# Patient Record
Sex: Female | Born: 1960 | Race: Black or African American | Hispanic: No | Marital: Married | Smoking: Former smoker
Health system: Southern US, Community
[De-identification: ages and names within clinical notes are randomized; demographics above are authoritative.]

## PROBLEM LIST (undated history)

## (undated) DIAGNOSIS — K2270 Barrett's esophagus without dysplasia: Secondary | ICD-10-CM

## (undated) DIAGNOSIS — E039 Hypothyroidism, unspecified: Secondary | ICD-10-CM

## (undated) DIAGNOSIS — F329 Major depressive disorder, single episode, unspecified: Secondary | ICD-10-CM

## (undated) DIAGNOSIS — F32A Depression, unspecified: Secondary | ICD-10-CM

## (undated) DIAGNOSIS — K219 Gastro-esophageal reflux disease without esophagitis: Secondary | ICD-10-CM

## (undated) DIAGNOSIS — I10 Essential (primary) hypertension: Secondary | ICD-10-CM

## (undated) DIAGNOSIS — N938 Other specified abnormal uterine and vaginal bleeding: Secondary | ICD-10-CM

## (undated) DIAGNOSIS — D649 Anemia, unspecified: Secondary | ICD-10-CM

## (undated) DIAGNOSIS — E0500 Thyrotoxicosis with diffuse goiter without thyrotoxic crisis or storm: Secondary | ICD-10-CM

## (undated) HISTORY — DX: Other specified abnormal uterine and vaginal bleeding: N93.8

## (undated) HISTORY — DX: Major depressive disorder, single episode, unspecified: F32.9

## (undated) HISTORY — DX: Thyrotoxicosis with diffuse goiter without thyrotoxic crisis or storm: E05.00

## (undated) HISTORY — DX: Essential (primary) hypertension: I10

## (undated) HISTORY — DX: Hypothyroidism, unspecified: E03.9

## (undated) HISTORY — DX: Anemia, unspecified: D64.9

## (undated) HISTORY — PX: COLONOSCOPY: SHX174

## (undated) HISTORY — PX: UPPER GASTROINTESTINAL ENDOSCOPY: SHX188

## (undated) HISTORY — DX: Depression, unspecified: F32.A

## (undated) HISTORY — PX: FOOT SURGERY: SHX648

## (undated) HISTORY — DX: Barrett's esophagus without dysplasia: K22.70

## (undated) HISTORY — DX: Gastro-esophageal reflux disease without esophagitis: K21.9

## (undated) HISTORY — PX: OTHER SURGICAL HISTORY: SHX169

---

## 1998-12-19 ENCOUNTER — Encounter: Payer: Self-pay | Admitting: Obstetrics and Gynecology

## 1998-12-19 ENCOUNTER — Encounter: Admission: RE | Admit: 1998-12-19 | Discharge: 1998-12-19 | Payer: Self-pay | Admitting: Obstetrics and Gynecology

## 1999-09-17 ENCOUNTER — Encounter: Admission: RE | Admit: 1999-09-17 | Discharge: 1999-09-17 | Payer: Self-pay | Admitting: Family Medicine

## 1999-09-17 ENCOUNTER — Encounter: Payer: Self-pay | Admitting: Family Medicine

## 1999-12-20 ENCOUNTER — Encounter: Payer: Self-pay | Admitting: Obstetrics and Gynecology

## 1999-12-20 ENCOUNTER — Encounter: Admission: RE | Admit: 1999-12-20 | Discharge: 1999-12-20 | Payer: Self-pay | Admitting: Obstetrics and Gynecology

## 2000-12-23 ENCOUNTER — Encounter: Admission: RE | Admit: 2000-12-23 | Discharge: 2000-12-23 | Payer: Self-pay | Admitting: Obstetrics and Gynecology

## 2000-12-23 ENCOUNTER — Encounter: Payer: Self-pay | Admitting: Obstetrics and Gynecology

## 2001-12-24 ENCOUNTER — Encounter: Admission: RE | Admit: 2001-12-24 | Discharge: 2001-12-24 | Payer: Self-pay | Admitting: Obstetrics and Gynecology

## 2001-12-24 ENCOUNTER — Encounter: Payer: Self-pay | Admitting: Obstetrics and Gynecology

## 2002-06-03 ENCOUNTER — Inpatient Hospital Stay (HOSPITAL_COMMUNITY): Admission: EM | Admit: 2002-06-03 | Discharge: 2002-06-06 | Payer: Self-pay | Admitting: Psychiatry

## 2003-01-19 ENCOUNTER — Encounter: Admission: RE | Admit: 2003-01-19 | Discharge: 2003-01-19 | Payer: Self-pay | Admitting: Obstetrics and Gynecology

## 2004-02-13 ENCOUNTER — Encounter: Admission: RE | Admit: 2004-02-13 | Discharge: 2004-02-13 | Payer: Self-pay | Admitting: Obstetrics and Gynecology

## 2004-02-23 ENCOUNTER — Ambulatory Visit: Payer: Self-pay | Admitting: Family Medicine

## 2004-05-15 ENCOUNTER — Ambulatory Visit: Payer: Self-pay | Admitting: Family Medicine

## 2004-09-14 ENCOUNTER — Ambulatory Visit: Payer: Self-pay | Admitting: Family Medicine

## 2004-09-21 ENCOUNTER — Encounter: Admission: RE | Admit: 2004-09-21 | Discharge: 2004-09-21 | Payer: Self-pay | Admitting: Family Medicine

## 2005-01-10 ENCOUNTER — Ambulatory Visit: Payer: Self-pay | Admitting: Family Medicine

## 2005-02-07 ENCOUNTER — Ambulatory Visit: Payer: Self-pay | Admitting: Family Medicine

## 2005-03-11 ENCOUNTER — Ambulatory Visit: Payer: Self-pay | Admitting: Family Medicine

## 2005-03-18 ENCOUNTER — Ambulatory Visit: Payer: Self-pay | Admitting: Family Medicine

## 2005-05-27 ENCOUNTER — Ambulatory Visit: Payer: Self-pay | Admitting: Family Medicine

## 2005-09-09 ENCOUNTER — Ambulatory Visit: Payer: Self-pay | Admitting: Family Medicine

## 2005-09-30 ENCOUNTER — Encounter: Admission: RE | Admit: 2005-09-30 | Discharge: 2005-09-30 | Payer: Self-pay | Admitting: Obstetrics and Gynecology

## 2006-02-01 ENCOUNTER — Emergency Department (HOSPITAL_COMMUNITY): Admission: EM | Admit: 2006-02-01 | Discharge: 2006-02-01 | Payer: Self-pay | Admitting: Emergency Medicine

## 2006-05-19 ENCOUNTER — Ambulatory Visit: Payer: Self-pay | Admitting: Family Medicine

## 2006-05-19 LAB — CONVERTED CEMR LAB
ALT: 15 units/L (ref 0–40)
AST: 20 units/L (ref 0–37)
Alkaline Phosphatase: 79 units/L (ref 39–117)
BUN: 13 mg/dL (ref 6–23)
Basophils Relative: 0 % (ref 0.0–1.0)
Bilirubin, Direct: 0.1 mg/dL (ref 0.0–0.3)
CO2: 27 meq/L (ref 19–32)
Calcium: 9 mg/dL (ref 8.4–10.5)
Chloride: 108 meq/L (ref 96–112)
Eosinophils Relative: 0.3 % (ref 0.0–5.0)
GFR calc Af Amer: 99 mL/min
Glucose, Bld: 82 mg/dL (ref 70–99)
HDL: 49.1 mg/dL (ref >39.0)
LDL Cholesterol: 94 mg/dL (ref 0–99)
Lymphocytes Relative: 38.5 % (ref 12.0–46.0)
Monocytes Relative: 9.9 % (ref 3.0–11.0)
Platelets: 274 10*3/uL (ref 150–400)
RBC: 3.48 M/uL — ABNORMAL LOW (ref 3.87–5.11)
RDW: 12.9 % (ref 11.5–14.6)
Total CHOL/HDL Ratio: 3.1
Total Protein: 7.4 g/dL (ref 6.0–8.3)
Triglycerides: 47 mg/dL (ref 0–149)
VLDL: 9 mg/dL (ref 0–40)
WBC: 4.4 10*3/uL — ABNORMAL LOW (ref 4.5–10.5)

## 2006-07-07 ENCOUNTER — Ambulatory Visit: Payer: Self-pay | Admitting: Family Medicine

## 2006-07-07 LAB — CONVERTED CEMR LAB
Basophils Absolute: 0 10*3/uL (ref 0.0–0.1)
Eosinophils Absolute: 0 10*3/uL (ref 0.0–0.6)
HCT: 31.5 % — ABNORMAL LOW (ref 36.0–46.0)
Hemoglobin: 10.6 g/dL — ABNORMAL LOW (ref 12.0–15.0)
MCHC: 33.5 g/dL (ref 30.0–36.0)
MCV: 93 fL (ref 78.0–100.0)
Monocytes Absolute: 0.3 10*3/uL (ref 0.2–0.7)
Monocytes Relative: 8 % (ref 3.0–11.0)
Neutrophils Relative %: 45.2 % (ref 43.0–77.0)
RBC: 3.38 M/uL — ABNORMAL LOW (ref 3.87–5.11)
RDW: 12 % (ref 11.5–14.6)

## 2006-07-29 ENCOUNTER — Ambulatory Visit: Payer: Self-pay | Admitting: Hematology & Oncology

## 2006-08-15 LAB — CBC & DIFF AND RETIC
Basophils Absolute: 0 10*3/uL (ref 0.0–0.1)
Eosinophils Absolute: 0 10*3/uL (ref 0.0–0.5)
HCT: 30 % — ABNORMAL LOW (ref 34.8–46.6)
HGB: 10.1 g/dL — ABNORMAL LOW (ref 11.6–15.9)
IRF: 0.39 — ABNORMAL HIGH (ref 0.130–0.330)
NEUT#: 2 10*3/uL (ref 1.5–6.5)
NEUT%: 55.5 % (ref 39.6–76.8)
RDW: 12.6 % (ref 11.3–14.5)
RETIC #: 32.7 10*3/uL (ref 19.7–115.1)
lymph#: 1.2 10*3/uL (ref 0.9–3.3)

## 2006-08-15 LAB — CHCC SMEAR

## 2006-08-18 LAB — COMPREHENSIVE METABOLIC PANEL
ALT: 14 U/L (ref 0–35)
AST: 17 U/L (ref 0–37)
Albumin: 4.2 g/dL (ref 3.5–5.2)
Calcium: 9 mg/dL (ref 8.4–10.5)
Chloride: 103 mEq/L (ref 96–112)
Potassium: 3.5 mEq/L (ref 3.5–5.3)
Sodium: 138 mEq/L (ref 135–145)
Total Protein: 7.2 g/dL (ref 6.0–8.3)

## 2006-08-18 LAB — FERRITIN: Ferritin: 241 ng/mL (ref 10–291)

## 2006-08-18 LAB — TRANSFERRIN RECEPTOR, SOLUABLE: Transferrin Receptor, Soluble: 17.2 nmol/L

## 2006-08-26 ENCOUNTER — Telehealth (INDEPENDENT_AMBULATORY_CARE_PROVIDER_SITE_OTHER): Payer: Self-pay | Admitting: *Deleted

## 2006-09-03 ENCOUNTER — Ambulatory Visit: Payer: Self-pay | Admitting: Hematology & Oncology

## 2006-09-05 LAB — CBC WITH DIFFERENTIAL/PLATELET
BASO%: 0.8 % (ref 0.0–2.0)
Eosinophils Absolute: 0 10*3/uL (ref 0.0–0.5)
MCHC: 32.5 g/dL (ref 32.0–36.0)
MONO#: 0.4 10*3/uL (ref 0.1–0.9)
NEUT#: 1.9 10*3/uL (ref 1.5–6.5)
RBC: 3.83 10*6/uL (ref 3.70–5.32)
RDW: 15.7 % — ABNORMAL HIGH (ref 11.3–14.5)
WBC: 3.7 10*3/uL — ABNORMAL LOW (ref 3.9–10.0)
lymph#: 1.4 10*3/uL (ref 0.9–3.3)

## 2006-09-24 LAB — CBC & DIFF AND RETIC
Basophils Absolute: 0 10*3/uL (ref 0.0–0.1)
EOS%: 1.3 % (ref 0.0–7.0)
Eosinophils Absolute: 0 10*3/uL (ref 0.0–0.5)
HGB: 14.1 g/dL (ref 11.6–15.9)
IRF: 0.3 (ref 0.130–0.330)
MCH: 31.6 pg (ref 26.0–34.0)
MONO#: 0.4 10*3/uL (ref 0.1–0.9)
NEUT#: 1.3 10*3/uL — ABNORMAL LOW (ref 1.5–6.5)
RDW: 15.1 % — ABNORMAL HIGH (ref 11.3–14.5)
RETIC #: 40.2 10*3/uL (ref 19.7–115.1)
WBC: 3.2 10*3/uL — ABNORMAL LOW (ref 3.9–10.0)
lymph#: 1.5 10*3/uL (ref 0.9–3.3)

## 2006-09-24 LAB — CHCC SMEAR

## 2006-10-03 ENCOUNTER — Encounter: Admission: RE | Admit: 2006-10-03 | Discharge: 2006-10-03 | Payer: Self-pay | Admitting: Obstetrics and Gynecology

## 2006-12-09 ENCOUNTER — Ambulatory Visit: Payer: Self-pay | Admitting: Hematology & Oncology

## 2006-12-11 LAB — CBC WITH DIFFERENTIAL/PLATELET
BASO%: 0.4 % (ref 0.0–2.0)
Basophils Absolute: 0 10*3/uL (ref 0.0–0.1)
EOS%: 0.5 % (ref 0.0–7.0)
HGB: 10.1 g/dL — ABNORMAL LOW (ref 11.6–15.9)
MCH: 31.2 pg (ref 26.0–34.0)
RDW: 14.7 % — ABNORMAL HIGH (ref 11.3–14.5)
WBC: 3.7 10*3/uL — ABNORMAL LOW (ref 3.9–10.0)
lymph#: 1.2 10*3/uL (ref 0.9–3.3)

## 2006-12-12 LAB — FERRITIN: Ferritin: 249 ng/mL (ref 10–291)

## 2006-12-12 LAB — ERYTHROPOIETIN: Erythropoietin: 23.1 m[IU]/mL (ref 2.6–34.0)

## 2006-12-31 LAB — CBC WITH DIFFERENTIAL/PLATELET
Eosinophils Absolute: 0.1 10*3/uL (ref 0.0–0.5)
MONO#: 0.4 10*3/uL (ref 0.1–0.9)
MONO%: 14.6 % — ABNORMAL HIGH (ref 0.0–13.0)
NEUT#: 1.1 10*3/uL — ABNORMAL LOW (ref 1.5–6.5)
RBC: 3.94 10*6/uL (ref 3.70–5.32)
RDW: 15.7 % — ABNORMAL HIGH (ref 11.3–14.5)
WBC: 3.1 10*3/uL — ABNORMAL LOW (ref 3.9–10.0)

## 2007-01-20 ENCOUNTER — Ambulatory Visit: Payer: Self-pay | Admitting: Hematology & Oncology

## 2007-01-22 LAB — CHCC SMEAR

## 2007-01-22 LAB — CBC WITH DIFFERENTIAL/PLATELET
Eosinophils Absolute: 0.1 10*3/uL (ref 0.0–0.5)
LYMPH%: 53.8 % — ABNORMAL HIGH (ref 14.0–48.0)
MONO#: 0.4 10*3/uL (ref 0.1–0.9)
NEUT#: 1.2 10*3/uL — ABNORMAL LOW (ref 1.5–6.5)
Platelets: 252 10*3/uL (ref 145–400)
RBC: 4.19 10*6/uL (ref 3.70–5.32)
WBC: 3.6 10*3/uL — ABNORMAL LOW (ref 3.9–10.0)

## 2007-01-25 LAB — TRANSFERRIN RECEPTOR, SOLUABLE: Transferrin Receptor, Soluble: 25.5 nmol/L

## 2007-01-25 LAB — FERRITIN: Ferritin: 251 ng/mL (ref 10–291)

## 2007-02-19 LAB — CBC WITH DIFFERENTIAL/PLATELET
BASO%: 2 % (ref 0.0–2.0)
MCHC: 33.3 g/dL (ref 32.0–36.0)
MONO#: 0.3 10*3/uL (ref 0.1–0.9)
RBC: 3.44 10*6/uL — ABNORMAL LOW (ref 3.70–5.32)
RDW: 13.3 % (ref 11.3–14.5)
WBC: 3.8 10*3/uL — ABNORMAL LOW (ref 3.9–10.0)
lymph#: 1.8 10*3/uL (ref 0.9–3.3)

## 2007-03-16 ENCOUNTER — Ambulatory Visit: Payer: Self-pay | Admitting: Hematology & Oncology

## 2007-03-19 LAB — CBC WITH DIFFERENTIAL/PLATELET
Basophils Absolute: 0 10*3/uL (ref 0.0–0.1)
Eosinophils Absolute: 0 10*3/uL (ref 0.0–0.5)
HGB: 12.3 g/dL (ref 11.6–15.9)
LYMPH%: 49.1 % — ABNORMAL HIGH (ref 14.0–48.0)
MCV: 96.5 fL (ref 81.0–101.0)
MONO#: 0.2 10*3/uL (ref 0.1–0.9)
NEUT#: 1.6 10*3/uL (ref 1.5–6.5)
Platelets: 260 10*3/uL (ref 145–400)
RBC: 3.87 10*6/uL (ref 3.70–5.32)
RDW: 15.5 % — ABNORMAL HIGH (ref 11.3–14.5)
WBC: 3.8 10*3/uL — ABNORMAL LOW (ref 3.9–10.0)

## 2007-03-19 LAB — FERRITIN: Ferritin: 295 ng/mL — ABNORMAL HIGH (ref 10–291)

## 2007-03-30 ENCOUNTER — Ambulatory Visit: Payer: Self-pay | Admitting: Family Medicine

## 2007-03-30 LAB — CONVERTED CEMR LAB
ALT: 20 units/L (ref 0–35)
Basophils Relative: 1.3 % — ABNORMAL HIGH (ref 0.0–1.0)
Bilirubin, Direct: 0.2 mg/dL (ref 0.0–0.3)
Blood in Urine, dipstick: NEGATIVE
CO2: 27 meq/L (ref 19–32)
Calcium: 9.1 mg/dL (ref 8.4–10.5)
Eosinophils Absolute: 0 10*3/uL (ref 0.0–0.6)
Eosinophils Relative: 1.7 % (ref 0.0–5.0)
GFR calc Af Amer: 76 mL/min
Glucose, Bld: 79 mg/dL (ref 70–99)
Glucose, Urine, Semiquant: NEGATIVE
HCT: 34.9 % — ABNORMAL LOW (ref 36.0–46.0)
Hemoglobin: 11 g/dL — ABNORMAL LOW (ref 12.0–15.0)
Lymphocytes Relative: 60.7 % — ABNORMAL HIGH (ref 12.0–46.0)
MCV: 96.5 fL (ref 78.0–100.0)
Monocytes Absolute: 0.3 10*3/uL (ref 0.2–0.7)
Neutro Abs: 0.7 10*3/uL — ABNORMAL LOW (ref 1.4–7.7)
Neutrophils Relative %: 25.9 % — ABNORMAL LOW (ref 43.0–77.0)
Nitrite: NEGATIVE
Potassium: 4.3 meq/L (ref 3.5–5.1)
Sodium: 138 meq/L (ref 135–145)
Specific Gravity, Urine: 1.015
TSH: 1.67 microintl units/mL (ref 0.35–5.50)
Total Protein: 6.7 g/dL (ref 6.0–8.3)
VLDL: 10 mg/dL (ref 0–40)
WBC Urine, dipstick: NEGATIVE
WBC: 2.8 10*3/uL — ABNORMAL LOW (ref 4.5–10.5)
pH: 7

## 2007-05-17 ENCOUNTER — Ambulatory Visit: Payer: Self-pay | Admitting: Hematology & Oncology

## 2007-05-20 LAB — CBC & DIFF AND RETIC
BASO%: 0.6 % (ref 0.0–2.0)
Eosinophils Absolute: 0 10*3/uL (ref 0.0–0.5)
MCHC: 33.7 g/dL (ref 32.0–36.0)
MCV: 92.3 fL (ref 81.0–101.0)
MONO%: 7.5 % (ref 0.0–13.0)
NEUT#: 1.6 10*3/uL (ref 1.5–6.5)
RBC: 3.25 10*6/uL — ABNORMAL LOW (ref 3.70–5.32)
RDW: 13 % (ref 11.3–14.5)
RETIC #: 42.3 10*3/uL (ref 19.7–115.1)
Retic %: 1.3 % (ref 0.4–2.3)
WBC: 3 10*3/uL — ABNORMAL LOW (ref 3.9–10.0)

## 2007-05-22 ENCOUNTER — Ambulatory Visit: Payer: Self-pay | Admitting: Family Medicine

## 2007-05-22 DIAGNOSIS — D649 Anemia, unspecified: Secondary | ICD-10-CM | POA: Insufficient documentation

## 2007-05-22 DIAGNOSIS — D509 Iron deficiency anemia, unspecified: Secondary | ICD-10-CM | POA: Insufficient documentation

## 2007-05-22 DIAGNOSIS — D72819 Decreased white blood cell count, unspecified: Secondary | ICD-10-CM | POA: Insufficient documentation

## 2007-05-22 DIAGNOSIS — F329 Major depressive disorder, single episode, unspecified: Secondary | ICD-10-CM | POA: Insufficient documentation

## 2007-05-22 DIAGNOSIS — F32A Depression, unspecified: Secondary | ICD-10-CM | POA: Insufficient documentation

## 2007-05-22 DIAGNOSIS — E039 Hypothyroidism, unspecified: Secondary | ICD-10-CM | POA: Insufficient documentation

## 2007-05-22 DIAGNOSIS — F3341 Major depressive disorder, recurrent, in partial remission: Secondary | ICD-10-CM | POA: Insufficient documentation

## 2007-05-22 LAB — TRANSFERRIN RECEPTOR, SOLUABLE: Transferrin Receptor, Soluble: 19.3 nmol/L

## 2007-08-18 ENCOUNTER — Ambulatory Visit: Payer: Self-pay | Admitting: Hematology & Oncology

## 2007-08-19 LAB — CBC WITH DIFFERENTIAL (CANCER CENTER ONLY)
BASO#: 0 10*3/uL (ref 0.0–0.2)
EOS%: 1.5 % (ref 0.0–7.0)
HCT: 31.2 % — ABNORMAL LOW (ref 34.8–46.6)
HGB: 10.1 g/dL — ABNORMAL LOW (ref 11.6–15.9)
LYMPH%: 50.1 % — ABNORMAL HIGH (ref 14.0–48.0)
MCH: 30 pg (ref 26.0–34.0)
MCHC: 32.2 g/dL (ref 32.0–36.0)
MONO%: 15 % — ABNORMAL HIGH (ref 0.0–13.0)
NEUT%: 32.8 % — ABNORMAL LOW (ref 39.6–80.0)

## 2007-10-20 ENCOUNTER — Ambulatory Visit: Payer: Self-pay | Admitting: Hematology & Oncology

## 2007-10-21 LAB — RETICULOCYTES (CHCC)
ABS Retic: 27.5 10*3/uL (ref 19.0–186.0)
RBC.: 3.44 MIL/uL — ABNORMAL LOW (ref 3.87–5.11)

## 2007-10-21 LAB — CBC WITH DIFFERENTIAL (CANCER CENTER ONLY)
BASO#: 0 10*3/uL (ref 0.0–0.2)
HCT: 31.3 % — ABNORMAL LOW (ref 34.8–46.6)
HGB: 10.4 g/dL — ABNORMAL LOW (ref 11.6–15.9)
LYMPH#: 1.4 10*3/uL (ref 0.9–3.3)
LYMPH%: 46.6 % (ref 14.0–48.0)
MCHC: 33.2 g/dL (ref 32.0–36.0)
MCV: 89 fL (ref 81–101)
MONO#: 0.3 10*3/uL (ref 0.1–0.9)
NEUT%: 41.4 % (ref 39.6–80.0)

## 2007-10-21 LAB — FERRITIN: Ferritin: 306 ng/mL — ABNORMAL HIGH (ref 10–291)

## 2007-10-22 ENCOUNTER — Encounter: Payer: Self-pay | Admitting: Family Medicine

## 2007-10-22 ENCOUNTER — Ambulatory Visit: Payer: Self-pay | Admitting: Family Medicine

## 2007-10-22 ENCOUNTER — Other Ambulatory Visit: Admission: RE | Admit: 2007-10-22 | Discharge: 2007-10-22 | Payer: Self-pay | Admitting: Family Medicine

## 2007-10-22 LAB — CONVERTED CEMR LAB
ALT: 14 units/L (ref 0–35)
BUN: 8 mg/dL (ref 6–23)
Basophils Relative: 0.9 % (ref 0.0–3.0)
Bilirubin Urine: NEGATIVE
CO2: 27 meq/L (ref 19–32)
Calcium: 8.9 mg/dL (ref 8.4–10.5)
Cholesterol: 146 mg/dL (ref 0–200)
Creatinine, Ser: 0.9 mg/dL (ref 0.4–1.2)
Glucose, Bld: 52 mg/dL — ABNORMAL LOW (ref 70–99)
Hemoglobin: 10.6 g/dL — ABNORMAL LOW (ref 12.0–15.0)
LDL Cholesterol: 100 mg/dL — ABNORMAL HIGH (ref 0–99)
Lymphocytes Relative: 45.1 % (ref 12.0–46.0)
Monocytes Relative: 14.4 % — ABNORMAL HIGH (ref 3.0–12.0)
Neutro Abs: 0.8 10*3/uL — ABNORMAL LOW (ref 1.4–7.7)
Nitrite: NEGATIVE
RBC: 3.38 M/uL — ABNORMAL LOW (ref 3.87–5.11)
Specific Gravity, Urine: 1.01
TSH: 0.89 microintl units/mL (ref 0.35–5.50)
Total CHOL/HDL Ratio: 4.2
Total Protein: 7.2 g/dL (ref 6.0–8.3)
Urobilinogen, UA: 1

## 2007-10-26 ENCOUNTER — Encounter: Admission: RE | Admit: 2007-10-26 | Discharge: 2007-10-26 | Payer: Self-pay | Admitting: Family Medicine

## 2007-10-28 ENCOUNTER — Telehealth: Payer: Self-pay | Admitting: *Deleted

## 2007-10-30 ENCOUNTER — Telehealth: Payer: Self-pay | Admitting: Family Medicine

## 2007-12-23 ENCOUNTER — Ambulatory Visit: Payer: Self-pay | Admitting: Hematology & Oncology

## 2007-12-24 LAB — CBC WITH DIFFERENTIAL (CANCER CENTER ONLY)
BASO%: 0.5 % (ref 0.0–2.0)
Eosinophils Absolute: 0 10*3/uL (ref 0.0–0.5)
HCT: 30.2 % — ABNORMAL LOW (ref 34.8–46.6)
HGB: 10.2 g/dL — ABNORMAL LOW (ref 11.6–15.9)
LYMPH#: 0.8 10*3/uL — ABNORMAL LOW (ref 0.9–3.3)
MONO#: 0.3 10*3/uL (ref 0.1–0.9)
NEUT%: 51.6 % (ref 39.6–80.0)
RBC: 3.3 10*6/uL — ABNORMAL LOW (ref 3.70–5.32)
RDW: 10.5 % (ref 10.5–14.6)
WBC: 2.3 10*3/uL — ABNORMAL LOW (ref 3.9–10.0)

## 2007-12-24 LAB — CHCC SATELLITE - SMEAR

## 2007-12-27 LAB — ERYTHROPOIETIN: Erythropoietin: 9.2 m[IU]/mL (ref 2.6–34.0)

## 2007-12-27 LAB — FERRITIN: Ferritin: 406 ng/mL — ABNORMAL HIGH (ref 10–291)

## 2008-01-13 LAB — CBC WITH DIFFERENTIAL (CANCER CENTER ONLY)
BASO#: 0 10*3/uL (ref 0.0–0.2)
EOS%: 2.1 % (ref 0.0–7.0)
Eosinophils Absolute: 0.1 10*3/uL (ref 0.0–0.5)
HGB: 13.6 g/dL (ref 11.6–15.9)
LYMPH%: 38.9 % (ref 14.0–48.0)
MCH: 31.8 pg (ref 26.0–34.0)
MCHC: 32.5 g/dL (ref 32.0–36.0)
MCV: 98 fL (ref 81–101)
MONO%: 13.9 % — ABNORMAL HIGH (ref 0.0–13.0)
NEUT#: 1 10*3/uL — ABNORMAL LOW (ref 1.5–6.5)
Platelets: 237 10*3/uL (ref 145–400)
RBC: 4.27 10*6/uL (ref 3.70–5.32)

## 2008-02-04 LAB — CBC WITH DIFFERENTIAL (CANCER CENTER ONLY)
BASO%: 0.3 % (ref 0.0–2.0)
Eosinophils Absolute: 0.1 10*3/uL (ref 0.0–0.5)
LYMPH#: 1.3 10*3/uL (ref 0.9–3.3)
LYMPH%: 30.6 % (ref 14.0–48.0)
MONO#: 0.3 10*3/uL (ref 0.1–0.9)
NEUT#: 2.5 10*3/uL (ref 1.5–6.5)
Platelets: 211 10*3/uL (ref 145–400)
RBC: 4.17 10*6/uL (ref 3.70–5.32)
RDW: 10.8 % (ref 10.5–14.6)
WBC: 4.2 10*3/uL (ref 3.9–10.0)

## 2008-02-08 LAB — TRANSFERRIN RECEPTOR, SOLUABLE: Transferrin Receptor, Soluble: 20.2 nmol/L

## 2008-02-08 LAB — FERRITIN: Ferritin: 800 ng/mL — ABNORMAL HIGH (ref 10–291)

## 2008-02-22 ENCOUNTER — Ambulatory Visit: Payer: Self-pay | Admitting: Hematology & Oncology

## 2008-03-21 ENCOUNTER — Ambulatory Visit: Payer: Self-pay | Admitting: Family Medicine

## 2008-03-21 DIAGNOSIS — H9319 Tinnitus, unspecified ear: Secondary | ICD-10-CM | POA: Insufficient documentation

## 2008-04-07 LAB — CBC WITH DIFFERENTIAL (CANCER CENTER ONLY)
BASO#: 0 10*3/uL (ref 0.0–0.2)
EOS%: 1.8 % (ref 0.0–7.0)
Eosinophils Absolute: 0.1 10*3/uL (ref 0.0–0.5)
HCT: 31.7 % — ABNORMAL LOW (ref 34.8–46.6)
HGB: 10.4 g/dL — ABNORMAL LOW (ref 11.6–15.9)
MCH: 30.3 pg (ref 26.0–34.0)
MCHC: 32.7 g/dL (ref 32.0–36.0)
MONO%: 5.8 % (ref 0.0–13.0)
NEUT%: 50.5 % (ref 39.6–80.0)
RBC: 3.41 10*6/uL — ABNORMAL LOW (ref 3.70–5.32)

## 2008-04-07 LAB — RETICULOCYTES (CHCC)
RBC.: 3.43 MIL/uL — ABNORMAL LOW (ref 3.87–5.11)
Retic Ct Pct: 1.9 % (ref 0.4–3.1)

## 2008-05-10 ENCOUNTER — Ambulatory Visit: Payer: Self-pay | Admitting: Hematology & Oncology

## 2008-05-18 LAB — CBC WITH DIFFERENTIAL (CANCER CENTER ONLY)
BASO#: 0 10*3/uL (ref 0.0–0.2)
MCH: 30.2 pg (ref 26.0–34.0)
MCHC: 31.5 g/dL — ABNORMAL LOW (ref 32.0–36.0)
MCV: 96 fL (ref 81–101)
NEUT%: 45.7 % (ref 39.6–80.0)
Platelets: 196 10*3/uL (ref 145–400)
RDW: 10.8 % (ref 10.5–14.6)

## 2008-06-15 LAB — CBC WITH DIFFERENTIAL (CANCER CENTER ONLY)
BASO#: 0 10*3/uL (ref 0.0–0.2)
EOS%: 0.9 % (ref 0.0–7.0)
Eosinophils Absolute: 0 10*3/uL (ref 0.0–0.5)
HGB: 11.6 g/dL (ref 11.6–15.9)
LYMPH%: 27.3 % (ref 14.0–48.0)
MCH: 30.3 pg (ref 26.0–34.0)
MCHC: 33.1 g/dL (ref 32.0–36.0)
MCV: 92 fL (ref 81–101)
MONO%: 4.4 % (ref 0.0–13.0)
RBC: 3.82 10*6/uL (ref 3.70–5.32)

## 2008-06-18 LAB — TRANSFERRIN RECEPTOR, SOLUABLE: Transferrin Receptor, Soluble: 13.7 nmol/L

## 2008-06-18 LAB — RETICULOCYTES (CHCC)
ABS Retic: 38.6 10*3/uL (ref 19.0–186.0)
RBC.: 3.86 MIL/uL — ABNORMAL LOW (ref 3.87–5.11)

## 2008-08-22 ENCOUNTER — Ambulatory Visit: Payer: Self-pay | Admitting: Family Medicine

## 2008-08-22 DIAGNOSIS — B351 Tinea unguium: Secondary | ICD-10-CM | POA: Insufficient documentation

## 2008-08-23 ENCOUNTER — Ambulatory Visit: Payer: Self-pay | Admitting: Hematology & Oncology

## 2008-08-24 LAB — CBC WITH DIFFERENTIAL (CANCER CENTER ONLY)
BASO#: 0 10*3/uL (ref 0.0–0.2)
Eosinophils Absolute: 0.1 10*3/uL (ref 0.0–0.5)
HCT: 30.8 % — ABNORMAL LOW (ref 34.8–46.6)
HGB: 9.8 g/dL — ABNORMAL LOW (ref 11.6–15.9)
MCH: 29.9 pg (ref 26.0–34.0)
MCHC: 31.9 g/dL — ABNORMAL LOW (ref 32.0–36.0)
MONO%: 7.3 % (ref 0.0–13.0)
NEUT#: 1.6 10*3/uL (ref 1.5–6.5)
NEUT%: 49.7 % (ref 39.6–80.0)
RBC: 3.29 10*6/uL — ABNORMAL LOW (ref 3.70–5.32)

## 2008-08-24 LAB — RETICULOCYTES (CHCC): Retic Ct Pct: 1.4 % (ref 0.4–3.1)

## 2008-08-24 LAB — CHCC SATELLITE - SMEAR

## 2008-08-24 LAB — FERRITIN: Ferritin: 774 ng/mL — ABNORMAL HIGH (ref 10–291)

## 2008-10-21 ENCOUNTER — Ambulatory Visit: Payer: Self-pay | Admitting: Family Medicine

## 2008-10-21 LAB — CONVERTED CEMR LAB
ALT: 29 units/L (ref 0–35)
AST: 23 units/L (ref 0–37)
Albumin: 3.8 g/dL (ref 3.5–5.2)
Basophils Relative: 0.2 % (ref 0.0–3.0)
Eosinophils Relative: 0.5 % (ref 0.0–5.0)
GFR calc non Af Amer: 75.65 mL/min (ref >60)
Glucose, Bld: 89 mg/dL (ref 70–99)
Glucose, Urine, Semiquant: NEGATIVE
HCT: 33.6 % — ABNORMAL LOW (ref 36.0–46.0)
Hemoglobin: 11.1 g/dL — ABNORMAL LOW (ref 12.0–15.0)
Lymphs Abs: 1.1 10*3/uL (ref 0.7–4.0)
Monocytes Relative: 8.3 % (ref 3.0–12.0)
Neutro Abs: 2.9 10*3/uL (ref 1.4–7.7)
Potassium: 3.7 meq/L (ref 3.5–5.1)
RBC: 3.54 M/uL — ABNORMAL LOW (ref 3.87–5.11)
RDW: 12.7 % (ref 11.5–14.6)
Sodium: 142 meq/L (ref 135–145)
Specific Gravity, Urine: 1.025
TSH: 1.64 microintl units/mL (ref 0.35–5.50)
Total Protein: 7.3 g/dL (ref 6.0–8.3)
VLDL: 14.8 mg/dL (ref 0.0–40.0)
WBC Urine, dipstick: NEGATIVE
pH: 6.5

## 2008-10-25 ENCOUNTER — Ambulatory Visit: Payer: Self-pay | Admitting: Hematology & Oncology

## 2008-10-26 LAB — FERRITIN: Ferritin: 486 ng/mL — ABNORMAL HIGH (ref 10–291)

## 2008-10-26 LAB — CBC WITH DIFFERENTIAL (CANCER CENTER ONLY)
EOS%: 1.4 % (ref 0.0–7.0)
LYMPH%: 44.6 % (ref 14.0–48.0)
MCH: 30.5 pg (ref 26.0–34.0)
MCHC: 33.4 g/dL (ref 32.0–36.0)
MONO%: 7 % (ref 0.0–13.0)
NEUT#: 1.4 10*3/uL — ABNORMAL LOW (ref 1.5–6.5)
Platelets: 230 10*3/uL (ref 145–400)

## 2008-10-26 LAB — CHCC SATELLITE - SMEAR

## 2008-10-28 ENCOUNTER — Ambulatory Visit: Payer: Self-pay | Admitting: Family Medicine

## 2008-10-28 ENCOUNTER — Encounter: Payer: Self-pay | Admitting: Family Medicine

## 2008-10-28 ENCOUNTER — Other Ambulatory Visit: Admission: RE | Admit: 2008-10-28 | Discharge: 2008-10-28 | Payer: Self-pay | Admitting: Family Medicine

## 2008-11-01 ENCOUNTER — Encounter: Admission: RE | Admit: 2008-11-01 | Discharge: 2008-11-01 | Payer: Self-pay | Admitting: Family Medicine

## 2008-11-24 ENCOUNTER — Ambulatory Visit: Payer: Self-pay | Admitting: Hematology & Oncology

## 2008-11-24 LAB — CBC WITH DIFFERENTIAL (CANCER CENTER ONLY)
BASO#: 0 10*3/uL (ref 0.0–0.2)
EOS%: 1.4 % (ref 0.0–7.0)
HCT: 30.1 % — ABNORMAL LOW (ref 34.8–46.6)
HGB: 10.1 g/dL — ABNORMAL LOW (ref 11.6–15.9)
LYMPH%: 33.8 % (ref 14.0–48.0)
MCH: 30.6 pg (ref 26.0–34.0)
MCHC: 33.7 g/dL (ref 32.0–36.0)
MCV: 91 fL (ref 81–101)
MONO%: 7.1 % (ref 0.0–13.0)
NEUT%: 57.4 % (ref 39.6–80.0)

## 2008-12-05 ENCOUNTER — Telehealth: Payer: Self-pay | Admitting: Family Medicine

## 2008-12-22 LAB — CBC WITH DIFFERENTIAL (CANCER CENTER ONLY)
BASO#: 0 10*3/uL (ref 0.0–0.2)
EOS%: 2.7 % (ref 0.0–7.0)
HCT: 38.3 % (ref 34.8–46.6)
HGB: 12.5 g/dL (ref 11.6–15.9)
LYMPH#: 1.3 10*3/uL (ref 0.9–3.3)
MCHC: 32.7 g/dL (ref 32.0–36.0)
MONO#: 0.3 10*3/uL (ref 0.1–0.9)
NEUT%: 47 % (ref 39.6–80.0)

## 2009-01-18 ENCOUNTER — Ambulatory Visit: Payer: Self-pay | Admitting: Hematology & Oncology

## 2009-02-02 LAB — CBC WITH DIFFERENTIAL (CANCER CENTER ONLY)
BASO#: 0 10*3/uL (ref 0.0–0.2)
BASO%: 0.3 % (ref 0.0–2.0)
HCT: 34.1 % — ABNORMAL LOW (ref 34.8–46.6)
HGB: 11.1 g/dL — ABNORMAL LOW (ref 11.6–15.9)
LYMPH#: 1.3 10*3/uL (ref 0.9–3.3)
MONO#: 0.3 10*3/uL (ref 0.1–0.9)
NEUT%: 47.6 % (ref 39.6–80.0)
RBC: 3.79 10*6/uL (ref 3.70–5.32)
RDW: 10.9 % (ref 10.5–14.6)
WBC: 3.1 10*3/uL — ABNORMAL LOW (ref 3.9–10.0)

## 2009-02-04 HISTORY — PX: COLONOSCOPY: SHX174

## 2009-02-04 LAB — RETICULOCYTES (CHCC)
ABS Retic: 43.1 10*3/uL (ref 19.0–186.0)
RBC.: 3.59 MIL/uL — ABNORMAL LOW (ref 3.87–5.11)

## 2009-02-04 LAB — TRANSFERRIN RECEPTOR, SOLUABLE: Transferrin Receptor, Soluble: 16.1 nmol/L

## 2009-02-04 LAB — FERRITIN: Ferritin: 786 ng/mL — ABNORMAL HIGH (ref 10–291)

## 2009-02-16 ENCOUNTER — Ambulatory Visit: Payer: Self-pay | Admitting: Hematology & Oncology

## 2009-02-16 LAB — CBC WITH DIFFERENTIAL (CANCER CENTER ONLY)
BASO#: 0 10*3/uL (ref 0.0–0.2)
Eosinophils Absolute: 0.1 10*3/uL (ref 0.0–0.5)
HGB: 10.7 g/dL — ABNORMAL LOW (ref 11.6–15.9)
LYMPH#: 1.5 10*3/uL (ref 0.9–3.3)
MONO#: 0.3 10*3/uL (ref 0.1–0.9)
NEUT#: 1.5 10*3/uL (ref 1.5–6.5)
RBC: 3.67 10*6/uL — ABNORMAL LOW (ref 3.70–5.32)
WBC: 3.4 10*3/uL — ABNORMAL LOW (ref 3.9–10.0)

## 2009-03-15 LAB — CBC WITH DIFFERENTIAL (CANCER CENTER ONLY)
HGB: 13 g/dL (ref 11.6–15.9)
MCH: 30 pg (ref 26.0–34.0)
MCV: 92 fL (ref 81–101)
Platelets: 240 10*3/uL (ref 145–400)
RBC: 4.33 10*6/uL (ref 3.70–5.32)
WBC: 3.3 10*3/uL — ABNORMAL LOW (ref 3.9–10.0)

## 2009-03-15 LAB — MANUAL DIFFERENTIAL (CHCC SATELLITE)
BASO: 1 % (ref 0–2)
LYMPH: 50 % — ABNORMAL HIGH (ref 14–48)
MONO: 7 % (ref 0–13)
SEG: 40 % (ref 40–75)

## 2009-04-11 ENCOUNTER — Ambulatory Visit: Payer: Self-pay | Admitting: Hematology & Oncology

## 2009-04-12 LAB — CBC WITH DIFFERENTIAL (CANCER CENTER ONLY)
BASO#: 0 10*3/uL (ref 0.0–0.2)
Eosinophils Absolute: 0.1 10*3/uL (ref 0.0–0.5)
HGB: 11.6 g/dL (ref 11.6–15.9)
LYMPH#: 1.5 10*3/uL (ref 0.9–3.3)
MCH: 29 pg (ref 26.0–34.0)
MONO#: 0.3 10*3/uL (ref 0.1–0.9)
MONO%: 8.5 % (ref 0.0–13.0)
NEUT#: 1.7 10*3/uL (ref 1.5–6.5)
Platelets: 225 10*3/uL (ref 145–400)
RBC: 4.01 10*6/uL (ref 3.70–5.32)
WBC: 3.6 10*3/uL — ABNORMAL LOW (ref 3.9–10.0)

## 2009-05-10 LAB — CBC WITH DIFFERENTIAL (CANCER CENTER ONLY)
BASO%: 0.4 % (ref 0.0–2.0)
Eosinophils Absolute: 0.1 10*3/uL (ref 0.0–0.5)
LYMPH#: 1.4 10*3/uL (ref 0.9–3.3)
MCV: 89 fL (ref 81–101)
MONO#: 0.3 10*3/uL (ref 0.1–0.9)
NEUT#: 2.3 10*3/uL (ref 1.5–6.5)
Platelets: 273 10*3/uL (ref 145–400)
RBC: 3.65 10*6/uL — ABNORMAL LOW (ref 3.70–5.32)
RDW: 11.1 % (ref 10.5–14.6)
WBC: 4.1 10*3/uL (ref 3.9–10.0)

## 2009-05-10 LAB — CHCC SATELLITE - SMEAR

## 2009-05-10 LAB — FERRITIN: Ferritin: 521 ng/mL — ABNORMAL HIGH (ref 10–291)

## 2009-05-16 ENCOUNTER — Ambulatory Visit: Payer: Self-pay | Admitting: Family Medicine

## 2009-06-06 ENCOUNTER — Ambulatory Visit: Payer: Self-pay | Admitting: Hematology & Oncology

## 2009-06-07 LAB — CBC WITH DIFFERENTIAL (CANCER CENTER ONLY)
BASO#: 0 10*3/uL (ref 0.0–0.2)
EOS%: 2 % (ref 0.0–7.0)
Eosinophils Absolute: 0.1 10*3/uL (ref 0.0–0.5)
HCT: 37.6 % (ref 34.8–46.6)
HGB: 12.2 g/dL (ref 11.6–15.9)
LYMPH#: 1.4 10*3/uL (ref 0.9–3.3)
MCH: 29.9 pg (ref 26.0–34.0)
MCHC: 32.4 g/dL (ref 32.0–36.0)
NEUT#: 1.8 10*3/uL (ref 1.5–6.5)
NEUT%: 51.6 % (ref 39.6–80.0)
RBC: 4.07 10*6/uL (ref 3.70–5.32)

## 2009-06-07 LAB — FERRITIN: Ferritin: 474 ng/mL — ABNORMAL HIGH (ref 10–291)

## 2009-06-07 LAB — RETICULOCYTES (CHCC)
RBC.: 4.11 MIL/uL (ref 3.87–5.11)
Retic Ct Pct: 0.7 % (ref 0.4–3.1)

## 2009-07-05 LAB — CBC WITH DIFFERENTIAL (CANCER CENTER ONLY)
BASO#: 0 10*3/uL (ref 0.0–0.2)
BASO%: 0.3 % (ref 0.0–2.0)
HCT: 32.1 % — ABNORMAL LOW (ref 34.8–46.6)
HGB: 10.4 g/dL — ABNORMAL LOW (ref 11.6–15.9)
LYMPH#: 2 10*3/uL (ref 0.9–3.3)
MONO#: 0.3 10*3/uL (ref 0.1–0.9)
NEUT#: 1.8 10*3/uL (ref 1.5–6.5)
NEUT%: 43.5 % (ref 39.6–80.0)
RBC: 3.51 10*6/uL — ABNORMAL LOW (ref 3.70–5.32)
WBC: 4.2 10*3/uL (ref 3.9–10.0)

## 2009-08-04 ENCOUNTER — Ambulatory Visit: Payer: Self-pay | Admitting: Hematology & Oncology

## 2009-08-08 LAB — CBC WITH DIFFERENTIAL (CANCER CENTER ONLY)
BASO%: 0.5 % (ref 0.0–2.0)
EOS%: 1.9 % (ref 0.0–7.0)
HCT: 37.7 % (ref 34.8–46.6)
LYMPH#: 2.4 10*3/uL (ref 0.9–3.3)
MONO#: 0.5 10*3/uL (ref 0.1–0.9)
NEUT#: 2 10*3/uL (ref 1.5–6.5)
Platelets: 216 10*3/uL (ref 145–400)
RDW: 10.9 % (ref 10.5–14.6)
WBC: 5 10*3/uL (ref 3.9–10.0)

## 2009-09-05 ENCOUNTER — Ambulatory Visit: Payer: Self-pay | Admitting: Hematology & Oncology

## 2009-09-07 LAB — CBC WITH DIFFERENTIAL (CANCER CENTER ONLY)
BASO%: 0.5 % (ref 0.0–2.0)
EOS%: 1.8 % (ref 0.0–7.0)
LYMPH%: 41.5 % (ref 14.0–48.0)
MCHC: 32.5 g/dL (ref 32.0–36.0)
MCV: 90 fL (ref 81–101)
MONO#: 0.4 10*3/uL (ref 0.1–0.9)
MONO%: 7.2 % (ref 0.0–13.0)
Platelets: 259 10*3/uL (ref 145–400)
RDW: 11.4 % (ref 10.5–14.6)
WBC: 5.3 10*3/uL (ref 3.9–10.0)

## 2009-10-27 ENCOUNTER — Ambulatory Visit: Payer: Self-pay | Admitting: Family Medicine

## 2009-10-27 LAB — CONVERTED CEMR LAB
AST: 25 units/L (ref 0–37)
Albumin: 4.2 g/dL (ref 3.5–5.2)
Alkaline Phosphatase: 103 units/L (ref 39–117)
Basophils Absolute: 0 10*3/uL (ref 0.0–0.1)
Basophils Relative: 0.3 % (ref 0.0–3.0)
Bilirubin Urine: NEGATIVE
Blood in Urine, dipstick: NEGATIVE
CO2: 27 meq/L (ref 19–32)
GFR calc non Af Amer: 87.32 mL/min (ref >60)
Glucose, Bld: 83 mg/dL (ref 70–99)
Glucose, Urine, Semiquant: NEGATIVE
HCT: 32.7 % — ABNORMAL LOW (ref 36.0–46.0)
Hemoglobin: 10.7 g/dL — ABNORMAL LOW (ref 12.0–15.0)
Ketones, urine, test strip: NEGATIVE
Lymphs Abs: 1.4 10*3/uL (ref 0.7–4.0)
MCHC: 32.8 g/dL (ref 30.0–36.0)
Monocytes Relative: 8.6 % (ref 3.0–12.0)
Neutro Abs: 1.8 10*3/uL (ref 1.4–7.7)
Nitrite: NEGATIVE
Potassium: 3.6 meq/L (ref 3.5–5.1)
Protein, U semiquant: NEGATIVE
RBC: 3.51 M/uL — ABNORMAL LOW (ref 3.87–5.11)
RDW: 13.8 % (ref 11.5–14.6)
Sodium: 141 meq/L (ref 135–145)
Specific Gravity, Urine: 1.025
TSH: 0.56 microintl units/mL (ref 0.35–5.50)
Total CHOL/HDL Ratio: 4
Total Protein: 7.5 g/dL (ref 6.0–8.3)
Urobilinogen, UA: 0.2
WBC Urine, dipstick: NEGATIVE
pH: 7

## 2009-11-02 ENCOUNTER — Other Ambulatory Visit: Admission: RE | Admit: 2009-11-02 | Discharge: 2009-11-02 | Payer: Self-pay | Admitting: Family Medicine

## 2009-11-02 ENCOUNTER — Encounter: Payer: Self-pay | Admitting: Family Medicine

## 2009-11-02 ENCOUNTER — Ambulatory Visit: Payer: Self-pay | Admitting: Family Medicine

## 2009-11-02 LAB — HM PAP SMEAR

## 2009-11-06 ENCOUNTER — Encounter (INDEPENDENT_AMBULATORY_CARE_PROVIDER_SITE_OTHER): Payer: Self-pay | Admitting: *Deleted

## 2009-11-06 ENCOUNTER — Encounter: Admission: RE | Admit: 2009-11-06 | Discharge: 2009-11-06 | Payer: Self-pay | Admitting: Family Medicine

## 2009-11-06 LAB — HM MAMMOGRAPHY

## 2009-11-16 ENCOUNTER — Encounter (INDEPENDENT_AMBULATORY_CARE_PROVIDER_SITE_OTHER): Payer: Self-pay | Admitting: *Deleted

## 2009-11-17 ENCOUNTER — Ambulatory Visit: Payer: Self-pay | Admitting: Gastroenterology

## 2009-11-27 ENCOUNTER — Ambulatory Visit: Payer: Self-pay | Admitting: Gastroenterology

## 2009-11-28 ENCOUNTER — Encounter: Payer: Self-pay | Admitting: Gastroenterology

## 2010-01-08 ENCOUNTER — Ambulatory Visit: Payer: Self-pay | Admitting: Hematology & Oncology

## 2010-01-10 ENCOUNTER — Encounter: Payer: Self-pay | Admitting: Family Medicine

## 2010-01-10 LAB — CBC WITH DIFFERENTIAL (CANCER CENTER ONLY)
BASO#: 0 10*3/uL (ref 0.0–0.2)
EOS%: 1.9 % (ref 0.0–7.0)
Eosinophils Absolute: 0.1 10*3/uL (ref 0.0–0.5)
HGB: 10.1 g/dL — ABNORMAL LOW (ref 11.6–15.9)
LYMPH%: 37.3 % (ref 14.0–48.0)
MCH: 29.3 pg (ref 26.0–34.0)
MCHC: 32.8 g/dL (ref 32.0–36.0)
MCV: 89 fL (ref 81–101)
MONO%: 6.3 % (ref 0.0–13.0)
RBC: 3.43 10*6/uL — ABNORMAL LOW (ref 3.70–5.32)

## 2010-01-12 LAB — FERRITIN: Ferritin: 398 ng/mL — ABNORMAL HIGH (ref 10–291)

## 2010-01-12 LAB — IRON AND TIBC
%SAT: 32 % (ref 20–55)
Iron: 86 ug/dL (ref 42–145)
TIBC: 271 ug/dL (ref 250–470)
UIBC: 185 ug/dL

## 2010-01-12 LAB — TRANSFERRIN RECEPTOR, SOLUABLE: Transferrin Receptor, Soluble: 13.5 nmol/L

## 2010-03-05 ENCOUNTER — Ambulatory Visit (HOSPITAL_BASED_OUTPATIENT_CLINIC_OR_DEPARTMENT_OTHER): Payer: Self-pay | Admitting: Hematology & Oncology

## 2010-03-06 NOTE — Letter (Signed)
Summary: Pre Visit Letter Revised  [facility]  [address] [place], [place] [address]   Phone: [phone]  Fax: [phone]        11/06/2009 MRN: [id number] Karen [name] [address] [place], [place]  [address]             Procedure Date:  11-27-09   Welcome to the Gastroenterology Division at [facility].    You are scheduled to see a nurse for your pre-procedure visit on 11-17-09 at 11:00a.m. on the 3rd floor at [facility], [address]  We ask that you try to arrive at our office 15 minutes prior to your appointment time to allow for check-in.  Please take a minute to review the attached form.  If you answer "Yes" to one or more of the questions on the first page, we ask that you call the person listed at your earliest opportunity.  If you answer "No" to all of the questions, please complete the rest of the form and bring it to your appointment.    Your nurse visit will consist of discussing your medical and surgical history, your immediate family medical history, and your medications.   If you are unable to list all of your medications on the form, please bring the medication bottles to your appointment and we will list them.  We will need to be aware of both prescribed and over the counter drugs.  We will need to know exact dosage information as well.    Please be prepared to read and sign documents such as consent forms, a financial agreement, and acknowledgement forms.  If necessary, and with your consent, a friend or relative is welcome to sit-in on the nurse visit with you.  Please bring your insurance card so that we may make a copy of it.  If your insurance requires a referral to see a specialist, please bring your referral form from your primary care physician.  No co-pay is required for this nurse visit.     If you cannot keep your appointment, please call [phone] to cancel or reschedule prior to your appointment date.  This allows  [place] the opportunity to schedule an appointment for another patient in need of care.    Thank you for choosing [facility] for your medical needs.  We appreciate the opportunity to care for you.  Please visit [place] at our website  to learn more about our practice.  Sincerely, The Gastroenterology Division

## 2010-03-06 NOTE — Letter (Signed)
Summary: [facility] Instructions  [facility]  [address] [place], [place] [address]   Phone: [phone]  Fax: [phone]       Karen [name]    Jan 31, 1961    MRN: [id number]        Procedure Day Dorna Bloom:  Duanne Limerick  11/27/09     Arrival Time:  7:30AM     Procedure Time:  8:30AM     Location of Procedure:                    _ X_  [facility] (4th Floor)                      PREPARATION FOR COLONOSCOPY WITH MOVIPREP   Starting 5 days prior to your procedure 11/22/09 do not eat nuts, seeds, popcorn, corn, beans, peas,  salads, or any raw vegetables.  Do not take any fiber supplements (e.g. Metamucil, Citrucel, and Benefiber).  THE DAY BEFORE YOUR PROCEDURE         DATE: 11/26/09  DAY: SUNDAY  1.  Drink clear liquids the entire day-NO SOLID FOOD  2.  Do not drink anything colored red or purple.  Avoid juices with pulp.  No orange juice.  3.  Drink at least 64 oz. (8 glasses) of fluid/clear liquids during the day to prevent dehydration and help the prep work efficiently.  CLEAR LIQUIDS INCLUDE: Water Jello Ice Popsicles Tea (sugar ok, no milk/cream) Powdered fruit flavored drinks Coffee (sugar ok, no milk/cream) Gatorade Juice: apple, white grape, white cranberry  Lemonade Clear bullion, consomm, broth Carbonated beverages (any kind) Strained chicken noodle soup Hard Candy                             4.  In the morning, mix first dose of MoviPrep solution:    Empty 1 Pouch A and 1 Pouch B into the disposable container    Add lukewarm drinking water to the top line of the container. Mix to dissolve    Refrigerate (mixed solution should be used within 24 hrs)  5.  Begin drinking the prep at 5:00 p.m. The MoviPrep container is divided by 4 marks.   Every 15 minutes drink the solution down to the next mark (approximately 8 oz) until the full liter is complete.   6.  Follow completed prep with 16 oz of clear liquid of your choice (Nothing  red or purple).  Continue to drink clear liquids until bedtime.  7.  Before going to bed, mix second dose of MoviPrep solution:    Empty 1 Pouch A and 1 Pouch B into the disposable container    Add lukewarm drinking water to the top line of the container. Mix to dissolve    Refrigerate  THE DAY OF YOUR PROCEDURE      DATE: 11/27/09  DAY: MONDAY  Beginning at 3:30AM (5 hours before procedure):         1. Every 15 minutes, drink the solution down to the next mark (approx 8 oz) until the full liter is complete.  2. Follow completed prep with 16 oz. of clear liquid of your choice.    3. You may drink clear liquids until 6:30AM (2 HOURS BEFORE PROCEDURE).   MEDICATION INSTRUCTIONS  Unless otherwise instructed, you should take regular prescription medications with a small sip of water   as early as possible the morning of your  procedure.       OTHER INSTRUCTIONS  You will need a responsible adult at least 51 years of age to accompany you and drive you home.   This person must remain in the waiting room during your procedure.  Wear loose fitting clothing that is easily removed.  Leave jewelry and other valuables at home.  However, you may wish to bring a book to read or  an iPod/MP3 player to listen to music as you wait for your procedure to start.  Remove all body piercing jewelry and leave at home.  Total time from sign-in until discharge is approximately 2-3 hours.  You should go home directly after your procedure and rest.  You can resume normal activities the  day after your procedure.  The day of your procedure you should not:   Drive   Make legal decisions   Operate machinery   Drink alcohol   Return to work  You will receive specific instructions about eating, activities and medications before you leave.    The above instructions have been reviewed and explained to me by   Wyona Almas RN  November 17, 2009 11:20 AM     I fully understand and can  verbalize these instructions _____________________________ Date _________

## 2010-03-06 NOTE — Miscellaneous (Signed)
Summary: [facility] Previsit/prep  Clinical Lists Changes  Medications: Added new medication of MOVIPREP 100 GM  SOLR (PEG-KCL-NACL-NASULF-NA ASC-C) As per prep instructions. - Signed Rx of MOVIPREP 100 GM  SOLR (PEG-KCL-NACL-NASULF-NA ASC-C) As per prep instructions.;  #1 x 0;  Signed;  Entered by: Wyona Almas RN;  Authorized by: Meryl Dare MD [facility];  Method used: [facility]  [address] [id number] [address] [place], [place]  [address], Ph: [phone] or [phone], Fax: [phone] Observations: Added new observation of NKA: T (11/17/2009 11:01)    Prescriptions: MOVIPREP 100 GM  SOLR (PEG-KCL-NACL-NASULF-NA ASC-C) As per prep instructions.  #1 x 0   Entered by:   Wyona Almas RN   Authorized by:   Meryl Dare MD [facility]   Signed by:   Wyona Almas RN on 11/17/2009   Method used:   [facility]  [address] [id number]       [address]       [place], [place]  [address]       Ph: [phone] or [phone]       Fax: [phone]   RxID:   [id number]

## 2010-03-06 NOTE — Procedures (Signed)
Summary: Colonoscopy  Patient: Karen [name] Note: All result statuses are Final unless otherwise noted.  Tests: (1) Colonoscopy (COL)   COL Colonoscopy           DONE     [facility]     [address]     [place], [place]  [address]           COLONOSCOPY PROCEDURE REPORT     PATIENT:  Karen [name], Karen [name]  MR#:  [id number]     BIRTHDATE:  [birth year], 49 yrs. old  GENDER:  female     ENDOSCOPIST:  Judie Petit T. Russella Dar, MD, [facility]     Referred by:  Eugenio Hoes [name], M.D.     PROCEDURE DATE:  11/27/2009     PROCEDURE:  Colonoscopy with snare polypectomy     ASA CLASS:  Class II     INDICATIONS:  1) Routine Risk Screening     MEDICATIONS:   Fentanyl 75 mcg IV, Versed 8 mg IV     DESCRIPTION OF PROCEDURE:   After the risks benefits and     alternatives of the procedure were thoroughly explained, informed     consent was obtained.  Digital rectal exam was performed and     revealed no abnormalities.   The LB [id number]-[id number] endoscope     was introduced through the anus and advanced to the cecum, which     was identified by both the appendix and ileocecal valve, without     limitations.  The quality of the prep was good, using MoviPrep.     The instrument was then slowly withdrawn as the colon was fully     examined.     <<PROCEDUREIMAGES>>     FINDINGS:  A sessile polyp was found in the rectum. It was 6 mm in     size. Polyp was snared without cautery. Retrieval was successful.     A normal appearing cecum, ileocecal valve, and appendiceal orifice     were identified. The ascending, hepatic flexure, transverse,     splenic flexure, descending, sigmoid colon appeared unremarkable.     Retroflexed views in the rectum revealed no abnormalities.  The     time to cecum =  4  minutes. The scope was then withdrawn (time =     10.75  min) from the patient and the procedure completed.           COMPLICATIONS:  None           ENDOSCOPIC IMPRESSION:     1) 6 mm sessile polyp in the rectum   2) Normal colon           RECOMMENDATIONS:     1) Await pathology results     2) If the polyp removed is adenomatous (pre-cancerous),     colonoscopy in 5 years. Otherwise you should continue to follow     colorectal cancer screening for "routine risk" patients with     colonoscopy in 10 years.           Venita Lick. Russella Dar, MD, [facility]           n.     eSIGNED:   Venita Lick. Stark at 11/27/2009 08:57 AM           [place], [place], [id number]  Note: An exclamation mark (!) indicates a result that was not dispersed into the flowsheet. Document Creation Date: 11/27/2009 8:57 AM _______________________________________________________________________  (1) Order result status: Final Collection or observation  date-time: 11/27/2009 08:52 Requested date-time:  Receipt date-time:  Reported date-time:  Referring Physician:   Ordering Physician: Claudette Head [id number]) Specimen Source:  Source: Launa Grill Order Number: [id number] Lab site:   Appended Document: Colonoscopy     Procedures Next Due Date:    Colonoscopy: 11/2019

## 2010-03-06 NOTE — Assessment & Plan Note (Signed)
Summary: loose stools/dm   Vital Signs:  Patient profile:   50 year old female Menstrual status:  regular Weight:      216 pounds Temp:     97.8 degrees F oral BP sitting:   120 / 80  (left arm) Cuff size:   regular  Vitals Entered By: Kern Reap CMA Duncan Dull) (May 16, 2009 11:16 AM) CC: loose stools Is Patient Diabetic? No Pain Assessment Patient in pain? no        CC:  loose stools.  History of Present Illness: Karen [name] is a 50 year old, married female, nonsmoker, who comes in today for evaluation of loose bowel movements.  She states last Monday she began having 3 or 4 bowel movements a day.  She had no fever, chills, nausea, vomiting, or abdominal cramping.  This lasted 3 days went away and then recurred.  Review of systems negative.  She recently had a shot by her hematologist for anemia.  Her hemoglobin is now 11.  Allergies: No Known Drug Allergies  Past History:  Past medical, surgical, family and social histories (including risk factors) reviewed for relevance to current acute and chronic problems.  Past Medical History: Reviewed history from 05/22/2007 and no changes required. Anemia-unknown etiology Depression bipolar Hypothyroidism DUB  Family History: Reviewed history from 05/22/2007 and no changes required. father died in his 38s of unknown cause.  Mother in her late 50s, hypertension, and asthma.  No brothers.  Two sisters in good health  Social History: Reviewed history from 05/22/2007 and no changes required. Occupation:the Estate agent Married Never Smoked Alcohol use-no Drug use-no Regular exercise-yes  Review of Systems      See HPI  Physical Exam  General:  Well-developed,well-nourished,in no acute distress; alert,appropriate and cooperative throughout examination Abdomen:  Bowel sounds positive,abdomen soft and non-tender without masses, organomegaly or hernias noted. Rectal:  No external abnormalities noted. Normal  sphincter tone. No rectal masses or tenderness.   Problems:  Medical Problems Added: 1)  Dx of Diarrhea  (ICD-787.91)  Impression & Recommendations:  Problem # 1:  DIARRHEA (ICD-787.91) Assessment New  Complete Medication List: 1)  Synthroid 150 Mcg Tabs (Levothyroxine sodium) .... Take 1 tablet by mouth every morning 2)  Haloperidol 1 Mg Tabs (Haloperidol) .... Once daily 3)  Nortriptyline Hcl 25 Mg Caps (Nortriptyline hcl) .... Three qam 4)  Lexapro 10 Mg Tabs (Escitalopram oxalate) .... Once every other day 5)  Levora 0.15/30 (28) 0.15-30 Mg-mcg Tabs (Levonorgestrel-ethinyl estrad) .... Once daily 6)  Wellbutrin Sr 150 Mg Xr12h-tab (Bupropion hcl) .... Take one tab once daily  Patient Instructions: 1)  stay on a lactose free diet.  If your symptoms persist, then we will need to get you set up in GI for consultation call and we will help facilitate that

## 2010-03-06 NOTE — Letter (Signed)
Summary: Patient Notice- Colon Biospy Results  [facility]  [address] [place], [place] [address]   Phone: [phone]  Fax: [phone]        November 28, 2009 MRN: [id number]    [facility] [address] [place], [place]  [address]    Dear Ms. Tolson,  I am pleased to inform you that the biopsies taken during your recent colonoscopy did not show any evidence of cancer upon pathologic examination. The biopsies showed polypoid mucosa but not polyp tissue.  Continue with the treatment plan as outlined on the day of your      exam.  You should have a repeat colonoscopy examination for routine colorectal cancer screening in 10 years.  Please call us if you are having persistent problems or have questions about your condition that have not been fully answered at this time.  Sincerely,  Meryl Dare MD [facility]  This letter has been electronically signed by your physician.  Appended Document: Patient Notice- Colon Biospy Results letter mailed

## 2010-03-06 NOTE — Assessment & Plan Note (Signed)
Summary: CPX // RS   Vital Signs:  Patient profile:   50 year old female Menstrual status:  perimenopausal Height:      71 inches Weight:      227 pounds BMI:     31.77 Temp:     98.0 degrees F oral BP sitting:   120 / 90  (left arm) Cuff size:   regular  Vitals Entered By: Kern Reap CMA Duncan Dull) (November 02, 2009 10:39 AM) CC: cpx Is Patient Diabetic? No Pain Assessment Patient in pain? no          Menstrual Status perimenopausal Last PAP Result NEGATIVE FOR INTRAEPITHELIAL LESIONS OR MALIGNANCY.   CC:  cpx.  History of Present Illness: Karen [name] is a 50 year old, married female, nonsmoker, who comes in today for general physical examination and evaluation of hypothyroidism.  She takes Synthroid 150 micrograms dailyTSH level within normal limits.  She also takes Haldol 1 mg daily 75 mg of nortriptyline to be Wellbutrin 150 mg long-acting daily and is supposed to be taking Lexapro 10 mg daily however, she can afford it.  I advised her to call her psychiatrist and asked them to substitute Celexa.  LMP two to 3 years ago.  Minimal postmenopausal symptoms.  She gets routine eye care, dental care, BSE monthly, annual mammography, tetanus, 2010, seasonal flu today, colonoscopy since she is over 50, negative family history  Her hemoglobin is 10.7.  Etiology of anemia, unknown workup by hematology negative  Allergies (verified): No Known Drug Allergies  Past History:  Past medical, surgical, family and social histories (including risk factors) reviewed, and no changes noted (except as noted below).  Past Medical History: Reviewed history from 05/22/2007 and no changes required. Anemia-unknown etiology Depression bipolar Hypothyroidism DUB  Family History: Reviewed history from 05/22/2007 and no changes required. father died in his 58s of unknown cause.  Mother in her late 58s, hypertension, and asthma.  No brothers.  Two sisters in good health  Social  History: Reviewed history from 05/22/2007 and no changes required. 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 No apparent alopecia or balding. Eyes:  No corneal or conjunctival inflammation noted. EOMI. Perrla. Funduscopic exam benign, without hemorrhages, exudates or papilledema. Vision grossly normal. Ears:  External ear exam shows no significant lesions or deformities.  Otoscopic examination reveals clear canals, tympanic membranes are intact bilaterally without bulging, retraction, inflammation or discharge. Hearing is grossly normal bilaterally. Nose:  External nasal examination shows no deformity or inflammation. Nasal mucosa are pink and moist without lesions or exudates. Mouth:  Oral mucosa and oropharynx without lesions or exudates.  Teeth in good repair. Neck:  No deformities, masses, or tenderness noted. Chest Wall:  No deformities, masses, or tenderness noted. Breasts:  No mass, nodules, thickening, tenderness, bulging, retraction, inflamation, nipple discharge or skin changes noted.   Lungs:  Normal respiratory effort, chest expands symmetrically. Lungs are clear to auscultation, no crackles or wheezes. Heart:  Normal rate and regular rhythm. S1 and S2 normal without gallop, murmur, click, rub or other extra sounds. Abdomen:  Bowel sounds positive,abdomen soft and non-tender without masses, organomegaly or hernias noted. Rectal:  No external abnormalities noted. Normal sphincter tone. No rectal masses or tenderness. Genitalia:  Pelvic Exam:  External: normal female genitalia without lesions or masses        Vagina: normal without lesions or masses        Cervix: normal without  lesions or masses        Adnexa: normal bimanual exam without masses or fullness        Uterus: normal by palpation        Pap smear: performed Msk:  No deformity or scoliosis noted of thoracic or lumbar spine.   Pulses:  R and L carotid,radial,femoral,dorsalis pedis and posterior tibial pulses are full and equal bilaterally Extremities:  No clubbing, cyanosis, edema, or deformity noted with normal full range of motion of all joints.   Neurologic:  No cranial nerve deficits noted. Station and gait are normal. Plantar reflexes are down-going bilaterally. DTRs are symmetrical throughout. Sensory, motor and coordinative functions appear intact. Skin:  Intact without suspicious lesions or rashes Cervical Nodes:  No lymphadenopathy noted Axillary Nodes:  No palpable lymphadenopathy Inguinal Nodes:  No significant adenopathy Psych:  Oriented X3, depressed affect, flat affect, subdued, withdrawn, and poor eye contact.     Impression & Recommendations:  Problem # 1:  LEUKOPENIA, CHRONIC (ICD-288.50) Assessment Unchanged  Orders: Prescription Created Electronically [phone])  Problem # 2:  UNSPECIFIED ANEMIA (ICD-285.9) Assessment: Unchanged  Orders: Prescription Created Electronically [phone])  Problem # 3:  HYPOTHYROIDISM (ICD-244.9) Assessment: Improved  The following medications were removed from the medication list:    Synthroid 25 Mcg Tabs (Levothyroxine sodium) .Marland Kitchen... Take one tab by mouth once daily Her updated medication list for this problem includes:    Synthroid 150 Mcg Tabs (Levothyroxine sodium) .Marland Kitchen... Take 1 tablet by mouth every morning  Orders: Prescription Created Electronically [phone]) EKG w/ Interpretation (93000)  Problem # 4:  ROUTINE GENERAL MEDICAL EXAM@HEALTH  CARE FACL (ICD-V70.0) Assessment: Unchanged  Orders: Prescription Created Electronically [phone]) EKG w/ Interpretation ([address])  Complete Medication List: 1)  Synthroid 150 Mcg Tabs (Levothyroxine  sodium) .... Take 1 tablet by mouth every morning 2)  Haloperidol 1 Mg Tabs (Haloperidol) .... Once daily 3)  Nortriptyline Hcl 25 Mg Caps (Nortriptyline hcl) .... Three qam 4)  Wellbutrin Sr 150 Mg Xr12h-tab (Bupropion hcl) .... Take one tab once daily 5)  Ibuprofen 800 Mg Tabs (Ibuprofen) .... Take one tab by mouth once daily  Other Orders: Flu Vaccine 17yrs + (57846) Admin 1st Vaccine (96295) Gastroenterology Referral (GI)  Patient Instructions: 1)  Please schedule a follow-up appointment in 1 year. 2)  It is important that you exercise regularly at least 20 minutes 5 times a week. If you develop chest pain, have severe difficulty breathing, or feel very tired , stop exercising immediately and seek medical attention. 3)  Schedule your mammogram. 4)  Schedule a colonoscopy/sigmoidoscopy to help detect colon cancer. 5)  Take calcium +Vitamin D daily. 6)  Take an Aspirin every day. Prescriptions: SYNTHROID 150 MCG TABS (LEVOTHYROXINE SODIUM) Take 1 tablet by mouth every morning  #100 x 4   Entered and Authorized by:   Roderick Pee MD   Signed by:   Roderick Pee MD on 11/02/2009   Method used:   [facility]  [address] [id number]       [address]       [place], [place]  [address]       Ph: [phone] or [phone]       Fax: [phone]  RxID:   [id number]    Immunizations Administered:  Influenza Vaccine # 1:    Vaccine Type: Fluvax 3+    Site: right deltoid    Mfr: [employer]oSmith[employer]    Dose: 0.5 ml    Route: IM    Given by: Kern Reap CMA (AAMA)    Exp. Date: 08/04/2010    Lot #: [id number]    VIS given: 08/29/09 version given November 02, 2009.    Physician counseled: yes

## 2010-03-07 ENCOUNTER — Encounter (HOSPITAL_BASED_OUTPATIENT_CLINIC_OR_DEPARTMENT_OTHER): Payer: Self-pay | Admitting: Hematology & Oncology

## 2010-03-07 DIAGNOSIS — D72819 Decreased white blood cell count, unspecified: Secondary | ICD-10-CM

## 2010-03-07 DIAGNOSIS — D649 Anemia, unspecified: Secondary | ICD-10-CM

## 2010-03-07 DIAGNOSIS — N289 Disorder of kidney and ureter, unspecified: Secondary | ICD-10-CM

## 2010-03-07 DIAGNOSIS — D509 Iron deficiency anemia, unspecified: Secondary | ICD-10-CM

## 2010-03-07 LAB — CBC WITH DIFFERENTIAL (CANCER CENTER ONLY)
BASO#: 0 10*3/uL (ref 0.0–0.2)
Eosinophils Absolute: 0.1 10*3/uL (ref 0.0–0.5)
HGB: 9.9 g/dL — ABNORMAL LOW (ref 11.6–15.9)
LYMPH%: 50.2 % — ABNORMAL HIGH (ref 14.0–48.0)
MCH: 27.4 pg (ref 26.0–34.0)
MCV: 85 fL (ref 81–101)
MONO%: 6.8 % (ref 0.0–13.0)
NEUT#: 1.9 10*3/uL (ref 1.5–6.5)
RBC: 3.61 10*6/uL — ABNORMAL LOW (ref 3.70–5.32)

## 2010-03-07 LAB — RETICULOCYTES (CHCC): Retic Ct Pct: 1.4 % (ref 0.4–3.1)

## 2010-03-07 LAB — FERRITIN: Ferritin: 472 ng/mL — ABNORMAL HIGH (ref 10–291)

## 2010-03-07 LAB — IRON AND TIBC: UIBC: 191 ug/dL

## 2010-03-08 NOTE — Letter (Signed)
Summary: [facility]  [facility]   Imported By: Maryln Gottron 02/01/2010 13:32:53  _____________________________________________________________________  External Attachment:    Type:   Image     Comment:   External Document

## 2010-04-04 ENCOUNTER — Other Ambulatory Visit: Payer: Self-pay | Admitting: Hematology & Oncology

## 2010-04-04 ENCOUNTER — Encounter (HOSPITAL_BASED_OUTPATIENT_CLINIC_OR_DEPARTMENT_OTHER): Payer: BC Managed Care – PPO | Admitting: Hematology & Oncology

## 2010-04-04 DIAGNOSIS — D509 Iron deficiency anemia, unspecified: Secondary | ICD-10-CM

## 2010-04-04 DIAGNOSIS — D649 Anemia, unspecified: Secondary | ICD-10-CM

## 2010-04-04 DIAGNOSIS — D72819 Decreased white blood cell count, unspecified: Secondary | ICD-10-CM

## 2010-04-04 DIAGNOSIS — N289 Disorder of kidney and ureter, unspecified: Secondary | ICD-10-CM

## 2010-04-04 LAB — CBC WITH DIFFERENTIAL (CANCER CENTER ONLY)
BASO#: 0 10*3/uL (ref 0.0–0.2)
Eosinophils Absolute: 0.1 10*3/uL (ref 0.0–0.5)
HCT: 36.5 % (ref 34.8–46.6)
LYMPH#: 2.1 10*3/uL (ref 0.9–3.3)
LYMPH%: 42.2 % (ref 14.0–48.0)
MCHC: 32.2 g/dL (ref 32.0–36.0)
MONO#: 0.4 10*3/uL (ref 0.1–0.9)
MONO%: 7.6 % (ref 0.0–13.0)
Platelets: 316 10*3/uL (ref 145–400)
RDW: 12.3 % (ref 10.5–14.6)
WBC: 4.9 10*3/uL (ref 3.9–10.0)

## 2010-04-04 LAB — RETICULOCYTES (CHCC)
ABS Retic: 25.1 10*3/uL (ref 19.0–186.0)
Retic Ct Pct: 0.6 % (ref 0.4–3.1)

## 2010-04-04 LAB — IRON AND TIBC
%SAT: 28 % (ref 20–55)
TIBC: 293 ug/dL (ref 250–470)
UIBC: 212 ug/dL

## 2010-04-04 LAB — COMPREHENSIVE METABOLIC PANEL
ALT: 24 U/L (ref 0–35)
Albumin: 4.3 g/dL (ref 3.5–5.2)
Alkaline Phosphatase: 115 U/L (ref 39–117)
Glucose, Bld: 93 mg/dL (ref 70–99)
Potassium: 4 mEq/L (ref 3.5–5.3)
Sodium: 140 mEq/L (ref 135–145)
Total Protein: 7.9 g/dL (ref 6.0–8.3)

## 2010-05-24 ENCOUNTER — Encounter (HOSPITAL_BASED_OUTPATIENT_CLINIC_OR_DEPARTMENT_OTHER): Payer: BC Managed Care – PPO | Admitting: Hematology & Oncology

## 2010-05-24 ENCOUNTER — Other Ambulatory Visit: Payer: Self-pay | Admitting: Hematology & Oncology

## 2010-05-24 DIAGNOSIS — N289 Disorder of kidney and ureter, unspecified: Secondary | ICD-10-CM

## 2010-05-24 DIAGNOSIS — D509 Iron deficiency anemia, unspecified: Secondary | ICD-10-CM

## 2010-05-24 DIAGNOSIS — D649 Anemia, unspecified: Secondary | ICD-10-CM

## 2010-05-24 LAB — CBC WITH DIFFERENTIAL (CANCER CENTER ONLY)
BASO%: 0.2 % (ref 0.0–2.0)
Eosinophils Absolute: 0.1 10*3/uL (ref 0.0–0.5)
LYMPH#: 1.8 10*3/uL (ref 0.9–3.3)
MONO#: 0.4 10*3/uL (ref 0.1–0.9)
NEUT#: 2.5 10*3/uL (ref 1.5–6.5)
Platelets: 273 10*3/uL (ref 145–400)
RBC: 3.78 10*6/uL (ref 3.70–5.32)
RDW: 13.6 % (ref 11.1–15.7)
WBC: 4.8 10*3/uL (ref 3.9–10.0)

## 2010-05-24 LAB — RETICULOCYTES (CHCC)
RBC.: 3.92 MIL/uL (ref 3.87–5.11)
Retic Ct Pct: 0.8 % (ref 0.4–3.1)

## 2010-05-24 LAB — FERRITIN: Ferritin: 468 ng/mL — ABNORMAL HIGH (ref 10–291)

## 2010-05-24 LAB — IRON AND TIBC
%SAT: 28 % (ref 20–55)
Iron: 80 ug/dL (ref 42–145)

## 2010-05-24 LAB — CHCC SATELLITE - SMEAR

## 2010-06-22 NOTE — H&P (Signed)
NAMEMarland Kitchen  Karen [name], Karen [name]                          ACCOUNT NO.:  [id number]   MEDICAL RECORD NO.:  [id number]                   PATIENT TYPE:  IPS   LOCATION:  0502                                 FACILITY:  [facility]   PHYSICIAN:  Jeanice Lim, M.D.              DATE OF BIRTH:  07-21-1960   DATE OF ADMISSION:  06/03/2002  DATE OF DISCHARGE:  06/06/2002                         PSYCHIATRIC ADMISSION ASSESSMENT   IDENTIFYING INFORMATION:  This is a 50 year old African-American female who  is married.  This is a voluntary admission.   HISTORY OF PRESENT ILLNESS:  This patient was referred for increased crying  spells and feeling unable to focus or organize her thoughts.  She described  herself as just not being able to cope and this feeling of not coping has  gotten worse since January of this year when the patient gradually switched  from Haldol to Risperdal.  She endorses increased crying, decreased  concentration, has been unable to organize herself or think clear.  Has  coped poorly with recent stressors with her physical health.  She denies any  overt auditory or visual hallucinations.  She has no history of prior  suicide attempts and she endorses some feelings of thought blocking, that  she cannot think clearly or complete her thoughts but denies any overt  suicidal or homicidal thoughts.   PAST PSYCHIATRIC HISTORY:  The patient is followed by Dr. Aleatha Borer as  an outpatient.  This is her first admission to [facility].  She was initially diagnosed with psychosis with symptoms of  paranoia in 1993 coinciding with problems with hyperthyroidism.   SOCIAL HISTORY:  This is a married female with the support of husband and  family.  She has no children.  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   FAMILY HISTORY:  Unremarkable.   ALCOHOL/DRUG HISTORY:  The patient denies any substance abuse.   PAST MEDICAL HISTORY:  The patient is  followed by Dr. Kelle Darting, who is  her primary care physician.  Medical problems include status post  hypothyroid.  She was treated with radioactive iodine in 1993.   MEDICATIONS:  Synthroid 100 mcg p.o. daily and this was just recently  increased to this dosage in January of 2004.  She does use oral  contraceptives.  She cannot remember the name of them.  She takes  nortriptyline 75 mg p.o. q.d. and Cogentin 0.5 mg p.o. q.h.s.  The patient  was previously on Haldol 2 mg q.h.s. and now she is on Risperdal 3 mg p.o.  q.h.s.   ALLERGIES:  None.   REVIEW OF SYSTEMS:  The patient reports that she has been amenorrheic also  since she started on the Risperdal and endorses decreased sleep, able to  sleep only approximately five hours per night at one time since she is  currently working two jobs.  POSITIVE PHYSICAL FINDINGS:  GENERAL:  This is a well-nourished, well-  developed female who is in no acute distress.  Her vital signs were within  normal limits.  She is muscular, healthy-appearing.  Her hygiene is  satisfactory.  She is relaxed and cooperative.  VITAL SIGNS:  Temperature 98.8, pulse 104, respirations 15, blood pressure  146/84.  She is 5 feet 11 inches tall and weighs 185 pounds.  SKIN:  Dark in tone, smooth, no remarkable features.  HEAD:  Normocephalic and atraumatic.  EENT:  PERRLA.  Sclerae nonicteric.  No rhinorrhea.  Oropharynx is good  condition, noninjected.  NECK:  Supple.  No thyromegaly or nodules palpated.  CARDIOVASCULAR:  S1 and S2 heard.  No clicks, murmurs or gallops.  ABDOMEN:  Flat, soft and nontender.  Normal bowel sounds.  GENITALIA:  Deferred.  MUSCULOSKELETAL:  Within normal limits.  NEURO:  Cranial nerves 2-12 intact.  EOMs intact.  No nystagmus.  Motor  movements smooth.  Cerebellar function intact.  Romberg without findings.  Deep tendon reflexes are 3+/5 and are brisk.  Sensory grossly intact.   LABORATORY DATA:  Essentially normal urinalysis.   Urine drug screen was  negative for all substances.  CBC is normal.  Metabolic panel is within  normal limits.  TSH is 0.519.   MENTAL STATUS EXAM:  This is a fully alert, attractive, well-groomed,  African-American female who appears to be her stated age of 50.  She is  cooperative, although seems to be somewhat slightly detached and blunted  affect.  Speech is within normal limits.  Mood is depressed.  Thought  process is positive for vague sense of paranoia.  Considerable lack of  volition.  Difficulty with thought organization and occasional thought  blocking.  No clear suicidal or homicidal ideation or overt hallucinations.  Cognitively, she is intact and oriented x 3.   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  2. Depressive disorder not otherwise specified.   AXIS II:  No diagnosis.   AXIS III:  1. Hypothyroidism.  2. Amenorrhea.   AXIS IV:  Moderate (stress secondary to her work schedule).   AXIS V:  Current 50; past year 10.   PLAN:  Voluntarily admit the patient to alleviate her psychotic symptoms and  her thought processing difficulties and improve her volition and we have a  urine drug screen currently pending on her and are going to check a  nortriptyline and prolactin levels.  We are going to discontinue her  Risperdal and return her to the Haldol, which she functioned much better on  with her previous dose of 2 mg p.o. q.h.s.  Also continue her Cogentin 1 mg  q.h.s. and nortriptyline 75 mg p.o. q.h.s.  We will add Lexapro 5 mg.  Meanwhile, we are going to continue her Synthroid and we have reviewed the  plan with her and she has asked some pertinent questions and is in  agreement.   ESTIMATED LENGTH OF STAY:  Six days.     Margaret A. Stephannie Peters                   Jeanice Lim, M.D.   MAS/MEDQ  D:  07/14/2002  T:  07/14/2002  Job:  [id number]

## 2010-06-22 NOTE — Discharge Summary (Signed)
NAME:  Karen [name], Karen [name] NO.:  [id number]   MEDICAL RECORD NO.:  [id number]                   PATIENT TYPE:  IPS   LOCATION:  0502                                 [facility]ILITY[facility]  [facility]   PHYSICIAN:  Jeanice Lim, M.D.              DATE OF BIRTH:  [birth year]   DATE OF ADMISSION:  06/03/2002  DATE OF DISCHARGE:  06/06/2002                                 DISCHARGE SUMMARY   IDENTIFYING DATA:  This is a 50 year old African-American female, married,  voluntarily admitted for increased crying spells, feeling unable to focus,  endorsing inability to function safely.  The patient had been followed up by  Dr. Kathrynn Running in the past and Kelle Darting and had been changed gradually over  extended time period from Haldol to Risperdal and recently decompensated,  with an increase in psychotic symptoms, not able to differentiate the  difference between reality and what was in her head and feeling paranoid, as  well as severely depressed, not eating.   ADMISSION MEDICATIONS:  Synthroid 100 mcg q.a.m., oral contraceptive pills,  nortriptyline and Cogentin.   ALLERGIES:  No known drug allergies.   PHYSICAL EXAMINATION:  Essentially within normal limits, neurologically  nonfocal.   ROUTINE ADMISSION LABS:  Within normal limits.   MENTAL STATUS EXAM:  Fully alert, cooperative.  Mood very depressed, affect  restricted, somewhat guarded.  Speech within normal limits.  Vague paranoia.  No acute suicidal or homicidal ideation, reporting neurovegetative symptoms  of possible major depression  with psychosis.  Cognitively intact.  Judgment  and insight limited at this time.   ADMISSION DIAGNOSES:   AXIS I:  Major depressive disorder, recurrent, severe, with psychotic  features.   AXIS II:  None.   AXIS III:  Hypothyroidism.   AXIS IV:  Moderate problems with primary support group and excessive work  hours.   AXIS V:  36/70.   HOSPITAL COURSE:  The patient was  admitted and ordered routine p.r.n.  medications, underwent further monitoring, and was encouraged to participate  in individual, group and milieu therapy.  Due to patient's past response to  previous medication Risperdal, after discussing with the patient was  discontinued and the patient was switched back to Haldol and Lexapro was  decreased to 5 mg to minimize any worsening of psychotic symptoms.  The  patient reported positive response to medication changes and her reality  testing improved and she no longer felt paranoid or unable to function or  think clearly.  She was able to take care of ADLs and thought that she could  safely cope with being out of the hospital.   CONDITION ON DISCHARGE:  Improved.  She was less depressed.  Affect  brighter, thought process goal directed.  Thought content negative for  dangerous ideation or psychotic symptoms.  The patient was still mildly  depressed and she reported motivation to be compliant with the  aftercare  plan as well as take better care of herself and get adequate sleep.   DISCHARGE MEDICATIONS:  1. Haldol 10 mg q.h.s.  2. Cogentin 1 mg q.h.s.  3. Lexapro 10 mg q.a.m.  4. Nortriptyline 25 mg 3 q.h.s.   DISPOSITION:  The patient was to follow up with Dr. Kathrynn Running within 1 week  and released to return to work on May 5.   DISCHARGE DIAGNOSES:   AXIS I:  Major depressive disorder, recurrent, severe, with psychotic  features.   AXIS II:  None.   AXIS III:  Hypothyroidism.   AXIS IV:  Moderate problems with primary support group and excessive work  hours.   AXIS V:  Global assessment of function on discharge was 50.                                                 Jeanice Lim, M.D.    JEM/MEDQ  D:  06/24/2002  T:  06/25/2002  Job:  [id number]

## 2010-06-28 ENCOUNTER — Other Ambulatory Visit: Payer: Self-pay | Admitting: Hematology & Oncology

## 2010-06-28 ENCOUNTER — Encounter (HOSPITAL_BASED_OUTPATIENT_CLINIC_OR_DEPARTMENT_OTHER): Payer: BC Managed Care – PPO | Admitting: Hematology & Oncology

## 2010-06-28 DIAGNOSIS — D649 Anemia, unspecified: Secondary | ICD-10-CM

## 2010-06-28 DIAGNOSIS — D509 Iron deficiency anemia, unspecified: Secondary | ICD-10-CM

## 2010-06-28 DIAGNOSIS — N289 Disorder of kidney and ureter, unspecified: Secondary | ICD-10-CM

## 2010-06-28 LAB — CBC WITH DIFFERENTIAL (CANCER CENTER ONLY)
BASO%: 0.2 % (ref 0.0–2.0)
EOS%: 2.5 % (ref 0.0–7.0)
Eosinophils Absolute: 0.1 10*3/uL (ref 0.0–0.5)
MCH: 28.9 pg (ref 26.0–34.0)
MCHC: 31.8 g/dL — ABNORMAL LOW (ref 32.0–36.0)
MONO%: 9 % (ref 0.0–13.0)
NEUT#: 1.8 10*3/uL (ref 1.5–6.5)
Platelets: 259 10*3/uL (ref 145–400)
RBC: 3.43 10*6/uL — ABNORMAL LOW (ref 3.70–5.32)
RDW: 13.5 % (ref 11.1–15.7)

## 2010-06-28 LAB — FERRITIN: Ferritin: 376 ng/mL — ABNORMAL HIGH (ref 10–291)

## 2010-06-28 LAB — IRON AND TIBC
Iron: 77 ug/dL (ref 42–145)
TIBC: 265 ug/dL (ref 250–470)
UIBC: 188 ug/dL

## 2010-10-17 ENCOUNTER — Other Ambulatory Visit: Payer: Self-pay | Admitting: Family Medicine

## 2010-10-17 DIAGNOSIS — Z1231 Encounter for screening mammogram for malignant neoplasm of breast: Secondary | ICD-10-CM

## 2010-10-19 ENCOUNTER — Other Ambulatory Visit: Payer: Self-pay | Admitting: Hematology & Oncology

## 2010-10-19 ENCOUNTER — Encounter (HOSPITAL_BASED_OUTPATIENT_CLINIC_OR_DEPARTMENT_OTHER): Payer: BC Managed Care – PPO | Admitting: Hematology & Oncology

## 2010-10-19 DIAGNOSIS — D649 Anemia, unspecified: Secondary | ICD-10-CM

## 2010-10-19 DIAGNOSIS — D509 Iron deficiency anemia, unspecified: Secondary | ICD-10-CM

## 2010-10-19 DIAGNOSIS — N289 Disorder of kidney and ureter, unspecified: Secondary | ICD-10-CM

## 2010-10-19 LAB — CBC WITH DIFFERENTIAL (CANCER CENTER ONLY)
BASO#: 0 10*3/uL (ref 0.0–0.2)
EOS%: 1.4 % (ref 0.0–7.0)
MCH: 29.9 pg (ref 26.0–34.0)
MCHC: 32.8 g/dL (ref 32.0–36.0)
MONO%: 8.6 % (ref 0.0–13.0)
NEUT#: 2.3 10*3/uL (ref 1.5–6.5)
Platelets: 301 10*3/uL (ref 145–400)
RDW: 13.4 % (ref 11.1–15.7)

## 2010-10-19 LAB — CHCC SATELLITE - SMEAR

## 2010-10-23 LAB — IRON AND TIBC
%SAT: 33 % (ref 20–55)
TIBC: 268 ug/dL (ref 250–470)
UIBC: 180 ug/dL (ref 125–400)

## 2010-10-23 LAB — FERRITIN: Ferritin: 413 ng/mL — ABNORMAL HIGH (ref 10–291)

## 2010-10-23 LAB — RETICULOCYTES (CHCC): ABS Retic: 59.5 10*3/uL (ref 19.0–186.0)

## 2010-10-25 ENCOUNTER — Other Ambulatory Visit (INDEPENDENT_AMBULATORY_CARE_PROVIDER_SITE_OTHER): Payer: BC Managed Care – PPO

## 2010-10-25 DIAGNOSIS — Z0000 Encounter for general adult medical examination without abnormal findings: Secondary | ICD-10-CM

## 2010-10-25 LAB — CBC WITH DIFFERENTIAL/PLATELET
Basophils Absolute: 0 10*3/uL (ref 0.0–0.1)
Eosinophils Relative: 0.9 % (ref 0.0–5.0)
MCV: 94.3 fl (ref 78.0–100.0)
Monocytes Absolute: 0.4 10*3/uL (ref 0.1–1.0)
Neutrophils Relative %: 55.9 % (ref 43.0–77.0)
Platelets: 317 10*3/uL (ref 150.0–400.0)
RDW: 14.4 % (ref 11.5–14.6)
WBC: 4.4 10*3/uL — ABNORMAL LOW (ref 4.5–10.5)

## 2010-10-25 LAB — BASIC METABOLIC PANEL
BUN: 13 mg/dL (ref 6–23)
Calcium: 9.2 mg/dL (ref 8.4–10.5)
GFR: 85.85 mL/min (ref >60.00)
Glucose, Bld: 87 mg/dL (ref 70–99)
Sodium: 142 mEq/L (ref 135–145)

## 2010-10-25 LAB — POCT URINALYSIS DIPSTICK
Bilirubin, UA: NEGATIVE
Blood, UA: NEGATIVE
Nitrite, UA: POSITIVE
Protein, UA: NEGATIVE
Urobilinogen, UA: 0.2
pH, UA: 6

## 2010-10-25 LAB — LIPID PANEL
Cholesterol: 210 mg/dL — ABNORMAL HIGH (ref 0–200)
HDL: 46.9 mg/dL (ref >39.00)
Total CHOL/HDL Ratio: 4
Triglycerides: 85 mg/dL (ref 0.0–149.0)
VLDL: 17 mg/dL (ref 0.0–40.0)

## 2010-10-25 LAB — HEPATIC FUNCTION PANEL
ALT: 15 U/L (ref 0–35)
Bilirubin, Direct: 0 mg/dL (ref 0.0–0.3)
Total Bilirubin: 0.6 mg/dL (ref 0.3–1.2)

## 2010-11-05 ENCOUNTER — Encounter: Payer: Self-pay | Admitting: Family Medicine

## 2010-11-06 ENCOUNTER — Encounter: Payer: Self-pay | Admitting: Family Medicine

## 2010-11-06 ENCOUNTER — Ambulatory Visit (INDEPENDENT_AMBULATORY_CARE_PROVIDER_SITE_OTHER): Payer: BC Managed Care – PPO | Admitting: Family Medicine

## 2010-11-06 DIAGNOSIS — D72819 Decreased white blood cell count, unspecified: Secondary | ICD-10-CM

## 2010-11-06 DIAGNOSIS — Z23 Encounter for immunization: Secondary | ICD-10-CM

## 2010-11-06 DIAGNOSIS — Z0000 Encounter for general adult medical examination without abnormal findings: Secondary | ICD-10-CM

## 2010-11-06 DIAGNOSIS — E039 Hypothyroidism, unspecified: Secondary | ICD-10-CM

## 2010-11-06 DIAGNOSIS — F329 Major depressive disorder, single episode, unspecified: Secondary | ICD-10-CM

## 2010-11-06 DIAGNOSIS — D649 Anemia, unspecified: Secondary | ICD-10-CM

## 2010-11-06 DIAGNOSIS — F3289 Other specified depressive episodes: Secondary | ICD-10-CM

## 2010-11-06 MED ORDER — LEVOTHYROXINE SODIUM 150 MCG PO TABS: 150.0000 ug | ORAL_TABLET | Freq: Every day | ORAL | Status: DC

## 2010-11-06 NOTE — Patient Instructions (Signed)
Continue your current medications.  I would recommend an eye consult with Dr. Gweneth Dimitri for evaluation of the pain.  Her right eye.  I also put in a consult request from GI for evaluation of the reflux................... somebody will call you from GI in the next week.  Return in one year, sooner for any problems

## 2010-11-06 NOTE — Progress Notes (Signed)
  Subjective:    Patient ID: Karen [name], female    DOB: 05/18/1960, 50 y.o.   MRN: [id number]  HPIkaron is a 50 year old single female nonsmoker who comes in today for evaluation of hypothyroidism, anemia, and depression, and reflux esophagitis.  Her depression is treated by her psychiatrist.  She is currently on Wellbutrin 150 mg daily, Haldol 1 mg b.i.d., Pamelor, 25 mg nightly, and Lexapro 10 nightly, and Ambien 10 mg p.r.n.  She takes Synthroid 150 mcg daily TSH level normal .81  Colonoscopy last year was normal except for one polyp.Advised her to go back to GI for evaluation of the reflux since the Prilosec is not controlling her symptoms.  She gets routine eye care, but is having pain in her right eye.  Advised to see an ophthalmologist, regular dental care, BSE monthly, and you mammography, tetanus, 2010, seasonal flu shot today.  LMP two years ago, not on HRT, relatively asymptomatic  She also has a history of anemia, etiology unknown.  She is followed by Dr. Theron Arista I.  In hematology.  Review of Systems  Constitutional: Negative.   HENT: Negative.   Eyes: Negative.   Respiratory: Negative.   Cardiovascular: Negative.   Gastrointestinal: Negative.   Genitourinary: Negative.   Musculoskeletal: Negative.   Neurological: Negative.   Hematological: Negative.   Psychiatric/Behavioral: Negative.        Objective:   Physical Exam  Constitutional: She appears well-developed and well-nourished.  HENT:  Head: Normocephalic and atraumatic.  Right Ear: External ear normal.  Left Ear: External ear normal.  Nose: Nose normal.  Mouth/Throat: Oropharynx is clear and moist.  Eyes: EOM are normal. Pupils are equal, round, and reactive to light.  Neck: Normal range of motion. Neck supple. No thyromegaly present.  Cardiovascular: Normal rate, regular rhythm, normal heart sounds and intact distal pulses.  Exam reveals no gallop and no friction rub.   No murmur heard. Pulmonary/Chest:  Effort normal and breath sounds normal.  Abdominal: Soft. Bowel sounds are normal. She exhibits no distension and no mass. There is no tenderness. There is no rebound.  Genitourinary: Vagina normal and uterus normal. Guaiac negative stool. No vaginal discharge found.       Bilateral breast exam normal  Musculoskeletal: Normal range of motion.  Lymphadenopathy:    She has no cervical adenopathy.  Neurological: She is alert. She has normal reflexes. No cranial nerve deficit. She exhibits normal muscle tone. Coordination normal.  Skin: Skin is warm and dry.  Psychiatric: She has a normal mood and affect. Her behavior is normal. Judgment and thought content normal.          Assessment & Plan:  Healthy female.  Chronic depression.  Continue medications and follow-up by psychiatrist.  History of hypothyroidism, on Synthroid 150 mcg daily continue daily medication.  History of reflux esophagitis and controlled Prilosec 20 daily advise GI consult.  Postmenopausal asymptomatic.  History of colon polyp on screening colonoscopy.  Last year.  Follow-up in GI.  Pain right eye, unknown etiology.  Recommend ophthalmologic exam Anemia unknown etiology.  Continue follow-up with hematology

## 2010-11-08 ENCOUNTER — Ambulatory Visit
Admission: RE | Admit: 2010-11-08 | Discharge: 2010-11-08 | Disposition: A | Discharge status: Home / Self Care | Payer: BC Managed Care – PPO | Source: Ambulatory Visit | Attending: Family Medicine | Admitting: Family Medicine

## 2010-11-08 DIAGNOSIS — Z1231 Encounter for screening mammogram for malignant neoplasm of breast: Secondary | ICD-10-CM

## 2010-11-29 ENCOUNTER — Ambulatory Visit (INDEPENDENT_AMBULATORY_CARE_PROVIDER_SITE_OTHER): Payer: BC Managed Care – PPO | Admitting: Gastroenterology

## 2010-11-29 ENCOUNTER — Encounter: Payer: Self-pay | Admitting: Gastroenterology

## 2010-11-29 VITALS — BP 128/72 | HR 74 | Ht 70.0 in | Wt 225.0 lb

## 2010-11-29 DIAGNOSIS — K219 Gastro-esophageal reflux disease without esophagitis: Secondary | ICD-10-CM

## 2010-11-29 DIAGNOSIS — D509 Iron deficiency anemia, unspecified: Secondary | ICD-10-CM

## 2010-11-29 MED ORDER — OMEPRAZOLE MAGNESIUM 20 MG PO TBEC: 20.0000 mg | DELAYED_RELEASE_TABLET | Freq: Every day | ORAL | Status: DC

## 2010-11-29 NOTE — Progress Notes (Signed)
History of Present Illness: This is a 50 year old female who relates the onset of reflux symptoms about 2-3 months ago. She noted episodes of reflux at night waking her from sleep. When she takes omeprazole 20 mg every morning on a regular basis she has very good control of her symptoms. When she misses 1 or 2 doses her symptoms return. In addition she has had a several year history of recurrent iron deficiency anemia without clear etiology. She is followed by Dr. Myna Hidalgo and has intermittent iron infusions. She underwent colonoscopy last year which was unremarkable. Denies weight loss, abdominal pain, constipation, diarrhea, change in stool caliber, melena, hematochezia, nausea, vomiting, dysphagia, chest pain.  Current Medications, Allergies, Past Medical History, Past Surgical History, Family History and Social History were reviewed in [employer] record.  Physical Exam: General: Well developed , well nourished, no acute distress Head: Normocephalic and atraumatic Eyes:  sclerae anicteric, EOMI Ears: Normal auditory acuity Mouth: No deformity or lesions Lungs: Clear throughout to auscultation Heart: Regular rate and rhythm; no murmurs, rubs or bruits Abdomen: Soft, non tender and non distended. No masses, hepatosplenomegaly or hernias noted. Normal Bowel sounds Musculoskeletal: Symmetrical with no gross deformities  Pulses:  Normal pulses noted Extremities: No clubbing, cyanosis, edema or deformities noted Neurological: Alert oriented x 4, grossly nonfocal Psychological:  Alert and cooperative. Depressed affect.   Assessment and Recommendations:  1. New onset reflux symptoms. Continue omeprazole 20 mg daily. Begin all standard antireflux measures including 4 " bed blocks. Schedule endoscopy to evaluate for erosive esophagitis, Barrett's esophagus and other disorders.The risks, benefits, and alternatives to endoscopy with possible biopsy and possible dilation were  discussed with the patient and they consent to proceed.   2. Recurrent iron deficiency anemia. Etiology unclear. Request records from Dr. Myna Hidalgo. Further evaluation with upper endoscopy.

## 2010-11-29 NOTE — Patient Instructions (Addendum)
  You have been scheduled for an endoscopy. Please follow written instructions given to you at your visit today.  We have sent the following medications to your pharmacy for you to pick up at your convenience: Omeprazole   Patient advised to avoid spicy, acidic, citrus, chocolate, mints, fruit and fruit juices.  Limit the intake of caffeine, alcohol and Soda.  Don't exercise too soon after eating.  Don't lie down within 3-4 hours of eating.  Elevate the head of your bed.  cc: Burnice Logan.D.

## 2010-12-10 ENCOUNTER — Other Ambulatory Visit: Payer: Self-pay | Admitting: Family Medicine

## 2010-12-11 ENCOUNTER — Encounter: Payer: Self-pay | Admitting: Gastroenterology

## 2010-12-11 ENCOUNTER — Ambulatory Visit (AMBULATORY_SURGERY_CENTER): Payer: BC Managed Care – PPO | Admitting: Gastroenterology

## 2010-12-11 DIAGNOSIS — K2270 Barrett's esophagus without dysplasia: Secondary | ICD-10-CM

## 2010-12-11 DIAGNOSIS — K219 Gastro-esophageal reflux disease without esophagitis: Secondary | ICD-10-CM

## 2010-12-11 DIAGNOSIS — D509 Iron deficiency anemia, unspecified: Secondary | ICD-10-CM

## 2010-12-11 MED ORDER — SODIUM CHLORIDE 0.9 % IV SOLN: 500.0000 mL | INTRAVENOUS | Status: DC

## 2010-12-11 NOTE — Patient Instructions (Signed)
Please refer to the neon green sheet for instructions regarding activity for the rest of today.  Handout on  HIATAL HERNIA and a soft diet given. Resume previous medications.

## 2010-12-12 ENCOUNTER — Telehealth: Payer: Self-pay | Admitting: *Deleted

## 2010-12-12 NOTE — Telephone Encounter (Signed)
LMOM

## 2010-12-17 ENCOUNTER — Encounter: Payer: Self-pay | Admitting: Gastroenterology

## 2011-02-20 ENCOUNTER — Other Ambulatory Visit (HOSPITAL_BASED_OUTPATIENT_CLINIC_OR_DEPARTMENT_OTHER): Payer: BC Managed Care – PPO | Admitting: Lab

## 2011-02-20 ENCOUNTER — Ambulatory Visit (HOSPITAL_BASED_OUTPATIENT_CLINIC_OR_DEPARTMENT_OTHER): Payer: BC Managed Care – PPO | Admitting: Hematology & Oncology

## 2011-02-20 DIAGNOSIS — F3289 Other specified depressive episodes: Secondary | ICD-10-CM

## 2011-02-20 DIAGNOSIS — N289 Disorder of kidney and ureter, unspecified: Secondary | ICD-10-CM

## 2011-02-20 DIAGNOSIS — D509 Iron deficiency anemia, unspecified: Secondary | ICD-10-CM

## 2011-02-20 DIAGNOSIS — D631 Anemia in chronic kidney disease: Secondary | ICD-10-CM

## 2011-02-20 DIAGNOSIS — D649 Anemia, unspecified: Secondary | ICD-10-CM

## 2011-02-20 DIAGNOSIS — N189 Chronic kidney disease, unspecified: Secondary | ICD-10-CM

## 2011-02-20 DIAGNOSIS — F329 Major depressive disorder, single episode, unspecified: Secondary | ICD-10-CM

## 2011-02-20 LAB — CBC WITH DIFFERENTIAL (CANCER CENTER ONLY)
BASO#: 0 10*3/uL (ref 0.0–0.2)
Eosinophils Absolute: 0.1 10*3/uL (ref 0.0–0.5)
HGB: 10.6 g/dL — ABNORMAL LOW (ref 11.6–15.9)
LYMPH%: 48.7 % — ABNORMAL HIGH (ref 14.0–48.0)
MCH: 29.2 pg (ref 26.0–34.0)
MCHC: 31.4 g/dL — ABNORMAL LOW (ref 32.0–36.0)
MCV: 93 fL (ref 81–101)
MONO%: 9.5 % (ref 0.0–13.0)
Platelets: 258 10*3/uL (ref 145–400)
RBC: 3.63 10*6/uL — ABNORMAL LOW (ref 3.70–5.32)

## 2011-02-20 LAB — FERRITIN: Ferritin: 480 ng/mL — ABNORMAL HIGH (ref 10–291)

## 2011-02-20 LAB — IRON AND TIBC
TIBC: 286 ug/dL (ref 250–470)
UIBC: 183 ug/dL (ref 125–400)

## 2011-02-20 NOTE — Progress Notes (Signed)
CC:   Karen Gens A. [name], MD  DIAGNOSES: 1. Anemia secondary to renal insufficiency. 2. Intermittent iron-deficiency anemia.  CURRENT THERAPY: 1. Aranesp 300 mcg subcutaneously as needed for hemoglobin less than     11. 2. IV iron as indicated.  INTERIM HISTORY:  Karen [name] comes in for followup.  We last saw her in September.  She has been doing well.  She is working as a Surveyor, mining.  She enjoys this.  When we last saw her in September, we did give her a dose of Aranesp 300 mcg.  Her iron studies back in September showed a ferritin of 413 with an iron saturation of 33%.  She has had no bleeding.  There has been no change in bowel or bladder habits.  She has had no fevers, sweats, or chills.  She has not noted any bony pain.  She continues on her medications for depression.  She is also taking Synthroid.  PHYSICAL EXAMINATION:  General Appearance:  This is a well-developed, well-nourished, African female in no obvious distress.  Vital Signs: Temperature of 97.1.  Pulse 76.  Respiratory rate 18.  Blood pressure 110/78.  Weight is 228.  Head and Neck Exam:  A normocephalic and atraumatic skull.  There are no ocular or oral lesions.  There are no palpable cervical or supraclavicular lymph nodes.  Lungs:  Clear bilaterally.  Cardiac Exam:  Regular rate and rhythm with a normal S1 and S2.  There are no murmurs, rubs, or bruits.  Abdominal Exam:  Soft with good bowel sounds.  There is no palpable abdominal mass.  There is no fluid wave.  There is no palpable hepatosplenomegaly.  Back Exam:  No tenderness over the spine, ribs, or hips.  Extremities:  No clubbing, cyanosis, or edema.  Neurological Exam:  No focal neurological deficits.  LABORATORY DATA:  White cell count 4.1, hemoglobin 10.6, hematocrit 33.8, platelet count 258.  MCV is 93.  IMPRESSION:  Karen [name] is a 51 year old, [name] with multifactorial anemia.  She has some renal insufficiency.  I do  not think she needs any Aranesp today.  We will see what her iron levels are.  I think that they should be okay.  We will plan to get Karen [name] back to see [place] in another 3-4 months.  I think we can get her through the wintertime without having to get her back.    ______________________________ Josph Macho, M.D. PRE/MEDQ  D:  02/20/2011  T:  02/20/2011  Job:  [id number]

## 2011-02-20 NOTE — Progress Notes (Signed)
This office note has been dictated.

## 2011-06-17 ENCOUNTER — Telehealth: Payer: Self-pay | Admitting: Hematology & Oncology

## 2011-06-17 ENCOUNTER — Other Ambulatory Visit (HOSPITAL_BASED_OUTPATIENT_CLINIC_OR_DEPARTMENT_OTHER): Payer: BC Managed Care – PPO | Admitting: Lab

## 2011-06-17 ENCOUNTER — Ambulatory Visit (HOSPITAL_BASED_OUTPATIENT_CLINIC_OR_DEPARTMENT_OTHER): Payer: BC Managed Care – PPO | Admitting: Hematology & Oncology

## 2011-06-17 VITALS — BP 102/69 | HR 79 | Temp 97.0°F | Ht 69.0 in | Wt 237.0 lb

## 2011-06-17 DIAGNOSIS — N189 Chronic kidney disease, unspecified: Secondary | ICD-10-CM

## 2011-06-17 DIAGNOSIS — D631 Anemia in chronic kidney disease: Secondary | ICD-10-CM

## 2011-06-17 DIAGNOSIS — N289 Disorder of kidney and ureter, unspecified: Secondary | ICD-10-CM

## 2011-06-17 DIAGNOSIS — D649 Anemia, unspecified: Secondary | ICD-10-CM

## 2011-06-17 DIAGNOSIS — F32A Depression, unspecified: Secondary | ICD-10-CM

## 2011-06-17 DIAGNOSIS — D509 Iron deficiency anemia, unspecified: Secondary | ICD-10-CM

## 2011-06-17 DIAGNOSIS — F329 Major depressive disorder, single episode, unspecified: Secondary | ICD-10-CM

## 2011-06-17 LAB — IRON AND TIBC
%SAT: 27 % (ref 20–55)
TIBC: 289 ug/dL (ref 250–470)

## 2011-06-17 LAB — CBC WITH DIFFERENTIAL (CANCER CENTER ONLY)
EOS%: 1.6 % (ref 0.0–7.0)
LYMPH#: 2 10*3/uL (ref 0.9–3.3)
MONO#: 0.3 10*3/uL (ref 0.1–0.9)
MONO%: 7.6 % (ref 0.0–13.0)
NEUT#: 2.1 10*3/uL (ref 1.5–6.5)
NEUT%: 45.9 % (ref 39.6–80.0)
Platelets: 276 10*3/uL (ref 145–400)
RDW: 12.6 % (ref 11.1–15.7)
WBC: 4.5 10*3/uL (ref 3.9–10.0)

## 2011-06-17 LAB — FERRITIN: Ferritin: 418 ng/mL — ABNORMAL HIGH (ref 10–291)

## 2011-06-17 NOTE — Progress Notes (Signed)
This office note has been dictated.

## 2011-06-17 NOTE — Telephone Encounter (Signed)
Mailed September appointment

## 2011-06-18 NOTE — Progress Notes (Signed)
CC:   Tinnie Gens A. [name], MD  DIAGNOSES: 1. Anemia of renal insufficiency. 2. Intermittent iron deficiency anemia.  CURRENT THERAPY: 1. Aranesp 300 mcg subcu as needed for hemoglobin less than 10. 2. IV iron as indicated.  INTERIM HISTORY:  Ms. Ciresi comes in for followup.  She feels quite well.  We last saw her back in January.  Since then, she is still working over at [employer].  She is enjoying this.  She does have a 2nd job. 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  Of note, we have not had to give her iron now for about 3 years.  When we last saw her, her ferritin was 480.  She last got Aranesp back in September of 2012.  She has had no problems with bleeding.  She does not have her monthly cycles.  She has had no abdominal pain.  There has been no cough.  There has been no weakness.  There has been no leg swelling.  PHYSICAL EXAM:  This is a well-developed, well-nourished black female in no obvious distress.  Vital signs:  97.6, pulse 79, respiratory rate 22, blood pressure 102/69, weight is 237.  Head/Neck:  Normocephalic, atraumatic skull.  There are no ocular or oral lesions.  There are no palpable cervical or supraclavicular lymph nodes.  Lungs:  Clear to percussion and auscultation bilaterally.  Cardiac:  Regular rate and rhythm with a normal S1, S2.  There are no murmurs, rubs or bruits. Abdomen:  Soft with good bowel sounds.  There is no palpable abdominal mass.  There is no fluid wave.  There is no palpable hepatosplenomegaly. Back:  No tenderness over the spine, ribs, or hips.  Extremities :  No clubbing, cyanosis or edema.  Neurological:  No focal neurological deficits.  LABORATORY STUDIES:  White cell count is 4.5, hemoglobin 10.4, hematocrit 33.5, platelet count 276.  IMPRESSION:  Ms. Chamberlain is a 51 year old [name] with anemia of renal insufficiency.  She is doing quite well.  She does not need any Aranesp.   She is asymptomatic.  Will go ahead and plan to get her back in 4 more months.  I do not see that we need to do any blood work in between visits.   ______________________________ Josph Macho, M.D. PRE/MEDQ  D:  06/17/2011  T:  06/18/2011  Job:  [id number]

## 2011-06-20 ENCOUNTER — Telehealth: Payer: Self-pay | Admitting: *Deleted

## 2011-06-20 NOTE — Telephone Encounter (Signed)
Called patient to let her know that her labwork was ok per dr. ennever 

## 2011-06-20 NOTE — Telephone Encounter (Signed)
Message copied by Anselm Jungling on Thu Jun 20, 2011 12:49 PM ------      Message from: Arlan Organ R      Created: Wed Jun 19, 2011  9:12 PM       Call - iron is ok!!  pete

## 2011-09-11 ENCOUNTER — Encounter (HOSPITAL_COMMUNITY): Payer: Self-pay | Admitting: *Deleted

## 2011-09-11 ENCOUNTER — Emergency Department (HOSPITAL_COMMUNITY)
Admission: EM | Admit: 2011-09-11 | Discharge: 2011-09-11 | Disposition: A | Discharge status: Home / Self Care | Payer: BC Managed Care – PPO | Attending: Emergency Medicine | Admitting: Emergency Medicine

## 2011-09-11 ENCOUNTER — Emergency Department (HOSPITAL_COMMUNITY): Payer: BC Managed Care – PPO

## 2011-09-11 DIAGNOSIS — E039 Hypothyroidism, unspecified: Secondary | ICD-10-CM | POA: Insufficient documentation

## 2011-09-11 DIAGNOSIS — D649 Anemia, unspecified: Secondary | ICD-10-CM | POA: Insufficient documentation

## 2011-09-11 DIAGNOSIS — N390 Urinary tract infection, site not specified: Secondary | ICD-10-CM | POA: Insufficient documentation

## 2011-09-11 DIAGNOSIS — K219 Gastro-esophageal reflux disease without esophagitis: Secondary | ICD-10-CM | POA: Insufficient documentation

## 2011-09-11 DIAGNOSIS — Z87891 Personal history of nicotine dependence: Secondary | ICD-10-CM | POA: Insufficient documentation

## 2011-09-11 LAB — URINALYSIS, ROUTINE W REFLEX MICROSCOPIC
Nitrite: NEGATIVE
Specific Gravity, Urine: 1.006 (ref 1.005–1.030)
Urobilinogen, UA: 1 mg/dL (ref 0.0–1.0)

## 2011-09-11 LAB — CBC
Hemoglobin: 10.7 g/dL — ABNORMAL LOW (ref 12.0–15.0)
MCHC: 32.8 g/dL (ref 30.0–36.0)
RDW: 13.1 % (ref 11.5–15.5)

## 2011-09-11 LAB — BASIC METABOLIC PANEL
GFR calc Af Amer: 65 mL/min — ABNORMAL LOW (ref >90)
GFR calc non Af Amer: 56 mL/min — ABNORMAL LOW (ref >90)
Glucose, Bld: 106 mg/dL — ABNORMAL HIGH (ref 70–99)
Potassium: 3.6 mEq/L (ref 3.5–5.1)
Sodium: 136 mEq/L (ref 135–145)

## 2011-09-11 LAB — URINE MICROSCOPIC-ADD ON

## 2011-09-11 MED ORDER — HYDROCODONE-ACETAMINOPHEN 5-325 MG PO TABS
1.0000 | ORAL_TABLET | Freq: Once | ORAL | Status: DC
  Filled 2011-09-11: qty 1

## 2011-09-11 MED ORDER — HYDROCODONE-ACETAMINOPHEN 5-325 MG PO TABS: 1.0000 | ORAL_TABLET | Freq: Four times a day (QID) | ORAL | Status: AC | PRN

## 2011-09-11 MED ORDER — SULFAMETHOXAZOLE-TMP DS 800-160 MG PO TABS: 1.0000 | ORAL_TABLET | Freq: Once | ORAL | Status: AC

## 2011-09-11 MED ORDER — ACETAMINOPHEN 325 MG PO TABS
650.0000 mg | ORAL_TABLET | Freq: Once | ORAL | Status: AC
  Administered 2011-09-11: 650 mg via ORAL
  Filled 2011-09-11: qty 2

## 2011-09-11 MED ORDER — SULFAMETHOXAZOLE-TMP DS 800-160 MG PO TABS
1.0000 | ORAL_TABLET | Freq: Once | ORAL | Status: AC
  Administered 2011-09-11: 1 via ORAL
  Filled 2011-09-11: qty 1

## 2011-09-11 NOTE — ED Notes (Signed)
Pt to xray

## 2011-09-11 NOTE — ED Provider Notes (Signed)
History     CSN: [id number]  Arrival date & time 09/11/11  0224   First MD Initiated Contact with Patient 09/11/11 [phone]      Chief Complaint  Patient presents with  . Flank Pain    (Consider location/radiation/quality/duration/timing/severity/associated sxs/prior treatment) HPI Comments: Patient complaining of posterior right lower rib discomfort, worse when she moves denies cough, fever, dysuria, urinary tract, frequency, rash.  Injury.  She, said it started acutely last night.  She's been taking all of her normal medications which include, ibuprofen, without any relief of her discomfort.  She, states she did work.  This evening, but it was uncomfortable.  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 female presenting with flank pain. The history is provided by the patient.  Flank Pain This is a new problem. The current episode started yesterday. The problem has been gradually worsening. Pertinent negatives include no congestion, coughing, fever, sore throat, urinary symptoms or weakness. She has tried nothing for the symptoms.    Past Medical History  Diagnosis Date  . Anemia   . Depression   . Hypothyroidism   . DUB (dysfunctional uterine bleeding)   . GERD (gastroesophageal reflux disease)     Past Surgical History  Procedure Date  . Foot surgery     Left    Family History  Problem Relation Age of Onset  . Hypertension Mother   . Asthma Mother   . Colon cancer Neg Hx     History  Substance Use Topics  . Smoking status: Former Games developer  . Smokeless tobacco: Never Used  . Alcohol Use: No    OB History    Grav Para Term Preterm Abortions TAB SAB Ect Mult Living                  Review of Systems  Constitutional: Negative for fever.  HENT: Negative for congestion and sore throat.   Respiratory: Negative for cough.   Genitourinary: Positive for flank pain.  Negative for dysuria and urgency.  Neurological: Negative for dizziness and weakness.    Allergies  Review of patient's allergies indicates no known allergies.  Home Medications   Current Outpatient Rx  Name Route Sig Dispense Refill  . BUPROPION HCL ER (SR) 150 MG PO TB12 Oral Take 150 mg by mouth every morning.     Marland Kitchen ESCITALOPRAM OXALATE 20 MG PO TABS Oral Take 20 mg by mouth daily.      Marland Kitchen HALOPERIDOL 1 MG PO TABS Oral Take 1 mg by mouth every morning.     . IBUPROFEN 800 MG PO TABS Oral Take 800 mg by mouth every 8 (eight) hours as needed. For pain    . LEVOTHYROXINE SODIUM 150 MCG PO TABS  TAKE 1 TABLET BY MOUTH EVERY MORNING 100 tablet 3  . NORTRIPTYLINE HCL 25 MG PO CAPS Oral Take 25 mg by mouth at bedtime.      Marland Kitchen ZOLPIDEM TARTRATE 10 MG PO TABS Oral Take 10 mg by mouth at bedtime as needed. For sleep    . HYDROCODONE-ACETAMINOPHEN 5-325 MG PO TABS Oral Take 1 tablet by mouth every 6 (six) hours as needed for pain. 16 tablet 0  . SULFAMETHOXAZOLE-TMP DS 800-160 MG PO TABS Oral Take 1 tablet by mouth once. 5 tablet 0    BP 137/87  Pulse 91  Temp 99.6 F (37.6 C) (Oral)  Resp  16  SpO2 94%  Physical Exam  Constitutional: She appears well-developed and well-nourished.  HENT:  Head: Normocephalic.  Eyes: Pupils are equal, round, and reactive to light.  Cardiovascular: Normal rate.   Pulmonary/Chest: Effort normal. No respiratory distress. She has no wheezes.  Abdominal: Soft.  Musculoskeletal: Normal range of motion.  Neurological: She is alert.  Skin: Skin is warm and dry.    ED Course  Procedures (including critical care time)  Labs Reviewed  CBC - Abnormal; Notable for the following:    WBC 11.0 (*)     RBC 3.62 (*)     Hemoglobin 10.7 (*)     HCT 32.6 (*)     All other components within normal limits  BASIC METABOLIC PANEL - Abnormal; Notable for the following:    Glucose, Bld 106 (*)     Creatinine, Ser 1.11 (*)     GFR calc non Af Amer 56 (*)     GFR  calc Af Amer 65 (*)     All other components within normal limits  URINALYSIS, ROUTINE W REFLEX MICROSCOPIC - Abnormal; Notable for the following:    Leukocytes, UA MODERATE (*)     All other components within normal limits  URINE MICROSCOPIC-ADD ON - Abnormal; Notable for the following:    Squamous Epithelial / LPF FEW (*)     Bacteria, UA FEW (*)     All other components within normal limits  URINE CULTURE   Dg Chest 2 View  09/11/2011  *RADIOLOGY REPORT*  Clinical Data: Right-sided back pain.  CHEST - 2 VIEW  Comparison: 02/01/2006  Findings: Slightly shallow inspiration.  Borderline heart size with normal pulmonary vascularity.  No focal airspace consolidation in the lungs.  No blunting of costophrenic angles.  No pneumothorax. Mediastinal contours appear intact.  Old right rib fractures.  IMPRESSION: No evidence of active pulmonary disease.  Original Report Authenticated By: Marlon Pel, M.D.     1. UTI (lower urinary tract infection)       MDM   After review of urine will treat for UTI and cultured.  Urine, as well.  Patient will be sent home with a prescription for Septra, Vicodin, if needed, and followup with Dr. [name] in one week        Arman Filter, NP 09/11/11 0541  Arman Filter, NP 09/11/11 [phone]

## 2011-09-11 NOTE — ED Notes (Signed)
Pt c/o right flank pain since 8 pm yesterday.  Unable to sleep tonight, denies urinary sx, states been drinking water.

## 2011-09-11 NOTE — ED Notes (Signed)
Pt returned from xray

## 2011-09-11 NOTE — ED Provider Notes (Signed)
Medical screening examination/treatment/procedure(s) were performed by non-physician practitioner and as supervising physician I was immediately available for consultation/collaboration.   Sunnie Nielsen, MD 09/11/11 [phone]

## 2011-09-11 NOTE — ED Notes (Signed)
Lab notified of urine culture add on 

## 2011-09-12 LAB — URINE CULTURE: Special Requests: NORMAL

## 2011-09-13 NOTE — ED Notes (Signed)
+  Urine. Patient treated with Bactrim. Sensitive to same. Per protocol MD. °

## 2011-09-27 ENCOUNTER — Telehealth: Payer: Self-pay | Admitting: Hematology & Oncology

## 2011-09-27 NOTE — Telephone Encounter (Signed)
Patient called and spoke with RN and cx 10/18/11 appt and resch for 09/30/11 lab and [place] Ana

## 2011-09-30 ENCOUNTER — Ambulatory Visit (INDEPENDENT_AMBULATORY_CARE_PROVIDER_SITE_OTHER): Payer: BC Managed Care – PPO | Admitting: Family Medicine

## 2011-09-30 ENCOUNTER — Other Ambulatory Visit (HOSPITAL_BASED_OUTPATIENT_CLINIC_OR_DEPARTMENT_OTHER): Payer: BC Managed Care – PPO | Admitting: Lab

## 2011-09-30 ENCOUNTER — Encounter: Payer: Self-pay | Admitting: Family Medicine

## 2011-09-30 ENCOUNTER — Ambulatory Visit (HOSPITAL_BASED_OUTPATIENT_CLINIC_OR_DEPARTMENT_OTHER): Payer: BC Managed Care – PPO | Admitting: Medical

## 2011-09-30 VITALS — BP 108/69 | HR 84 | Temp 97.5°F | Resp 18 | Ht 69.0 in | Wt 225.0 lb

## 2011-09-30 VITALS — BP 100/70 | Temp 98.0°F | Wt 227.0 lb

## 2011-09-30 DIAGNOSIS — N189 Chronic kidney disease, unspecified: Secondary | ICD-10-CM

## 2011-09-30 DIAGNOSIS — D509 Iron deficiency anemia, unspecified: Secondary | ICD-10-CM

## 2011-09-30 DIAGNOSIS — D631 Anemia in chronic kidney disease: Secondary | ICD-10-CM

## 2011-09-30 DIAGNOSIS — R0781 Pleurodynia: Secondary | ICD-10-CM

## 2011-09-30 DIAGNOSIS — F32A Depression, unspecified: Secondary | ICD-10-CM

## 2011-09-30 DIAGNOSIS — R42 Dizziness and giddiness: Secondary | ICD-10-CM

## 2011-09-30 DIAGNOSIS — F329 Major depressive disorder, single episode, unspecified: Secondary | ICD-10-CM

## 2011-09-30 DIAGNOSIS — D649 Anemia, unspecified: Secondary | ICD-10-CM

## 2011-09-30 DIAGNOSIS — N289 Disorder of kidney and ureter, unspecified: Secondary | ICD-10-CM

## 2011-09-30 DIAGNOSIS — R079 Chest pain, unspecified: Secondary | ICD-10-CM

## 2011-09-30 LAB — CBC WITH DIFFERENTIAL (CANCER CENTER ONLY)
BASO%: 0.2 % (ref 0.0–2.0)
EOS%: 0.8 % (ref 0.0–7.0)
HCT: 32.4 % — ABNORMAL LOW (ref 34.8–46.6)
LYMPH#: 1.6 10*3/uL (ref 0.9–3.3)
LYMPH%: 32.9 % (ref 14.0–48.0)
MCH: 28.9 pg (ref 26.0–34.0)
MCHC: 31.5 g/dL — ABNORMAL LOW (ref 32.0–36.0)
MCV: 92 fL (ref 81–101)
MONO%: 8.6 % (ref 0.0–13.0)
NEUT%: 57.5 % (ref 39.6–80.0)
RDW: 12.7 % (ref 11.1–15.7)

## 2011-09-30 NOTE — Progress Notes (Signed)
Diagnoses: #1 anemia of renal insufficiency. #2 intermittent iron deficiency anemia.  Current therapy: #1 Aranesp 300 mcg subcutaneous as needed.  For hemoglobin less than 10. #2 IV iron as indicated.  Interim history: Ms. Pita presents today for an office followup visit.  Overall, she, reports, that she's been doing relatively well.  She states, that she had a recent fall.  She was standing up from a lying position and became extremely dizzy and fell in her closet.  She states, that since her fall, which happened about 4 days ago, she has had some lower back pain.  She is going to see Dr. [name] today for this issue.  Otherwise, she has not reported any new problems.  We have not had to give her iron for about 3 years now.  When we saw her last back in may, her ferritin was 418.  Iron 77, with 27% saturation.  She last got Aranesp.  Back in September of 2012.  She does not report any problems with bleeding.  She does not report any abdominal pain.  She denies any cough, fevers, chills, night sweats.  She denies any leg swelling.  She denies any rashes.  Review of Systems: Pt. Denies any changes in their vision, hearing, adenopathy, fevers, chills, nausea, vomiting, diarrhea, constipation, chest pain, shortness of breath, passing blood, passing out, blacking out,  any changes in skin, joints, neurologic or psychiatric except as noted.   Physical Exam: This is a pleasant, 51 year old, would've up well-nourished, African American, female, in no obvious distress Vitals: Temperature 97.5, pulse 84, respirations 18, blood pressure 108/69, weight 225 pounds HEENT reveals a normocephalic, atraumatic skull, no scleral icterus, no oral lesions  Neck is supple without any cervical or supraclavicular adenopathy.  Lungs are clear to auscultation bilaterally. There are no wheezes, rales or rhonci Cardiac is regular rate and rhythm with a normal S1 and S2. There are no murmurs, rubs, or bruits.  Abdomen is  soft with good bowel sounds, there is no palpable mass. There is no palpable hepatosplenomegaly. There is no palpable fluid wave.  Musculoskeletal no tenderness of the spine, ribs, or hips.  Extremities there are no clubbing, cyanosis, or edema.  Skin no petechia, purpura or ecchymosis Neurologic is nonfocal.  Laboratory Data: White count 4.9, hemoglobin 10.2, hematocrit 32.4, MCV 92, platelet count 295,000 Iron panel, and ferritin are pending from today  Current Outpatient Prescriptions on File Prior to Visit  Medication Sig Dispense Refill  . buPROPion (WELLBUTRIN SR) 150 MG 12 hr tablet Take 150 mg by mouth every morning.       . escitalopram (LEXAPRO) 20 MG tablet Take 20 mg by mouth daily.        . haloperidol (HALDOL) 1 MG tablet Take 1 mg by mouth every morning.       Marland Kitchen ibuprofen (ADVIL,MOTRIN) 800 MG tablet Take 800 mg by mouth every 8 (eight) hours as needed. For pain      . levothyroxine (SYNTHROID, LEVOTHROID) 150 MCG tablet TAKE 1 TABLET BY MOUTH EVERY MORNING  100 tablet  3  . nortriptyline (PAMELOR) 25 MG capsule Take 25 mg by mouth at bedtime.        Marland Kitchen omeprazole (PRILOSEC) 20 MG capsule 20 mg daily.       Marland Kitchen zolpidem (AMBIEN) 10 MG tablet Take 10 mg by mouth at bedtime as needed. For sleep       Current Facility-Administered Medications on File Prior to Visit  Medication Dose Route  Frequency Provider Last Rate Last Dose  . DISCONTD: 0.9 %  sodium chloride infusion  500 mL Intravenous Continuous Meryl Dare, MD,FACG       Assessment/Plan: This is a pleasant, 52 year old, African American, female, with the following issues: #1 anemia of renal insufficiency-Mrs. importance hemoglobin is holding stable slightly above 10.  We will not give her an Aranesp injection today.  #2 intermittent iron deficiency anemia-we will await her iron panel.  She has not had to have iron in about 3 years.  We will continue to monitor this.  #3 followup-Ms. Milliman will follow back up with [place]  in 4 months, but before then should there be questions or concerns.

## 2011-09-30 NOTE — Progress Notes (Signed)
  Subjective:    Patient ID: Karen [name], female    DOB: 1961/01/29, 51 y.o.   MRN: [id number]  HPI  Patient seen today with some dizziness and right lower rib cage pain. Recent history is that she went to emergency room on May 13 with some flank pain and dysuria. Positive urine culture for Escherichia coli treated with Septra. Urinary symptoms improved.  She never had any fever or chills or nausea or vomiting. Friday on 09/27/2011 she was getting up out of closet and stood up quickly felt dizziness and not sure if she loss consciousness but fell over. Fell against right side.  She has history of chronic normocytic anemia followed by hematology with hemoglobin earlier today 10.2. She has not take any blood pressure medications. She does take nortriptyline 25 mg at night per psychiatry. No other anticholinergic drugs. No history of iron deficiency. Colonoscopy at age 89 unremarkable  No recent nausea or vomiting. No diarrhea. Hydrating well. No recurrent urinary symptoms  Past Medical History  Diagnosis Date  . Anemia   . Depression   . Hypothyroidism   . DUB (dysfunctional uterine bleeding)   . GERD (gastroesophageal reflux disease)    Past Surgical History  Procedure Date  . Foot surgery     Left    reports that she has quit smoking. She has never used smokeless tobacco. She reports that she does not drink alcohol or use illicit drugs. family history includes Asthma in her mother and Hypertension in her mother.  There is no history of Colon cancer. No Known Allergies    Review of Systems  Constitutional: Negative for fever and appetite change.  Respiratory: Negative for cough.   Cardiovascular: Negative for chest pain.  Gastrointestinal: Negative for abdominal pain.  Neurological: Positive for dizziness and light-headedness. Negative for seizures, syncope, weakness and headaches.       Objective:   Physical Exam  Constitutional: She is oriented to person, place, and time.  She appears well-developed and well-nourished.  HENT:  Mouth/Throat: Oropharynx is clear and moist.  Neck: Neck supple. No thyromegaly present.  Cardiovascular: Normal rate and regular rhythm.  Exam reveals no gallop.   Pulmonary/Chest: Effort normal and breath sounds normal. No respiratory distress. She has no wheezes. She has no rales.  Abdominal: Soft. She exhibits no distension and no mass. There is no tenderness. There is no rebound and no guarding.  Musculoskeletal: She exhibits no edema.       Tender over right anterior lower rib cage somewhat diffusely  Neurological: She is alert and oriented to person, place, and time. No cranial nerve deficit.  Skin: No rash noted.          Assessment & Plan:  #1 dizziness. Patient describes some probable orthostasis intermittently. Blood pressure by my reading left arm seated 90/60 and standing 80/58. Possibly related to her nortriptyline. She will discuss with psychiatry whether she can come off this medication. She has anemia which is chronic and unchanged. Does not appear clinically dehydrated. She's encouraged to change positions slowly #2 right anterior lower rib cage pain. Offered x-rays. She's had remote history of rib fractures previously. Her pain is fairly tolerable this point in she is aware this would not change treatment if she had any rib fracture. She wishes to observe this time.

## 2011-09-30 NOTE — Patient Instructions (Addendum)
Stay well hydrated. Change positions with getting up slowly.

## 2011-10-08 ENCOUNTER — Telehealth: Payer: Self-pay | Admitting: Family Medicine

## 2011-10-08 DIAGNOSIS — R0781 Pleurodynia: Secondary | ICD-10-CM

## 2011-10-08 NOTE — Telephone Encounter (Signed)
Pt states she was seen by Dr. Caryl Never on last Monday due to fall and suspected fracture of ribs.  States she was given a script for pain meds which has run out.  Pt states the pain is still severe and is requesting an order for a MRI.

## 2011-10-08 NOTE — Telephone Encounter (Signed)
Pt informed on personally identified VM right rib x-rays ordered, please go to [facility] office for x-ray of rib

## 2011-10-08 NOTE — Telephone Encounter (Signed)
MRI would not be recommended to evaluate rib pain.  Would start with right rib films.  OK to order.

## 2011-10-09 ENCOUNTER — Ambulatory Visit (INDEPENDENT_AMBULATORY_CARE_PROVIDER_SITE_OTHER)
Admission: RE | Admit: 2011-10-09 | Discharge: 2011-10-09 | Disposition: A | Discharge status: Home / Self Care | Payer: BC Managed Care – PPO | Source: Ambulatory Visit | Attending: Family Medicine | Admitting: Family Medicine

## 2011-10-09 DIAGNOSIS — R079 Chest pain, unspecified: Secondary | ICD-10-CM

## 2011-10-09 DIAGNOSIS — R0781 Pleurodynia: Secondary | ICD-10-CM

## 2011-10-10 NOTE — Progress Notes (Signed)
Quick Note:  Called and spoke with pt about lab results. ______ 

## 2011-10-18 ENCOUNTER — Ambulatory Visit: Payer: BC Managed Care – PPO | Admitting: Hematology & Oncology

## 2011-10-18 ENCOUNTER — Other Ambulatory Visit: Payer: Self-pay | Admitting: Family Medicine

## 2011-10-18 ENCOUNTER — Other Ambulatory Visit: Payer: BC Managed Care – PPO | Admitting: Lab

## 2011-10-18 DIAGNOSIS — Z1231 Encounter for screening mammogram for malignant neoplasm of breast: Secondary | ICD-10-CM

## 2011-11-18 ENCOUNTER — Ambulatory Visit
Admission: RE | Admit: 2011-11-18 | Discharge: 2011-11-18 | Disposition: A | Discharge status: Home / Self Care | Payer: BC Managed Care – PPO | Source: Ambulatory Visit | Attending: Family Medicine | Admitting: Family Medicine

## 2011-11-18 DIAGNOSIS — Z1231 Encounter for screening mammogram for malignant neoplasm of breast: Secondary | ICD-10-CM

## 2011-11-20 ENCOUNTER — Other Ambulatory Visit: Payer: Self-pay | Admitting: Family Medicine

## 2011-11-20 DIAGNOSIS — R928 Other abnormal and inconclusive findings on diagnostic imaging of breast: Secondary | ICD-10-CM

## 2011-11-26 ENCOUNTER — Ambulatory Visit
Admission: RE | Admit: 2011-11-26 | Discharge: 2011-11-26 | Disposition: A | Discharge status: Home / Self Care | Payer: BC Managed Care – PPO | Source: Ambulatory Visit | Attending: Family Medicine | Admitting: Family Medicine

## 2011-11-26 DIAGNOSIS — R928 Other abnormal and inconclusive findings on diagnostic imaging of breast: Secondary | ICD-10-CM

## 2011-11-27 ENCOUNTER — Other Ambulatory Visit: Payer: Self-pay | Admitting: Gastroenterology

## 2011-11-28 NOTE — Telephone Encounter (Signed)
NEEDS OFFICE VISIT FOR ANY FURTHER REFILLS! 

## 2011-12-11 ENCOUNTER — Other Ambulatory Visit (INDEPENDENT_AMBULATORY_CARE_PROVIDER_SITE_OTHER): Payer: BC Managed Care – PPO

## 2011-12-11 DIAGNOSIS — Z0000 Encounter for general adult medical examination without abnormal findings: Secondary | ICD-10-CM

## 2011-12-11 LAB — CBC WITH DIFFERENTIAL/PLATELET
Basophils Relative: 0.4 % (ref 0.0–3.0)
Eosinophils Absolute: 0 10*3/uL (ref 0.0–0.7)
Eosinophils Relative: 0.9 % (ref 0.0–5.0)
HCT: 33.1 % — ABNORMAL LOW (ref 36.0–46.0)
Hemoglobin: 10.4 g/dL — ABNORMAL LOW (ref 12.0–15.0)
MCHC: 31.4 g/dL (ref 30.0–36.0)
MCV: 92.2 fl (ref 78.0–100.0)
Monocytes Absolute: 0.3 10*3/uL (ref 0.1–1.0)
Neutro Abs: 1.9 10*3/uL (ref 1.4–7.7)
RBC: 3.59 Mil/uL — ABNORMAL LOW (ref 3.87–5.11)
WBC: 3.6 10*3/uL — ABNORMAL LOW (ref 4.5–10.5)

## 2011-12-11 LAB — HEPATIC FUNCTION PANEL
AST: 17 U/L (ref 0–37)
Albumin: 4.1 g/dL (ref 3.5–5.2)
Alkaline Phosphatase: 98 U/L (ref 39–117)
Total Protein: 8.1 g/dL (ref 6.0–8.3)

## 2011-12-11 LAB — POCT URINALYSIS DIPSTICK
Blood, UA: NEGATIVE
Glucose, UA: NEGATIVE
Ketones, UA: NEGATIVE
Nitrite, UA: NEGATIVE
Spec Grav, UA: 1.015
pH, UA: 7

## 2011-12-11 LAB — BASIC METABOLIC PANEL
BUN: 10 mg/dL (ref 6–23)
Creatinine, Ser: 1.1 mg/dL (ref 0.4–1.2)
GFR: 69.1 mL/min (ref >60.00)
Glucose, Bld: 105 mg/dL — ABNORMAL HIGH (ref 70–99)

## 2011-12-11 LAB — LIPID PANEL
Cholesterol: 183 mg/dL (ref 0–200)
LDL Cholesterol: 125 mg/dL — ABNORMAL HIGH (ref 0–99)
VLDL: 18.8 mg/dL (ref 0.0–40.0)

## 2011-12-18 ENCOUNTER — Other Ambulatory Visit (HOSPITAL_COMMUNITY)
Admission: RE | Admit: 2011-12-18 | Discharge: 2011-12-18 | Disposition: A | Discharge status: Home / Self Care | Payer: BC Managed Care – PPO | Source: Ambulatory Visit | Attending: Family Medicine | Admitting: Family Medicine

## 2011-12-18 ENCOUNTER — Encounter: Payer: Self-pay | Admitting: Family Medicine

## 2011-12-18 ENCOUNTER — Ambulatory Visit (INDEPENDENT_AMBULATORY_CARE_PROVIDER_SITE_OTHER): Payer: BC Managed Care – PPO | Admitting: Family Medicine

## 2011-12-18 VITALS — BP 150/90 | Temp 98.0°F | Ht 69.5 in | Wt 225.0 lb

## 2011-12-18 DIAGNOSIS — Z23 Encounter for immunization: Secondary | ICD-10-CM

## 2011-12-18 DIAGNOSIS — F3289 Other specified depressive episodes: Secondary | ICD-10-CM

## 2011-12-18 DIAGNOSIS — F329 Major depressive disorder, single episode, unspecified: Secondary | ICD-10-CM

## 2011-12-18 DIAGNOSIS — D72819 Decreased white blood cell count, unspecified: Secondary | ICD-10-CM

## 2011-12-18 DIAGNOSIS — Z01419 Encounter for gynecological examination (general) (routine) without abnormal findings: Secondary | ICD-10-CM | POA: Insufficient documentation

## 2011-12-18 DIAGNOSIS — I10 Essential (primary) hypertension: Secondary | ICD-10-CM

## 2011-12-18 DIAGNOSIS — E039 Hypothyroidism, unspecified: Secondary | ICD-10-CM

## 2011-12-18 MED ORDER — OMEPRAZOLE 20 MG PO CPDR: 20.0000 mg | DELAYED_RELEASE_CAPSULE | Freq: Every day | ORAL | Status: DC

## 2011-12-18 MED ORDER — LEVOTHYROXINE SODIUM 150 MCG PO TABS: 150.0000 ug | ORAL_TABLET | Freq: Every day | ORAL | Status: DC

## 2011-12-18 NOTE — Progress Notes (Signed)
  Subjective:    Patient ID: Karen [name], female    DOB: Mar 13, 1960, 51 y.o.   MRN: [id number]  HPI Karen [name] is a 51 year old single female nonsmoker G0 P0 who had her LMP 2 years ago with minimal postmenopausal symptoms who comes in today for general physical examination because of a history of depression, hypothyroidism, sleep dysfunction, and now hypertension  Her psychiatrist has her on Wellbutrin, Lexapro, Haldol, Pamelor and Ambien. They've been taping the Pamelor because of postural hypotension. At one point she fell because she got lightheaded when she stood up suddenly. Her psychiatrist has said it's related to the Pamelor. She was on 75 mg a day now she is on 50 mg alternating with 25 mg. No more episodes of postural hypotension.  She gets routine eye care, dental care, BSE monthly, and you mammography, colonoscopy normal  She is followed in hematology because of iron deficiency anemia. She's unresponsive to oral iron. She's due to see Dr. Irma [name] at the oncology center for followup. Hemoglobin one week ago was 10.4.  Tetanus booster 2010  She admits to a lot of job stress. 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 That shift ends about 5 PM in the afternoon and then at 7 PM she goes to work at [employer]. 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   Review of Systems  Constitutional: Negative.   HENT: Negative.   Eyes: Negative.   Respiratory: Negative.   Cardiovascular: Negative.   Gastrointestinal: Negative.   Genitourinary: Negative.   Musculoskeletal: Negative.   Neurological: Negative.   Hematological: Negative.   Psychiatric/Behavioral: Negative.        Objective:   Physical Exam  Constitutional: She appears well-developed and well-nourished.  HENT:  Head: Normocephalic and atraumatic.  Right Ear: External ear normal.  Left Ear: External ear normal.  Nose: Nose normal.    Mouth/Throat: Oropharynx is clear and moist.  Eyes: EOM are normal. Pupils are equal, round, and reactive to light.  Neck: Normal range of motion. Neck supple. No thyromegaly present.  Cardiovascular: Normal rate, regular rhythm, normal heart sounds and intact distal pulses.  Exam reveals no gallop and no friction rub.   No murmur heard.      BP right arm sitting position 150/90 standing 120/80  Pulmonary/Chest: Effort normal and breath sounds normal.  Abdominal: Soft. Bowel sounds are normal. She exhibits no distension and no mass. There is no tenderness. There is no rebound.  Genitourinary: Vagina normal and uterus normal. Guaiac negative stool. No vaginal discharge found.  Musculoskeletal: Normal range of motion.  Lymphadenopathy:    She has no cervical adenopathy.  Neurological: She is alert. She has normal reflexes. No cranial nerve deficit. She exhibits normal muscle tone. Coordination normal.  Skin: Skin is warm and dry.  Psychiatric: Her behavior is normal. Judgment and thought content normal.          Assessment & Plan:  Healthy female  History of depression continue followup by psychiatrist  Postural hypotension continue to taper Pamelor  Hypothyroidism continue Synthroid 150 mcg daily  Reflux esophagitis continue Prilosec 20 mg daily  Obesity again and talked about diet exercise and weight loss  Iron deficiency anemia followup by Dr. Irma [name] in hematology

## 2011-12-18 NOTE — Patient Instructions (Signed)
Purchase a digital blood pressure cuff  Check your blood pressure daily in the morning  Continue to slowly taper the Pamelor  I would cut down to one tablet daily  Return in 4 weeks for followup with your blood pressure readings and the new device

## 2011-12-30 ENCOUNTER — Other Ambulatory Visit: Payer: Self-pay

## 2011-12-30 DIAGNOSIS — Z01419 Encounter for gynecological examination (general) (routine) without abnormal findings: Secondary | ICD-10-CM

## 2011-12-30 MED ORDER — OMEPRAZOLE 20 MG PO CPDR: 20.0000 mg | DELAYED_RELEASE_CAPSULE | Freq: Every day | ORAL | Status: DC

## 2011-12-31 ENCOUNTER — Other Ambulatory Visit: Payer: Self-pay | Admitting: Family Medicine

## 2012-01-15 ENCOUNTER — Encounter: Payer: Self-pay | Admitting: Family Medicine

## 2012-01-15 ENCOUNTER — Ambulatory Visit (INDEPENDENT_AMBULATORY_CARE_PROVIDER_SITE_OTHER): Payer: BC Managed Care – PPO | Admitting: Family Medicine

## 2012-01-15 VITALS — BP 130/90 | Temp 97.8°F | Wt 234.0 lb

## 2012-01-15 DIAGNOSIS — J069 Acute upper respiratory infection, unspecified: Secondary | ICD-10-CM

## 2012-01-15 DIAGNOSIS — I10 Essential (primary) hypertension: Secondary | ICD-10-CM

## 2012-01-15 MED ORDER — HYDROCODONE-HOMATROPINE 5-1.5 MG/5ML PO SYRP: ORAL_SOLUTION | ORAL | Status: DC

## 2012-01-15 NOTE — Patient Instructions (Signed)
Drink lots of fluids  Hydromet,,,,,,,,,,,, 1/2-1 teaspoon each bedtime when necessary for cough and cold  To be sure your blood pressure remains normal I would recommend you do a blood pressure at home once weekly  Return for your annual physical exam in one year sooner if any problems

## 2012-01-15 NOTE — Progress Notes (Signed)
  Subjective:    Patient ID: Benjaman Kindler, female    DOB: 1960-10-16, 51 y.o.   MRN: [id number]  HPI cyla is a 51 year old single female nonsmoker who comes in today for followup of suspected hypertension and a new problem of a cough for 5 days  When we saw her for physical examination her blood pressure was elevated. She's been monitoring her blood pressure at home. BP now 130/90.  She's had a cough for the last 5 days no fever no sputum production no shortness of breath. No history of wheezing or asthma or pneumonia. She is a nonsmoker. 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

## 2012-01-24 ENCOUNTER — Telehealth: Payer: Self-pay | Admitting: Hematology & Oncology

## 2012-01-24 NOTE — Telephone Encounter (Signed)
Pt moved 12-23 to 12-30

## 2012-01-27 ENCOUNTER — Other Ambulatory Visit: Payer: BC Managed Care – PPO | Admitting: Lab

## 2012-01-27 ENCOUNTER — Ambulatory Visit: Payer: BC Managed Care – PPO | Admitting: Hematology & Oncology

## 2012-01-27 ENCOUNTER — Ambulatory Visit: Payer: BC Managed Care – PPO | Admitting: Medical

## 2012-02-03 ENCOUNTER — Other Ambulatory Visit (HOSPITAL_BASED_OUTPATIENT_CLINIC_OR_DEPARTMENT_OTHER): Payer: BC Managed Care – PPO | Admitting: Lab

## 2012-02-03 ENCOUNTER — Ambulatory Visit (HOSPITAL_BASED_OUTPATIENT_CLINIC_OR_DEPARTMENT_OTHER): Payer: BC Managed Care – PPO | Admitting: Medical

## 2012-02-03 VITALS — BP 114/73 | HR 78 | Temp 98.0°F | Resp 18 | Ht 69.0 in | Wt 225.0 lb

## 2012-02-03 DIAGNOSIS — D509 Iron deficiency anemia, unspecified: Secondary | ICD-10-CM

## 2012-02-03 DIAGNOSIS — D649 Anemia, unspecified: Secondary | ICD-10-CM

## 2012-02-03 DIAGNOSIS — N289 Disorder of kidney and ureter, unspecified: Secondary | ICD-10-CM

## 2012-02-03 LAB — IRON AND TIBC: Iron: 88 ug/dL (ref 42–145)

## 2012-02-03 LAB — FERRITIN: Ferritin: 331 ng/mL — ABNORMAL HIGH (ref 10–291)

## 2012-02-03 LAB — CBC WITH DIFFERENTIAL (CANCER CENTER ONLY)
EOS%: 1.1 % (ref 0.0–7.0)
MCH: 29.2 pg (ref 26.0–34.0)
MCHC: 31.3 g/dL — ABNORMAL LOW (ref 32.0–36.0)
MONO%: 6.6 % (ref 0.0–13.0)
NEUT#: 2.1 10*3/uL (ref 1.5–6.5)
Platelets: 272 10*3/uL (ref 145–400)
RBC: 3.59 10*6/uL — ABNORMAL LOW (ref 3.70–5.32)
RDW: 12.7 % (ref 11.1–15.7)

## 2012-02-03 NOTE — Progress Notes (Signed)
Diagnoses: #1 anemia of renal insufficiency. #2 intermittent iron deficiency anemia.  Current therapy: #1 Aranesp 300 mcg subcutaneous as needed.  For hemoglobin less than 10. #2 IV iron as indicated.  Interim history: Karen [name] presents today for an office followup visit.  Overall, she, reports, that she's been doing relatively well.  She does not report any new problems or new medications.  We have not had to give her iron for about 3 years now.  When we saw her last back in August, her ferritin was 667.  Iron 66, with 25% saturation.  She last got Aranesp.  Back in September of 2012.  She does not report any problems with bleeding.  She does not report any abdominal pain.  She denies any cough, fevers, chills, night sweats.  She denies any leg swelling.  She denies any rashes.  She has a good appetite.  She denies any nausea, vomiting, diarrhea, constipation.  Overall, her counts have been holding relatively stable for quite some time now.  Review of Systems: Constitutional:Negative for malaise/fatigue, fever, chills, weight loss, diaphoresis, activity change, appetite change, and unexpected weight change.  HEENT: Negative for double vision, blurred vision, visual loss, ear pain, tinnitus, congestion, rhinorrhea, epistaxis sore throat or sinus disease, oral pain/lesion, tongue soreness Respiratory: Negative for cough, chest tightness, shortness of breath, wheezing and stridor.  Cardiovascular: Negative for chest pain, palpitations, leg swelling, orthopnea, PND, DOE or claudication Gastrointestinal: Negative for nausea, vomiting, abdominal pain, diarrhea, constipation, blood in stool, melena, hematochezia, abdominal distention, anal bleeding, rectal pain, anorexia and hematemesis.  Genitourinary: Negative for dysuria, frequency, hematuria,  Musculoskeletal: Negative for myalgias, back pain, joint swelling, arthralgias and gait problem.  Skin: Negative for rash, color change, pallor and wound.    Neurological:. Negative for dizziness/light-headedness, tremors, seizures, syncope, facial asymmetry, speech difficulty, weakness, numbness, headaches and paresthesias.  Hematological: Negative for adenopathy. Does not bruise/bleed easily.  Psychiatric/Behavioral:  Negative for depression, no loss of interest in normal activity or change in sleep pattern.   Physical Exam: This is a pleasant, 51 year old, would've up well-nourished, African American, female, in no obvious distress Vitals: Temperature 90.0 degrees, pulse 70, respirations 18, blood pressure 114/73, weight 225 pounds HEENT reveals a normocephalic, atraumatic skull, no scleral icterus, no oral lesions  Neck is supple without any cervical or supraclavicular adenopathy.  Lungs are clear to auscultation bilaterally. There are no wheezes, rales or rhonci Cardiac is regular rate and rhythm with a normal S1 and S2. There are no murmurs, rubs, or bruits.  Abdomen is soft with good bowel sounds, there is no palpable mass. There is no palpable hepatosplenomegaly. There is no palpable fluid wave.  Musculoskeletal no tenderness of the spine, ribs, or hips.  Extremities there are no clubbing, cyanosis, or edema.  Skin no petechia, purpura or ecchymosis Neurologic is nonfocal.  Laboratory Data: White count 3.8, hemoglobin 10.5, hematocrit 33.6, platelets 272,000  Current Outpatient Prescriptions on File Prior to Visit  Medication Sig Dispense Refill  . buPROPion (WELLBUTRIN SR) 150 MG 12 hr tablet Take 150 mg by mouth every morning.       . escitalopram (LEXAPRO) 20 MG tablet Take 20 mg by mouth daily.        . haloperidol (HALDOL) 1 MG tablet Take 1 mg by mouth every morning.       Marland Kitchen HYDROcodone-homatropine (HYCODAN) 5-1.5 MG/5ML syrup 1/2-1 teaspoon at bedtime when necessary for cough and cold  120 mL  1  . HYDROcodone-homatropine (HYCODAN) 5-1.5  MG/5ML syrup 1/2-1 teaspoon at bedtime when necessary for cough and cold  120 mL  1  .  ibuprofen (ADVIL,MOTRIN) 800 MG tablet Take 800 mg by mouth every 8 (eight) hours as needed. For pain      . levothyroxine (SYNTHROID, LEVOTHROID) 150 MCG tablet TAKE 1 TABLET BY MOUTH EVERY MORNING  90 tablet  4  . nortriptyline (PAMELOR) 25 MG capsule Take 25 mg by mouth at bedtime.        Marland Kitchen omeprazole (PRILOSEC) 20 MG capsule Take 1 capsule (20 mg total) by mouth daily.  30 capsule  0  . zolpidem (AMBIEN) 10 MG tablet Take 10 mg by mouth at bedtime as needed. For sleep       Assessment/Plan: This is a pleasant, 52 year old, African American, female, with the following issues:  #1 anemia of renal insufficiency-Karen [name]'s hemoglobin is holding stable slightly above 10.  We will not give her an Aranesp injection today.  #2 intermittent iron deficiency anemia-we will await her iron panel.  She has not had to have iron in about 3 years.  We will continue to monitor this.  #3 followup-Karen [name] will follow back up with [place] in 4 months, but before then should there be questions or concerns.

## 2012-04-19 ENCOUNTER — Other Ambulatory Visit: Payer: Self-pay | Admitting: Family Medicine

## 2012-04-20 ENCOUNTER — Ambulatory Visit (INDEPENDENT_AMBULATORY_CARE_PROVIDER_SITE_OTHER): Payer: BC Managed Care – PPO | Admitting: Family Medicine

## 2012-04-20 ENCOUNTER — Encounter: Payer: Self-pay | Admitting: Family Medicine

## 2012-04-20 VITALS — BP 100/70 | HR 82 | Temp 99.3°F | Wt 212.0 lb

## 2012-04-20 DIAGNOSIS — J069 Acute upper respiratory infection, unspecified: Secondary | ICD-10-CM

## 2012-04-20 MED ORDER — HYDROCODONE-HOMATROPINE 5-1.5 MG/5ML PO SYRP: 5.0000 mL | ORAL_SOLUTION | Freq: Three times a day (TID) | ORAL | Status: DC | PRN

## 2012-04-20 NOTE — Patient Instructions (Signed)
INSTRUCTIONS FOR UPPER RESPIRATORY INFECTION:  -plenty of rest and fluids  -nasal saline wash 2-3 times daily (use prepackaged nasal saline or bottled/distilled water if making your own)   -clean nose with nasal saline before using the nasal steroid or sinex  -can use tylenol or ibuprofen as directed for aches and sorethroat  -in the winter time, using a humidifier at night is helpful (please follow cleaning instructions)  -if you are taking a cough medication - use only as directed, may also try a teaspoon of honey to coat the throat and throat lozenges  -for sore throat, salt water gargles can help  -follow up if you have fevers, facial pain, tooth pain, difficulty breathing or are worsening or not getting better in 5-7 days

## 2012-04-20 NOTE — Progress Notes (Signed)
Chief Complaint  Patient presents with  . Cough    congestion, rattling inchest, body ahces, no appettie since last Monday     HPI:  Acute visit for cough and congestion: -started: 7 days ago -symptoms:nasal congestion, sore throat, cough, body aches, had some vomiting initially - feeling better except cough is persisting -denies:fever, SOB, NVD, tooth pain -has tried: Geneticist, molecular -sick contacts: husband with similar symptoms but he improved -Hx of: no hx of lung disease  ROS: See pertinent positives and negatives per HPI.  Past Medical History  Diagnosis Date  . Anemia   . Depression   . Hypothyroidism   . DUB (dysfunctional uterine bleeding)   . GERD (gastroesophageal reflux disease)     Family History  Problem Relation Age of Onset  . Hypertension Mother   . Asthma Mother   . Colon cancer Neg Hx     History   Social History  . Marital Status: Married    Spouse Name: N/A    Number of Children: 0  . Years of Education: N/A   Occupational History  .  [employer]   Social History Main Topics  . Smoking status: Former Games developer  . Smokeless tobacco: Never Used  . Alcohol Use: No  . Drug Use: No  . Sexually Active: None   Other Topics Concern  . None   Social History Narrative   Occupation: Estate agent   Regular exercise- yes   0 caffeine drinks     Current outpatient prescriptions:buPROPion (WELLBUTRIN SR) 150 MG 12 hr tablet, Take 150 mg by mouth every morning. , Disp: , Rfl: ;  escitalopram (LEXAPRO) 20 MG tablet, Take 20 mg by mouth daily.  , Disp: , Rfl: ;  haloperidol (HALDOL) 1 MG tablet, Take 1 mg by mouth every morning. , Disp: , Rfl: ;  HYDROcodone-homatropine (HYCODAN) 5-1.5 MG/5ML syrup, 1/2-1 teaspoon at bedtime when necessary for cough and cold, Disp: 120 mL, Rfl: 1 ibuprofen (ADVIL,MOTRIN) 800 MG tablet, Take 800 mg by mouth every 8 (eight) hours as needed. For pain, Disp: , Rfl: ;  levothyroxine (SYNTHROID, LEVOTHROID) 150 MCG tablet, TAKE 1  TABLET BY MOUTH EVERY MORNING, Disp: 90 tablet, Rfl: 4;  nortriptyline (PAMELOR) 25 MG capsule, Take 25 mg by mouth at bedtime.  , Disp: , Rfl: ;  omeprazole (PRILOSEC) 20 MG capsule, Take 1 capsule (20 mg total) by mouth daily., Disp: 30 capsule, Rfl: 0 zolpidem (AMBIEN) 10 MG tablet, Take 10 mg by mouth at bedtime as needed. For sleep, Disp: , Rfl: ;  HYDROcodone-homatropine (HYCODAN) 5-1.5 MG/5ML syrup, Take 5 mLs by mouth every 8 (eight) hours as needed for cough., Disp: 120 mL, Rfl: 0  EXAM:  Filed Vitals:   04/20/12 1227  BP: 100/70  Pulse: 82  Temp: 99.3 F (37.4 C)    Body mass index is 31.29 kg/(m^2).  GENERAL: vitals reviewed and listed above, alert, oriented, appears well hydrated and in no acute distress  HEENT: atraumatic, conjunttiva clear, no obvious abnormalities on inspection of external nose and ears, normal appearance of ear canals and TMs, clear nasal congestion, mild post oropharyngeal erythema with PND, no tonsillar edema or exudate, no sinus TTP  NECK: no obvious masses on inspection  LUNGS: clear to auscultation bilaterally, no wheezes, rales or rhonchi, good air movement  CV: HRRR, no peripheral edema  MS: moves all extremities without noticeable abnormality  PSYCH: pleasant and cooperative, no obvious depression or anxiety  ASSESSMENT AND PLAN:  Discussed the following assessment and  plan:  Upper respiratory infection - Plan: HYDROcodone-homatropine (HYCODAN) 5-1.5 MG/5ML syrup  -discussed that this is likely viral, advised supportive care and return precautions - cough med (risks discussed), advised can work if afebrile -Patient advised to return or notify a doctor immediately if symptoms worsen or persist or new concerns arise.  Patient Instructions  INSTRUCTIONS FOR UPPER RESPIRATORY INFECTION:  -plenty of rest and fluids  -nasal saline wash 2-3 times daily (use prepackaged nasal saline or bottled/distilled water if making your own)   -clean  nose with nasal saline before using the nasal steroid or sinex  -can use tylenol or ibuprofen as directed for aches and sorethroat  -in the winter time, using a humidifier at night is helpful (please follow cleaning instructions)  -if you are taking a cough medication - use only as directed, may also try a teaspoon of honey to coat the throat and throat lozenges  -for sore throat, salt water gargles can help  -follow up if you have fevers, facial pain, tooth pain, difficulty breathing or are worsening or not getting better in 5-7 days      KIM, HANNAH R.

## 2012-06-03 ENCOUNTER — Ambulatory Visit (HOSPITAL_BASED_OUTPATIENT_CLINIC_OR_DEPARTMENT_OTHER): Payer: BC Managed Care – PPO | Admitting: Medical

## 2012-06-03 ENCOUNTER — Ambulatory Visit (HOSPITAL_BASED_OUTPATIENT_CLINIC_OR_DEPARTMENT_OTHER): Payer: BC Managed Care – PPO | Admitting: Lab

## 2012-06-03 ENCOUNTER — Ambulatory Visit: Payer: BC Managed Care – PPO

## 2012-06-03 DIAGNOSIS — D509 Iron deficiency anemia, unspecified: Secondary | ICD-10-CM

## 2012-06-03 DIAGNOSIS — N289 Disorder of kidney and ureter, unspecified: Secondary | ICD-10-CM

## 2012-06-03 DIAGNOSIS — D649 Anemia, unspecified: Secondary | ICD-10-CM

## 2012-06-03 LAB — CBC WITH DIFFERENTIAL (CANCER CENTER ONLY)
BASO%: 0.2 % (ref 0.0–2.0)
Eosinophils Absolute: 0.1 10*3/uL (ref 0.0–0.5)
MCH: 29.2 pg (ref 26.0–34.0)
MONO%: 9.2 % (ref 0.0–13.0)
NEUT#: 1.9 10*3/uL (ref 1.5–6.5)
Platelets: 267 10*3/uL (ref 145–400)
RBC: 3.59 10*6/uL — ABNORMAL LOW (ref 3.70–5.32)
WBC: 4.3 10*3/uL (ref 3.9–10.0)

## 2012-06-03 LAB — FERRITIN: Ferritin: 338 ng/mL — ABNORMAL HIGH (ref 10–291)

## 2012-06-03 LAB — IRON AND TIBC
%SAT: 20 % (ref 20–55)
Iron: 60 ug/dL (ref 42–145)

## 2012-06-03 NOTE — Progress Notes (Signed)
Diagnoses: #1 anemia of renal insufficiency. #2 intermittent iron deficiency anemia.  Current therapy: #1 Aranesp 300 mcg subcutaneous as needed.  For hemoglobin less than 10. #2 IV iron as indicated.  Interim history: Karen [name] presents today for an office followup visit.  Overall, she, reports, that she's been doing relatively well.  She does not report any new problems or new medications.  We have not had to give her iron for about 3 years now.  When we saw her last back in December, her ferritin was 331.  Iron 88, with 35% saturation.  She last got Aranesp back in September of 2012.  She does not report any problems with bleeding.  She does not report any abdominal pain.  She denies any cough, fevers, chills, night sweats.  She denies any leg swelling.  She denies any rashes.  She has a good appetite.  She denies any nausea, vomiting, diarrhea, constipation.  Overall, her counts have been holding relatively stable for quite some time now.  Review of Systems: Constitutional:Negative for malaise/fatigue, fever, chills, weight loss, diaphoresis, activity change, appetite change, and unexpected weight change.  HEENT: Negative for double vision, blurred vision, visual loss, ear pain, tinnitus, congestion, rhinorrhea, epistaxis sore throat or sinus disease, oral pain/lesion, tongue soreness Respiratory: Negative for cough, chest tightness, shortness of breath, wheezing and stridor.  Cardiovascular: Negative for chest pain, palpitations, leg swelling, orthopnea, PND, DOE or claudication Gastrointestinal: Negative for nausea, vomiting, abdominal pain, diarrhea, constipation, blood in stool, melena, hematochezia, abdominal distention, anal bleeding, rectal pain, anorexia and hematemesis.  Genitourinary: Negative for dysuria, frequency, hematuria,  Musculoskeletal: Negative for myalgias, back pain, joint swelling, arthralgias and gait problem.  Skin: Negative for rash, color change, pallor and wound.   Neurological:. Negative for dizziness/light-headedness, tremors, seizures, syncope, facial asymmetry, speech difficulty, weakness, numbness, headaches and paresthesias.  Hematological: Negative for adenopathy. Does not bruise/bleed easily.  Psychiatric/Behavioral:  Negative for depression, no loss of interest in normal activity or change in sleep pattern.   Physical Exam: This is a pleasant, 52 year old, would've up well-nourished, African American, female, in no obvious distress Vitals: Temperature 97.9 degrees, pulse 55, respirations 16, blood pressure 123/61, weight 203 pounds HEENT reveals a normocephalic, atraumatic skull, no scleral icterus, no oral lesions  Neck is supple without any cervical or supraclavicular adenopathy.  Lungs are clear to auscultation bilaterally. There are no wheezes, rales or rhonci Cardiac is regular rate and rhythm with a normal S1 and S2. There are no murmurs, rubs, or bruits.  Abdomen is soft with good bowel sounds, there is no palpable mass. There is no palpable hepatosplenomegaly. There is no palpable fluid wave.  Musculoskeletal no tenderness of the spine, ribs, or hips.  Extremities there are no clubbing, cyanosis, or edema.  Skin no petechia, purpura or ecchymosis Neurologic is nonfocal.  Laboratory Data: White count 4.3, hemoglobin 10.5, hematocrit 33.4, platelets 267,000  Current Outpatient Prescriptions on File Prior to Visit  Medication Sig Dispense Refill  . buPROPion (WELLBUTRIN SR) 150 MG 12 hr tablet Take 150 mg by mouth every morning.       . escitalopram (LEXAPRO) 20 MG tablet Take 20 mg by mouth daily.        . haloperidol (HALDOL) 1 MG tablet Take 1 mg by mouth every morning.       Marland Kitchen HYDROcodone-homatropine (HYCODAN) 5-1.5 MG/5ML syrup TAKE 1/2 TO 1 TEASPOON BY MOUTH AT BEDTIME AS NEEDED FOR COUGH  120 mL  1  . HYDROcodone-homatropine (HYCODAN)  5-1.5 MG/5ML syrup Take 5 mLs by mouth every 8 (eight) hours as needed for cough.  120 mL  0  .  ibuprofen (ADVIL,MOTRIN) 800 MG tablet Take 800 mg by mouth every 8 (eight) hours as needed. For pain      . levothyroxine (SYNTHROID, LEVOTHROID) 150 MCG tablet TAKE 1 TABLET BY MOUTH EVERY MORNING  90 tablet  4  . nortriptyline (PAMELOR) 25 MG capsule Take 25 mg by mouth at bedtime.        Marland Kitchen omeprazole (PRILOSEC) 20 MG capsule Take 1 capsule (20 mg total) by mouth daily.  30 capsule  0  . zolpidem (AMBIEN) 10 MG tablet Take 10 mg by mouth at bedtime as needed. For sleep       No current facility-administered medications on file prior to visit.   Assessment/Plan: This is a pleasant, 52 year old, African American, female, with the following issues:  #1 anemia of renal insufficiency-Karen [name]'s hemoglobin is holding stable slightly above 10.  We will not give her an Aranesp injection today.  #2 intermittent iron deficiency anemia-we will await her iron panel.  She has not had to have iron in about 3 years.  We will continue to monitor this.  #3 followup-Karen [name] will follow back up with [place] in 4 months, but before then should there be questions or concerns.

## 2012-06-03 NOTE — Progress Notes (Signed)
Patient comes in today, CBC checked, Aranesp injection not given due to parameters.  Hgb was 10.5 .  Patient made aware, seen by Eunice Blase, PA.

## 2012-06-22 ENCOUNTER — Ambulatory Visit (INDEPENDENT_AMBULATORY_CARE_PROVIDER_SITE_OTHER): Payer: BC Managed Care – PPO | Admitting: Family Medicine

## 2012-06-22 ENCOUNTER — Encounter: Payer: Self-pay | Admitting: Family Medicine

## 2012-06-22 VITALS — BP 140/98 | Temp 98.5°F | Wt 200.0 lb

## 2012-06-22 DIAGNOSIS — K829 Disease of gallbladder, unspecified: Secondary | ICD-10-CM | POA: Insufficient documentation

## 2012-06-22 NOTE — Patient Instructions (Signed)
Complete fat-free diet  We will get you set up to see Dr. Harden Mo,,,,,,, or one of his associates if he's not available,,,,,,,, ASAP for evaluation  In the meantime if you have another episode go directly to the emergency room

## 2012-06-22 NOTE — Progress Notes (Signed)
  Subjective:    Patient ID: Karen [name], female    DOB: Oct 07, 1960, 52 y.o.   MRN: [id number]  HPI Karen [name] is a 52 year old female nonsmoker who comes in today to discuss an emergency room visit at [facility] over the weekend  On Saturday he may the 17th she awoke at 3 clock in the morning with nausea and vomiting. She went to a emergency room at [facility] Lab work showed a normal white count. Her AST and ALT were slightly elevated amylase and lipase were normal. CT scan of the abdomen showed acute cholecystitis. They recommended that she stay and have her gallbladder out. She declined and came back to [place].  This is her first episode of biliary colic  Family history pertinent her mother had gallbladder disease and a subsequent cholecystectomy     Review of Systems    review of systems otherwise negative she feels well today Objective:   Physical Exam  Well-developed well-nourished female no acute distress vital signs stable she's afebrile abdominal exam normal      Assessment & Plan:  Acute cholecystitis plan,,,,,,,

## 2012-06-23 ENCOUNTER — Encounter: Payer: Self-pay | Admitting: Family Medicine

## 2012-06-24 ENCOUNTER — Encounter: Payer: Self-pay | Admitting: Family Medicine

## 2012-06-25 ENCOUNTER — Telehealth: Payer: Self-pay | Admitting: Family Medicine

## 2012-06-25 NOTE — Telephone Encounter (Signed)
Patient Information:  Caller Name: Danaiya  Phone: [phone]  Patient: Karen [name], Karen [name]  Gender: Female  DOB: [birth year]  Age: 52 Years  PCP: Kelle Darting [facility])  Pregnant: No  Office Follow Up:  Does the office need to follow up with this patient?: Yes  Instructions For The Office: FYI.  Patient stated she did not let Dr. [name] know that she had lost 12 lbs recently on the visit for acute gallbladder.  RN wanted to make office aware in case this was significant apart from the gallbladder. Pt has noted is eating less and has had a lack of appetite.  RN Note:  Patient has not had an action since 05/09 except in small soft amounts.  Has increased bulk in diet eating lots of salads.  Is to see surgeon for gallbladder issues next week. Patient is loosing weight -  has lost about 12 lbs recently.  Triaged with care advice given.  Caller demonstrated her understanding and will call back as needed.  Symptoms  Reason For Call & Symptoms: Constipation.  Last BM was on 06/22/12  Reviewed Health History In EMR: Yes  Reviewed Medications In EMR: Yes  Reviewed Allergies In EMR: Yes  Reviewed Surgeries / Procedures: Yes  Date of Onset of Symptoms: 06/19/2012  Treatments Tried: Laxative x 1 and increased bulk in diet  Treatments Tried Worked: No OB / GYN:  LMP: Unknown  Guideline(s) Used:  Constipation  Disposition Per Guideline:   See Within 2 Weeks in Office  Reason For Disposition Reached:   Constipation is a recurrent ongoing problem (i.e., < 3 BMs / week or straining > 25% of the time)  Advice Given:  General Constipation Instructions:  Eat a high fiber diet.  Drink adequate liquids.  Exercise regularly (even a daily 15 minute walk!).  Get into a rhythm - try to have a BM at the same time each day.  Don't ignore your body's signals to have a BM.  Liquids:  Drink 6-8 glasses of water a day (Caution: certain medical conditions require fluid restriction).  Prune juice is  a natural laxative.  Avoid alcohol.  Get into a rhythm:   Try to have a BM at the same time every day. The best time is about 30-60 minutes after breakfast or another meal (Reason: natural increased intestinal activity).  Do not ignore your body's signals to have a BM.  Osmotic Laxatives:  Miralax (polyethylene glycol 3350): Miralax is an "osmotic" agent which means that it binds water and causes water to be retained within the stool. You can use this laxative to treat occasional constipation. Do not use for more than 2 weeks without approval from your doctor. Generally, Miralax produces a bowel movement in 1 to 3 days. Side effects include diarrhea (especially at higher doses). If you are pregnant, discuss with your doctor before using. Available in the [place].  Milk of Magnesia (magnesium hydroxide): This is a mild and generally safe laxative. You can use Milk of Magnesia for short-term treatment of constipation. (Research suggests that Miralax may be more effective.) Dosage is 2 tablespoons (30 ml) PO. Do not use if you have kidney disease.  Call Back If:  Constipation continues (i.e., less than 3 BMs / week or straining more than 25% of the time) after following care advice for constipation for 2 weeks  You become worse  RN Overrode Recommendation:  Follow Up With Office Later  Patient will follow up as needed since disposition is  for office visit in 2 wks caller will follow up as needed.

## 2012-07-01 ENCOUNTER — Encounter (INDEPENDENT_AMBULATORY_CARE_PROVIDER_SITE_OTHER): Payer: Self-pay | Admitting: Surgery

## 2012-07-01 ENCOUNTER — Ambulatory Visit (INDEPENDENT_AMBULATORY_CARE_PROVIDER_SITE_OTHER): Payer: BC Managed Care – PPO | Admitting: Surgery

## 2012-07-01 VITALS — BP 134/76 | HR 68 | Temp 98.6°F | Resp 18 | Ht 70.0 in | Wt 192.0 lb

## 2012-07-01 DIAGNOSIS — K8010 Calculus of gallbladder with chronic cholecystitis without obstruction: Secondary | ICD-10-CM | POA: Insufficient documentation

## 2012-07-01 NOTE — Patient Instructions (Addendum)
Laparoscopic Cholecystectomy Laparoscopic cholecystectomy is surgery to remove the gallbladder. The gallbladder is located slightly to the right of center in the abdomen, behind the liver. It is a concentrating and storage sac for the bile produced in the liver. Bile aids in the digestion and absorption of fats. Gallbladder disease (cholecystitis) is an inflammation of your gallbladder. This condition is usually caused by a buildup of gallstones (cholelithiasis) in your gallbladder. Gallstones can block the flow of bile, resulting in inflammation and pain. In severe cases, emergency surgery may be required. When emergency surgery is not required, you will have time to prepare for the procedure. Laparoscopic surgery is an alternative to open surgery. Laparoscopic surgery usually has a shorter recovery time. Your common bile duct may also need to be examined and explored. Your caregiver will discuss this with you if he or she feels this should be done. If stones are found in the common bile duct, they may be removed. LET YOUR CAREGIVER KNOW ABOUT:  Allergies to food or medicine.  Medicines taken, including vitamins, herbs, eyedrops, over-the-counter medicines, and creams.  Use of steroids (by mouth or creams).  Previous problems with anesthetics or numbing medicines.  History of bleeding problems or blood clots.  Previous surgery.  Other health problems, including diabetes and kidney problems.  Possibility of pregnancy, if this applies. RISKS AND COMPLICATIONS All surgery is associated with risks. Some problems that may occur following this procedure include:  Infection.  Damage to the common bile duct, nerves, arteries, veins, or other internal organs such as the stomach or intestines.  Bleeding.  A stone may remain in the common bile duct. BEFORE THE PROCEDURE  Do not take aspirin for 3 days prior to surgery or blood thinners for 1 week prior to surgery.  Do not eat or drink  anything after midnight the night before surgery.  Let your caregiver know if you develop a cold or other infectious problem prior to surgery.  You should be present 60 minutes before the procedure or as directed. PROCEDURE  You will be given medicine that makes you sleep (general anesthetic). When you are asleep, your surgeon will make several small cuts (incisions) in your abdomen. One of these incisions is used to insert a small, lighted scope (laparoscope) into the abdomen. The laparoscope helps the surgeon see into your abdomen. Carbon dioxide gas will be pumped into your abdomen. The gas allows more room for the surgeon to perform your surgery. Other operating instruments are inserted through the other incisions. Laparoscopic procedures may not be appropriate when:  There is major scarring from previous surgery.  The gallbladder is extremely inflamed.  There are bleeding disorders or unexpected cirrhosis of the liver.  A pregnancy is near term.  Other conditions make the laparoscopic procedure impossible. If your surgeon feels it is not safe to continue with a laparoscopic procedure, he or she will perform an open abdominal procedure. In this case, the surgeon will make an incision to open the abdomen. This gives the surgeon a larger view and field to work within. This may allow the surgeon to perform procedures that sometimes cannot be performed with a laparoscope alone. Open surgery has a longer recovery time. AFTER THE PROCEDURE  You will be taken to the recovery area where a nurse will watch and check your progress.  You may be allowed to go home the same day.  Do not resume physical activities until directed by your caregiver.  You may resume a normal diet and   activities as directed. Document Released: 01/21/2005 Document Revised: 04/15/2011 Document Reviewed: 07/06/2010 [facility] Patient Information 2014 [place], [place]. Fat and Cholesterol Control Diet Cholesterol levels in  your body are determined significantly by your diet. Cholesterol levels may also be related to heart disease. The following material helps to explain this relationship and discusses what you can do to help keep your heart healthy. Not all cholesterol is bad. Low-density lipoprotein (LDL) cholesterol is the "bad" cholesterol. It may cause fatty deposits to build up inside your arteries. High-density lipoprotein (HDL) cholesterol is "good." It helps to remove the "bad" LDL cholesterol from your blood. Cholesterol is a very important risk factor for heart disease. Other risk factors are high blood pressure, smoking, stress, heredity, and weight. The heart muscle gets its supply of blood through the coronary arteries. If your LDL cholesterol is high and your HDL cholesterol is low, you are at risk for having fatty deposits build up in your coronary arteries. This leaves less room through which blood can flow. Without sufficient blood and oxygen, the heart muscle cannot function properly and you may feel chest pains (angina pectoris). When a coronary artery closes up entirely, a part of the heart muscle may die causing a heart attack (myocardial infarction). CHECKING CHOLESTEROL When your caregiver sends your blood to a lab to be examined for cholesterol, a complete lipid (fat) profile may be done. With this test, the total amount of cholesterol and levels of LDL and HDL are determined. Triglycerides are a type of fat that circulates in the blood. They can also be used to determine heart disease risk. The list below describes what the numbers should be: Test: Total Cholesterol.  Less than 200 mg/dl. Test: LDL "bad cholesterol."  Less than 100 mg/dl.  Less than 70 mg/dl if you are at very high risk of a heart attack or sudden cardiac death. Test: HDL "good cholesterol."  Greater than 50 mg/dl for women.  Greater than 40 mg/dl for men. Test: Triglycerides.  Less than 150 mg/dl. CONTROLLING CHOLESTEROL  WITH DIET Although exercise and lifestyle factors are important, your diet is key. That is because certain foods are known to raise cholesterol and others to lower it. The goal is to balance foods for their effect on cholesterol and more importantly, to replace saturated and trans fat with other types of fat, such as monounsaturated fat, polyunsaturated fat, and omega-3 fatty acids. On average, a person should consume no more than 15 to 17 g of saturated fat daily. Saturated and trans fats are considered "bad" fats, and they will raise LDL cholesterol. Saturated fats are primarily found in animal products such as meats, butter, and cream. However, that does not mean you need to give up all your favorite foods. Today, there are good tasting, low-fat, low-cholesterol substitutes for most of the things you like to eat. Choose low-fat or nonfat alternatives. Choose round or loin cuts of red meat. These types of cuts are lowest in fat and cholesterol. Chicken (without the skin), fish, veal, and ground [place] breast are great choices. Eliminate fatty meats, such as hot dogs and salami. Even shellfish have little or no saturated fat. Have a 3 oz (85 g) portion when you eat lean meat, poultry, or fish. Trans fats are also called "partially hydrogenated oils." They are oils that have been scientifically manipulated so that they are solid at room temperature resulting in a longer shelf life and improved taste and texture of foods in which they are added. Trans fats  are found in stick margarine, some tub margarines, cookies, crackers, and baked goods.  When baking and cooking, oils are a great substitute for butter. The monounsaturated oils are especially beneficial since it is believed they lower LDL and raise HDL. The oils you should avoid entirely are saturated tropical oils, such as coconut and palm.  Remember to eat a lot from food groups that are naturally free of saturated and trans fat, including fish, fruit,  vegetables, beans, grains (barley, rice, couscous, bulgur wheat), and pasta (without cream sauces).  IDENTIFYING FOODS THAT LOWER CHOLESTEROL  Soluble fiber may lower your cholesterol. This type of fiber is found in fruits such as apples, vegetables such as broccoli, potatoes, and carrots, legumes such as beans, peas, and lentils, and grains such as barley. Foods fortified with plant sterols (phytosterol) may also lower cholesterol. You should eat at least 2 g per day of these foods for a cholesterol lowering effect.  Read package labels to identify low-saturated fats, trans fat free, and low-fat foods at the supermarket. Select cheeses that have only 2 to 3 g saturated fat per ounce. Use a heart-healthy tub margarine that is free of trans fats or partially hydrogenated oil. When buying baked goods (cookies, crackers), avoid partially hydrogenated oils. Breads and muffins should be made from whole grains (whole-wheat or whole oat flour, instead of "flour" or "enriched flour"). Buy non-creamy canned soups with reduced salt and no added fats.  FOOD PREPARATION TECHNIQUES  Never deep-fry. If you must fry, either stir-fry, which uses very little fat, or use non-stick cooking sprays. When possible, broil, bake, or roast meats, and steam vegetables. Instead of putting butter or margarine on vegetables, use lemon and herbs, applesauce, and cinnamon (for squash and sweet potatoes), nonfat yogurt, salsa, and low-fat dressings for salads.  LOW-SATURATED FAT / LOW-FAT FOOD SUBSTITUTES Meats / Saturated Fat (g)  Avoid: Steak, marbled (3 oz/85 g) / 11 g  Choose: Steak, lean (3 oz/85 g) / 4 g  Avoid: Hamburger (3 oz/85 g) / 7 g  Choose: Hamburger, lean (3 oz/85 g) / 5 g  Avoid: Ham (3 oz/85 g) / 6 g  Choose: Ham, lean cut (3 oz/85 g) / 2.4 g  Avoid: Chicken, with skin, dark meat (3 oz/85 g) / 4 g  Choose: Chicken, skin removed, dark meat (3 oz/85 g) / 2 g  Avoid: Chicken, with skin, light meat (3 oz/85 g)  / 2.5 g  Choose: Chicken, skin removed, light meat (3 oz/85 g) / 1 g Dairy / Saturated Fat (g)  Avoid: Whole milk (1 cup) / 5 g  Choose: Low-fat milk, 2% (1 cup) / 3 g  Choose: Low-fat milk, 1% (1 cup) / 1.5 g  Choose: Skim milk (1 cup) / 0.3 g  Avoid: Hard cheese (1 oz/28 g) / 6 g  Choose: Skim milk cheese (1 oz/28 g) / 2 to 3 g  Avoid: Cottage cheese, 4% fat (1 cup) / 6.5 g  Choose: Low-fat cottage cheese, 1% fat (1 cup) / 1.5 g  Avoid: Ice cream (1 cup) / 9 g  Choose: Sherbet (1 cup) / 2.5 g  Choose: Nonfat frozen yogurt (1 cup) / 0.3 g  Choose: Frozen fruit bar / trace  Avoid: Whipped cream (1 tbs) / 3.5 g  Choose: Nondairy whipped topping (1 tbs) / 1 g Condiments / Saturated Fat (g)  Avoid: Mayonnaise (1 tbs) / 2 g  Choose: Low-fat mayonnaise (1 tbs) / 1 g  Avoid: Butter (1  tbs) / 7 g  Choose: Extra light margarine (1 tbs) / 1 g  Avoid: Coconut oil (1 tbs) / 11.8 g  Choose: Olive oil (1 tbs) / 1.8 g  Choose: Corn oil (1 tbs) / 1.7 g  Choose: Safflower oil (1 tbs) / 1.2 g  Choose: Sunflower oil (1 tbs) / 1.4 g  Choose: Soybean oil (1 tbs) / 2.4 g  Choose: Canola oil (1 tbs) / 1 g Document Released: 01/21/2005 Document Revised: 04/15/2011 Document Reviewed: 07/12/2010 [employer] Patient Information 2014 [place], [place].

## 2012-07-01 NOTE — Progress Notes (Signed)
Patient ID: Karen [name], female   DOB: 12/30/1960, 52 y.o.   MRN: [id number]  Chief Complaint  Patient presents with  . New Evaluation    G.B    HPI Karen [name] is a 52 y.o. female.  Patient said at request of Dr. Todd for epigastric abdominal pain, nausea and vomiting. She was vacationing in [place] 2 weeks ago and after eating some fatty greasy foods developed epigastric pain, nausea and vomiting. She's had 2 bouts of that and was asked to seen in the hospital in [place] where she was admitted and found to have acute cholecystitis. She felt better and did not want to have surgery at that time and left. I've reviewed her images from this hospitalization and records. She is an ultrasound shows gallstones with mild gallbladder wall thickening from 2 weeks ago. She denies any significant pain currently but has poor appetite has lost weight. She is able to keep liquids down. HPI  Past Medical History  Diagnosis Date  . Anemia   . Depression   . Hypothyroidism   . DUB (dysfunctional uterine bleeding)   . GERD (gastroesophageal reflux disease)     Past Surgical History  Procedure Laterality Date  . Foot surgery      Left    Family History  Problem Relation Age of Onset  . Hypertension Mother   . Asthma Mother   . Colon cancer Neg Hx   . Cancer Father 67    Social History History  Substance Use Topics  . Smoking status: Former Smoker  . Smokeless tobacco: Never Used  . Alcohol Use: No    No Known Allergies  Current Outpatient Prescriptions  Medication Sig Dispense Refill  . buPROPion (WELLBUTRIN SR) 150 MG 12 hr tablet Take 150 mg by mouth every morning.       . escitalopram (LEXAPRO) 20 MG tablet Take 20 mg by mouth daily.        . haloperidol (HALDOL) 1 MG tablet Take 1 mg by mouth every morning.       . ibuprofen (ADVIL,MOTRIN) 800 MG tablet Take 800 mg by mouth every 8 (eight) hours as needed. For pain      . levothyroxine (SYNTHROID, LEVOTHROID) 150  MCG tablet TAKE 1 TABLET BY MOUTH EVERY MORNING  90 tablet  4  . omeprazole (PRILOSEC) 20 MG capsule Take 1 capsule (20 mg total) by mouth daily.  30 capsule  0  . zolpidem (AMBIEN) 10 MG tablet Take 10 mg by mouth at bedtime as needed. For sleep       No current facility-administered medications for this visit.    Review of Systems Review of Systems  Constitutional: Negative for fever, chills and unexpected weight change.  HENT: Negative for hearing loss, congestion, sore throat, trouble swallowing and voice change.   Eyes: Negative for visual disturbance.  Respiratory: Negative for cough and wheezing.   Cardiovascular: Negative for chest pain, palpitations and leg swelling.  Gastrointestinal: Positive for abdominal pain. Negative for nausea, vomiting, diarrhea, constipation, blood in stool, abdominal distention and anal bleeding.  Genitourinary: Negative for hematuria, vaginal bleeding and difficulty urinating.  Musculoskeletal: Negative for arthralgias.  Skin: Negative for rash and wound.  Neurological: Negative for seizures, syncope and headaches.  Hematological: Negative for adenopathy. Does not bruise/bleed easily.  Psychiatric/Behavioral: Negative for confusion.    Blood pressure 134/76, pulse 68, temperature 98.6 F (37 C), resp. rate 18, height 5' 10" (1.778 m), weight 192 lb (87.091 kg).    Physical Exam Physical Exam  Constitutional: She is oriented to person, place, and time. She appears well-developed and well-nourished.  HENT:  Head: Normocephalic and atraumatic.  Eyes: EOM are normal. Pupils are equal, round, and reactive to light.  Neck: Normal range of motion. Neck supple.  Cardiovascular: Normal rate and regular rhythm.   Pulmonary/Chest: Effort normal and breath sounds normal.  Abdominal: Soft. Bowel sounds are normal. She exhibits no distension. There is no tenderness.  Musculoskeletal: Normal range of motion.  Neurological: She is alert and oriented to person,  place, and time.  Skin: Skin is warm and dry.  Psychiatric: She has a normal mood and affect. Her behavior is normal. Judgment and thought content normal.    Data Reviewed Dr Todd's notes and notes from [place]  Assessment    Chronic cholecystitis with gallstones and symptoms    Plan    Recommend laparoscopic cholecystectomy and cholangiogram.The procedure has been discussed with the patient. Operative and non operative treatments have been discussed. Risks of surgery include bleeding, infection,  Common bile duct injury,  Injury to the stomach,liver, colon,small intestine, abdominal wall,  Diaphragm,  Major blood vessels,  And the need for an open procedure.  Other risks include worsening of medical problems, death,  DVT and pulmonary embolism, and cardiovascular events.   Medical options have also been discussed. The patient has been informed of long term expectations of surgery and non surgical options,  The patient agrees to proceed.         Lijah Bourque A. 07/01/2012, 3:56 PM    

## 2012-07-08 ENCOUNTER — Encounter (HOSPITAL_COMMUNITY): Payer: Self-pay | Admitting: Respiratory Therapy

## 2012-07-13 ENCOUNTER — Ambulatory Visit (INDEPENDENT_AMBULATORY_CARE_PROVIDER_SITE_OTHER): Payer: BC Managed Care – PPO | Admitting: General Surgery

## 2012-07-13 ENCOUNTER — Encounter (HOSPITAL_COMMUNITY): Payer: Self-pay

## 2012-07-13 ENCOUNTER — Encounter (HOSPITAL_COMMUNITY)
Admission: RE | Admit: 2012-07-13 | Discharge: 2012-07-13 | Disposition: A | Discharge status: Home / Self Care | Payer: BC Managed Care – PPO | Source: Ambulatory Visit | Attending: Surgery | Admitting: Surgery

## 2012-07-13 LAB — SURGICAL PCR SCREEN: MRSA, PCR: NEGATIVE

## 2012-07-13 LAB — CBC
HCT: 32.8 % — ABNORMAL LOW (ref 36.0–46.0)
Hemoglobin: 10.7 g/dL — ABNORMAL LOW (ref 12.0–15.0)
MCH: 29.4 pg (ref 26.0–34.0)
MCHC: 32.6 g/dL (ref 30.0–36.0)
MCV: 90.1 fL (ref 78.0–100.0)
RBC: 3.64 MIL/uL — ABNORMAL LOW (ref 3.87–5.11)

## 2012-07-13 MED ORDER — CHLORHEXIDINE GLUCONATE 4 % EX LIQD: 1.0000 "application " | Freq: Once | CUTANEOUS | Status: DC

## 2012-07-13 NOTE — Pre-Procedure Instructions (Signed)
Karen [name]  07/13/2012   Your procedure is scheduled on:  Thursday, June 12th.  Report to [facility] at 1:00 PM. Enter through the Main Entrance, take [facility] [employer] to 3rd floor, Short Stay.  Call this number if you have problems the morning of surgery: [phone]   Remember:   Do not eat food or drink liquids after midnight.   Take these medicines the morning of surgery with A SIP OF WATER: buPROPion (WELLBUTRIN SR), escitalopram (LEXAPRO),haloperidol (HALDOL), levothyroxine (SYNTHROID, LEVOTHROID),omeprazole (PRILOSEC).  Stop taking Aspirin, Coumadin, Plavix, Effient and Herbal medications.  Do not take any NSAIDs ie: Ibuprofen,  Advil,Naproxen or any medication containing Aspirin.   Do not wear jewelry, make-up or nail polish.  Do not wear lotions, powders, or perfumes. You may wear deodorant.  Do not shave 48 hours prior to surgery.   Do not bring valuables to the hospital.  [facility] is not responsible  for any belongings or valuables.  Contacts, dentures or bridgework may not be worn into surgery.  Leave suitcase in the car. After surgery it may be brought to your room.  For patients admitted to the hospital, checkout time is 11:00 AM the day of discharge.   Patients discharged the day of surgery will not be allowed to drive home.  Name and phone number of your driver: -   Special Instructions: Shower using CHG 2 nights before surgery and the night before surgery.  If you shower the day of surgery use CHG.  Use special wash - you have one bottle of CHG for all showers.  You should use approximately 1/3 of the bottle for each shower.   Please read over the following fact sheets that you were given: Pain Booklet, Coughing and Deep Breathing and Surgical Site Infection Prevention

## 2012-07-15 ENCOUNTER — Encounter (HOSPITAL_COMMUNITY): Payer: Self-pay | Admitting: *Deleted

## 2012-07-15 ENCOUNTER — Inpatient Hospital Stay (HOSPITAL_COMMUNITY)
Admission: EM | Admit: 2012-07-15 | Discharge: 2012-07-17 | DRG: 494 | Disposition: A | Discharge status: Home / Self Care | Payer: BC Managed Care – PPO | Attending: Surgery | Admitting: Surgery

## 2012-07-15 ENCOUNTER — Emergency Department (HOSPITAL_COMMUNITY): Payer: BC Managed Care – PPO

## 2012-07-15 DIAGNOSIS — K859 Acute pancreatitis without necrosis or infection, unspecified: Principal | ICD-10-CM | POA: Diagnosis present

## 2012-07-15 DIAGNOSIS — K851 Biliary acute pancreatitis without necrosis or infection: Secondary | ICD-10-CM

## 2012-07-15 DIAGNOSIS — N949 Unspecified condition associated with female genital organs and menstrual cycle: Secondary | ICD-10-CM | POA: Diagnosis present

## 2012-07-15 DIAGNOSIS — K8020 Calculus of gallbladder without cholecystitis without obstruction: Secondary | ICD-10-CM | POA: Diagnosis present

## 2012-07-15 DIAGNOSIS — R7401 Elevation of levels of liver transaminase levels: Secondary | ICD-10-CM | POA: Diagnosis present

## 2012-07-15 DIAGNOSIS — K824 Cholesterolosis of gallbladder: Secondary | ICD-10-CM

## 2012-07-15 DIAGNOSIS — R7402 Elevation of levels of lactic acid dehydrogenase (LDH): Secondary | ICD-10-CM | POA: Diagnosis present

## 2012-07-15 DIAGNOSIS — K219 Gastro-esophageal reflux disease without esophagitis: Secondary | ICD-10-CM | POA: Diagnosis present

## 2012-07-15 DIAGNOSIS — Z87891 Personal history of nicotine dependence: Secondary | ICD-10-CM

## 2012-07-15 DIAGNOSIS — K8010 Calculus of gallbladder with chronic cholecystitis without obstruction: Secondary | ICD-10-CM

## 2012-07-15 DIAGNOSIS — E039 Hypothyroidism, unspecified: Secondary | ICD-10-CM | POA: Diagnosis present

## 2012-07-15 DIAGNOSIS — N938 Other specified abnormal uterine and vaginal bleeding: Secondary | ICD-10-CM | POA: Diagnosis present

## 2012-07-15 DIAGNOSIS — D649 Anemia, unspecified: Secondary | ICD-10-CM | POA: Diagnosis present

## 2012-07-15 DIAGNOSIS — F3289 Other specified depressive episodes: Secondary | ICD-10-CM | POA: Diagnosis present

## 2012-07-15 DIAGNOSIS — F329 Major depressive disorder, single episode, unspecified: Secondary | ICD-10-CM | POA: Diagnosis present

## 2012-07-15 LAB — COMPREHENSIVE METABOLIC PANEL
ALT: 165 U/L — ABNORMAL HIGH (ref 0–35)
AST: 178 U/L — ABNORMAL HIGH (ref 0–37)
AST: 257 U/L — ABNORMAL HIGH (ref 0–37)
Albumin: 4.3 g/dL (ref 3.5–5.2)
Albumin: 3.9 g/dL (ref 3.5–5.2)
BUN: 11 mg/dL (ref 6–23)
Calcium: 9.8 mg/dL (ref 8.4–10.5)
Calcium: 9.3 mg/dL (ref 8.4–10.5)
Chloride: 105 mEq/L (ref 96–112)
Creatinine, Ser: 1.05 mg/dL (ref 0.50–1.10)
Creatinine, Ser: 0.98 mg/dL (ref 0.50–1.10)
GFR calc non Af Amer: 65 mL/min — ABNORMAL LOW (ref >90)
Sodium: 140 mEq/L (ref 135–145)
Total Bilirubin: 1 mg/dL (ref 0.3–1.2)
Total Protein: 8.2 g/dL (ref 6.0–8.3)
Total Protein: 7.5 g/dL (ref 6.0–8.3)

## 2012-07-15 LAB — CBC
HCT: 36.3 % (ref 36.0–46.0)
HCT: 33.1 % — ABNORMAL LOW (ref 36.0–46.0)
Hemoglobin: 10.6 g/dL — ABNORMAL LOW (ref 12.0–15.0)
MCH: 28.5 pg (ref 26.0–34.0)
MCH: 28.9 pg (ref 26.0–34.0)
MCHC: 31.4 g/dL (ref 30.0–36.0)
MCV: 90.8 fL (ref 78.0–100.0)
MCV: 90.2 fL (ref 78.0–100.0)
Platelets: 234 10*3/uL (ref 150–400)
RBC: 3.67 MIL/uL — ABNORMAL LOW (ref 3.87–5.11)
RDW: 13.1 % (ref 11.5–15.5)
WBC: 8.1 10*3/uL (ref 4.0–10.5)

## 2012-07-15 LAB — LIPASE, BLOOD: Lipase: 263 U/L — ABNORMAL HIGH (ref 11–59)

## 2012-07-15 MED ORDER — ONDANSETRON HCL 4 MG/2ML IJ SOLN
4.0000 mg | Freq: Once | INTRAMUSCULAR | Status: AC
  Administered 2012-07-15: 4 mg via INTRAVENOUS
  Filled 2012-07-15: qty 2

## 2012-07-15 MED ORDER — CIPROFLOXACIN IN D5W 400 MG/200ML IV SOLN
400.0000 mg | Freq: Two times a day (BID) | INTRAVENOUS | Status: DC
  Administered 2012-07-15 – 2012-07-17 (×4): 400 mg via INTRAVENOUS
  Filled 2012-07-15 (×5): qty 200

## 2012-07-15 MED ORDER — PANTOPRAZOLE SODIUM 40 MG PO TBEC
40.0000 mg | DELAYED_RELEASE_TABLET | Freq: Every day | ORAL | Status: DC
  Administered 2012-07-15 – 2012-07-17 (×3): 40 mg via ORAL
  Filled 2012-07-15 (×3): qty 1

## 2012-07-15 MED ORDER — OXYCODONE-ACETAMINOPHEN 5-325 MG PO TABS
1.0000 | ORAL_TABLET | Freq: Once | ORAL | Status: AC
  Administered 2012-07-15: 1 via ORAL
  Filled 2012-07-15: qty 1

## 2012-07-15 MED ORDER — SODIUM CHLORIDE 0.9 % IV SOLN
Freq: Once | INTRAVENOUS | Status: AC
  Administered 2012-07-15: 500 mL via INTRAVENOUS

## 2012-07-15 MED ORDER — HEPARIN SODIUM (PORCINE) 5000 UNIT/ML IJ SOLN
5000.0000 [IU] | Freq: Three times a day (TID) | INTRAMUSCULAR | Status: DC
  Administered 2012-07-15 – 2012-07-17 (×4): 5000 [IU] via SUBCUTANEOUS
  Filled 2012-07-15 (×9): qty 1

## 2012-07-15 MED ORDER — DIPHENHYDRAMINE HCL 12.5 MG/5ML PO ELIX: 12.5000 mg | ORAL_SOLUTION | Freq: Four times a day (QID) | ORAL | Status: DC | PRN

## 2012-07-15 MED ORDER — MORPHINE SULFATE 4 MG/ML IJ SOLN
6.0000 mg | Freq: Once | INTRAMUSCULAR | Status: AC
  Administered 2012-07-15: 6 mg via INTRAVENOUS
  Filled 2012-07-15: qty 2

## 2012-07-15 MED ORDER — ACETAMINOPHEN 650 MG RE SUPP: 650.0000 mg | Freq: Four times a day (QID) | RECTAL | Status: DC | PRN

## 2012-07-15 MED ORDER — ONDANSETRON 4 MG PO TBDP
8.0000 mg | ORAL_TABLET | Freq: Once | ORAL | Status: AC
  Administered 2012-07-15: 8 mg via ORAL
  Filled 2012-07-15: qty 2

## 2012-07-15 MED ORDER — LACTATED RINGERS IV SOLN
INTRAVENOUS | Status: DC
  Administered 2012-07-16: 50 mL/h via INTRAVENOUS

## 2012-07-15 MED ORDER — CEFAZOLIN SODIUM 10 G IJ SOLR
3.0000 g | INTRAMUSCULAR | Status: AC
  Administered 2012-07-15: 3 g via INTRAVENOUS
  Filled 2012-07-15: qty 3000

## 2012-07-15 MED ORDER — KCL IN DEXTROSE-NACL 30-5-0.45 MEQ/L-%-% IV SOLN
INTRAVENOUS | Status: DC
  Administered 2012-07-15 (×2): via INTRAVENOUS
  Administered 2012-07-16: 100 mL/h via INTRAVENOUS
  Administered 2012-07-17: 05:00:00 via INTRAVENOUS
  Filled 2012-07-15 (×8): qty 1000

## 2012-07-15 MED ORDER — ZOLPIDEM TARTRATE 5 MG PO TABS: 5.0000 mg | ORAL_TABLET | Freq: Every evening | ORAL | Status: DC | PRN

## 2012-07-15 MED ORDER — HALOPERIDOL 1 MG PO TABS
1.0000 mg | ORAL_TABLET | Freq: Every morning | ORAL | Status: DC
  Administered 2012-07-15 – 2012-07-17 (×3): 1 mg via ORAL
  Filled 2012-07-15 (×3): qty 1

## 2012-07-15 MED ORDER — DIPHENHYDRAMINE HCL 50 MG/ML IJ SOLN: 12.5000 mg | Freq: Four times a day (QID) | INTRAMUSCULAR | Status: DC | PRN

## 2012-07-15 MED ORDER — MIDAZOLAM HCL 2 MG/2ML IJ SOLN: 1.0000 mg | INTRAMUSCULAR | Status: DC | PRN

## 2012-07-15 MED ORDER — LEVOTHYROXINE SODIUM 150 MCG PO TABS
150.0000 ug | ORAL_TABLET | Freq: Every day | ORAL | Status: DC
  Administered 2012-07-16 – 2012-07-17 (×2): 150 ug via ORAL
  Filled 2012-07-15 (×3): qty 1

## 2012-07-15 MED ORDER — ACETAMINOPHEN 325 MG PO TABS: 650.0000 mg | ORAL_TABLET | Freq: Four times a day (QID) | ORAL | Status: DC | PRN

## 2012-07-15 MED ORDER — FENTANYL CITRATE 0.05 MG/ML IJ SOLN
50.0000 ug | INTRAMUSCULAR | Status: DC | PRN
  Administered 2012-07-15: 100 ug via INTRAVENOUS
  Filled 2012-07-15: qty 2

## 2012-07-15 MED ORDER — BUPROPION HCL ER (SR) 150 MG PO TB12
150.0000 mg | ORAL_TABLET | Freq: Every morning | ORAL | Status: DC
  Administered 2012-07-16 – 2012-07-17 (×2): 150 mg via ORAL
  Filled 2012-07-15 (×2): qty 1

## 2012-07-15 MED ORDER — HYDROCODONE-ACETAMINOPHEN 5-325 MG PO TABS
1.0000 | ORAL_TABLET | ORAL | Status: DC | PRN
  Administered 2012-07-17: 2 via ORAL
  Filled 2012-07-15: qty 2

## 2012-07-15 MED ORDER — HYDROMORPHONE HCL PF 1 MG/ML IJ SOLN
0.0500 mg | INTRAMUSCULAR | Status: DC | PRN
  Administered 2012-07-16 (×2): 1 mg via INTRAVENOUS
  Filled 2012-07-15 (×2): qty 1

## 2012-07-15 MED ORDER — ESCITALOPRAM OXALATE 20 MG PO TABS
20.0000 mg | ORAL_TABLET | Freq: Every day | ORAL | Status: DC
  Administered 2012-07-15 – 2012-07-17 (×3): 20 mg via ORAL
  Filled 2012-07-15: qty 1
  Filled 2012-07-15 (×2): qty 2

## 2012-07-15 NOTE — ED Notes (Addendum)
Pt to US.

## 2012-07-15 NOTE — ED Provider Notes (Signed)
History     CSN: [id number]  Arrival date & time 07/15/12  1478   First MD Initiated Contact with Patient 07/15/12 0443      Chief Complaint  Patient presents with  . Abdominal Pain    The history is provided by the patient.   patient ports history of gallstones.  She is scheduled for cholecystectomy tomorrow by Dr. Luisa Hart.  She reports developing upper abdominal pain as well as nausea vomiting which has been intermittent through the night and worsened late this evening.  Her pain at this time is moderate in severity.  She has become nauseated in the emergency department and vomited once.  No hematemesis.  No fevers or chills.  No other complaints.  Nothing worsens or improves her symptoms.  She reports her pain feels similar to her prior biliary colic.  No ultrasounds were performed in this hospital it sounds as though her ultrasound imaging was completed in [facility]  Past Medical History  Diagnosis Date  . Anemia   . Depression   . Hypothyroidism   . DUB (dysfunctional uterine bleeding)   . GERD (gastroesophageal reflux disease)     Past Surgical History  Procedure Laterality Date  . Foot surgery      Left    Family History  Problem Relation Age of Onset  . Hypertension Mother   . Asthma Mother   . Colon cancer Neg Hx   . Cancer Father 74    History  Substance Use Topics  . Smoking status: Former Smoker -- 1.00 packs/day for 20 years    Types: Cigarettes    Quit date: 07/14/1987  . Smokeless tobacco: Never Used  . Alcohol Use: No    OB History   Grav Para Term Preterm Abortions TAB SAB Ect Mult Living                  Review of Systems  Gastrointestinal: Positive for abdominal pain.  All other systems reviewed and are negative.    Allergies  Review of patient's allergies indicates no known allergies.  Home Medications   Current Outpatient Rx  Name  Route  Sig  Dispense  Refill  . bisacodyl (DULCOLAX) 5 MG EC tablet   Oral   Take 5 mg by  mouth daily as needed for constipation.         Marland Kitchen buPROPion (WELLBUTRIN SR) 150 MG 12 hr tablet   Oral   Take 150 mg by mouth every morning.          . escitalopram (LEXAPRO) 20 MG tablet   Oral   Take 20 mg by mouth daily.           . haloperidol (HALDOL) 1 MG tablet   Oral   Take 1 mg by mouth every morning.          Marland Kitchen ibuprofen (ADVIL,MOTRIN) 800 MG tablet   Oral   Take 800 mg by mouth every 8 (eight) hours as needed. For pain         . levothyroxine (SYNTHROID, LEVOTHROID) 150 MCG tablet   Oral   Take 150 mcg by mouth daily before breakfast.         . omeprazole (PRILOSEC) 20 MG capsule   Oral   Take 1 capsule (20 mg total) by mouth daily.   30 capsule   0     NEEDS OFFICE VISIT FOR ANY FURTHER REFILLS!!   . zolpidem (AMBIEN) 10 MG tablet   Oral  Take 10 mg by mouth at bedtime as needed. For sleep           BP 146/83  Pulse 91  SpO2 99%  Physical Exam  Nursing note and vitals reviewed. Constitutional: She is oriented to person, place, and time. She appears well-developed and well-nourished. No distress.  HENT:  Head: Normocephalic and atraumatic.  Eyes: EOM are normal.  Neck: Normal range of motion.  Cardiovascular: Normal rate, regular rhythm and normal heart sounds.   Pulmonary/Chest: Effort normal and breath sounds normal.  Abdominal: Soft. She exhibits no distension.  Mild upper abdominal tenderness.  Negative Murphy's sign.  Musculoskeletal: Normal range of motion.  Neurological: She is alert and oriented to person, place, and time.  Skin: Skin is warm and dry.  Psychiatric: She has a normal mood and affect. Judgment normal.    ED Course  Procedures (including critical care time)  Labs Reviewed  CBC - Abnormal; Notable for the following:    Hemoglobin 11.4 (*)    All other components within normal limits  COMPREHENSIVE METABOLIC PANEL - Abnormal; Notable for the following:    AST 178 (*)    ALT 88 (*)    Alkaline Phosphatase  128 (*)    GFR calc non Af Amer 60 (*)    GFR calc Af Amer 69 (*)    All other components within normal limits  LIPASE, BLOOD - Abnormal; Notable for the following:    Lipase 263 (*)    All other components within normal limits   US Abdomen Complete  07/15/2012   *RADIOLOGY REPORT*  Clinical Data:  Abdominal pain with known gallstones.  Question cholecystitis.  COMPLETE ABDOMINAL ULTRASOUND  Comparison:  None.  Findings:  Gallbladder:  There are several shadowing echogenic stones measuring up to 1.1 cm, as well as nonshadowing echogenic sludge. Gallbladder wall measures 6 mm.  Negative sonographic Murphy's sign.  Common bile duct:  Measures 6-9 mm, prominent for age.  Liver:  No focal lesion identified.  Within normal limits in parenchymal echogenicity.  IVC:  Appears normal.  Pancreas:  No focal abnormality seen.  Spleen:  Measures 6.8 cm, negative.  Right Kidney:  Measures 10.4 cm.  Parenchymal echogenicity is normal.  No hydronephrosis.  No focal lesion.  Left Kidney:  Measures 11.2 cm.  Parenchymal echogenicity is normal.  A 1.2 x 1.1 x 1.6 cm anechoic lesion with increased through transmission is seen in the lower pole, consistent with a cyst.  Minimal pelviectasis.  Abdominal aorta:  No aneurysm identified.  IMPRESSION:  1.  Cholelithiasis and sludge with wall thickening.  Negative sonographic Murphy's sign.  Findings can be seen with chronic cholecystitis. 2.  Mildly prominent common bile duct.   Original Report Authenticated By: Leanna Battles, M.D.   I personally reviewed the imaging tests through PACS system I reviewed available ER/hospitalization records through the EMR   1. Gallstone pancreatitis       MDM  7:44 AM Patient reports her pain is improved at this time.  This is likely gallstone pancreatitis with elevated LFTs and lipase as well as a large common bile duct.  General surgery will evaluate the patient the bedside.  N.p.o.         Lyanne Co, MD 07/15/12 [phone]

## 2012-07-15 NOTE — ED Notes (Addendum)
PT returned from US.

## 2012-07-15 NOTE — ED Notes (Signed)
See down time charting after 0:25

## 2012-07-15 NOTE — Progress Notes (Signed)
Messages left on pts home phone and cell phones informing her to be here at 1230 tomorrow instead of 1300 this morning by Dia Crawford RN and by myself at this time.

## 2012-07-15 NOTE — H&P (Signed)
I have seen and examined the patient and agree with the assessment and plans. Hopefully ready for lap chole tomorrow by Dr. Luisa Hart.  He is aware of the admission.  If she is not ready, then I will do lap chole Friday.  Zadie Deemer A. Magnus Ivan  MD, FACS

## 2012-07-15 NOTE — H&P (Signed)
Karen [name] is an 52 y.o. female.   Chief Complaint:  Abdominal pain, nausea, and vomiting with cholelithiasis. HPI: Patient of Dr. [name] seen for epigastric abdominal pain, nausea and vomiting. She was vacationing in [place] 2 weeks ago and after eating some fatty greasy foods developed epigastric pain, nausea and vomiting. She's had 2 bouts and was admitted in [facility], with  acute cholecystitis. She felt better and did not want to have surgery at that time and left. DR. Luisa Hart reviewed her images and records from this hospitalization. She has an ultrasound shows gallstones with mild gallbladder wall thickening from 2 weeks ago. She denies any significant pain currently but has poor appetite has lost weight. She is able to keep liquids down. She was scheduled for surgery tomorrow by Dr. Luisa Hart.  She had dinner last PM and around 9:30 PM had a reoccurance of her pain, nausea, and vomiting..  She is better now with medication, but we plan to admit her place her on antibiotics, clear liquids, recheck labs in AM and aim for surgery tomorrow.  Lipase is up some, but currently she is pain free.   Past Medical History  Diagnosis Date  . Anemia   . Depression   . Hypothyroidism   . DUB (dysfunctional uterine bleeding)   . GERD (gastroesophageal reflux disease)     Past Surgical History  Procedure Laterality Date  . Foot surgery      Left    Family History  Problem Relation Age of Onset  . Hypertension Mother   . Asthma Mother   . Colon cancer Neg Hx   . Cancer Father 30   Social History:  reports that she quit smoking about 25 years ago. Her smoking use included Cigarettes. She has a 20 pack-year smoking history. She has never used smokeless tobacco. She reports that she does not drink alcohol or use illicit drugs.  Allergies: No Known Allergies Prior to Admission medications   Medication Sig Start Date End Date Taking? Authorizing Provider  bisacodyl (DULCOLAX) 5 MG EC  tablet Take 5 mg by mouth daily as needed for constipation.   Yes Historical Provider, MD  buPROPion (WELLBUTRIN SR) 150 MG 12 hr tablet Take 150 mg by mouth every morning.    Yes Historical Provider, MD  escitalopram (LEXAPRO) 20 MG tablet Take 20 mg by mouth daily.     Yes Historical Provider, MD  haloperidol (HALDOL) 1 MG tablet Take 1 mg by mouth every morning.    Yes Historical Provider, MD  ibuprofen (ADVIL,MOTRIN) 800 MG tablet Take 800 mg by mouth every 8 (eight) hours as needed. For pain   Yes Historical Provider, MD  levothyroxine (SYNTHROID, LEVOTHROID) 150 MCG tablet Take 150 mcg by mouth daily before breakfast.   Yes Historical Provider, MD  omeprazole (PRILOSEC) 20 MG capsule Take 1 capsule (20 mg total) by mouth daily. 12/30/11  Yes Meryl Dare, MD  zolpidem (AMBIEN) 10 MG tablet Take 10 mg by mouth at bedtime as needed. For sleep 10/30/10  Yes Historical Provider, MD     (Not in a hospital admission)  Results for orders placed during the hospital encounter of 07/15/12 (from the past 48 hour(s))  CBC     Status: Abnormal   Collection Time    07/15/12  4:52 AM      Result Value Range   WBC 8.1  4.0 - 10.5 K/uL   RBC 4.00  3.87 - 5.11 MIL/uL   Hemoglobin 11.4 (*)  12.0 - 15.0 g/dL   HCT 09.8  11.9 - 14.7 %   MCV 90.8  78.0 - 100.0 fL   MCH 28.5  26.0 - 34.0 pg   MCHC 31.4  30.0 - 36.0 g/dL   RDW 82.9  56.2 - 13.0 %   Platelets 234  150 - 400 K/uL  COMPREHENSIVE METABOLIC PANEL     Status: Abnormal   Collection Time    07/15/12  4:52 AM      Result Value Range   Sodium 141  135 - 145 mEq/L   Potassium 3.8  3.5 - 5.1 mEq/L   Chloride 105  96 - 112 mEq/L   CO2 26  19 - 32 mEq/L   Glucose, Bld 97  70 - 99 mg/dL   BUN 11  6 - 23 mg/dL   Creatinine, Ser 8.65  0.50 - 1.10 mg/dL   Calcium 9.8  8.4 - 78.4 mg/dL   Total Protein 8.2  6.0 - 8.3 g/dL   Albumin 4.3  3.5 - 5.2 g/dL   AST 696 (*) 0 - 37 U/L   ALT 88 (*) 0 - 35 U/L   Alkaline Phosphatase 128 (*) 39 - 117 U/L    Total Bilirubin 1.0  0.3 - 1.2 mg/dL   GFR calc non Af Amer 60 (*) >90 mL/min   GFR calc Af Amer 69 (*) >90 mL/min   Comment:            The eGFR has been calculated     using the CKD EPI equation.     This calculation has not been     validated in all clinical     situations.     eGFR's persistently     <90 mL/min signify     possible Chronic Kidney Disease.  LIPASE, BLOOD     Status: Abnormal   Collection Time    07/15/12  4:52 AM      Result Value Range   Lipase 263 (*) 11 - 59 U/L   US Abdomen Complete  07/15/2012   *RADIOLOGY REPORT*  Clinical Data:  Abdominal pain with known gallstones.  Question cholecystitis.  COMPLETE ABDOMINAL ULTRASOUND  Comparison:  None.  Findings:  Gallbladder:  There are several shadowing echogenic stones measuring up to 1.1 cm, as well as nonshadowing echogenic sludge. Gallbladder wall measures 6 mm.  Negative sonographic Murphy's sign.  Common bile duct:  Measures 6-9 mm, prominent for age.  Liver:  No focal lesion identified.  Within normal limits in parenchymal echogenicity.  IVC:  Appears normal.  Pancreas:  No focal abnormality seen.  Spleen:  Measures 6.8 cm, negative.  Right Kidney:  Measures 10.4 cm.  Parenchymal echogenicity is normal.  No hydronephrosis.  No focal lesion.  Left Kidney:  Measures 11.2 cm.  Parenchymal echogenicity is normal.  A 1.2 x 1.1 x 1.6 cm anechoic lesion with increased through transmission is seen in the lower pole, consistent with a cyst.  Minimal pelviectasis.  Abdominal aorta:  No aneurysm identified.  IMPRESSION:  1.  Cholelithiasis and sludge with wall thickening.  Negative sonographic Murphy's sign.  Findings can be seen with chronic cholecystitis. 2.  Mildly prominent common bile duct.   Original Report Authenticated By: Leanna Battles, M.D.    Review of Systems  Constitutional: Positive for weight loss (33 pound weight loss since her first episode about 1 month ago.). Negative for fever, chills, malaise/fatigue and  diaphoresis.  HENT: Negative.   Eyes: Negative.  Respiratory: Negative.   Cardiovascular: Negative.   Gastrointestinal: Positive for heartburn (on chronic PPI and stable with this.), nausea, vomiting, abdominal pain (mid abdominal pain with onset of sx.) and constipation (some since onset of sx, taking laxitive at home.). Negative for diarrhea, blood in stool and melena.       Nausea and vomiting started with pain about 9:30 PM  Genitourinary: Negative.   Musculoskeletal: Positive for joint pain (Both knees bother her and she take daily ibuprofen).  Skin: Negative.   Neurological: Negative.  Negative for weakness.  Endo/Heme/Allergies: Negative.   Psychiatric/Behavioral: Positive for depression (she is on meds for depression, no change or extenuating circmustances nowl).    Blood pressure 117/63, pulse 71, resp. rate 18, SpO2 98.00%. Physical Exam  Constitutional: She is oriented to person, place, and time. She appears well-developed and well-nourished. No distress.  HENT:  Head: Normocephalic and atraumatic.  Nose: Nose normal.  Tongue white coated.  Eyes: Conjunctivae and EOM are normal. Pupils are equal, round, and reactive to light. Right eye exhibits no discharge. Left eye exhibits no discharge. No scleral icterus.  Neck: Normal range of motion. Neck supple. No JVD present. No tracheal deviation present. No thyromegaly present.  Cardiovascular: Normal rate, regular rhythm, normal heart sounds and intact distal pulses.  Exam reveals no gallop.   No murmur heard. Respiratory: Effort normal and breath sounds normal. No respiratory distress. She has no wheezes. She has no rales. She exhibits no tenderness.  GI: Soft. Bowel sounds are normal. She exhibits no distension and no mass. There is no tenderness. There is no rebound and no guarding.  Currently no pain, nausea, her last emesis was about 0530 this AM  Musculoskeletal: Normal range of motion. She exhibits no edema and no  tenderness.  Neurological: She is alert and oriented to person, place, and time. No cranial nerve deficit.  Skin: Skin is warm and dry. No rash noted. She is not diaphoretic. No erythema. No pallor.  Psychiatric: She has a normal mood and affect. Her behavior is normal. Judgment and thought content normal.     Assessment/Plan 1. Abdominal pain, nausea and vomiting 2.cholelithiasis, with mild lipase and transaminase elevation. 3.Hx of anemia 4.Hx of Hypothyroid on supplement. 5.Depression 6.GERD 7.Dysfunctional uterine bleeding  PlaN:  ADMIT, Hydrate, IV antibiotics, recheck labs in AM, surgery tomorrow if OK by Dr. Luisa Hart.  Will Marlyne Beards PA-C for Dr. Octavio Graves 07/15/2012, 8:29 AM

## 2012-07-15 NOTE — ED Notes (Signed)
MD at bedside. 

## 2012-07-16 ENCOUNTER — Encounter (HOSPITAL_COMMUNITY): Payer: Self-pay | Admitting: Anesthesiology

## 2012-07-16 ENCOUNTER — Encounter (HOSPITAL_COMMUNITY)
Admission: EM | Disposition: A | Discharge status: Home / Self Care | Payer: Self-pay | Source: Home / Self Care | Attending: Surgery

## 2012-07-16 ENCOUNTER — Inpatient Hospital Stay (HOSPITAL_COMMUNITY): Payer: BC Managed Care – PPO | Admitting: Anesthesiology

## 2012-07-16 ENCOUNTER — Ambulatory Visit (HOSPITAL_COMMUNITY): Admission: RE | Admit: 2012-07-16 | Payer: BC Managed Care – PPO | Source: Ambulatory Visit | Admitting: Surgery

## 2012-07-16 ENCOUNTER — Inpatient Hospital Stay (HOSPITAL_COMMUNITY): Payer: BC Managed Care – PPO

## 2012-07-16 HISTORY — PX: CHOLECYSTECTOMY: SHX55

## 2012-07-16 LAB — COMPREHENSIVE METABOLIC PANEL
ALT: 122 U/L — ABNORMAL HIGH (ref 0–35)
Alkaline Phosphatase: 122 U/L — ABNORMAL HIGH (ref 39–117)
BUN: 6 mg/dL (ref 6–23)
CO2: 28 mEq/L (ref 19–32)
Calcium: 9.1 mg/dL (ref 8.4–10.5)
GFR calc Af Amer: 69 mL/min — ABNORMAL LOW (ref >90)
GFR calc non Af Amer: 60 mL/min — ABNORMAL LOW (ref >90)
Glucose, Bld: 107 mg/dL — ABNORMAL HIGH (ref 70–99)
Total Protein: 7.2 g/dL (ref 6.0–8.3)

## 2012-07-16 LAB — CBC
HCT: 32.1 % — ABNORMAL LOW (ref 36.0–46.0)
MCHC: 32.1 g/dL (ref 30.0–36.0)
RDW: 13.1 % (ref 11.5–15.5)
WBC: 3.6 10*3/uL — ABNORMAL LOW (ref 4.0–10.5)

## 2012-07-16 LAB — LIPASE, BLOOD: Lipase: 34 U/L (ref 11–59)

## 2012-07-16 SURGERY — LAPAROSCOPIC CHOLECYSTECTOMY WITH INTRAOPERATIVE CHOLANGIOGRAM
Anesthesia: General | Site: Abdomen | Wound class: Clean Contaminated

## 2012-07-16 MED ORDER — MIDAZOLAM HCL 5 MG/5ML IJ SOLN
INTRAMUSCULAR | Status: DC | PRN
  Administered 2012-07-16: 2 mg via INTRAVENOUS

## 2012-07-16 MED ORDER — BUPIVACAINE-EPINEPHRINE PF 0.25-1:200000 % IJ SOLN
INTRAMUSCULAR | Status: AC
  Filled 2012-07-16: qty 30

## 2012-07-16 MED ORDER — CEFAZOLIN SODIUM-DEXTROSE 2-3 GM-% IV SOLR
INTRAVENOUS | Status: AC
  Administered 2012-07-16: 2 g via INTRAVENOUS
  Filled 2012-07-16: qty 50

## 2012-07-16 MED ORDER — SODIUM CHLORIDE 0.9 % IR SOLN
Status: DC | PRN
  Administered 2012-07-16: 1

## 2012-07-16 MED ORDER — ONDANSETRON HCL 4 MG/2ML IJ SOLN
INTRAMUSCULAR | Status: DC | PRN
  Administered 2012-07-16: 4 mg via INTRAVENOUS

## 2012-07-16 MED ORDER — ROCURONIUM BROMIDE 100 MG/10ML IV SOLN
INTRAVENOUS | Status: DC | PRN
  Administered 2012-07-16: 40 mg via INTRAVENOUS

## 2012-07-16 MED ORDER — CEFAZOLIN SODIUM 1-5 GM-% IV SOLN
INTRAVENOUS | Status: AC
  Filled 2012-07-16: qty 50

## 2012-07-16 MED ORDER — NEOSTIGMINE METHYLSULFATE 1 MG/ML IJ SOLN
INTRAMUSCULAR | Status: DC | PRN
  Administered 2012-07-16: 4 mg via INTRAVENOUS

## 2012-07-16 MED ORDER — BUPIVACAINE-EPINEPHRINE 0.25% -1:200000 IJ SOLN
INTRAMUSCULAR | Status: DC | PRN
  Administered 2012-07-16: 8 mL

## 2012-07-16 MED ORDER — HYDROMORPHONE HCL PF 1 MG/ML IJ SOLN: 0.2500 mg | INTRAMUSCULAR | Status: DC | PRN

## 2012-07-16 MED ORDER — GLYCOPYRROLATE 0.2 MG/ML IJ SOLN
INTRAMUSCULAR | Status: DC | PRN
  Administered 2012-07-16: 0.6 mg via INTRAVENOUS

## 2012-07-16 MED ORDER — LACTATED RINGERS IV SOLN
INTRAVENOUS | Status: DC | PRN
  Administered 2012-07-16 (×2): via INTRAVENOUS

## 2012-07-16 MED ORDER — LIDOCAINE HCL (CARDIAC) 20 MG/ML IV SOLN
INTRAVENOUS | Status: DC | PRN
  Administered 2012-07-16: 75 mg via INTRAVENOUS

## 2012-07-16 MED ORDER — SODIUM CHLORIDE 0.9 % IV SOLN
INTRAVENOUS | Status: DC | PRN
  Administered 2012-07-16: 15:00:00

## 2012-07-16 MED ORDER — FENTANYL CITRATE 0.05 MG/ML IJ SOLN
INTRAMUSCULAR | Status: DC | PRN
  Administered 2012-07-16: 50 ug via INTRAVENOUS
  Administered 2012-07-16: 100 ug via INTRAVENOUS
  Administered 2012-07-16: 150 ug via INTRAVENOUS

## 2012-07-16 MED ORDER — PROPOFOL 10 MG/ML IV BOLUS
INTRAVENOUS | Status: DC | PRN
  Administered 2012-07-16: 160 mg via INTRAVENOUS

## 2012-07-16 SURGICAL SUPPLY — 45 items
ADH SKN CLS APL DERMABOND .7 (GAUZE/BANDAGES/DRESSINGS) ×1
APPLIER CLIP ROT 10 11.4 M/L (STAPLE) ×2
APR CLP MED LRG 11.4X10 (STAPLE) ×1
BAG SPEC RTRVL LRG 6X4 10 (ENDOMECHANICALS) ×1
BLADE SURG ROTATE 9660 (MISCELLANEOUS) IMPLANT
CANISTER SUCTION 2500CC (MISCELLANEOUS) ×2 IMPLANT
CHLORAPREP W/TINT 26ML (MISCELLANEOUS) ×2 IMPLANT
CLIP APPLIE ROT 10 11.4 M/L (STAPLE) ×1 IMPLANT
CLOTH BEACON ORANGE TIMEOUT ST (SAFETY) ×2 IMPLANT
COVER MAYO STAND STRL (DRAPES) ×2 IMPLANT
COVER SURGICAL LIGHT HANDLE (MISCELLANEOUS) ×2 IMPLANT
DECANTER SPIKE VIAL GLASS SM (MISCELLANEOUS) ×2 IMPLANT
DERMABOND ADVANCED (GAUZE/BANDAGES/DRESSINGS) ×1
DERMABOND ADVANCED .7 DNX12 (GAUZE/BANDAGES/DRESSINGS) ×1 IMPLANT
DRAPE C-ARM 42X72 X-RAY (DRAPES) ×2 IMPLANT
DRAPE UTILITY 15X26 W/TAPE STR (DRAPE) ×4 IMPLANT
DRAPE WARM FLUID 44X44 (DRAPE) ×2 IMPLANT
ELECT REM PT RETURN 9FT ADLT (ELECTROSURGICAL) ×2
ELECTRODE REM PT RTRN 9FT ADLT (ELECTROSURGICAL) ×1 IMPLANT
GLOVE BIO SURGEON STRL SZ8 (GLOVE) ×2 IMPLANT
GLOVE BIOGEL PI IND STRL 7.0 (GLOVE) IMPLANT
GLOVE BIOGEL PI IND STRL 8 (GLOVE) ×1 IMPLANT
GLOVE BIOGEL PI INDICATOR 7.0 (GLOVE) ×1
GLOVE BIOGEL PI INDICATOR 8 (GLOVE) ×1
GLOVE SURG SS PI 7.0 STRL IVOR (GLOVE) ×1 IMPLANT
GOWN STRL NON-REIN LRG LVL3 (GOWN DISPOSABLE) ×8 IMPLANT
GOWN STRL REIN XL XLG (GOWN DISPOSABLE) ×2 IMPLANT
KIT BASIN OR (CUSTOM PROCEDURE TRAY) ×2 IMPLANT
KIT ROOM TURNOVER OR (KITS) ×2 IMPLANT
NS IRRIG 1000ML POUR BTL (IV SOLUTION) ×4 IMPLANT
PAD ARMBOARD 7.5X6 YLW CONV (MISCELLANEOUS) ×2 IMPLANT
POUCH SPECIMEN RETRIEVAL 10MM (ENDOMECHANICALS) ×2 IMPLANT
SCISSORS ENDO CVD 5DCS (MISCELLANEOUS) ×1 IMPLANT
SCISSORS LAP 5X35 DISP (ENDOMECHANICALS) IMPLANT
SET CHOLANGIOGRAPH 5 50 .035 (SET/KITS/TRAYS/PACK) ×2 IMPLANT
SET IRRIG TUBING LAPAROSCOPIC (IRRIGATION / IRRIGATOR) ×2 IMPLANT
SLEEVE ENDOPATH XCEL 5M (ENDOMECHANICALS) ×2 IMPLANT
SPECIMEN JAR SMALL (MISCELLANEOUS) ×2 IMPLANT
SUT MNCRL AB 4-0 PS2 18 (SUTURE) ×2 IMPLANT
TOWEL OR 17X24 6PK STRL BLUE (TOWEL DISPOSABLE) ×2 IMPLANT
TOWEL OR 17X26 10 PK STRL BLUE (TOWEL DISPOSABLE) ×2 IMPLANT
TRAY LAPAROSCOPIC (CUSTOM PROCEDURE TRAY) ×2 IMPLANT
TROCAR XCEL BLUNT TIP 100MML (ENDOMECHANICALS) ×2 IMPLANT
TROCAR XCEL NON-BLD 11X100MML (ENDOMECHANICALS) ×2 IMPLANT
TROCAR XCEL NON-BLD 5MMX100MML (ENDOMECHANICALS) ×2 IMPLANT

## 2012-07-16 NOTE — Anesthesia Procedure Notes (Signed)
Date/Time: 07/16/2012 3:07 PM Performed by: Coralee Rud Pre-anesthesia Checklist: Emergency Drugs available, Patient identified, Timeout performed, Suction available and Patient being monitored Patient Re-evaluated:Patient Re-evaluated prior to inductionOxygen Delivery Method: Circle system utilized Preoxygenation: Pre-oxygenation with 100% oxygen Intubation Type: IV induction Ventilation: Mask ventilation without difficulty and Oral airway inserted - appropriate to patient size Laryngoscope Size: Mac and 3 Grade View: Grade I Tube type: Oral Tube size: 7.0 mm Number of attempts: 1 Airway Equipment and Method: Stylet Placement Confirmation: ETT inserted through vocal cords under direct vision,  breath sounds checked- equal and bilateral and positive ETCO2 Secured at: 21 cm Tube secured with: Tape Dental Injury: Teeth and Oropharynx as per pre-operative assessment

## 2012-07-16 NOTE — Anesthesia Preprocedure Evaluation (Signed)
Anesthesia Evaluation  Patient identified by MRN, date of birth, ID band Patient awake    Reviewed: Allergy & Precautions, H&P , NPO status , Patient's Chart, lab work & pertinent test results  Airway Mallampati: II      Dental   Pulmonary neg pulmonary ROS,  breath sounds clear to auscultation        Cardiovascular hypertension, Pt. on medications Rhythm:Regular Rate:Normal     Neuro/Psych Anxiety    GI/Hepatic Neg liver ROS, GERD-  ,  Endo/Other  Hypothyroidism   Renal/GU      Musculoskeletal   Abdominal   Peds  Hematology   Anesthesia Other Findings   Reproductive/Obstetrics                           Anesthesia Physical Anesthesia Plan  ASA: III  Anesthesia Plan: General   Post-op Pain Management:    Induction: Intravenous  Airway Management Planned: Oral ETT  Additional Equipment:   Intra-op Plan:   Post-operative Plan: Extubation in OR  Informed Consent: I have reviewed the patients History and Physical, chart, labs and discussed the procedure including the risks, benefits and alternatives for the proposed anesthesia with the patient or authorized representative who has indicated his/her understanding and acceptance.   Dental advisory given  Plan Discussed with: CRNA, Anesthesiologist and Surgeon  Anesthesia Plan Comments:         Anesthesia Quick Evaluation

## 2012-07-16 NOTE — H&P (View-Only) (Signed)
Patient ID: Karen [name], female   DOB: [birth year], 52 y.o.   MRN: [id number]  Chief Complaint  Patient presents with  . New Evaluation    G.B    HPI Karen [name] is a 52 y.o. female.  Patient said at request of Dr. [name] for epigastric abdominal pain, nausea and vomiting. She was vacationing in [place] 2 weeks ago and after eating some fatty greasy foods developed epigastric pain, nausea and vomiting. She's had 2 bouts of that and was asked to seen in the hospital in [facility] where she was admitted and found to have acute cholecystitis. She felt better and did not want to have surgery at that time and left. I've reviewed her images from this hospitalization and records. She is an ultrasound shows gallstones with mild gallbladder wall thickening from 2 weeks ago. She denies any significant pain currently but has poor appetite has lost weight. She is able to keep liquids down. HPI  Past Medical History  Diagnosis Date  . Anemia   . Depression   . Hypothyroidism   . DUB (dysfunctional uterine bleeding)   . GERD (gastroesophageal reflux disease)     Past Surgical History  Procedure Laterality Date  . Foot surgery      Left    Family History  Problem Relation Age of Onset  . Hypertension Mother   . Asthma Mother   . Colon cancer Neg Hx   . Cancer Father 22    Social History History  Substance Use Topics  . Smoking status: Former Games developer  . Smokeless tobacco: Never Used  . Alcohol Use: No    No Known Allergies  Current Outpatient Prescriptions  Medication Sig Dispense Refill  . buPROPion (WELLBUTRIN SR) 150 MG 12 hr tablet Take 150 mg by mouth every morning.       . escitalopram (LEXAPRO) 20 MG tablet Take 20 mg by mouth daily.        . haloperidol (HALDOL) 1 MG tablet Take 1 mg by mouth every morning.       Marland Kitchen ibuprofen (ADVIL,MOTRIN) 800 MG tablet Take 800 mg by mouth every 8 (eight) hours as needed. For pain      . levothyroxine (SYNTHROID, LEVOTHROID) 150  MCG tablet TAKE 1 TABLET BY MOUTH EVERY MORNING  90 tablet  4  . omeprazole (PRILOSEC) 20 MG capsule Take 1 capsule (20 mg total) by mouth daily.  30 capsule  0  . zolpidem (AMBIEN) 10 MG tablet Take 10 mg by mouth at bedtime as needed. For sleep       No current facility-administered medications for this visit.    Review of Systems Review of Systems  Constitutional: Negative for fever, chills and unexpected weight change.  HENT: Negative for hearing loss, congestion, sore throat, trouble swallowing and voice change.   Eyes: Negative for visual disturbance.  Respiratory: Negative for cough and wheezing.   Cardiovascular: Negative for chest pain, palpitations and leg swelling.  Gastrointestinal: Positive for abdominal pain. Negative for nausea, vomiting, diarrhea, constipation, blood in stool, abdominal distention and anal bleeding.  Genitourinary: Negative for hematuria, vaginal bleeding and difficulty urinating.  Musculoskeletal: Negative for arthralgias.  Skin: Negative for rash and wound.  Neurological: Negative for seizures, syncope and headaches.  Hematological: Negative for adenopathy. Does not bruise/bleed easily.  Psychiatric/Behavioral: Negative for confusion.    Blood pressure 134/76, pulse 68, temperature 98.6 F (37 C), resp. rate 18, height 5\' 10"  (1.778 m), weight 192 lb (87.091 kg).  Physical Exam Physical Exam  Constitutional: She is oriented to person, place, and time. She appears well-developed and well-nourished.  HENT:  Head: Normocephalic and atraumatic.  Eyes: EOM are normal. Pupils are equal, round, and reactive to light.  Neck: Normal range of motion. Neck supple.  Cardiovascular: Normal rate and regular rhythm.   Pulmonary/Chest: Effort normal and breath sounds normal.  Abdominal: Soft. Bowel sounds are normal. She exhibits no distension. There is no tenderness.  Musculoskeletal: Normal range of motion.  Neurological: She is alert and oriented to person,  place, and time.  Skin: Skin is warm and dry.  Psychiatric: She has a normal mood and affect. Her behavior is normal. Judgment and thought content normal.    Data Reviewed Dr Nelida Meuse notes and notes from [facility]  Assessment    Chronic cholecystitis with gallstones and symptoms    Plan    Recommend laparoscopic cholecystectomy and cholangiogram.The procedure has been discussed with the patient. Operative and non operative treatments have been discussed. Risks of surgery include bleeding, infection,  Common bile duct injury,  Injury to the stomach,liver, colon,small intestine, abdominal wall,  Diaphragm,  Major blood vessels,  And the need for an open procedure.  Other risks include worsening of medical problems, death,  DVT and pulmonary embolism, and cardiovascular events.   Medical options have also been discussed. The patient has been informed of long term expectations of surgery and non surgical options,  The patient agrees to proceed.         Damaris Abeln A. 07/01/2012, 3:56 PM

## 2012-07-16 NOTE — Preoperative (Signed)
Beta Blockers   Reason not to administer Beta Blockers:Not Applicable 

## 2012-07-16 NOTE — Op Note (Signed)
Laparoscopic Cholecystectomy with IOC Procedure Note  Indications: This patient presents with symptomatic gallbladder disease and gallstone pancreatitis and will undergo laparoscopic cholecystectomy. The procedure has been discussed with the patient. Operative and non operative treatments have been discussed. Risks of surgery include bleeding, infection,  Common bile duct injury,  Injury to the stomach,liver, colon,small intestine, abdominal wall,  Diaphragm,  Major blood vessels,  And the need for an open procedure.  Other risks include worsening of medical problems, death,  DVT and pulmonary embolism, and cardiovascular events.   Medical options have also been discussed. The patient has been informed of long term expectations of surgery and non surgical options,  The patient agrees to proceed.    Pre-operative Diagnosis: gallstone pancreatitis   Post-operative Diagnosis: Same  Surgeon: Anelise Staron A.   Assistants: none  Anesthesia: General endotracheal anesthesia and Local anesthesia 0.25.% bupivacaine, with epinephrine  ASA Class: 2  Procedure Details  The patient was seen again in the Holding Room. The risks, benefits, complications, treatment options, and expected outcomes were discussed with the patient. The possibilities of reaction to medication, pulmonary aspiration, perforation of viscus, bleeding, recurrent infection, finding a normal gallbladder, the need for additional procedures, failure to diagnose a condition, the possible need to convert to an open procedure, and creating a complication requiring transfusion or operation were discussed with the patient. The patient and/or family concurred with the proposed plan, giving informed consent. The site of surgery properly noted/marked. The patient was taken to Operating Room, identified as Karen [name] and the procedure verified as Laparoscopic Cholecystectomy with Intraoperative Cholangiograms. A Time Out was held and the above  information confirmed.  Prior to the induction of general anesthesia, antibiotic prophylaxis was administered. General endotracheal anesthesia was then administered and tolerated well. After the induction, the abdomen was prepped in the usual sterile fashion. The patient was positioned in the supine position with the left arm comfortably tucked, along with some reverse Trendelenburg.  Local anesthetic agent was injected into the skin near the umbilicus and an incision made. The midline fascia was incised and the Hasson technique was used to introduce a 12 mm port under direct vision. It was secured with a figure of eight Vicryl suture placed in the usual fashion. Pneumoperitoneum was then created with CO2 and tolerated well without any adverse changes in the patient's vital signs. Additional trocars were introduced under direct vision with an 11 mm trocar in the epigastrium and two 5 mm trocars in the right upper quadrant. All skin incisions were infiltrated with a local anesthetic agent before making the incision and placing the trocars.   The gallbladder was identified, the fundus grasped and retracted cephalad. Adhesions were lysed bluntly and with the electrocautery where indicated, taking care not to injure any adjacent organs or viscus. The infundibulum was grasped and retracted laterally, exposing the peritoneum overlying the triangle of Calot. This was then divided and exposed in a blunt fashion. The cystic duct was clearly identified and bluntly dissected circumferentially. The junctions of the gallbladder, cystic duct and common bile duct were clearly identified prior to the division of any linear structure.   An incision was made in the cystic duct and the cholangiogram catheter introduced. The catheter was secured using an endoclip. The study showed no stones and good visualization of the distal and proximal biliary tree. The catheter was then removed.   The cystic duct was then  ligated with  surgical clips  on the patient side and  clipped on  the gallbladder side and divided. The cystic artery was identified, dissected free, ligated with clips and divided as well. Posterior cystic artery clipped and divided.  The gallbladder was dissected from the liver bed in retrograde fashion with the electrocautery. The gallbladder was removed with Endocatch bag. The liver bed was irrigated and inspected. Hemostasis was achieved with the electrocautery. Copious irrigation was utilized and was repeatedly aspirated until clear all particulate matter. Hemostasis was achieved with no signs  Of bleeding or bile leakage.  Pneumoperitoneum was completely reduced after viewing removal of the trocars under direct vision. The wound was thoroughly irrigated and the fascia was then closed with a figure of eight suture; the skin was then closed with 4 O monocryl  and a sterile Dermabond  was applied.  Instrument, sponge, and needle counts were correct at closure and at the conclusion of the case.   Findings:   Cholelithiasis  Estimated Blood Loss: less than 50 mL         Drains: none         Total IV Fluids: 1100 mL         Specimens: Gallbladder           Complications: None; patient tolerated the procedure well.         Disposition: PACU - hemodynamically stable.         Condition: stable

## 2012-07-16 NOTE — Transfer of Care (Signed)
Immediate Anesthesia Transfer of Care Note  Patient: Karen [name]  Procedure(s) Performed: Procedure(s): LAPAROSCOPIC CHOLECYSTECTOMY WITH INTRAOPERATIVE CHOLANGIOGRAM (N/A)  Patient Location: PACU  Anesthesia Type:General  Level of Consciousness: awake, alert  and oriented  Airway & Oxygen Therapy: Patient Spontanous Breathing and Patient connected to nasal cannula oxygen  Post-op Assessment: Report given to PACU RN, Post -op Vital signs reviewed and stable and Patient moving all extremities X 4  Post vital signs: Reviewed and stable  Complications: No apparent anesthesia complications

## 2012-07-16 NOTE — Interval H&P Note (Signed)
History and Physical Interval Note:  07/16/2012 2:33 PM  Karen [name]  has presented today for surgery, with the diagnosis of gallstones  The various methods of treatment have been discussed with the patient and family. After consideration of risks, benefits and other options for treatment, the patient has consented to  Procedure(s): LAPAROSCOPIC CHOLECYSTECTOMY WITH INTRAOPERATIVE CHOLANGIOGRAM (N/A) as a surgical intervention .  The patient's history has been reviewed, patient examined, no change in status, stable for surgery.  I have reviewed the patient's chart and labs.  Questions were answered to the patient's satisfaction.     Collins Dimaria A.

## 2012-07-16 NOTE — Anesthesia Postprocedure Evaluation (Signed)
  Anesthesia Post-op Note  Patient: Karen [name]  Procedure(s) Performed: Procedure(s): LAPAROSCOPIC CHOLECYSTECTOMY WITH INTRAOPERATIVE CHOLANGIOGRAM (N/A)  Patient Location: PACU  Anesthesia Type:General  Level of Consciousness: awake and alert   Airway and Oxygen Therapy: Patient Spontanous Breathing and Patient connected to nasal cannula oxygen  Post-op Pain: none  Post-op Assessment: Post-op Vital signs reviewed, Patient's Cardiovascular Status Stable, Respiratory Function Stable, Patent Airway and No signs of Nausea or vomiting  Post-op Vital Signs: Reviewed and stable  Complications: No apparent anesthesia complications

## 2012-07-16 NOTE — Progress Notes (Signed)
Day of Surgery  Subjective: Admitted with GS pancreatitis.  Feels better.  Objective: Vital signs in last 24 hours: Temp:  [97.9 F (36.6 C)-98.6 F (37 C)] 98.1 F (36.7 C) (06/12 0555) Pulse Rate:  [56-76] 63 (06/12 0555) Resp:  [18-20] 20 (06/12 0555) BP: (99-126)/(49-66) 99/49 mmHg (06/12 0555) SpO2:  [97 %-100 %] 100 % (06/12 0555) Weight:  [170 lb 12.8 oz (77.474 kg)] 170 lb 12.8 oz (77.474 kg) (06/11 1046) Last BM Date: 07/14/12  Intake/Output from previous day: 06/11 0701 - 06/12 0700 In: 1901 [I.V.:1901] Out: 1250 [Urine:1250] Intake/Output this shift:    GI: soft, non-tender; bowel sounds normal; no masses,  no organomegaly  Lab Results:   Recent Labs  07/15/12 1132 07/16/12 0540  WBC 6.2 3.6*  HGB 10.6* 10.3*  HCT 33.1* 32.1*  PLT 211 205   BMET  Recent Labs  07/15/12 1132 07/16/12 0540  NA 140 140  K 4.0 3.9  CL 106 106  CO2 26 28  GLUCOSE 120* 107*  BUN 10 6  CREATININE 0.98 1.05  CALCIUM 9.3 9.1   PT/INR No results found for this basename: LABPROT, INR,  in the last 72 hours ABG No results found for this basename: PHART, PCO2, PO2, HCO3,  in the last 72 hours  Studies/Results: US Abdomen Complete  07/15/2012   *RADIOLOGY REPORT*  Clinical Data:  Abdominal pain with known gallstones.  Question cholecystitis.  COMPLETE ABDOMINAL ULTRASOUND  Comparison:  None.  Findings:  Gallbladder:  There are several shadowing echogenic stones measuring up to 1.1 cm, as well as nonshadowing echogenic sludge. Gallbladder wall measures 6 mm.  Negative sonographic Murphy's sign.  Common bile duct:  Measures 6-9 mm, prominent for age.  Liver:  No focal lesion identified.  Within normal limits in parenchymal echogenicity.  IVC:  Appears normal.  Pancreas:  No focal abnormality seen.  Spleen:  Measures 6.8 cm, negative.  Right Kidney:  Measures 10.4 cm.  Parenchymal echogenicity is normal.  No hydronephrosis.  No focal lesion.  Left Kidney:  Measures 11.2 cm.   Parenchymal echogenicity is normal.  A 1.2 x 1.1 x 1.6 cm anechoic lesion with increased through transmission is seen in the lower pole, consistent with a cyst.  Minimal pelviectasis.  Abdominal aorta:  No aneurysm identified.  IMPRESSION:  1.  Cholelithiasis and sludge with wall thickening.  Negative sonographic Murphy's sign.  Findings can be seen with chronic cholecystitis. 2.  Mildly prominent common bile duct.   Original Report Authenticated By: Leanna Battles, M.D.    Anti-infectives: Anti-infectives   Start     Dose/Rate Route Frequency Ordered Stop   07/15/12 1200  ciprofloxacin (CIPRO) IVPB 400 mg     400 mg 200 mL/hr over 60 Minutes Intravenous Every 12 hours 07/15/12 0857     07/15/12 1045  ceFAZolin (ANCEF) 3 g in dextrose 5 % 50 mL IVPB     3 g 160 mL/hr over 30 Minutes Intravenous On call to O.R. 07/15/12 1045 07/15/12 1309      Assessment/Plan: s/p Procedure(s): LAPAROSCOPIC CHOLECYSTECTOMY WITH INTRAOPERATIVE CHOLANGIOGRAM (N/A) Proceed with lap chole today.  LOS: 1 day    Karen [name] A. 07/16/2012

## 2012-07-17 MED ORDER — HYDROCODONE-ACETAMINOPHEN 5-325 MG PO TABS: 1.0000 | ORAL_TABLET | ORAL | Status: DC | PRN

## 2012-07-17 MED ORDER — TRAMADOL HCL 50 MG PO TABS: 50.0000 mg | ORAL_TABLET | Freq: Four times a day (QID) | ORAL | Status: DC | PRN

## 2012-07-17 NOTE — Progress Notes (Signed)
1 Day Post-Op  Subjective: FEELS OK SORE  Objective: Vital signs in last 24 hours: Temp:  [97.4 F (36.3 C)-99.7 F (37.6 C)] 99.7 F (37.6 C) (06/13 0515) Pulse Rate:  [54-68] 66 (06/13 0515) Resp:  [10-18] 18 (06/13 0515) BP: (106-143)/(48-75) 122/63 mmHg (06/13 0515) SpO2:  [99 %-100 %] 100 % (06/13 0515) Last BM Date: 07/14/12  Intake/Output from previous day: 06/12 0701 - 06/13 0700 In: 2271.7 [I.V.:2271.7] Out: 1425 [Urine:1400; Blood:25] Intake/Output this shift:    Incision/Wound:intact soft CDI  Lab Results:   Recent Labs  07/15/12 1132 07/16/12 0540  WBC 6.2 3.6*  HGB 10.6* 10.3*  HCT 33.1* 32.1*  PLT 211 205   BMET  Recent Labs  07/15/12 1132 07/16/12 0540  NA 140 140  K 4.0 3.9  CL 106 106  CO2 26 28  GLUCOSE 120* 107*  BUN 10 6  CREATININE 0.98 1.05  CALCIUM 9.3 9.1   PT/INR No results found for this basename: LABPROT, INR,  in the last 72 hours ABG No results found for this basename: PHART, PCO2, PO2, HCO3,  in the last 72 hours  Studies/Results: Dg Cholangiogram Operative  07/16/2012   *RADIOLOGY REPORT*  Clinical Data: Cholelithiasis  INTRAOPERATIVE CHOLANGIOGRAM  Technique:  Multiple fluoroscopic spot radiographs were obtained during intraoperative cholangiogram and are submitted for interpretation post-operatively.  Comparison: None.  Findings: No persistent filling defects in the common duct. Intrahepatic ducts are incompletely visualized, appearing decompressed centrally. Contrast passes into the duodenum.  IMPRESSION  Negative for retained common duct stone.   Original Report Authenticated By: D. Andria Rhein, MD    Anti-infectives: Anti-infectives   Start     Dose/Rate Route Frequency Ordered Stop   07/16/12 1449  ceFAZolin (ANCEF) 1-5 GM-% IVPB    Comments:  FLORES, BOB: cabinet override      07/16/12 1449 07/17/12 0259   07/16/12 1448  ceFAZolin (ANCEF) 2-3 GM-% IVPB SOLR    Comments:  FLORES, BOB: cabinet override   07/16/12 1448 07/16/12 1500   07/15/12 1200  ciprofloxacin (CIPRO) IVPB 400 mg     400 mg 200 mL/hr over 60 Minutes Intravenous Every 12 hours 07/15/12 0857     07/15/12 1045  ceFAZolin (ANCEF) 3 g in dextrose 5 % 50 mL IVPB     3 g 160 mL/hr over 30 Minutes Intravenous On call to O.R. 07/15/12 1045 07/15/12 1309      Assessment/Plan: s/p Procedure(s): LAPAROSCOPIC CHOLECYSTECTOMY WITH INTRAOPERATIVE CHOLANGIOGRAM (N/A) Discharge  LOS: 2 days    Karen [name] A. 07/17/2012

## 2012-07-17 NOTE — Discharge Summary (Signed)
Physician Discharge Summary  Patient ID: Karen [name] MRN: [id number] DOB/AGE: Feb 13, 1960 52 y.o.  Admit date: 07/15/2012 Discharge date: 07/17/2012  Admission Diagnoses:GALLSTONE PANCREATITIS  Discharge Diagnoses: SAME Active Problems:   * No active hospital problems. *   Discharged Condition: good  Hospital Course: Unremarkable.  Admitted prior to surgery secondary to mild bout of gallstone pancreatitis.  This resolved quickly and she underwent laparoscopic cholecystectomy and cholangiogram.  She did well and was discharged home on POD 1.  Consults: None  Significant Diagnostic Studies: labs:  CBC    Component Value Date/Time   WBC 3.6* 07/16/2012 0540   WBC 4.3 06/03/2012 0918   WBC 3.0* 05/20/2007 1521   RBC 3.57* 07/16/2012 0540   RBC 3.25* 05/20/2007 1521   HGB 10.3* 07/16/2012 0540   HGB 10.5* 06/03/2012 0918   HGB 10.1* 05/20/2007 1521   HCT 32.1* 07/16/2012 0540   HCT 33.4* 06/03/2012 0918   HCT 30.0* 05/20/2007 1521   PLT 205 07/16/2012 0540   PLT 267 06/03/2012 0918   PLT 231 05/20/2007 1521   MCV 89.9 07/16/2012 0540   MCV 93 06/03/2012 0918   MCV 92.3 05/20/2007 1521   MCH 28.9 07/16/2012 0540   MCH 29.2 06/03/2012 0918   MCH 31.1 05/20/2007 1521   MCHC 32.1 07/16/2012 0540   MCHC 31.4* 06/03/2012 0918   MCHC 33.7 05/20/2007 1521   RDW 13.1 07/16/2012 0540   RDW 12.9 06/03/2012 0918   RDW 13.0 05/20/2007 1521   LYMPHSABS 1.9 06/03/2012 0918   LYMPHSABS 1.3 12/11/2011 0926   LYMPHSABS 1.1 05/20/2007 1521   MONOABS 0.3 12/11/2011 0926   MONOABS 0.2 05/20/2007 1521   EOSABS 0.1 06/03/2012 0918   EOSABS 0.0 12/11/2011 0926   EOSABS 0.0 05/20/2007 1521   BASOSABS 0.0 06/03/2012 0918   BASOSABS 0.0 12/11/2011 0926   BASOSABS 0.0 05/20/2007 1521     Treatments: surgery: LAP CHOLE IOC  Discharge Exam: Blood pressure 122/63, pulse 66, temperature 99.7 F (37.6 C), temperature source Oral, resp. rate 18, height 5\' 10"  (1.778 m), weight 170 lb 12.8 oz (77.474 kg), SpO2  100.00%. Incision/Wound:SOFT ABDOMEN CDI  Disposition: 01-Home or Self Care   Future Appointments Provider Department Dept Phone   08/03/2012 9:00 AM Maisie Fus A. Teyanna Thielman, MD [facility], [place] [phone]   09/02/2012 9:00 AM Rachael Fee [facility] [phone]   09/02/2012 9:30 AM Josph Macho, MD [facility] [phone]   09/02/2012 9:45 AM [facility] Nurse [facility] [phone]       Medication List    ASK your doctor about these medications       bisacodyl 5 MG EC tablet  Commonly known as:  DULCOLAX  Take 5 mg by mouth daily as needed for constipation.     buPROPion 150 MG 12 hr tablet  Commonly known as:  WELLBUTRIN SR  Take 150 mg by mouth every morning.     escitalopram 20 MG tablet  Commonly known as:  LEXAPRO  Take 20 mg by mouth daily.     haloperidol 1 MG tablet  Commonly known as:  HALDOL  Take 1 mg by mouth every morning.     ibuprofen 800 MG tablet  Commonly known as:  ADVIL,MOTRIN  Take 800 mg by mouth every 8 (eight) hours as needed. For pain     levothyroxine 150 MCG tablet  Commonly known as:  SYNTHROID, LEVOTHROID  Take 150 mcg by mouth daily before breakfast.     omeprazole 20 MG capsule  Commonly known as:  PRILOSEC  Take 1 capsule (20 mg total) by mouth daily.     zolpidem 10 MG tablet  Commonly known as:  AMBIEN  Take 10 mg by mouth at bedtime as needed. For sleep         Signed: Aeisha Minarik A. 07/17/2012, 8:43 AM

## 2012-07-21 ENCOUNTER — Encounter (HOSPITAL_COMMUNITY): Payer: Self-pay | Admitting: Surgery

## 2012-08-03 ENCOUNTER — Encounter (INDEPENDENT_AMBULATORY_CARE_PROVIDER_SITE_OTHER): Payer: Self-pay | Admitting: Surgery

## 2012-08-03 ENCOUNTER — Ambulatory Visit (INDEPENDENT_AMBULATORY_CARE_PROVIDER_SITE_OTHER): Payer: BC Managed Care – PPO | Admitting: Surgery

## 2012-08-03 VITALS — BP 120/64 | HR 88 | Resp 14 | Ht 70.0 in | Wt 183.8 lb

## 2012-08-03 DIAGNOSIS — Z9889 Other specified postprocedural states: Secondary | ICD-10-CM

## 2012-08-03 NOTE — Progress Notes (Signed)
she is here for a postop visit following laparoscopic cholecystectomy.  Diet is being tolerated, bowels are moving.  No problems with incisions.  PE:  ABD:  Soft, incisions clean/dry/intact and solid.  Assessment:  Doing well postop.  Plan:  Lowfat diet recommended.  Activities as tolerated.  Return visit prn. 

## 2012-08-03 NOTE — Patient Instructions (Signed)
Return to full activity.  No follow up necessary.

## 2012-09-02 ENCOUNTER — Other Ambulatory Visit: Payer: BC Managed Care – PPO | Admitting: Lab

## 2012-09-02 ENCOUNTER — Ambulatory Visit: Payer: BC Managed Care – PPO | Admitting: Hematology & Oncology

## 2012-09-02 ENCOUNTER — Ambulatory Visit: Payer: BC Managed Care – PPO

## 2012-09-11 ENCOUNTER — Telehealth: Payer: Self-pay | Admitting: Hematology & Oncology

## 2012-09-11 NOTE — Telephone Encounter (Signed)
Pt made 8-11 appointment °

## 2012-09-14 ENCOUNTER — Other Ambulatory Visit (HOSPITAL_BASED_OUTPATIENT_CLINIC_OR_DEPARTMENT_OTHER): Payer: BC Managed Care – PPO | Admitting: Lab

## 2012-09-14 ENCOUNTER — Ambulatory Visit (HOSPITAL_BASED_OUTPATIENT_CLINIC_OR_DEPARTMENT_OTHER): Payer: BC Managed Care – PPO | Admitting: Hematology & Oncology

## 2012-09-14 VITALS — BP 116/65 | HR 62 | Temp 98.1°F | Resp 16 | Ht 69.0 in | Wt 179.0 lb

## 2012-09-14 DIAGNOSIS — D509 Iron deficiency anemia, unspecified: Secondary | ICD-10-CM

## 2012-09-14 DIAGNOSIS — N289 Disorder of kidney and ureter, unspecified: Secondary | ICD-10-CM

## 2012-09-14 DIAGNOSIS — D649 Anemia, unspecified: Secondary | ICD-10-CM

## 2012-09-14 LAB — CBC WITH DIFFERENTIAL (CANCER CENTER ONLY)
BASO%: 0.3 % (ref 0.0–2.0)
EOS%: 1 % (ref 0.0–7.0)
HGB: 11.3 g/dL — ABNORMAL LOW (ref 11.6–15.9)
LYMPH#: 1.5 10*3/uL (ref 0.9–3.3)
MCHC: 32 g/dL (ref 32.0–36.0)
MONO#: 0.3 10*3/uL (ref 0.1–0.9)
NEUT#: 2.1 10*3/uL (ref 1.5–6.5)
RDW: 12.9 % (ref 11.1–15.7)
WBC: 4 10*3/uL (ref 3.9–10.0)

## 2012-09-14 LAB — IRON AND TIBC CHCC
Iron: 66 ug/dL (ref 41–142)
TIBC: 240 ug/dL (ref 236–444)
UIBC: 174 ug/dL (ref 120–384)

## 2012-09-14 NOTE — Progress Notes (Signed)
This office note has been dictated.

## 2012-09-15 NOTE — Progress Notes (Signed)
CC:   Karen Gens A. [name], MD  DIAGNOSES: 1. Anemia of renal insufficiency. 2. Intermittent iron-deficiency anemia.  CURRENT THERAPY: 1. Aranesp 300 mcg subcutaneous as needed for hemoglobin less than 10. 2. IV iron as indicated.  INTERIM HISTORY:  Karen [name] comes in for followup.  She has been quite busy since we last saw her.  She had pancreatitis.  This was secondary to gallstones.  She subsequently had her gallbladder taken out.  This was taken out laparoscopically on 06/12.  The pathology report did not show any malignancy.  She had multiple gallstones. When we last saw her in April, her iron studies showed a ferritin of 338 with an iron saturation of 20%.  REVIEW OF SYSTEMS:  She has had no bleeding.  There has been no nausea or vomiting.  She has had no leg swelling.  There has been no rashes.  MEDICATIONS:  She has had no change in her medications.  She does continue on Synthroid.  PHYSICAL EXAMINATION:  General:  This is a well-developed, well- nourished [name] in no obvious distress.  Vital signs: Temperature of 98.1, pulse 62, respiratory rate 16, blood pressure 116/65.  Weight is 179.  Head and neck:  Normocephalic, atraumatic skull.  There are no ocular or oral lesions.  There are no palpable cervical or supraclavicular lymph nodes.  Lungs:  Clear bilaterally. Cardiac:  Regular rate and rhythm with normal S1, S2.  There are no murmurs, rubs or bruits.  Abdomen:  Soft.  She has well-healed laparoscopy scars.  There is no fluid wave.  There is no palpable abdominal mass.  There is no palpable hepatosplenomegaly.  Extremities: No clubbing, cyanosis or edema.  Skin:  Exam showed no rashes, ecchymosis, or petechia.  LABORATORY STUDIES:  White cell count is 4, hemoglobin 11.3, hematocrit 35.3, platelet count 238.  MCV is 92.  IMPRESSION:  Karen [name] is a 52 year old [name] with multifactorial anemia.  Again, she is doing quite well from  my point of view.  She does not need any Aranesp or iron at this point in time.  We will plan to get her back in another 3 months.  It is possible that we may need to give her iron at that point.   ______________________________ Josph Macho, M.D. PRE/MEDQ  D:  09/14/2012  T:  09/15/2012  Job:  [id number]

## 2012-09-16 ENCOUNTER — Telehealth: Payer: Self-pay | Admitting: Oncology

## 2012-09-16 NOTE — Telephone Encounter (Addendum)
Message copied by Lacie Draft on Wed Sep 16, 2012 10:06 AM ------      Message from: Arlan Organ R      Created: Mon Sep 14, 2012  6:46 PM       Call - iron is ok!!  Cindee Lame ------Left message on voicemail.

## 2012-11-12 ENCOUNTER — Other Ambulatory Visit: Payer: Self-pay

## 2012-11-12 DIAGNOSIS — Z1231 Encounter for screening mammogram for malignant neoplasm of breast: Secondary | ICD-10-CM

## 2012-11-25 ENCOUNTER — Encounter: Payer: Self-pay | Admitting: Family Medicine

## 2012-11-25 ENCOUNTER — Ambulatory Visit (INDEPENDENT_AMBULATORY_CARE_PROVIDER_SITE_OTHER): Payer: BC Managed Care – PPO | Admitting: Family Medicine

## 2012-11-25 VITALS — BP 130/84 | Temp 97.8°F | Wt 183.0 lb

## 2012-11-25 DIAGNOSIS — D509 Iron deficiency anemia, unspecified: Secondary | ICD-10-CM

## 2012-11-25 DIAGNOSIS — R634 Abnormal weight loss: Secondary | ICD-10-CM | POA: Insufficient documentation

## 2012-11-25 DIAGNOSIS — Z23 Encounter for immunization: Secondary | ICD-10-CM

## 2012-11-25 DIAGNOSIS — R079 Chest pain, unspecified: Secondary | ICD-10-CM | POA: Insufficient documentation

## 2012-11-25 DIAGNOSIS — D72819 Decreased white blood cell count, unspecified: Secondary | ICD-10-CM

## 2012-11-25 DIAGNOSIS — D649 Anemia, unspecified: Secondary | ICD-10-CM

## 2012-11-25 LAB — CBC WITH DIFFERENTIAL/PLATELET
Basophils Absolute: 0 10*3/uL (ref 0.0–0.1)
Eosinophils Absolute: 0 10*3/uL (ref 0.0–0.7)
MCHC: 32.5 g/dL (ref 30.0–36.0)
MCV: 92.1 fl (ref 78.0–100.0)
Monocytes Absolute: 0.3 10*3/uL (ref 0.1–1.0)
Neutrophils Relative %: 45.6 % (ref 43.0–77.0)
Platelets: 195 10*3/uL (ref 150.0–400.0)
RDW: 13.4 % (ref 11.5–14.6)

## 2012-11-25 LAB — BASIC METABOLIC PANEL
CO2: 29 mEq/L (ref 19–32)
Chloride: 107 mEq/L (ref 96–112)
Potassium: 3.5 mEq/L (ref 3.5–5.1)
Sodium: 144 mEq/L (ref 135–145)

## 2012-11-25 LAB — POCT URINALYSIS DIPSTICK
Bilirubin, UA: NEGATIVE
Blood, UA: NEGATIVE
Nitrite, UA: NEGATIVE
Protein, UA: NEGATIVE
pH, UA: 7

## 2012-11-25 LAB — HEPATIC FUNCTION PANEL
ALT: 14 U/L (ref 0–35)
AST: 21 U/L (ref 0–37)
Albumin: 3.8 g/dL (ref 3.5–5.2)
Alkaline Phosphatase: 91 U/L (ref 39–117)
Total Protein: 7 g/dL (ref 6.0–8.3)

## 2012-11-25 LAB — LIPID PANEL
Cholesterol: 170 mg/dL (ref 0–200)
Total CHOL/HDL Ratio: 3
Triglycerides: 82 mg/dL (ref 0.0–149.0)

## 2012-11-25 LAB — LIPASE: Lipase: 29 U/L (ref 11.0–59.0)

## 2012-11-25 LAB — AMYLASE: Amylase: 56 U/L (ref 27–131)

## 2012-11-25 LAB — TSH: TSH: 0.84 u[IU]/mL (ref 0.35–5.50)

## 2012-11-25 MED ORDER — FLUOCINONIDE-E 0.05 % EX CREA: TOPICAL_CREAM | Freq: Two times a day (BID) | CUTANEOUS | Status: DC

## 2012-11-25 NOTE — Patient Instructions (Signed)
Apply small amounts of the Lidex twice daily to the rash  For the chest wall pain I would recommend Motrin 400 mg twice daily with food  Labs today  A call you tomorrow about the report

## 2012-11-25 NOTE — Progress Notes (Signed)
  Subjective:    Patient ID: Karen [name], female    DOB: 1960/11/08, 52 y.o.   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 It is pleuritic. It started last Friday. She's never had trouble with a skin rash like this before.  He says the soreness in her left ribs for one day no history of trauma cough etc.  She had her gallbladder removed because of chronic cholecystitis. She had postop pancreatitis and recovered since that time however she says she's got 36 pound since June. She's not been trying to diet. She's had no fever chills night sweats diarrhea change in bowel habits etc.  She's followed in the hematology clinic because of anemia unknown etiology question iron. Last iron levels were all normal    Review of Systems    general cardiopulmonary GI review of systems otherwise negative Objective:   Physical Exam  Constitutional: She appears well-developed and well-nourished.  HENT:  Head: Normocephalic and atraumatic.  Right Ear: External ear normal.  Left Ear: External ear normal.  Nose: Nose normal.  Mouth/Throat: Oropharynx is clear and moist.  Eyes: EOM are normal. Pupils are equal, round, and reactive to light.  Neck: Normal range of motion. Neck supple. No thyromegaly present.  Cardiovascular: Normal rate, regular rhythm, normal heart sounds and intact distal pulses.  Exam reveals no gallop and no friction rub.   No murmur heard. Pulmonary/Chest: Effort normal and breath sounds normal.  Abdominal: Soft. Bowel sounds are normal. She exhibits no distension and no mass. There is no tenderness. There is no rebound.  Genitourinary: Vagina normal and uterus normal. Guaiac negative stool. No vaginal discharge found.  Musculoskeletal: Normal range of motion.  Lymphadenopathy:    She has no cervical adenopathy.   Neurological: She is alert. She has normal reflexes. No cranial nerve deficit. She exhibits normal muscle tone. Coordination normal.  Skin: Skin is warm and dry.  Psychiatric: She has a normal mood and affect. Her behavior is normal. Judgment and thought content normal.          Assessment & Plan:  Well-developed well-nourished female no acute distress examination of skin shows hives in the left side of her jaw x410 mm in diameter  Cardiopulmonary exam normal abdominal exam normal  Weight loss etiology unknown check labs

## 2012-11-26 ENCOUNTER — Encounter: Payer: Self-pay | Admitting: *Deleted

## 2012-11-26 NOTE — Progress Notes (Unsigned)
Pt called this am to report she had a CBC run at Dr. Nelida Meuse office yesterday.  Her Hgb was 9 according to pt.  Wanted to come in for a Aranesp injection.  Reviewed CBC from yesterday and Hgb was 9.7.  Left message on pt's home answering machine that it should be ok for her to wait till her appt with Dr. Myna Hidalgo on 12/10/12 to get Aranesp but if she felt she needed it before that date to call us Monday when Dr. Myna Hidalgo is back in the office and we will talk to him about her coming in earlier for Aranesp.

## 2012-11-30 ENCOUNTER — Ambulatory Visit: Payer: BC Managed Care – PPO

## 2012-12-10 ENCOUNTER — Ambulatory Visit (HOSPITAL_BASED_OUTPATIENT_CLINIC_OR_DEPARTMENT_OTHER): Payer: BC Managed Care – PPO | Admitting: Hematology & Oncology

## 2012-12-10 ENCOUNTER — Ambulatory Visit (HOSPITAL_BASED_OUTPATIENT_CLINIC_OR_DEPARTMENT_OTHER): Payer: BC Managed Care – PPO

## 2012-12-10 ENCOUNTER — Other Ambulatory Visit (HOSPITAL_BASED_OUTPATIENT_CLINIC_OR_DEPARTMENT_OTHER): Payer: BC Managed Care – PPO | Admitting: Lab

## 2012-12-10 VITALS — BP 135/73 | HR 56 | Temp 98.0°F | Resp 14 | Ht 69.0 in | Wt 179.0 lb

## 2012-12-10 DIAGNOSIS — D509 Iron deficiency anemia, unspecified: Secondary | ICD-10-CM

## 2012-12-10 DIAGNOSIS — E039 Hypothyroidism, unspecified: Secondary | ICD-10-CM

## 2012-12-10 DIAGNOSIS — D649 Anemia, unspecified: Secondary | ICD-10-CM

## 2012-12-10 DIAGNOSIS — N189 Chronic kidney disease, unspecified: Secondary | ICD-10-CM

## 2012-12-10 DIAGNOSIS — N289 Disorder of kidney and ureter, unspecified: Secondary | ICD-10-CM

## 2012-12-10 DIAGNOSIS — D631 Anemia in chronic kidney disease: Secondary | ICD-10-CM

## 2012-12-10 LAB — CBC WITH DIFFERENTIAL (CANCER CENTER ONLY)
BASO#: 0 10*3/uL (ref 0.0–0.2)
BASO%: 0.2 % (ref 0.0–2.0)
Eosinophils Absolute: 0.1 10*3/uL (ref 0.0–0.5)
HCT: 30.9 % — ABNORMAL LOW (ref 34.8–46.6)
HGB: 9.7 g/dL — ABNORMAL LOW (ref 11.6–15.9)
LYMPH#: 1.5 10*3/uL (ref 0.9–3.3)
MONO#: 0.4 10*3/uL (ref 0.1–0.9)
NEUT#: 2.2 10*3/uL (ref 1.5–6.5)
NEUT%: 53.1 % (ref 39.6–80.0)
WBC: 4.1 10*3/uL (ref 3.9–10.0)

## 2012-12-10 LAB — RETICULOCYTES (CHCC)
RBC.: 3.43 MIL/uL — ABNORMAL LOW (ref 3.87–5.11)
Retic Ct Pct: 1.4 % (ref 0.4–2.3)

## 2012-12-10 LAB — IRON AND TIBC CHCC
%SAT: 27 % (ref 21–57)
TIBC: 249 ug/dL (ref 236–444)
UIBC: 181 ug/dL (ref 120–384)

## 2012-12-10 MED ORDER — DARBEPOETIN ALFA-POLYSORBATE 300 MCG/0.6ML IJ SOLN
INTRAMUSCULAR | Status: AC
  Filled 2012-12-10: qty 0.6

## 2012-12-10 MED ORDER — DARBEPOETIN ALFA-POLYSORBATE 300 MCG/0.6ML IJ SOLN
300.0000 ug | Freq: Once | INTRAMUSCULAR | Status: AC
  Administered 2012-12-10: 300 ug via SUBCUTANEOUS

## 2012-12-10 NOTE — Addendum Note (Signed)
Addended by: Arlan Organ R on: 12/10/2012 10:39 AM   Modules accepted: Orders

## 2012-12-10 NOTE — Progress Notes (Signed)
This office note has been dictated.

## 2012-12-10 NOTE — Patient Instructions (Signed)
Epoetin Alfa injection What is this medicine? EPOETIN ALFA (e POE e tin AL fa) helps your body make more red blood cells. This medicine is used to treat anemia caused by chronic kidney failure, cancer chemotherapy, or HIV-therapy. It may also be used before surgery if you have anemia. This medicine may be used for other purposes; ask your health care provider or pharmacist if you have questions. COMMON BRAND NAME(S): Epogen, Procrit What should I tell my health care provider before I take this medicine? They need to know if you have any of these conditions: -blood clotting disorders -cancer patient not on chemotherapy -cystic fibrosis -heart disease, such as angina or heart failure -hemoglobin level of 12 g/dL or greater -high blood pressure -low levels of folate, iron, or vitamin B12 -seizures -an unusual or allergic reaction to erythropoietin, albumin, benzyl alcohol, hamster proteins, other medicines, foods, dyes, or preservatives -pregnant or trying to get pregnant -breast-feeding How should I use this medicine? This medicine is for injection into a vein or under the skin. It is usually given by a health care professional in a hospital or clinic setting. If you get this medicine at home, you will be taught how to prepare and give this medicine. Use exactly as directed. Take your medicine at regular intervals. Do not take your medicine more often than directed. It is important that you put your used needles and syringes in a special sharps container. Do not put them in a trash can. If you do not have a sharps container, call your pharmacist or healthcare provider to get one. Talk to your pediatrician regarding the use of this medicine in children. While this drug may be prescribed for selected conditions, precautions do apply. Overdosage: If you think you have taken too much of this medicine contact a poison control center or emergency room at once. NOTE: This medicine is only for you. Do  not share this medicine with others. What if I miss a dose? If you miss a dose, take it as soon as you can. If it is almost time for your next dose, take only that dose. Do not take double or extra doses. What may interact with this medicine? Do not take this medicine with any of the following medications: -darbepoetin alfa This list may not describe all possible interactions. Give your health care provider a list of all the medicines, herbs, non-prescription drugs, or dietary supplements you use. Also tell them if you smoke, drink alcohol, or use illegal drugs. Some items may interact with your medicine. What should I watch for while using this medicine? Visit your prescriber or health care professional for regular checks on your progress and for the needed blood tests and blood pressure measurements. It is especially important for the doctor to make sure your hemoglobin level is in the desired range, to limit the risk of potential side effects and to give you the best benefit. Keep all appointments for any recommended tests. Check your blood pressure as directed. Ask your doctor what your blood pressure should be and when you should contact him or her. As your body makes more red blood cells, you may need to take iron, folic acid, or vitamin B supplements. Ask your doctor or health care provider which products are right for you. If you have kidney disease continue dietary restrictions, even though this medication can make you feel better. Talk with your doctor or health care professional about the foods you eat and the vitamins that you take. What   side effects may I notice from receiving this medicine? Side effects that you should report to your doctor or health care professional as soon as possible: -allergic reactions like skin rash, itching or hives, swelling of the face, lips, or tongue -breathing problems -changes in vision -chest pain -confusion, trouble speaking or understanding -feeling  faint or lightheaded, falls -high blood pressure -muscle aches or pains -pain, swelling, warmth in the leg -rapid weight gain -severe headaches -sudden numbness or weakness of the face, arm or leg -trouble walking, dizziness, loss of balance or coordination -seizures (convulsions) -swelling of the ankles, feet, hands -unusually weak or tired Side effects that usually do not require medical attention (report to your doctor or health care professional if they continue or are bothersome): -diarrhea -fever, chills (flu-like symptoms) -headaches -nausea, vomiting -redness, stinging, or swelling at site where injected This list may not describe all possible side effects. Call your doctor for medical advice about side effects. You may report side effects to FDA at 1-800-FDA-1088. Where should I keep my medicine? Keep out of the reach of children. Store in a refrigerator between 2 and 8 degrees C (36 and 46 degrees F). Do not freeze or shake. Throw away any unused portion if using a single-dose vial. Multi-dose vials can be kept in the refrigerator for up to 21 days after the initial dose. Throw away unused medicine. NOTE: This sheet is a summary. It may not cover all possible information. If you have questions about this medicine, talk to your doctor, pharmacist, or health care provider.  2014, Elsevier/Gold Standard. (2008-01-05 10:25:44)  

## 2012-12-12 NOTE — Progress Notes (Signed)
CC:   Karen Gens A. [name], MD  DIAGNOSES: 1. Anemia of renal insufficiency. 2. Intermittent iron-deficiency anemia. 3. Hypothyroidism.  CURRENT THERAPY: 1. Aranesp 100 mcg subcu as needed for hemoglobin less than 10. 2. IV iron as indicated.  INTERIM HISTORY:  Karen [name] comes in for followup.  She is doing fairly well.  It has been a while since she had any Aranesp.  She has had Aranesp probably for good 8 months or so.  When we last saw her in August, her iron studies showed a ferritin of 367 with iron saturation 27%.  She does feel a little tired.  She had lab work done a couple of weeks ago by Dr. Tawanna [name].  She was found to be anemic then.  She has had no bleeding.  She has had no nausea or vomiting.  She has had no abdominal pain.  She recovered from her gallbladder surgery in June.  PHYSICAL EXAMINATION:  General:  This is a well-developed, well- nourished [name] in no obvious distress.  Vital Signs: Temperature of 98, pulse 56, respiratory rate 14, blood pressure 135/73, weight is 179 pounds.  Head and Neck:  Normocephalic, atraumatic skull. There are no ocular or oral lesions.  She has no palpable cervical or supraclavicular lymph nodes.  Lungs:  Clear bilaterally.  Cardiac: Regular rate and rhythm with a normal S1 and S2.  There are no murmurs, rubs, or bruits.  Abdomen:  Soft.  She has good bowel sounds.  There is no fluid wave.  She has well-healed laparoscopy scars.  There is no palpable hepatosplenomegaly.  Extremities:  No clubbing, cyanosis, or edema.  Neurologic:  No focal neurological deficits.  LABORATORY STUDIES:  White cell count is 4.1, hemoglobin 9.7 and 31, and platelet count 235.  MCV is 94.  IMPRESSION:  Karen [name] is a very charming 52 year old [name].  We will go ahead and give her Aranesp today.  Again, she has quite not had Aranesp now for over 8 months.  We will see about her iron studies.  I will plan to get her  back after the holidays now.  She usually does well with 29-month intervals.    ______________________________ Karen [name], M.D. PRE/MEDQ  D:  12/08/2012  T:  12/11/2012  Job:  [id number]

## 2012-12-14 ENCOUNTER — Telehealth: Payer: Self-pay | Admitting: *Deleted

## 2012-12-14 NOTE — Telephone Encounter (Addendum)
Message copied by Mirian Capuchin on Mon Dec 14, 2012  9:51 AM ------      Message from: Arlan Organ R      Created: Fri Dec 11, 2012  5:25 PM       Please call her and let her know that her iron is still doing okay. Thanks ------This message left on pt's home answering machine.

## 2012-12-15 ENCOUNTER — Other Ambulatory Visit: Payer: BC Managed Care – PPO

## 2012-12-15 NOTE — Addendum Note (Signed)
Addended by: Bonnye Fava on: 12/15/2012 10:08 AM   Modules accepted: Orders

## 2012-12-22 ENCOUNTER — Other Ambulatory Visit (HOSPITAL_COMMUNITY)
Admission: RE | Admit: 2012-12-22 | Discharge: 2012-12-22 | Disposition: A | Discharge status: Home / Self Care | Payer: BC Managed Care – PPO | Source: Ambulatory Visit | Attending: Family Medicine | Admitting: Family Medicine

## 2012-12-22 ENCOUNTER — Ambulatory Visit (INDEPENDENT_AMBULATORY_CARE_PROVIDER_SITE_OTHER): Payer: BC Managed Care – PPO | Admitting: Family Medicine

## 2012-12-22 ENCOUNTER — Encounter: Payer: Self-pay | Admitting: Family Medicine

## 2012-12-22 VITALS — BP 120/80 | Temp 97.8°F | Ht 71.5 in | Wt 183.0 lb

## 2012-12-22 DIAGNOSIS — D72819 Decreased white blood cell count, unspecified: Secondary | ICD-10-CM

## 2012-12-22 DIAGNOSIS — E039 Hypothyroidism, unspecified: Secondary | ICD-10-CM

## 2012-12-22 DIAGNOSIS — D649 Anemia, unspecified: Secondary | ICD-10-CM

## 2012-12-22 DIAGNOSIS — Z01419 Encounter for gynecological examination (general) (routine) without abnormal findings: Secondary | ICD-10-CM | POA: Insufficient documentation

## 2012-12-22 DIAGNOSIS — F3289 Other specified depressive episodes: Secondary | ICD-10-CM

## 2012-12-22 DIAGNOSIS — F329 Major depressive disorder, single episode, unspecified: Secondary | ICD-10-CM

## 2012-12-22 DIAGNOSIS — D509 Iron deficiency anemia, unspecified: Secondary | ICD-10-CM

## 2012-12-22 MED ORDER — LEVOTHYROXINE SODIUM 150 MCG PO TABS: 150.0000 ug | ORAL_TABLET | Freq: Every day | ORAL | Status: DC

## 2012-12-22 MED ORDER — OMEPRAZOLE 20 MG PO CPDR: 20.0000 mg | DELAYED_RELEASE_CAPSULE | Freq: Every day | ORAL | Status: DC

## 2012-12-22 NOTE — Progress Notes (Signed)
  Subjective:    Patient ID: Karen [name], female    DOB: [birth year], 52 y.o.   MRN: [id number]  HPI livi is a 52 year old female,,, nonsmoker,,,, school bus driver,,,,,,,, who comes in today for annual physical examination  She's seen and followed in psychiatry for depression and is on a combination of Wellbutrin so Lexapro and Haldol and doing well. She's functioning and feels good  6 she has a history of iron deficiency anemia etiology unknown currently followed in hematology by Dr. Bartholome Bill. 6 recent hemoglobin 9.7 however iron studies were normal  She has a history of hypothyroidism is on Synthroid 150 mcg daily  She also has chronic reflux and takes Prilosec 20 mg daily. Her psychiatrist gives her Ambien to take 10 mg each bedtime when necessary for sleep 6  She gets routine eye care, dental care, BSE monthly, and you mammography, colonoscopy a couple years ago normal, vaccinations up-to-date   Review of Systems  Constitutional: Negative.   HENT: Negative.   Eyes: Negative.   Respiratory: Negative.   Cardiovascular: Negative.   Gastrointestinal: Negative.   Endocrine: Negative.   Genitourinary: Negative.   Musculoskeletal: Negative.   Allergic/Immunologic: Negative.   Neurological: Negative.   Hematological: Negative.   Psychiatric/Behavioral: Negative.        Objective:   Physical Exam  Constitutional: She appears well-developed and well-nourished.  HENT:  Head: Normocephalic and atraumatic.  Right Ear: External ear normal.  Left Ear: External ear normal.  Nose: Nose normal.  Mouth/Throat: Oropharynx is clear and moist.  Eyes: EOM are normal. Pupils are equal, round, and reactive to light.  Neck: Normal range of motion. Neck supple. No thyromegaly present.  Cardiovascular: Normal rate, regular rhythm, normal heart sounds and intact distal pulses.  Exam reveals no gallop and no friction rub.   No murmur heard. Pulmonary/Chest: Effort normal and breath sounds normal.   Abdominal: Soft. Bowel sounds are normal. She exhibits no distension and no mass. There is no tenderness. There is no rebound.  Genitourinary: Vagina normal and uterus normal. Guaiac negative stool. No vaginal discharge found.  Bilateral breast exam normal  Musculoskeletal: Normal range of motion.  Lymphadenopathy:    She has no cervical adenopathy.  Neurological: She is alert. She has normal reflexes. No cranial nerve deficit. She exhibits normal muscle tone. Coordination normal.  Skin: Skin is warm and dry.  Psychiatric: She has a normal mood and affect. Her behavior is normal. Judgment and thought content normal.          Assessment & Plan:  Healthy female  History of depression continue current meds followup in psychiatry  Hypothyroidism continue Synthroid check labs  Reflux esophagitis continue Prilosec  Anemia unknown etiology followed by Dr. Bartholome Bill in hematology

## 2012-12-22 NOTE — Patient Instructions (Signed)
Continue current medications  We will call you in a day or 2 to go over your lab work. Return in one year sooner if any problems

## 2012-12-22 NOTE — Progress Notes (Signed)
Pre visit review using our clinic review tool, if applicable. No additional management support is needed unless otherwise documented below in the visit note. 

## 2012-12-29 ENCOUNTER — Ambulatory Visit
Admission: RE | Admit: 2012-12-29 | Discharge: 2012-12-29 | Disposition: A | Discharge status: Home / Self Care | Payer: BC Managed Care – PPO | Source: Ambulatory Visit

## 2012-12-29 DIAGNOSIS — Z1231 Encounter for screening mammogram for malignant neoplasm of breast: Secondary | ICD-10-CM

## 2013-01-14 ENCOUNTER — Other Ambulatory Visit: Payer: Self-pay | Admitting: Family Medicine

## 2013-03-12 ENCOUNTER — Ambulatory Visit (HOSPITAL_BASED_OUTPATIENT_CLINIC_OR_DEPARTMENT_OTHER): Payer: BC Managed Care – PPO

## 2013-03-12 ENCOUNTER — Ambulatory Visit (HOSPITAL_BASED_OUTPATIENT_CLINIC_OR_DEPARTMENT_OTHER): Payer: BC Managed Care – PPO | Admitting: Hematology & Oncology

## 2013-03-12 ENCOUNTER — Encounter: Payer: Self-pay | Admitting: Hematology & Oncology

## 2013-03-12 ENCOUNTER — Other Ambulatory Visit (HOSPITAL_BASED_OUTPATIENT_CLINIC_OR_DEPARTMENT_OTHER): Payer: BC Managed Care – PPO | Admitting: Lab

## 2013-03-12 VITALS — BP 121/66 | HR 53 | Temp 98.0°F | Resp 14 | Ht 68.0 in | Wt 171.0 lb

## 2013-03-12 DIAGNOSIS — D649 Anemia, unspecified: Secondary | ICD-10-CM

## 2013-03-12 DIAGNOSIS — D500 Iron deficiency anemia secondary to blood loss (chronic): Secondary | ICD-10-CM

## 2013-03-12 DIAGNOSIS — D509 Iron deficiency anemia, unspecified: Secondary | ICD-10-CM

## 2013-03-12 DIAGNOSIS — D631 Anemia in chronic kidney disease: Secondary | ICD-10-CM

## 2013-03-12 DIAGNOSIS — N189 Chronic kidney disease, unspecified: Secondary | ICD-10-CM

## 2013-03-12 DIAGNOSIS — N289 Disorder of kidney and ureter, unspecified: Secondary | ICD-10-CM

## 2013-03-12 DIAGNOSIS — E039 Hypothyroidism, unspecified: Secondary | ICD-10-CM

## 2013-03-12 LAB — CBC WITH DIFFERENTIAL (CANCER CENTER ONLY)
BASO#: 0 10*3/uL (ref 0.0–0.2)
BASO%: 0 % (ref 0.0–2.0)
EOS%: 1.3 % (ref 0.0–7.0)
Eosinophils Absolute: 0 10*3/uL (ref 0.0–0.5)
HEMATOCRIT: 30.2 % — AB (ref 34.8–46.6)
HGB: 9.5 g/dL — ABNORMAL LOW (ref 11.6–15.9)
LYMPH#: 1.6 10*3/uL (ref 0.9–3.3)
LYMPH%: 50.5 % — ABNORMAL HIGH (ref 14.0–48.0)
MCH: 28.6 pg (ref 26.0–34.0)
MCHC: 31.5 g/dL — ABNORMAL LOW (ref 32.0–36.0)
MCV: 91 fL (ref 81–101)
MONO#: 0.3 10*3/uL (ref 0.1–0.9)
MONO%: 8.8 % (ref 0.0–13.0)
NEUT%: 39.4 % — AB (ref 39.6–80.0)
NEUTROS ABS: 1.3 10*3/uL — AB (ref 1.5–6.5)
Platelets: 199 10*3/uL (ref 145–400)
RBC: 3.32 10*6/uL — ABNORMAL LOW (ref 3.70–5.32)
RDW: 13.5 % (ref 11.1–15.7)
WBC: 3.2 10*3/uL — AB (ref 3.9–10.0)

## 2013-03-12 LAB — RETICULOCYTES (CHCC)
ABS RETIC: 37.5 10*3/uL (ref 19.0–186.0)
RBC.: 3.41 MIL/uL — AB (ref 3.87–5.11)
RETIC CT PCT: 1.1 % (ref 0.4–2.3)

## 2013-03-12 MED ORDER — DARBEPOETIN ALFA-POLYSORBATE 300 MCG/0.6ML IJ SOLN
300.0000 ug | Freq: Once | INTRAMUSCULAR | Status: AC
  Administered 2013-03-12: 200 ug via SUBCUTANEOUS

## 2013-03-12 MED ORDER — DARBEPOETIN ALFA-POLYSORBATE 200 MCG/0.4ML IJ SOLN
INTRAMUSCULAR | Status: AC
  Filled 2013-03-12: qty 0.4

## 2013-03-12 NOTE — Progress Notes (Signed)
This office note has been dictated.

## 2013-03-13 NOTE — Progress Notes (Signed)
DIAGNOSES: 1. Anemia of renal insufficiency. 2. Intermittent iron-deficiency anemia. 3. Hypothyroidism.  CURRENT THERAPY: 1. Aranesp 300 mg subcu as needed for hemoglobin less than 10. 2. IV iron as indicated.  INTERIM HISTORY:  Karen [name] comes in for a followup.  She is doing fairly well.  She does feel a little bit tired today.  We last saw her about 3 months ago.  We did go ahead and give her Aranesp at that time.  She has not had any iron for a while.  She has had no fever, sweat, or chills.  She has had no nausea or vomiting.  She has had no problems with cough or shortness of breath.  On her last iron studies, her ferritin was 278 with an iron saturation of 27%.  PHYSICAL EXAMINATION:  General:  This is a well developed, well- nourished [name] in no obvious distress.  Vital Signs: Temperature of 98, pulse 53, respiratory rate 14, blood pressure 121/66. Weight is 171 pounds.  Head and Neck:  Shows a normocephalic, atraumatic skull.  There are no ocular or oral lesions.  There are no palpable cervical or supraclavicular lymph nodes.  Lungs:  Clear bilaterally. Cardiac:  Regular rate and rhythm with a normal S1 and S2.  There are no murmurs, rubs, or bruits.  Abdomen:  Soft.  She has good bowel sounds. There is no fluid wave.  There is no palpable abdominal mass.  There is no palpable hepatosplenomegaly.  Back:  No tenderness over the spine, ribs, or hips.  Extremities:  Show no clubbing, cyanosis, or edema. Neurological:  Shows no focal neurological deficits.  LABORATORY STUDIES:  White cell count 3.2, hemoglobin 9.5, hematocrit 30.2, platelet count 199.  MCV is 91.  IMPRESSION:  Karen [name] is a very nice 53 year old [name].  She has multifactorial anemia.  We will go ahead and give her Aranesp today.  I want to get her back in 1 month now.  I just want to see how things look with respect to her anemia and see how well the Aranesp has  helped her.    ______________________________ Volanda Napoleon, M.D. PRE/MEDQ  D:  03/12/2013  T:  03/13/2013  Job:  [id number]

## 2013-03-15 LAB — IRON AND TIBC CHCC
%SAT: 35 % (ref 21–57)
Iron: 79 ug/dL (ref 41–142)
TIBC: 228 ug/dL — ABNORMAL LOW (ref 236–444)
UIBC: 148 ug/dL (ref 120–384)

## 2013-03-15 LAB — FERRITIN CHCC: FERRITIN: 324 ng/mL — AB (ref 9–269)

## 2013-03-17 ENCOUNTER — Telehealth: Payer: Self-pay | Admitting: *Deleted

## 2013-03-17 NOTE — Telephone Encounter (Signed)
Called patient to let her know that her iron levels are good per dr. Marin Olp.  Left message on patients personal cell phone

## 2013-03-17 NOTE — Telephone Encounter (Signed)
Message copied by Rico Ala on Wed Mar 17, 2013  5:22 PM ------      Message from: Volanda Napoleon      Created: Tue Mar 16, 2013  2:57 PM       Call - iron is ok.  pete ------

## 2013-04-13 ENCOUNTER — Other Ambulatory Visit (HOSPITAL_BASED_OUTPATIENT_CLINIC_OR_DEPARTMENT_OTHER): Payer: BC Managed Care – PPO | Admitting: Lab

## 2013-04-13 ENCOUNTER — Ambulatory Visit: Payer: BC Managed Care – PPO

## 2013-04-13 ENCOUNTER — Ambulatory Visit (HOSPITAL_BASED_OUTPATIENT_CLINIC_OR_DEPARTMENT_OTHER): Payer: BC Managed Care – PPO | Admitting: Hematology & Oncology

## 2013-04-13 VITALS — BP 131/80 | HR 69 | Temp 97.6°F | Resp 18 | Wt 174.0 lb

## 2013-04-13 DIAGNOSIS — D509 Iron deficiency anemia, unspecified: Secondary | ICD-10-CM

## 2013-04-13 DIAGNOSIS — D649 Anemia, unspecified: Secondary | ICD-10-CM

## 2013-04-13 DIAGNOSIS — N289 Disorder of kidney and ureter, unspecified: Secondary | ICD-10-CM

## 2013-04-13 LAB — CBC WITH DIFFERENTIAL (CANCER CENTER ONLY)
BASO#: 0 10*3/uL (ref 0.0–0.2)
BASO%: 0.3 % (ref 0.0–2.0)
EOS%: 0.9 % (ref 0.0–7.0)
Eosinophils Absolute: 0 10*3/uL (ref 0.0–0.5)
HCT: 34.8 % (ref 34.8–46.6)
HGB: 10.8 g/dL — ABNORMAL LOW (ref 11.6–15.9)
LYMPH#: 1.8 10*3/uL (ref 0.9–3.3)
LYMPH%: 51.6 % — ABNORMAL HIGH (ref 14.0–48.0)
MCH: 28.8 pg (ref 26.0–34.0)
MCHC: 31 g/dL — ABNORMAL LOW (ref 32.0–36.0)
MCV: 93 fL (ref 81–101)
MONO#: 0.3 10*3/uL (ref 0.1–0.9)
MONO%: 9 % (ref 0.0–13.0)
NEUT#: 1.3 10*3/uL — ABNORMAL LOW (ref 1.5–6.5)
NEUT%: 38.2 % — ABNORMAL LOW (ref 39.6–80.0)
Platelets: 203 10*3/uL (ref 145–400)
RBC: 3.75 10*6/uL (ref 3.70–5.32)
RDW: 13.4 % (ref 11.1–15.7)
WBC: 3.4 10*3/uL — ABNORMAL LOW (ref 3.9–10.0)

## 2013-04-13 LAB — CHCC SATELLITE - SMEAR

## 2013-04-13 LAB — IRON AND TIBC CHCC
%SAT: 43 % (ref 21–57)
Iron: 102 ug/dL (ref 41–142)
TIBC: 236 ug/dL (ref 236–444)
UIBC: 134 ug/dL (ref 120–384)

## 2013-04-13 LAB — FERRITIN CHCC: Ferritin: 235 ng/ml (ref 9–269)

## 2013-04-13 NOTE — Progress Notes (Signed)
  DIAGNOSIS:  Anemia of renal insufficiency  Intermittent iron deficiency anemia   CURRENT THERAPY:  Aranesp 300 mcg subcutaneous as needed for hemoglobin less than 10 IV iron as indicated    INTERIM HISTORY:  Karen [name] comes in for followup. She is doing okay. She feels better. She's not as tired. We gave her Aranesp when she was here last. Her iron studies which her left ear she ferratin of 324. Iron saturation was 35%..  She's had no bleeding. She's had no nausea vomiting. Her thyroid has been doing okay. There's been no change in her medications. She's had no weight loss or weight gain. She's had no headache.      PHYSICAL EXAMINATION:  well-developed well-nourished [name]. Vital signs are temperature of 97.6. Weight is 171 pounds. Blood pressure 131/80. Head and neck exam shows no scleral icterus. No oral lesions are noted. No adenopathy on the neck thyroid is not palpable. Lungs are clear. Cardiac exam regular rate and rhythm with no murmurs rubs or bruits. Abdomen is soft. No palpable liver or spleen tip. Good bowel sounds. No fluid wave. Extremities no clubbing cyanosis or edema. Skin is slightly dry.    LABORATORY STUDIES:   White cell count 3.4. Hemoglobin 10.8. Platelet count 203. MCV is 93.   IMPRESSION:   Karen [name] is a 53 year old [name] with multifactorial anemia. She responded very well to Aranesp.    I think we can get her back in 6 weeks now.    Don't think there are studies will be low.   Volanda Napoleon, MD 04/13/2013

## 2013-04-14 ENCOUNTER — Telehealth: Payer: Self-pay | Admitting: Nurse Practitioner

## 2013-04-14 NOTE — Telephone Encounter (Addendum)
Message copied by Jimmy Footman on Wed Apr 14, 2013 10:58 AM ------      Message from: Volanda Napoleon      Created: Tue Apr 13, 2013  4:19 PM       Call - iron is good!!  Pete -----LVM on pt's personal machine and advised. Instructed pt to contact office with any further questions or concerns.

## 2013-04-15 LAB — RETICULOCYTES (CHCC)
ABS RETIC: 33.9 10*3/uL (ref 19.0–186.0)
RBC.: 3.77 MIL/uL — ABNORMAL LOW (ref 3.87–5.11)
RETIC CT PCT: 0.9 % (ref 0.4–2.3)

## 2013-04-15 LAB — HEMOGLOBINOPATHY EVALUATION
HEMOGLOBIN OTHER: 0 %
HGB A: 97.5 % (ref 96.8–97.8)
Hgb A2 Quant: 2.5 % (ref 2.2–3.2)
Hgb F Quant: 0 % (ref 0.0–2.0)
Hgb S Quant: 0 %

## 2013-05-28 ENCOUNTER — Ambulatory Visit: Payer: BC Managed Care – PPO

## 2013-05-28 ENCOUNTER — Encounter: Payer: Self-pay | Admitting: Hematology & Oncology

## 2013-05-28 ENCOUNTER — Other Ambulatory Visit (HOSPITAL_BASED_OUTPATIENT_CLINIC_OR_DEPARTMENT_OTHER): Payer: BC Managed Care – PPO | Admitting: Lab

## 2013-05-28 ENCOUNTER — Ambulatory Visit (HOSPITAL_BASED_OUTPATIENT_CLINIC_OR_DEPARTMENT_OTHER): Payer: BC Managed Care – PPO | Admitting: Hematology & Oncology

## 2013-05-28 VITALS — BP 133/73 | HR 59 | Temp 98.6°F | Resp 14 | Ht 67.0 in | Wt 173.0 lb

## 2013-05-28 DIAGNOSIS — D509 Iron deficiency anemia, unspecified: Secondary | ICD-10-CM

## 2013-05-28 LAB — CBC WITH DIFFERENTIAL (CANCER CENTER ONLY)
BASO#: 0 10*3/uL (ref 0.0–0.2)
BASO%: 0.4 % (ref 0.0–2.0)
EOS ABS: 0 10*3/uL (ref 0.0–0.5)
EOS%: 0.4 % (ref 0.0–7.0)
HEMATOCRIT: 32.8 % — AB (ref 34.8–46.6)
HGB: 10.4 g/dL — ABNORMAL LOW (ref 11.6–15.9)
LYMPH#: 1.1 10*3/uL (ref 0.9–3.3)
LYMPH%: 45.6 % (ref 14.0–48.0)
MCH: 29.2 pg (ref 26.0–34.0)
MCHC: 31.7 g/dL — ABNORMAL LOW (ref 32.0–36.0)
MCV: 92 fL (ref 81–101)
MONO#: 0.2 10*3/uL (ref 0.1–0.9)
MONO%: 9.3 % (ref 0.0–13.0)
NEUT%: 44.3 % (ref 39.6–80.0)
NEUTROS ABS: 1.1 10*3/uL — AB (ref 1.5–6.5)
Platelets: 197 10*3/uL (ref 145–400)
RBC: 3.56 10*6/uL — AB (ref 3.70–5.32)
RDW: 12.5 % (ref 11.1–15.7)
WBC: 2.5 10*3/uL — ABNORMAL LOW (ref 3.9–10.0)

## 2013-05-28 LAB — IRON AND TIBC CHCC
%SAT: 36 % (ref 21–57)
Iron: 88 ug/dL (ref 41–142)
TIBC: 241 ug/dL (ref 236–444)
UIBC: 154 ug/dL (ref 120–384)

## 2013-05-28 LAB — RETICULOCYTES (CHCC)
ABS Retic: 28.7 10*3/uL (ref 19.0–186.0)
RBC.: 3.59 MIL/uL — ABNORMAL LOW (ref 3.87–5.11)
RETIC CT PCT: 0.8 % (ref 0.4–2.3)

## 2013-05-28 LAB — FERRITIN CHCC: FERRITIN: 207 ng/mL (ref 9–269)

## 2013-05-28 LAB — CHCC SATELLITE - SMEAR

## 2013-05-28 NOTE — Progress Notes (Signed)
Hematology and Oncology Follow Up Visit  Karen [name] [id number] 1960/02/10 53 y.o. 05/28/2013   Principle Diagnosis:   Anemia of renal insufficiency Intermittent iron deficiency anemia  Current Therapy:   Aranesp 300 mcg subcutaneous for hemoglobin less than 10 IV iron as indicated    Interim History:  [name] is back for followup. She's doing okay. She's had no problems since her last saw her. There's been no issues with nausea vomiting. She's had no bleeding. She did not have her monthly cycles anymore. She's had a good appetite. She still working. She's had no leg swelling. There's been no rashes. Her thyroid has been fairly well regulated.  Medications: Current outpatient prescriptions:buPROPion (WELLBUTRIN SR) 150 MG 12 hr tablet, Take 150 mg by mouth every morning. , Disp: , Rfl: ;  escitalopram (LEXAPRO) 20 MG tablet, Take 20 mg by mouth daily.  , Disp: , Rfl: ;  haloperidol (HALDOL) 1 MG tablet, Take 1 mg by mouth every morning. , Disp: , Rfl: ;  levothyroxine (SYNTHROID, LEVOTHROID) 150 MCG tablet, Take 1 tablet (150 mcg total) by mouth daily before breakfast., Disp: 100 tablet, Rfl: 3 omeprazole (PRILOSEC) 20 MG capsule, TAKE 1 CAPSULE (20 MG TOTAL) BY MOUTH DAILY., Disp: 90 capsule, Rfl: 3;  zolpidem (AMBIEN) 10 MG tablet, Take 10 mg by mouth at bedtime as needed. For sleep, Disp: , Rfl:   Allergies: No Known Allergies  Past Medical History, Surgical history, Social history, and Family History were reviewed and updated.  Review of Systems: As above  Physical Exam:  height is 5\' 7"  (1.702 m) and weight is 173 lb (78.472 kg). Her oral temperature is 98.6 F (37 C). Her blood pressure is 133/73 and her pulse is 59. Her respiration is 14.   Well-developed and well-nourished. No ocular or oral lesions. No adenopathy on the neck. Lungs are clear. Cardiac exam regular rate and rhythm. Abdomen soft. She has good bowel sounds. There is no fluid wave. There is a palpable liver or  spleen. Extremities shows no clubbing cyanosis or edema. Skin no rashes.  Lab Results  Component Value Date   WBC 2.5* 05/28/2013   HGB 10.4* 05/28/2013   HCT 32.8* 05/28/2013   MCV 92 05/28/2013   PLT 197 05/28/2013     Chemistry      Component Value Date/Time   NA 144 11/25/2012 1210   K 3.5 11/25/2012 1210   CL 107 11/25/2012 1210   CO2 29 11/25/2012 1210   BUN 12 11/25/2012 1210   CREATININE 1.0 11/25/2012 1210      Component Value Date/Time   CALCIUM 9.4 11/25/2012 1210   ALKPHOS 91 11/25/2012 1210   AST 21 11/25/2012 1210   ALT 14 11/25/2012 1210   BILITOT 0.7 11/25/2012 1210         Impression and Plan: Karen [name] is 53 year old [name]. She is multifactorial anemia.  We will continue to watch her for right now. She does not need any Aranesp. I would not think she needs any iron.  We'll plan to go back to see [place] in another 2 months.   Volanda Napoleon, MD 4/24/20151:01 PM

## 2013-05-31 ENCOUNTER — Telehealth: Payer: Self-pay

## 2013-05-31 NOTE — Telephone Encounter (Addendum)
Message copied by Johny Drilling on Mon May 31, 2013  1:23 PM ------      Message from: Volanda Napoleon      Created: Sun May 30, 2013  8:59 AM       Call - iron is ok. pete ------ Generic message left on non personalized VM to contact our office for good news on lab results. dph

## 2013-07-30 ENCOUNTER — Other Ambulatory Visit (HOSPITAL_BASED_OUTPATIENT_CLINIC_OR_DEPARTMENT_OTHER): Payer: BC Managed Care – PPO | Admitting: Lab

## 2013-07-30 ENCOUNTER — Telehealth: Payer: Self-pay | Admitting: Hematology & Oncology

## 2013-07-30 ENCOUNTER — Ambulatory Visit: Payer: BC Managed Care – PPO

## 2013-07-30 ENCOUNTER — Ambulatory Visit (HOSPITAL_BASED_OUTPATIENT_CLINIC_OR_DEPARTMENT_OTHER): Payer: BC Managed Care – PPO | Admitting: Hematology & Oncology

## 2013-07-30 VITALS — BP 128/73 | HR 58 | Temp 97.8°F | Resp 14 | Wt 175.0 lb

## 2013-07-30 DIAGNOSIS — D509 Iron deficiency anemia, unspecified: Secondary | ICD-10-CM

## 2013-07-30 DIAGNOSIS — D649 Anemia, unspecified: Secondary | ICD-10-CM

## 2013-07-30 DIAGNOSIS — E039 Hypothyroidism, unspecified: Secondary | ICD-10-CM

## 2013-07-30 LAB — CBC WITH DIFFERENTIAL (CANCER CENTER ONLY)
BASO#: 0 10*3/uL (ref 0.0–0.2)
BASO%: 0.4 % (ref 0.0–2.0)
EOS%: 2.1 % (ref 0.0–7.0)
Eosinophils Absolute: 0.1 10*3/uL (ref 0.0–0.5)
HEMATOCRIT: 33 % — AB (ref 34.8–46.6)
HGB: 10.4 g/dL — ABNORMAL LOW (ref 11.6–15.9)
LYMPH#: 1.7 10*3/uL (ref 0.9–3.3)
LYMPH%: 61.2 % — AB (ref 14.0–48.0)
MCH: 29.4 pg (ref 26.0–34.0)
MCHC: 31.5 g/dL — AB (ref 32.0–36.0)
MCV: 93 fL (ref 81–101)
MONO#: 0.2 10*3/uL (ref 0.1–0.9)
MONO%: 8.2 % (ref 0.0–13.0)
NEUT#: 0.8 10*3/uL — ABNORMAL LOW (ref 1.5–6.5)
NEUT%: 28.1 % — AB (ref 39.6–80.0)
PLATELETS: 172 10*3/uL (ref 145–400)
RBC: 3.54 10*6/uL — ABNORMAL LOW (ref 3.70–5.32)
RDW: 12.7 % (ref 11.1–15.7)
WBC: 2.8 10*3/uL — ABNORMAL LOW (ref 3.9–10.0)

## 2013-07-30 LAB — RETICULOCYTES (CHCC)
ABS RETIC: 24.9 10*3/uL (ref 19.0–186.0)
RBC.: 3.55 MIL/uL — ABNORMAL LOW (ref 3.87–5.11)
RETIC CT PCT: 0.7 % (ref 0.4–2.3)

## 2013-07-30 NOTE — Progress Notes (Signed)
Patient comes in today, CBC checked, Aranesp injection not given due to parameters.  Hgb was 10.4 .  Patient made aware, seen by Dr. Marin Olp.

## 2013-07-30 NOTE — Telephone Encounter (Signed)
Mailed august schedule °

## 2013-07-30 NOTE — Telephone Encounter (Signed)
na

## 2013-08-02 LAB — FERRITIN CHCC: FERRITIN: 228 ng/mL (ref 9–269)

## 2013-08-02 LAB — IRON AND TIBC CHCC
%SAT: 43 % (ref 21–57)
Iron: 99 ug/dL (ref 41–142)
TIBC: 231 ug/dL — ABNORMAL LOW (ref 236–444)
UIBC: 132 ug/dL (ref 120–384)

## 2013-08-03 ENCOUNTER — Telehealth: Payer: Self-pay | Admitting: *Deleted

## 2013-08-03 NOTE — Telephone Encounter (Addendum)
Message copied by Lenn Sink on Tue Aug 03, 2013 12:56 PM ------      Message from: Burney Gauze R      Created: Mon Aug 02, 2013  6:05 PM       Call - iron is ok!!  pete ------Informed pt that iron was okay.

## 2013-08-04 NOTE — Progress Notes (Signed)
Hematology and Oncology Follow Up Visit  Karen [name] [id number] 1960-11-28 53 y.o. 08/04/2013   Principle Diagnosis:   Anemia of renal insufficiency Intermittent iron deficiency anemia  Current Therapy:   Aranesp 300 mcg subcutaneous for hemoglobin less than 10 IV iron as indicated    Interim History:  Karen [name] is back for followup. She's doing okay. She's had no problems since her last saw her. There's been no issues with nausea vomiting. She's had no bleeding. She did not have her monthly cycles anymore. She's had a good appetite. She still working. She's had no leg swelling. There's been no rashes. Her thyroid has been fairly well regulated.  Medications: Current outpatient prescriptions:buPROPion (WELLBUTRIN SR) 150 MG 12 hr tablet, Take 150 mg by mouth every morning. , Disp: , Rfl: ;  escitalopram (LEXAPRO) 20 MG tablet, Take 20 mg by mouth daily.  , Disp: , Rfl: ;  haloperidol (HALDOL) 1 MG tablet, Take 1 mg by mouth every morning. , Disp: , Rfl: ;  levothyroxine (SYNTHROID, LEVOTHROID) 150 MCG tablet, Take 1 tablet (150 mcg total) by mouth daily before breakfast., Disp: 100 tablet, Rfl: 3 omeprazole (PRILOSEC) 20 MG capsule, TAKE 1 CAPSULE (20 MG TOTAL) BY MOUTH DAILY., Disp: 90 capsule, Rfl: 3;  zolpidem (AMBIEN) 10 MG tablet, Take 10 mg by mouth at bedtime as needed. For sleep, Disp: , Rfl:   Allergies: No Known Allergies  Past Medical History, Surgical history, Social history, and Family History were reviewed and updated.  Review of Systems: As above  Physical Exam:  weight is 175 lb (79.379 kg). Her oral temperature is 97.8 F (36.6 C). Her blood pressure is 128/73 and her pulse is 58. Her respiration is 14.   Well-developed and well-nourished. No ocular or oral lesions. No adenopathy on the neck. Lungs are clear. Cardiac exam regular rate and rhythm. Abdomen soft. She has good bowel sounds. There is no fluid wave. There is a palpable liver or spleen. Extremities shows no  clubbing cyanosis or edema. Skin no rashes.  Lab Results  Component Value Date   WBC 2.8* 07/30/2013   HGB 10.4* 07/30/2013   HCT 33.0* 07/30/2013   MCV 93 07/30/2013   PLT 172 07/30/2013     Chemistry      Component Value Date/Time   NA 144 11/25/2012 1210   K 3.5 11/25/2012 1210   CL 107 11/25/2012 1210   CO2 29 11/25/2012 1210   BUN 12 11/25/2012 1210   CREATININE 1.0 11/25/2012 1210      Component Value Date/Time   CALCIUM 9.4 11/25/2012 1210   ALKPHOS 91 11/25/2012 1210   AST 21 11/25/2012 1210   ALT 14 11/25/2012 1210   BILITOT 0.7 11/25/2012 1210     Her ferritin is 228. Iron saturation is 43%. Total iron is 99.   Impression and Plan: Karen [name] is 53 year old [name]. She is multifactorial anemia.  We will continue to watch her for right now. She does not need any Aranesp. I would not think she needs any iron.  We'll plan to go back to see [place] in another 2 months.   Volanda Napoleon, MD 7/1/20157:11 AM

## 2013-09-17 ENCOUNTER — Telehealth: Payer: Self-pay | Admitting: Hematology & Oncology

## 2013-09-17 NOTE — Telephone Encounter (Signed)
Pt moved 8-28 to 9-1

## 2013-09-22 ENCOUNTER — Encounter: Payer: Self-pay | Admitting: Gastroenterology

## 2013-10-01 ENCOUNTER — Other Ambulatory Visit: Payer: BC Managed Care – PPO | Admitting: Lab

## 2013-10-01 ENCOUNTER — Ambulatory Visit: Payer: BC Managed Care – PPO

## 2013-10-01 ENCOUNTER — Ambulatory Visit: Payer: BC Managed Care – PPO | Admitting: Hematology & Oncology

## 2013-10-05 ENCOUNTER — Ambulatory Visit (HOSPITAL_BASED_OUTPATIENT_CLINIC_OR_DEPARTMENT_OTHER): Payer: BC Managed Care – PPO | Admitting: Hematology & Oncology

## 2013-10-05 ENCOUNTER — Other Ambulatory Visit (HOSPITAL_BASED_OUTPATIENT_CLINIC_OR_DEPARTMENT_OTHER): Payer: BC Managed Care – PPO | Admitting: Lab

## 2013-10-05 ENCOUNTER — Telehealth: Payer: Self-pay | Admitting: Hematology & Oncology

## 2013-10-05 ENCOUNTER — Ambulatory Visit: Payer: BC Managed Care – PPO

## 2013-10-05 ENCOUNTER — Encounter: Payer: Self-pay | Admitting: Hematology & Oncology

## 2013-10-05 VITALS — BP 136/82 | HR 54 | Temp 98.1°F | Resp 14 | Ht 67.0 in | Wt 177.0 lb

## 2013-10-05 DIAGNOSIS — D509 Iron deficiency anemia, unspecified: Secondary | ICD-10-CM

## 2013-10-05 DIAGNOSIS — D631 Anemia in chronic kidney disease: Secondary | ICD-10-CM

## 2013-10-05 DIAGNOSIS — E039 Hypothyroidism, unspecified: Secondary | ICD-10-CM

## 2013-10-05 DIAGNOSIS — D649 Anemia, unspecified: Secondary | ICD-10-CM

## 2013-10-05 DIAGNOSIS — N189 Chronic kidney disease, unspecified: Secondary | ICD-10-CM

## 2013-10-05 DIAGNOSIS — N289 Disorder of kidney and ureter, unspecified: Secondary | ICD-10-CM

## 2013-10-05 DIAGNOSIS — D500 Iron deficiency anemia secondary to blood loss (chronic): Secondary | ICD-10-CM

## 2013-10-05 LAB — CBC WITH DIFFERENTIAL (CANCER CENTER ONLY)
BASO#: 0 10*3/uL (ref 0.0–0.2)
BASO%: 0 % (ref 0.0–2.0)
EOS%: 1 % (ref 0.0–7.0)
Eosinophils Absolute: 0 10*3/uL (ref 0.0–0.5)
HCT: 31.7 % — ABNORMAL LOW (ref 34.8–46.6)
HEMOGLOBIN: 10.2 g/dL — AB (ref 11.6–15.9)
LYMPH#: 1.7 10*3/uL (ref 0.9–3.3)
LYMPH%: 54.2 % — ABNORMAL HIGH (ref 14.0–48.0)
MCH: 29.6 pg (ref 26.0–34.0)
MCHC: 32.2 g/dL (ref 32.0–36.0)
MCV: 92 fL (ref 81–101)
MONO#: 0.4 10*3/uL (ref 0.1–0.9)
MONO%: 11.8 % (ref 0.0–13.0)
NEUT#: 1 10*3/uL — ABNORMAL LOW (ref 1.5–6.5)
NEUT%: 33 % — ABNORMAL LOW (ref 39.6–80.0)
PLATELETS: 188 10*3/uL (ref 145–400)
RBC: 3.45 10*6/uL — ABNORMAL LOW (ref 3.70–5.32)
RDW: 12.4 % (ref 11.1–15.7)
WBC: 3.1 10*3/uL — ABNORMAL LOW (ref 3.9–10.0)

## 2013-10-05 LAB — CMP (CANCER CENTER ONLY)
ALK PHOS: 74 U/L (ref 26–84)
ALT(SGPT): 12 U/L (ref 10–47)
AST: 16 U/L (ref 11–38)
Albumin: 3.6 g/dL (ref 3.3–5.5)
BUN, Bld: 8 mg/dL (ref 7–22)
CO2: 26 mEq/L (ref 18–33)
CREATININE: 1.1 mg/dL (ref 0.6–1.2)
Calcium: 8.8 mg/dL (ref 8.0–10.3)
Chloride: 106 mEq/L (ref 98–108)
Glucose, Bld: 93 mg/dL (ref 73–118)
Potassium: 2.9 mEq/L — CL (ref 3.3–4.7)
SODIUM: 138 meq/L (ref 128–145)
Total Bilirubin: 0.9 mg/dl (ref 0.20–1.60)
Total Protein: 7.1 g/dL (ref 6.4–8.1)

## 2013-10-05 LAB — IRON AND TIBC CHCC
%SAT: 41 % (ref 21–57)
Iron: 100 ug/dL (ref 41–142)
TIBC: 243 ug/dL (ref 236–444)
UIBC: 142 ug/dL (ref 120–384)

## 2013-10-05 LAB — CHCC SATELLITE - SMEAR

## 2013-10-05 LAB — FERRITIN CHCC: Ferritin: 254 ng/ml (ref 9–269)

## 2013-10-05 NOTE — Telephone Encounter (Signed)
Iron deficiency anemia, unspecified - Primary 280.9  Anemia, unspecified  - 285.9  [id number] PR DARBEPOETIN ALFA, NON-ESRD  Per Denise/Sherry @ [employer]. However, may require predetermination upon billing.  Approved: [id number]  Case Mgr: Leverne Humbles  @ P: [phone]

## 2013-10-05 NOTE — Progress Notes (Signed)
  Hematology and Oncology Follow Up Visit  Karen [name] [id number] November 08, 1960 53 y.o. 10/05/2013   Principle Diagnosis:   Anemia of renal insufficiency Intermittent iron deficiency anemia   Current Therapy:   Aranesp 300 mcg subcutaneous for hemoglobin less than 10 IV iron as indicated      Interim History:  [name] is back for followup. She feels very well. 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  She's not required any Aranesp for quite a while. It is probably been over a year since we give her any Aranesp. Her last iron studies done back in June 2 a ferritin of 220 with an iron saturation of 43%.  She's had no problems with bleeding. She's had no fever. She's had no nausea or vomiting. There's been no weight loss or weight gain. She's had no headache. She had no leg swelling.  Medications: Current outpatient prescriptions:buPROPion (WELLBUTRIN SR) 150 MG 12 hr tablet, Take 150 mg by mouth every morning. , Disp: , Rfl: ;  escitalopram (LEXAPRO) 20 MG tablet, Take 20 mg by mouth daily.  , Disp: , Rfl: ;  haloperidol (HALDOL) 1 MG tablet, Take 1 mg by mouth every morning. , Disp: , Rfl: ;  levothyroxine (SYNTHROID, LEVOTHROID) 150 MCG tablet, Take 1 tablet (150 mcg total) by mouth daily before breakfast., Disp: 100 tablet, Rfl: 3 omeprazole (PRILOSEC) 20 MG capsule, TAKE 1 CAPSULE (20 MG TOTAL) BY MOUTH DAILY., Disp: 90 capsule, Rfl: 3;  zolpidem (AMBIEN) 10 MG tablet, Take 10 mg by mouth at bedtime as needed. For sleep, Disp: , Rfl:   Allergies: No Known Allergies  Past Medical History, Surgical history, Social history, and Family History were reviewed and updated.  Review of Systems: As above  Physical Exam:  height is 5\' 7"  (1.702 m) and weight is 177 lb (80.287 kg). Her oral temperature is 98.1 F (36.7 C). Her blood pressure is 136/82 and her pulse is 54. Her respiration is 14.   Well-developed and well-nourished. No ocular or  oral lesions. No adenopathy on the neck. Lungs are clear. Cardiac exam regular rate and rhythm. Abdomen soft. She has good bowel sounds. There is no fluid wave. There is a palpable liver or spleen. Extremities shows no clubbing cyanosis or edema. Skin no rashes.  Lab Results  Component Value Date   WBC 3.1* 10/05/2013   HGB 10.2* 10/05/2013   HCT 31.7* 10/05/2013   MCV 92 10/05/2013   PLT 188 10/05/2013     Chemistry      Component Value Date/Time   NA 144 11/25/2012 1210   K 3.5 11/25/2012 1210   CL 107 11/25/2012 1210   CO2 29 11/25/2012 1210   BUN 12 11/25/2012 1210   CREATININE 1.0 11/25/2012 1210      Component Value Date/Time   CALCIUM 9.4 11/25/2012 1210   ALKPHOS 91 11/25/2012 1210   AST 21 11/25/2012 1210   ALT 14 11/25/2012 1210   BILITOT 0.7 11/25/2012 1210         Impression and Plan: Karen [name] is 53 year old [name]. Is multifactorial anemia. She's doing quite well right now.  I don't see that we have to do anything for her. She feels well. She really does not need any additional improvement with her blood counts.  I think we can probably get her back to see [place] in about 6-8 weeks.   Volanda Napoleon, MD 9/1/201510:25 AM

## 2013-10-08 LAB — HEMOGLOBINOPATHY EVALUATION
HEMOGLOBIN OTHER: 0 %
HGB A2 QUANT: 2.5 % (ref 2.2–3.2)
Hgb A: 97.5 % (ref 96.8–97.8)
Hgb F Quant: 0 % (ref 0.0–2.0)
Hgb S Quant: 0 %

## 2013-10-08 LAB — RETICULOCYTES (CHCC)
ABS Retic: 41.3 10*3/uL (ref 19.0–186.0)
RBC.: 3.44 MIL/uL — ABNORMAL LOW (ref 3.87–5.11)
Retic Ct Pct: 1.2 % (ref 0.4–2.3)

## 2013-10-12 ENCOUNTER — Encounter: Payer: Self-pay | Admitting: Gastroenterology

## 2013-11-11 ENCOUNTER — Encounter: Payer: Self-pay | Admitting: Gastroenterology

## 2013-12-02 ENCOUNTER — Other Ambulatory Visit: Payer: Self-pay

## 2013-12-02 DIAGNOSIS — Z1231 Encounter for screening mammogram for malignant neoplasm of breast: Secondary | ICD-10-CM

## 2013-12-07 ENCOUNTER — Ambulatory Visit: Payer: BC Managed Care – PPO

## 2013-12-07 ENCOUNTER — Ambulatory Visit: Payer: BC Managed Care – PPO | Admitting: Family

## 2013-12-07 ENCOUNTER — Other Ambulatory Visit: Payer: BC Managed Care – PPO | Admitting: Lab

## 2013-12-15 ENCOUNTER — Ambulatory Visit: Payer: BC Managed Care – PPO | Admitting: *Deleted

## 2013-12-15 ENCOUNTER — Ambulatory Visit (INDEPENDENT_AMBULATORY_CARE_PROVIDER_SITE_OTHER): Payer: BC Managed Care – PPO | Admitting: *Deleted

## 2013-12-15 ENCOUNTER — Ambulatory Visit (HOSPITAL_BASED_OUTPATIENT_CLINIC_OR_DEPARTMENT_OTHER): Payer: BC Managed Care – PPO | Admitting: Family

## 2013-12-15 ENCOUNTER — Encounter: Payer: Self-pay | Admitting: Family

## 2013-12-15 ENCOUNTER — Ambulatory Visit: Payer: BC Managed Care – PPO

## 2013-12-15 ENCOUNTER — Other Ambulatory Visit (HOSPITAL_BASED_OUTPATIENT_CLINIC_OR_DEPARTMENT_OTHER): Payer: BC Managed Care – PPO | Admitting: Lab

## 2013-12-15 DIAGNOSIS — Z23 Encounter for immunization: Secondary | ICD-10-CM

## 2013-12-15 DIAGNOSIS — D509 Iron deficiency anemia, unspecified: Secondary | ICD-10-CM

## 2013-12-15 DIAGNOSIS — D649 Anemia, unspecified: Secondary | ICD-10-CM

## 2013-12-15 LAB — CBC WITH DIFFERENTIAL (CANCER CENTER ONLY)
BASO#: 0 10*3/uL (ref 0.0–0.2)
BASO%: 0.3 % (ref 0.0–2.0)
EOS ABS: 0 10*3/uL (ref 0.0–0.5)
EOS%: 1.3 % (ref 0.0–7.0)
HCT: 31.4 % — ABNORMAL LOW (ref 34.8–46.6)
HGB: 10.1 g/dL — ABNORMAL LOW (ref 11.6–15.9)
LYMPH#: 1.5 10*3/uL (ref 0.9–3.3)
LYMPH%: 48.3 % — ABNORMAL HIGH (ref 14.0–48.0)
MCH: 29.3 pg (ref 26.0–34.0)
MCHC: 32.2 g/dL (ref 32.0–36.0)
MCV: 91 fL (ref 81–101)
MONO#: 0.3 10*3/uL (ref 0.1–0.9)
MONO%: 8.7 % (ref 0.0–13.0)
NEUT#: 1.2 10*3/uL — ABNORMAL LOW (ref 1.5–6.5)
NEUT%: 41.4 % (ref 39.6–80.0)
PLATELETS: 196 10*3/uL (ref 145–400)
RBC: 3.45 10*6/uL — AB (ref 3.70–5.32)
RDW: 12.3 % (ref 11.1–15.7)
WBC: 3 10*3/uL — AB (ref 3.9–10.0)

## 2013-12-15 LAB — IRON AND TIBC CHCC
%SAT: 34 % (ref 21–57)
IRON: 79 ug/dL (ref 41–142)
TIBC: 236 ug/dL (ref 236–444)
UIBC: 157 ug/dL (ref 120–384)

## 2013-12-15 LAB — RETICULOCYTES (CHCC)
ABS Retic: 35.1 10*3/uL (ref 19.0–186.0)
RBC.: 3.51 MIL/uL — ABNORMAL LOW (ref 3.87–5.11)
Retic Ct Pct: 1 % (ref 0.4–2.3)

## 2013-12-15 LAB — FERRITIN CHCC: FERRITIN: 197 ng/mL (ref 9–269)

## 2013-12-15 NOTE — Progress Notes (Signed)
[facility]  Telephone:([phone] Fax:([phone]) [phone]  ID: Karen [name] OB: 05-21-1960 MR#: [id number] LYY#:[id number] Patient Care Team: Dorena Cookey, MD as PCP - General  DIAGNOSIS: Anemia of renal insufficiency Intermittent iron deficiency anemia   INTERVAL HISTORY: Karen [name] is here today for follow-up. She is doing well. She has had no issues. She denies fever, chills, n/v, cough, rash, headache, dizziness, SOB, chest pain, palpitations, abdominal pain, constipation, diarrhea, blood in urine or stool. No swelling, tenderness, numbness or tingling in her extremities. Her appetite is good and she is staying hydrated. Her weight is stable. She is still working. She has not had to have Aranesp since November 2014.   CURRENT TREATMENT: Aranesp 300 mcg subcutaneous for hemoglobin less than 10 IV iron as indicated  REVIEW OF SYSTEMS: All other 10 point review of systems is negative.   PAST MEDICAL HISTORY: Past Medical History  Diagnosis Date  . Anemia   . Depression   . Hypothyroidism   . DUB (dysfunctional uterine bleeding)   . GERD (gastroesophageal reflux disease)     PAST SURGICAL HISTORY: Past Surgical History  Procedure Laterality Date  . Foot surgery      Left  . Cholecystectomy N/A 07/16/2012    Procedure: LAPAROSCOPIC CHOLECYSTECTOMY WITH INTRAOPERATIVE CHOLANGIOGRAM;  Surgeon: Joyice Faster. Cornett, MD;  Location: [facility];  Service: General;  Laterality: N/A;    FAMILY HISTORY Family History  Problem Relation Age of Onset  . Hypertension Mother   . Asthma Mother   . Colon cancer Neg Hx   . Cancer Father 64    GYNECOLOGIC HISTORY:  No LMP recorded. Patient is postmenopausal.   SOCIAL HISTORY: History   Social History  . Marital Status: Married    Spouse Name: N/A    Number of Children: 0  . Years of Education: N/A   Occupational History  .  [employer]   Social History Main Topics  . Smoking status: Former Smoker -- 1.00 packs/day for 20  years    Types: Cigarettes    Start date: 03/13/1967    Quit date: 07/14/1987  . Smokeless tobacco: Never Used     Comment: quit 26 years ago  . Alcohol Use: No  . Drug Use: No  . Sexual Activity: Not on file   Other Topics Concern  . Not on file   Social History Narrative   Occupation: Freight forwarder   Regular exercise- yes   0 caffeine drinks     ADVANCED DIRECTIVES:  <no information>  HEALTH MAINTENANCE: History  Substance Use Topics  . Smoking status: Former Smoker -- 1.00 packs/day for 20 years    Types: Cigarettes    Start date: 03/13/1967    Quit date: 07/14/1987  . Smokeless tobacco: Never Used     Comment: quit 26 years ago  . Alcohol Use: No   Colonoscopy: PAP: Bone density: Lipid panel:  No Known Allergies  Current Outpatient Prescriptions  Medication Sig Dispense Refill  . buPROPion (WELLBUTRIN SR) 150 MG 12 hr tablet Take 150 mg by mouth every morning.     . escitalopram (LEXAPRO) 20 MG tablet Take 20 mg by mouth daily.      . haloperidol (HALDOL) 1 MG tablet Take 1 mg by mouth every morning.     Marland Kitchen levothyroxine (SYNTHROID, LEVOTHROID) 150 MCG tablet Take 1 tablet (150 mcg total) by mouth daily before breakfast. 100 tablet 3  . omeprazole (PRILOSEC) 20 MG capsule TAKE 1 CAPSULE (20 MG TOTAL) BY  MOUTH DAILY. 90 capsule 3  . zolpidem (AMBIEN) 10 MG tablet Take 10 mg by mouth at bedtime as needed. For sleep     No current facility-administered medications for this visit.    OBJECTIVE: Filed Vitals:   12/15/13 1058  BP: 127/77  Pulse: 55  Temp: 97.9 F (36.6 C)  Resp: 14    Filed Weights   12/15/13 1058  Weight: 178 lb (80.74 kg)   ECOG FS:0 - Asymptomatic Ocular: Sclerae unicteric, pupils equal, round and reactive to light Ear-nose-throat: Oropharynx clear, dentition fair Lymphatic: No cervical or supraclavicular adenopathy Lungs no rales or rhonchi, good excursion bilaterally Heart regular rate and rhythm, no murmur appreciated Abd  soft, nontender, positive bowel sounds MSK no focal spinal tenderness, no joint edema Neuro: non-focal, well-oriented, appropriate affect Breasts: Deferred  LAB RESULTS: CMP     Component Value Date/Time   NA 138 10/05/2013 0932   NA 144 11/25/2012 1210   K 2.9* 10/05/2013 0932   K 3.5 11/25/2012 1210   CL 106 10/05/2013 0932   CL 107 11/25/2012 1210   CO2 26 10/05/2013 0932   CO2 29 11/25/2012 1210   GLUCOSE 93 10/05/2013 0932   GLUCOSE 92 11/25/2012 1210   BUN 8 10/05/2013 0932   BUN 12 11/25/2012 1210   CREATININE 1.1 10/05/2013 0932   CREATININE 1.0 11/25/2012 1210   CALCIUM 8.8 10/05/2013 0932   CALCIUM 9.4 11/25/2012 1210   PROT 7.1 10/05/2013 0932   PROT 7.0 11/25/2012 1210   ALBUMIN 3.8 11/25/2012 1210   AST 16 10/05/2013 0932   AST 21 11/25/2012 1210   ALT 12 10/05/2013 0932   ALT 14 11/25/2012 1210   ALKPHOS 74 10/05/2013 0932   ALKPHOS 91 11/25/2012 1210   BILITOT 0.90 10/05/2013 0932   BILITOT 0.7 11/25/2012 1210   GFRNONAA 60* 07/16/2012 0540   GFRAA 69* 07/16/2012 0540   INo results found for: SPEP, UPEP Lab Results  Component Value Date   WBC 3.0* 12/15/2013   NEUTROABS 1.2* 12/15/2013   HGB 10.1* 12/15/2013   HCT 31.4* 12/15/2013   MCV 91 12/15/2013   PLT 196 12/15/2013   No results found for: LABCA2 No components found for: IOEVO350 No results for input(s): INR in the last 168 hours.  STUDIES: No results found.  ASSESSMENT/PLAN: Karen [name] is 53 year old [name] with multifactorial anemia.  Her Hgb today was 10.1. She does not need any Aranesp at this time. We will see what her iron studies show.  We will see her back in 3 months for labs and follow-up.   She knows to call here with any questions or concerns and to go to the ED in the event of an emergency. We can certainly see her sooner if need be.   Eliezer Bottom, NP 12/15/2013 11:26 AM

## 2013-12-16 ENCOUNTER — Telehealth: Payer: Self-pay | Admitting: *Deleted

## 2013-12-16 NOTE — Telephone Encounter (Signed)
-----   Message from Volanda Napoleon, MD sent at 12/15/2013  5:41 PM EST ----- Please call and let her know that the iron is okay. Laurey Arrow

## 2014-01-01 ENCOUNTER — Other Ambulatory Visit: Payer: Self-pay | Admitting: Family Medicine

## 2014-01-03 ENCOUNTER — Ambulatory Visit
Admission: RE | Admit: 2014-01-03 | Discharge: 2014-01-03 | Disposition: A | Discharge status: Home / Self Care | Payer: BC Managed Care – PPO | Source: Ambulatory Visit

## 2014-01-03 DIAGNOSIS — Z1231 Encounter for screening mammogram for malignant neoplasm of breast: Secondary | ICD-10-CM

## 2014-01-04 ENCOUNTER — Ambulatory Visit (AMBULATORY_SURGERY_CENTER): Payer: BC Managed Care – PPO

## 2014-01-04 VITALS — Ht 70.0 in | Wt 179.6 lb

## 2014-01-04 DIAGNOSIS — K2270 Barrett's esophagus without dysplasia: Secondary | ICD-10-CM

## 2014-01-04 NOTE — Progress Notes (Signed)
Per pt, no allergies to soy or egg products.Pt not taking any weight loss meds or using  O2 at home. 

## 2014-01-18 ENCOUNTER — Ambulatory Visit (AMBULATORY_SURGERY_CENTER): Payer: BC Managed Care – PPO | Admitting: Gastroenterology

## 2014-01-18 ENCOUNTER — Encounter: Payer: Self-pay | Admitting: Gastroenterology

## 2014-01-18 VITALS — BP 140/85 | HR 57 | Temp 96.6°F | Resp 16 | Ht 70.0 in | Wt 179.0 lb

## 2014-01-18 DIAGNOSIS — K209 Esophagitis, unspecified: Secondary | ICD-10-CM

## 2014-01-18 DIAGNOSIS — K2270 Barrett's esophagus without dysplasia: Secondary | ICD-10-CM

## 2014-01-18 MED ORDER — SODIUM CHLORIDE 0.9 % IV SOLN: 500.0000 mL | INTRAVENOUS | Status: DC

## 2014-01-18 NOTE — Progress Notes (Signed)
  [facility] Post-op Note  Patient: Karen [name]  Procedure(s) Performed: endoscopy  Patient Location: [facility] - Recovery Area  Anesthesia Type: Deep Sedation/Propofol  Level of Consciousness: awake, oriented and patient cooperative  Airway and Oxygen Therapy: Patient Spontanous Breathing  Post-op Pain: none  Post-op Assessment:  Post-op Vital signs reviewed, Patient's Cardiovascular Status Stable, Respiratory Function Stable, Patent Airway, No signs of Nausea or vomiting and Pain level controlled  Post-op Vital Signs: Reviewed and stable  Complications: No apparent anesthesia complications  Shawnise Peterkin E 8:46 AM

## 2014-01-18 NOTE — Progress Notes (Signed)
Called to room to assist during endoscopic procedure.  Patient ID and intended procedure confirmed with present staff. Received instructions for my participation in the procedure from the performing physician.  

## 2014-01-18 NOTE — Patient Instructions (Addendum)

## 2014-01-18 NOTE — Op Note (Addendum)
[place]  [address]. [place], [address]   ENDOSCOPY PROCEDURE REPORT  PATIENT: [name], Karen  MR#: [id number] BIRTHDATE: December 05, 1960 , 39  yrs. old GENDER: female ENDOSCOPIST: Ladene Artist, MD, [facility] PROCEDURE DATE:  01/18/2014 PROCEDURE:  EGD w/ biopsy ASA CLASS:     Class II INDICATIONS:  history of Barrett's esophagus. MEDICATIONS: Monitored anesthesia care and Propofol 150 mg IV TOPICAL ANESTHETIC: none DESCRIPTION OF PROCEDURE: After the risks benefits and alternatives of the procedure were thoroughly explained, informed consent was obtained.  The LB [id number] endoscope was introduced through the mouth and advanced to the second portion of the duodenum , Without limitations.  The instrument was slowly withdrawn as the mucosa was fully examined.    ESOPHAGUS: There was short segment Barrett's esophagus without dysplasia found in the distal esophagus.  The length of circumferential Barrett's was 1cm ([place] C1).  Barrett's Surveillance (4 quadrant biopsies).   The esophagus was otherwise normal. STOMACH: The mucosa of the stomach appeared normal. DUODENUM: The duodenal mucosa showed no abnormalities.  Retroflexed views revealed a hiatal hernia.     The scope was then withdrawn from the patient and the procedure completed.  COMPLICATIONS: There were no immediate complications.  ENDOSCOPIC IMPRESSION: 1.   Short segment Barrett's esophagus in the distal esophagus 2.   Small hiatal hernia 3.   The EGD otherwise appeared normal  RECOMMENDATIONS: 1.  Anti-reflux regimen 2.  Await pathology results 3.  Continue PPI 4.  Endoscopy in 3 years if no dysplasia  eSigned:  Ladene Artist, MD, [facility] 01/18/2014 9:01 AM

## 2014-01-19 ENCOUNTER — Telehealth: Payer: Self-pay

## 2014-01-19 NOTE — Telephone Encounter (Signed)
Left message on answering machine. 

## 2014-01-28 ENCOUNTER — Encounter: Payer: Self-pay | Admitting: Gastroenterology

## 2014-02-01 ENCOUNTER — Other Ambulatory Visit: Payer: Self-pay | Admitting: Family Medicine

## 2014-02-09 ENCOUNTER — Other Ambulatory Visit (INDEPENDENT_AMBULATORY_CARE_PROVIDER_SITE_OTHER): Payer: BLUE CROSS/BLUE SHIELD

## 2014-02-09 DIAGNOSIS — Z0000 Encounter for general adult medical examination without abnormal findings: Secondary | ICD-10-CM

## 2014-02-09 LAB — LIPID PANEL
CHOLESTEROL: 160 mg/dL (ref 0–200)
HDL: 42.1 mg/dL (ref >39.00)
LDL Cholesterol: 96 mg/dL (ref 0–99)
NonHDL: 117.9
Total CHOL/HDL Ratio: 4
Triglycerides: 108 mg/dL (ref 0.0–149.0)
VLDL: 21.6 mg/dL (ref 0.0–40.0)

## 2014-02-09 LAB — CBC WITH DIFFERENTIAL/PLATELET
BASOS PCT: 0.5 % (ref 0.0–3.0)
Basophils Absolute: 0 10*3/uL (ref 0.0–0.1)
EOS PCT: 0.9 % (ref 0.0–5.0)
Eosinophils Absolute: 0 10*3/uL (ref 0.0–0.7)
HCT: 32.4 % — ABNORMAL LOW (ref 36.0–46.0)
HEMOGLOBIN: 10.2 g/dL — AB (ref 12.0–15.0)
Lymphocytes Relative: 47.7 % — ABNORMAL HIGH (ref 12.0–46.0)
Lymphs Abs: 1.5 10*3/uL (ref 0.7–4.0)
MCHC: 31.5 g/dL (ref 30.0–36.0)
MCV: 92 fl (ref 78.0–100.0)
MONO ABS: 0.2 10*3/uL (ref 0.1–1.0)
Monocytes Relative: 7.5 % (ref 3.0–12.0)
Neutro Abs: 1.4 10*3/uL (ref 1.4–7.7)
Neutrophils Relative %: 43.4 % (ref 43.0–77.0)
Platelets: 174 10*3/uL (ref 150.0–400.0)
RBC: 3.52 Mil/uL — ABNORMAL LOW (ref 3.87–5.11)
RDW: 13.1 % (ref 11.5–15.5)
WBC: 3.2 10*3/uL — ABNORMAL LOW (ref 4.0–10.5)

## 2014-02-09 LAB — COMPREHENSIVE METABOLIC PANEL
ALT: 11 U/L (ref 0–35)
AST: 19 U/L (ref 0–37)
Albumin: 4.1 g/dL (ref 3.5–5.2)
Alkaline Phosphatase: 85 U/L (ref 39–117)
BILIRUBIN TOTAL: 0.8 mg/dL (ref 0.2–1.2)
BUN: 7 mg/dL (ref 6–23)
CALCIUM: 9.2 mg/dL (ref 8.4–10.5)
CO2: 27 mEq/L (ref 19–32)
Chloride: 107 mEq/L (ref 96–112)
Creatinine, Ser: 1 mg/dL (ref 0.4–1.2)
GFR: 74.1 mL/min (ref >60.00)
GLUCOSE: 76 mg/dL (ref 70–99)
POTASSIUM: 3.1 meq/L — AB (ref 3.5–5.1)
Sodium: 141 mEq/L (ref 135–145)
TOTAL PROTEIN: 7.1 g/dL (ref 6.0–8.3)

## 2014-02-09 LAB — POCT URINALYSIS DIPSTICK
Bilirubin, UA: NEGATIVE
GLUCOSE UA: NEGATIVE
Ketones, UA: NEGATIVE
Leukocytes, UA: NEGATIVE
NITRITE UA: NEGATIVE
PH UA: 7
Protein, UA: NEGATIVE
RBC UA: NEGATIVE
Spec Grav, UA: 1.01
Urobilinogen, UA: 0.2

## 2014-02-09 LAB — TSH: TSH: 0.29 u[IU]/mL — ABNORMAL LOW (ref 0.35–4.50)

## 2014-02-16 ENCOUNTER — Encounter: Payer: Self-pay | Admitting: Family Medicine

## 2014-02-16 ENCOUNTER — Ambulatory Visit (INDEPENDENT_AMBULATORY_CARE_PROVIDER_SITE_OTHER): Payer: BLUE CROSS/BLUE SHIELD | Admitting: Family Medicine

## 2014-02-16 VITALS — BP 120/80 | Temp 97.8°F | Ht 71.5 in | Wt 181.0 lb

## 2014-02-16 DIAGNOSIS — F329 Major depressive disorder, single episode, unspecified: Secondary | ICD-10-CM

## 2014-02-16 DIAGNOSIS — D649 Anemia, unspecified: Secondary | ICD-10-CM

## 2014-02-16 DIAGNOSIS — E039 Hypothyroidism, unspecified: Secondary | ICD-10-CM

## 2014-02-16 DIAGNOSIS — F32A Depression, unspecified: Secondary | ICD-10-CM

## 2014-02-16 MED ORDER — LEVOTHYROXINE SODIUM 125 MCG PO TABS: 125.0000 ug | ORAL_TABLET | Freq: Every day | ORAL | Status: DC

## 2014-02-16 NOTE — Patient Instructions (Signed)
Decrease your Synthroid from 150 g daily to 125 g daily  Nonfasting TSH level and 2 months  Caffeine free diet  Call your folks at [facility] if the insomnia persists after you go off caffeine  Return in one year for general physical exam sooner if any problems

## 2014-02-16 NOTE — Progress Notes (Signed)
   Subjective:    Patient ID: Karen [name], female    DOB: 1960/06/22, 54 y.o.   MRN: [id number]  HPI Karen [name] is a 54 year old female married nonsmoker who comes in today for general physical examination because of a history of depression hypothyroidism and a new prominence sleep dysfunction  [name] and is on Wellbutrin 150 mg daily, Lexapro 20 mg daily, Haldol 1 mg twice a day  She says she feels well she's functioning well but can't sleep at night. She has 2 jobs. 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  She gets routine eye care, dental care, does not do BSE monthly, and you mammography S colonoscopy 2011 normal, vaccinations updated by Apolonio Schneiders   Review of Systems  Constitutional: Negative.   HENT: Negative.   Eyes: Negative.   Respiratory: Negative.   Cardiovascular: Negative.   Gastrointestinal: Negative.   Endocrine: Negative.   Genitourinary: Negative.   Musculoskeletal: Negative.   Skin: Negative.   Allergic/Immunologic: Negative.   Neurological: Negative.   Hematological: Negative.   Psychiatric/Behavioral: Negative.        Objective:   Physical Exam  Constitutional: She is oriented to person, place, and time. She appears well-developed and well-nourished.  HENT:  Head: Normocephalic and atraumatic.  Right Ear: External ear normal.  Left Ear: External ear normal.  Nose: Nose normal.  Mouth/Throat: Oropharynx is clear and moist.  Eyes: EOM are normal. Pupils are equal, round, and reactive to light.  Neck: Normal range of motion. Neck supple. No JVD present. No tracheal deviation present. No thyromegaly present.  Cardiovascular: Normal rate, regular rhythm, normal heart sounds and intact distal pulses.  Exam reveals no gallop and no friction rub.   No murmur heard. Pulmonary/Chest:  Effort normal and breath sounds normal. No stridor. No respiratory distress. She has no wheezes. She has no rales. She exhibits no tenderness.  Abdominal: Soft. Bowel sounds are normal. She exhibits no distension and no mass. There is no tenderness. There is no rebound and no guarding.  Genitourinary:  All her Pap smears have been normal in the past. LMP greater than 5 years ago. Therefore recommend Paps every 3 years.  Bilateral breast exam normal  Musculoskeletal: Normal range of motion.  Lymphadenopathy:    She has no cervical adenopathy.  Neurological: She is alert and oriented to person, place, and time. She has normal reflexes. No cranial nerve deficit. She exhibits normal muscle tone. Coordination normal.  Skin: Skin is warm and dry. No rash noted. No erythema. No pallor.  Psychiatric: She has a normal mood and affect. Her behavior is normal. Judgment and thought content normal.  Nursing note and vitals reviewed.         Assessment & Plan:  Healthy female  History of depression........ continue follow-up with [facility] Insomnia....... caffeine free diet  Hypothyroidism...Marland KitchenMarland KitchenMarland Kitchen TSH level to low decrease Synthroid from 150 g daily to 125 g daily  Anemia unknown etiology........ followed in hematology........ hemoglobin stable 10.2

## 2014-02-16 NOTE — Progress Notes (Signed)
Pre visit review using our clinic review tool, if applicable. No additional management support is needed unless otherwise documented below in the visit note. 

## 2014-02-17 ENCOUNTER — Other Ambulatory Visit: Payer: Self-pay | Admitting: *Deleted

## 2014-02-17 MED ORDER — POTASSIUM CHLORIDE CRYS ER 20 MEQ PO TBCR: 20.0000 meq | EXTENDED_RELEASE_TABLET | Freq: Every day | ORAL | Status: DC

## 2014-03-17 ENCOUNTER — Ambulatory Visit (HOSPITAL_BASED_OUTPATIENT_CLINIC_OR_DEPARTMENT_OTHER): Payer: BLUE CROSS/BLUE SHIELD

## 2014-03-17 ENCOUNTER — Ambulatory Visit (HOSPITAL_BASED_OUTPATIENT_CLINIC_OR_DEPARTMENT_OTHER): Payer: BLUE CROSS/BLUE SHIELD | Admitting: Hematology & Oncology

## 2014-03-17 ENCOUNTER — Encounter: Payer: Self-pay | Admitting: Hematology & Oncology

## 2014-03-17 ENCOUNTER — Other Ambulatory Visit (HOSPITAL_BASED_OUTPATIENT_CLINIC_OR_DEPARTMENT_OTHER): Payer: BLUE CROSS/BLUE SHIELD | Admitting: Lab

## 2014-03-17 VITALS — BP 129/71 | HR 71 | Temp 98.0°F | Resp 16 | Ht 70.0 in | Wt 175.0 lb

## 2014-03-17 DIAGNOSIS — D649 Anemia, unspecified: Secondary | ICD-10-CM

## 2014-03-17 DIAGNOSIS — N289 Disorder of kidney and ureter, unspecified: Secondary | ICD-10-CM

## 2014-03-17 DIAGNOSIS — D631 Anemia in chronic kidney disease: Secondary | ICD-10-CM

## 2014-03-17 DIAGNOSIS — D509 Iron deficiency anemia, unspecified: Secondary | ICD-10-CM

## 2014-03-17 DIAGNOSIS — N183 Chronic kidney disease, stage 3 unspecified: Secondary | ICD-10-CM

## 2014-03-17 DIAGNOSIS — E039 Hypothyroidism, unspecified: Secondary | ICD-10-CM

## 2014-03-17 LAB — CBC WITH DIFFERENTIAL (CANCER CENTER ONLY)
BASO#: 0 10*3/uL (ref 0.0–0.2)
BASO%: 0.3 % (ref 0.0–2.0)
EOS%: 1.5 % (ref 0.0–7.0)
Eosinophils Absolute: 0.1 10*3/uL (ref 0.0–0.5)
HEMATOCRIT: 31.7 % — AB (ref 34.8–46.6)
HGB: 9.8 g/dL — ABNORMAL LOW (ref 11.6–15.9)
LYMPH#: 1.7 10*3/uL (ref 0.9–3.3)
LYMPH%: 42.5 % (ref 14.0–48.0)
MCH: 28.7 pg (ref 26.0–34.0)
MCHC: 30.9 g/dL — AB (ref 32.0–36.0)
MCV: 93 fL (ref 81–101)
MONO#: 0.4 10*3/uL (ref 0.1–0.9)
MONO%: 9.8 % (ref 0.0–13.0)
NEUT#: 1.8 10*3/uL (ref 1.5–6.5)
NEUT%: 45.9 % (ref 39.6–80.0)
Platelets: 187 10*3/uL (ref 145–400)
RBC: 3.42 10*6/uL — ABNORMAL LOW (ref 3.70–5.32)
RDW: 12.2 % (ref 11.1–15.7)
WBC: 4 10*3/uL (ref 3.9–10.0)

## 2014-03-17 LAB — IRON AND TIBC CHCC
%SAT: 37 % (ref 21–57)
IRON: 84 ug/dL (ref 41–142)
TIBC: 229 ug/dL — ABNORMAL LOW (ref 236–444)
UIBC: 145 ug/dL (ref 120–384)

## 2014-03-17 LAB — RETICULOCYTES (CHCC)
ABS RETIC: 28 10*3/uL (ref 19.0–186.0)
RBC.: 3.5 MIL/uL — ABNORMAL LOW (ref 3.87–5.11)
Retic Ct Pct: 0.8 % (ref 0.4–2.3)

## 2014-03-17 LAB — FERRITIN CHCC: Ferritin: 270 ng/ml — ABNORMAL HIGH (ref 9–269)

## 2014-03-17 MED ORDER — DARBEPOETIN ALFA 300 MCG/0.6ML IJ SOSY
300.0000 ug | PREFILLED_SYRINGE | Freq: Once | INTRAMUSCULAR | Status: AC
  Administered 2014-03-17: 300 ug via SUBCUTANEOUS

## 2014-03-17 MED ORDER — DARBEPOETIN ALFA 300 MCG/0.6ML IJ SOSY
PREFILLED_SYRINGE | INTRAMUSCULAR | Status: AC
  Filled 2014-03-17: qty 0.6

## 2014-03-17 NOTE — Progress Notes (Signed)
Hematology and Oncology Follow Up Visit  Karen [name] [id number] [birth year] 54 y.o. 03/17/2014   Principle Diagnosis:   Anemia of renal insufficiency Intermittent iron deficiency anemia   Current Therapy:   Aranesp 300 mcg subcutaneous for hemoglobin less than 10 IV iron as indicated      Interim History:  Karen [name] is back for followup. She is doing pretty well. She got to all the holidays.  She's had no problems with bleeding. She's had no problems with nausea or vomiting. There's been no change in medications.  She's not had Aranesp probably for over 4 months. She has not had any IV iron probably for over a year or so.  She's had no cough. There's been no bleeding. She's had no shortness of breath. She's had no rashes. There's been no leg swelling. Medications:  Current outpatient prescriptions:  .  buPROPion (WELLBUTRIN SR) 150 MG 12 hr tablet, Take 150 mg by mouth every morning. , Disp: , Rfl:  .  DiphenhydrAMINE HCl, Sleep, (NIGHTTIME SLEEP AID PO), Take by mouth. Take one pill on weekends, Disp: , Rfl:  .  escitalopram (LEXAPRO) 20 MG tablet, Take 20 mg by mouth daily.  , Disp: , Rfl:  .  haloperidol (HALDOL) 1 MG tablet, Take 2 mg by mouth every morning. , Disp: , Rfl:  .  haloperidol (HALDOL) 2 MG tablet, Take 2 mg by mouth at bedtime., Disp: , Rfl: 1 .  levothyroxine (SYNTHROID, LEVOTHROID) 125 MCG tablet, Take 1 tablet (125 mcg total) by mouth daily., Disp: 90 tablet, Rfl: 3 .  omeprazole (PRILOSEC) 20 MG capsule, TAKE 1 CAPSULE (20 MG TOTAL) BY MOUTH DAILY., Disp: 90 capsule, Rfl: 1 .  potassium chloride SA (K-DUR,KLOR-CON) 20 MEQ tablet, Take 1 tablet (20 mEq total) by mouth daily., Disp: 30 tablet, Rfl: 11 No current facility-administered medications for this visit.  Facility-Administered Medications Ordered in Other Visits:  .  Darbepoetin Alfa (ARANESP) injection 300 mcg, 300 mcg, Subcutaneous, Once, Volanda Napoleon, MD  Allergies:  Allergies  Allergen  Reactions  . Milk-Related Compounds     Intolerance to milk products-causes gas    Past Medical History, Surgical history, Social history, and Family History were reviewed and updated.  Review of Systems: As above  Physical Exam:  height is 5\' 10"  (1.778 m) and weight is 175 lb (79.379 kg). Her temperature is 98 F (36.7 C). Her blood pressure is 129/71 and her pulse is 71. Her respiration is 16.   Well-developed and well-nourished [name]. There are no ocular or oral lesions. There is no adenopathy in the neck. Lungs are clear. Cardiac exam regular rate and rhythm with no murmurs, rubs or bruits. Abdomen is soft. She has good bowel sounds. There is no fluid wave. There is no palpable liver or spleen. Extremities shows no clubbing cyanosis or edema. She has good range of motion of her joints. She has good muscle strength. Skin no rashes, ecchymoses or petechia. Neurological exam is nonfocal.  Lab Results  Component Value Date   WBC 4.0 03/17/2014   HGB 9.8* 03/17/2014   HCT 31.7* 03/17/2014   MCV 93 03/17/2014   PLT 187 03/17/2014     Chemistry      Component Value Date/Time   NA 141 02/09/2014 0956   NA 138 10/05/2013 0932   K 3.1* 02/09/2014 0956   K 2.9* 10/05/2013 0932   CL 107 02/09/2014 0956   CL 106 10/05/2013 0932   CO2 27  02/09/2014 0956   CO2 26 10/05/2013 0932   BUN 7 02/09/2014 0956   BUN 8 10/05/2013 0932   CREATININE 1.0 02/09/2014 0956   CREATININE 1.1 10/05/2013 0932      Component Value Date/Time   CALCIUM 9.2 02/09/2014 0956   CALCIUM 8.8 10/05/2013 0932   ALKPHOS 85 02/09/2014 0956   ALKPHOS 74 10/05/2013 0932   AST 19 02/09/2014 0956   AST 16 10/05/2013 0932   ALT 11 02/09/2014 0956   ALT 12 10/05/2013 0932   BILITOT 0.8 02/09/2014 0956   BILITOT 0.90 10/05/2013 0932         Impression and Plan: Karen [name] is 54 year old [name]. She has multifactorial anemia. Her hemoglobin is trending downward. We go ahead  and give her a dose of Aranesp. I would not think that she needs any iron.  II think we can probably get her back to see [place] in about 8 weeks.   Volanda Napoleon, MD 2/11/201610:49 AM

## 2014-03-17 NOTE — Patient Instructions (Signed)
Darbepoetin Alfa injection What is this medicine? DARBEPOETIN ALFA (dar be POE e tin AL fa) helps your body make more red blood cells. It is used to treat anemia caused by chronic kidney failure and chemotherapy. This medicine may be used for other purposes; ask your health care provider or pharmacist if you have questions. COMMON BRAND NAME(S): Aranesp What should I tell my health care provider before I take this medicine? They need to know if you have any of these conditions: -blood clotting disorders or history of blood clots -cancer patient not on chemotherapy -cystic fibrosis -heart disease, such as angina, heart failure, or a history of a heart attack -hemoglobin level of 12 g/dL or greater -high blood pressure -low levels of folate, iron, or vitamin B12 -seizures -an unusual or allergic reaction to darbepoetin, erythropoietin, albumin, hamster proteins, latex, other medicines, foods, dyes, or preservatives -pregnant or trying to get pregnant -breast-feeding How should I use this medicine? This medicine is for injection into a vein or under the skin. It is usually given by a health care professional in a hospital or clinic setting. If you get this medicine at home, you will be taught how to prepare and give this medicine. Do not shake the solution before you withdraw a dose. Use exactly as directed. Take your medicine at regular intervals. Do not take your medicine more often than directed. It is important that you put your used needles and syringes in a special sharps container. Do not put them in a trash can. If you do not have a sharps container, call your pharmacist or healthcare provider to get one. Talk to your pediatrician regarding the use of this medicine in children. While this medicine may be used in children as young as 1 year for selected conditions, precautions do apply. Overdosage: If you think you have taken too much of this medicine contact a poison control center or  emergency room at once. NOTE: This medicine is only for you. Do not share this medicine with others. What if I miss a dose? If you miss a dose, take it as soon as you can. If it is almost time for your next dose, take only that dose. Do not take double or extra doses. What may interact with this medicine? Do not take this medicine with any of the following medications: -epoetin alfa This list may not describe all possible interactions. Give your health care provider a list of all the medicines, herbs, non-prescription drugs, or dietary supplements you use. Also tell them if you smoke, drink alcohol, or use illegal drugs. Some items may interact with your medicine. What should I watch for while using this medicine? Visit your prescriber or health care professional for regular checks on your progress and for the needed blood tests and blood pressure measurements. It is especially important for the doctor to make sure your hemoglobin level is in the desired range, to limit the risk of potential side effects and to give you the best benefit. Keep all appointments for any recommended tests. Check your blood pressure as directed. Ask your doctor what your blood pressure should be and when you should contact him or her. As your body makes more red blood cells, you may need to take iron, folic acid, or vitamin B supplements. Ask your doctor or health care provider which products are right for you. If you have kidney disease continue dietary restrictions, even though this medication can make you feel better. Talk with your doctor or health   care professional about the foods you eat and the vitamins that you take. What side effects may I notice from receiving this medicine? Side effects that you should report to your doctor or health care professional as soon as possible: -allergic reactions like skin rash, itching or hives, swelling of the face, lips, or tongue -breathing problems -changes in vision -chest  pain -confusion, trouble speaking or understanding -feeling faint or lightheaded, falls -high blood pressure -muscle aches or pains -pain, swelling, warmth in the leg -rapid weight gain -severe headaches -sudden numbness or weakness of the face, arm or leg -trouble walking, dizziness, loss of balance or coordination -seizures (convulsions) -swelling of the ankles, feet, hands -unusually weak or tired Side effects that usually do not require medical attention (report to your doctor or health care professional if they continue or are bothersome): -diarrhea -fever, chills (flu-like symptoms) -headaches -nausea, vomiting -redness, stinging, or swelling at site where injected This list may not describe all possible side effects. Call your doctor for medical advice about side effects. You may report side effects to FDA at 1-800-FDA-1088. Where should I keep my medicine? Keep out of the reach of children. Store in a refrigerator between 2 and 8 degrees C (36 and 46 degrees F). Do not freeze. Do not shake. Throw away any unused portion if using a single-dose vial. Throw away any unused medicine after the expiration date. NOTE: This sheet is a summary. It may not cover all possible information. If you have questions about this medicine, talk to your doctor, pharmacist, or health care provider.  2015, Elsevier/Gold Standard. (2008-01-05 10:23:57)  

## 2014-03-23 ENCOUNTER — Telehealth: Payer: Self-pay | Admitting: Hematology & Oncology

## 2014-03-23 NOTE — Telephone Encounter (Signed)
[place]: [id number] Valid: 03/17/2014 - 02/04/2015  [id number]   P: [phone] F: [phone]

## 2014-04-21 ENCOUNTER — Other Ambulatory Visit: Payer: Self-pay | Admitting: Family

## 2014-06-16 ENCOUNTER — Other Ambulatory Visit (HOSPITAL_BASED_OUTPATIENT_CLINIC_OR_DEPARTMENT_OTHER): Payer: BLUE CROSS/BLUE SHIELD

## 2014-06-16 ENCOUNTER — Ambulatory Visit: Payer: BLUE CROSS/BLUE SHIELD

## 2014-06-16 ENCOUNTER — Encounter: Payer: Self-pay | Admitting: Family

## 2014-06-16 ENCOUNTER — Ambulatory Visit (HOSPITAL_BASED_OUTPATIENT_CLINIC_OR_DEPARTMENT_OTHER): Payer: BLUE CROSS/BLUE SHIELD | Admitting: Family

## 2014-06-16 VITALS — BP 110/65 | HR 58 | Temp 97.5°F | Resp 14 | Ht 69.0 in | Wt 174.0 lb

## 2014-06-16 DIAGNOSIS — D509 Iron deficiency anemia, unspecified: Secondary | ICD-10-CM

## 2014-06-16 DIAGNOSIS — D6489 Other specified anemias: Secondary | ICD-10-CM | POA: Diagnosis not present

## 2014-06-16 DIAGNOSIS — N289 Disorder of kidney and ureter, unspecified: Secondary | ICD-10-CM

## 2014-06-16 DIAGNOSIS — N183 Chronic kidney disease, stage 3 unspecified: Secondary | ICD-10-CM

## 2014-06-16 DIAGNOSIS — D631 Anemia in chronic kidney disease: Secondary | ICD-10-CM

## 2014-06-16 LAB — CMP (CANCER CENTER ONLY)
ALK PHOS: 72 U/L (ref 26–84)
ALT(SGPT): 13 U/L (ref 10–47)
AST: 19 U/L (ref 11–38)
Albumin: 3.6 g/dL (ref 3.3–5.5)
BUN: 12 mg/dL (ref 7–22)
CO2: 28 mEq/L (ref 18–33)
CREATININE: 1.4 mg/dL — AB (ref 0.6–1.2)
Calcium: 9.3 mg/dL (ref 8.0–10.3)
Chloride: 107 mEq/L (ref 98–108)
Glucose, Bld: 117 mg/dL (ref 73–118)
Potassium: 3.6 mEq/L (ref 3.3–4.7)
Sodium: 139 mEq/L (ref 128–145)
Total Bilirubin: 1.1 mg/dl (ref 0.20–1.60)
Total Protein: 7.5 g/dL (ref 6.4–8.1)

## 2014-06-16 LAB — RETICULOCYTES (CHCC)
ABS Retic: 30.9 10*3/uL (ref 19.0–186.0)
RBC.: 3.43 MIL/uL — ABNORMAL LOW (ref 3.87–5.11)
Retic Ct Pct: 0.9 % (ref 0.4–2.3)

## 2014-06-16 LAB — CBC WITH DIFFERENTIAL (CANCER CENTER ONLY)
BASO#: 0 10*3/uL (ref 0.0–0.2)
BASO%: 0 % (ref 0.0–2.0)
EOS ABS: 0.1 10*3/uL (ref 0.0–0.5)
EOS%: 1.6 % (ref 0.0–7.0)
HEMATOCRIT: 31.6 % — AB (ref 34.8–46.6)
HGB: 10.1 g/dL — ABNORMAL LOW (ref 11.6–15.9)
LYMPH#: 1.6 10*3/uL (ref 0.9–3.3)
LYMPH%: 50.9 % — ABNORMAL HIGH (ref 14.0–48.0)
MCH: 29.8 pg (ref 26.0–34.0)
MCHC: 32 g/dL (ref 32.0–36.0)
MCV: 93 fL (ref 81–101)
MONO#: 0.2 10*3/uL (ref 0.1–0.9)
MONO%: 6.6 % (ref 0.0–13.0)
NEUT#: 1.3 10*3/uL — ABNORMAL LOW (ref 1.5–6.5)
NEUT%: 40.9 % (ref 39.6–80.0)
PLATELETS: 217 10*3/uL (ref 145–400)
RBC: 3.39 10*6/uL — ABNORMAL LOW (ref 3.70–5.32)
RDW: 13.8 % (ref 11.1–15.7)
WBC: 3.2 10*3/uL — AB (ref 3.9–10.0)

## 2014-06-16 NOTE — Progress Notes (Signed)
Hematology and Oncology Follow Up Visit  Karen [name] [id number] [birth year] 54 y.o. 06/16/2014   Principle Diagnosis:  Anemia of renal insufficiency Iron deficiency anemia  Current Therapy:   Aranesp 300 mcg subcutaneous for hemoglobin less than 10 IV iron as indicated    Interim History:  Karen [name] is here today for a follow-up. She did receive an Aranesp injection during Karen [name] last visit. Karen [name] Hgb is now 10.1. She denies fatigue. Karen [name] iron saturation in February was 37% and ferritin was 270.  No chills, rash, ice cravings, dizziness, SOB, chest pain, palpitations, abdominal pain, constipation, diarrhea, blood in urine or stool.  No swelling, tenderness, numbness or tingling in Karen [name] extremities. No new aches or pains.  Karen [name] appetite is good and she is staying hydrated. Karen [name] weight is stable.  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   Medications:    Medication List       This list is accurate as of: 06/16/14 10:42 AM.  Always use your most recent med list.               buPROPion 150 MG 12 hr tablet  Commonly known as:  WELLBUTRIN SR  Take 150 mg by mouth every morning.     escitalopram 20 MG tablet  Commonly known as:  LEXAPRO  Take 20 mg by mouth daily.     haloperidol 1 MG tablet  Commonly known as:  HALDOL  Take 2 mg by mouth every morning.     levothyroxine 125 MCG tablet  Commonly known as:  SYNTHROID, LEVOTHROID  Take 1 tablet (125 mcg total) by mouth daily.     NIGHTTIME SLEEP AID PO  Take by mouth. Take one pill on weekends     omeprazole 20 MG capsule  Commonly known as:  PRILOSEC  TAKE 1 CAPSULE (20 MG TOTAL) BY MOUTH DAILY.     zolpidem 10 MG tablet  Commonly known as:  AMBIEN  Take 10 mg by mouth at bedtime.        Allergies:  Allergies  Allergen Reactions  . Milk-Related Compounds     Intolerance to milk products-causes gas    Past Medical History, Surgical history, Social history, and Family History were reviewed  and updated.  Review of Systems: All other 10 point review of systems is negative.   Physical Exam:  height is 5\' 9"  (1.753 m) and weight is 174 lb (78.926 kg). Karen [name] oral temperature is 97.5 F (36.4 C). Karen [name] blood pressure is 110/65 and Karen [name] pulse is 58. Karen [name] respiration is 14.   Wt Readings from Last 3 Encounters:  06/16/14 174 lb (78.926 kg)  03/17/14 175 lb (79.379 kg)  02/16/14 181 lb (82.101 kg)    Ocular: Sclerae unicteric, pupils equal, round and reactive to light Ear-nose-throat: Oropharynx clear, dentition fair Lymphatic: No cervical or supraclavicular adenopathy Lungs no rales or rhonchi, good excursion bilaterally Heart regular rate and rhythm, no murmur appreciated Abd soft, nontender, positive bowel sounds MSK no focal spinal tenderness, no joint edema Neuro: non-focal, well-oriented, appropriate affect Breasts: Deferred  Lab Results  Component Value Date   WBC 3.2* 06/16/2014   HGB 10.1* 06/16/2014   HCT 31.6* 06/16/2014   MCV 93 06/16/2014   PLT 217 06/16/2014   Lab Results  Component Value Date   FERRITIN 270* 03/17/2014   IRON 84 03/17/2014   TIBC 229* 03/17/2014   UIBC 145 03/17/2014   IRONPCTSAT 37 03/17/2014   Lab Results  Component Value Date   RETICCTPCT 0.8 03/17/2014   RBC 3.39* 06/16/2014   RETICCTABS 28.0 03/17/2014   No results found for: KPAFRELGTCHN, LAMBDASER, KAPLAMBRATIO No results found for: Kandis Cocking, IGMSERUM No results found for: Odetta Pink, SPEI   Chemistry      Component Value Date/Time   NA 139 06/16/2014 0954   NA 141 02/09/2014 0956   K 3.6 06/16/2014 0954   K 3.1* 02/09/2014 0956   CL 107 06/16/2014 0954   CL 107 02/09/2014 0956   CO2 28 06/16/2014 0954   CO2 27 02/09/2014 0956   BUN 12 06/16/2014 0954   BUN 7 02/09/2014 0956   CREATININE 1.4* 06/16/2014 0954   CREATININE 1.0 02/09/2014 0956      Component Value Date/Time   CALCIUM 9.3 06/16/2014 0954     CALCIUM 9.2 02/09/2014 0956   ALKPHOS 72 06/16/2014 0954   ALKPHOS 85 02/09/2014 0956   AST 19 06/16/2014 0954   AST 19 02/09/2014 0956   ALT 13 06/16/2014 0954   ALT 11 02/09/2014 0956   BILITOT 1.10 06/16/2014 0954   BILITOT 0.8 02/09/2014 0956     Impression and Plan: Karen [name] is 54 year old [name] with multifactorial anemia (iron deficiency/renal insufficiency). She did receive Aranesp during Karen [name] last visit and Karen [name] Hgb is up to 10.1. She is doing well and is asymptomatic at this time.  She will not need Aranesp today.   We will see what Karen [name] iron studies show.  We will see Karen [name] back in 2 months for labs and follow-up.  She will contact us with any questions or concerns. We can certainly see Karen [name] sooner if need be.   Eliezer Bottom, NP 5/12/201610:42 AM

## 2014-06-17 LAB — FERRITIN CHCC: Ferritin: 232 ng/ml (ref 9–269)

## 2014-06-17 LAB — IRON AND TIBC CHCC
%SAT: 33 % (ref 21–57)
Iron: 82 ug/dL (ref 41–142)
TIBC: 253 ug/dL (ref 236–444)
UIBC: 170 ug/dL (ref 120–384)

## 2014-07-08 ENCOUNTER — Emergency Department (HOSPITAL_COMMUNITY)
Admission: EM | Admit: 2014-07-08 | Discharge: 2014-07-08 | Disposition: A | Discharge status: Home / Self Care | Payer: BLUE CROSS/BLUE SHIELD | Attending: Emergency Medicine | Admitting: Emergency Medicine

## 2014-07-08 ENCOUNTER — Encounter (HOSPITAL_COMMUNITY): Payer: Self-pay | Admitting: Emergency Medicine

## 2014-07-08 DIAGNOSIS — M79645 Pain in left finger(s): Secondary | ICD-10-CM

## 2014-07-08 DIAGNOSIS — K219 Gastro-esophageal reflux disease without esophagitis: Secondary | ICD-10-CM | POA: Insufficient documentation

## 2014-07-08 DIAGNOSIS — Z8742 Personal history of other diseases of the female genital tract: Secondary | ICD-10-CM | POA: Diagnosis not present

## 2014-07-08 DIAGNOSIS — Z79899 Other long term (current) drug therapy: Secondary | ICD-10-CM | POA: Diagnosis not present

## 2014-07-08 DIAGNOSIS — Z862 Personal history of diseases of the blood and blood-forming organs and certain disorders involving the immune mechanism: Secondary | ICD-10-CM | POA: Insufficient documentation

## 2014-07-08 DIAGNOSIS — Y9389 Activity, other specified: Secondary | ICD-10-CM | POA: Insufficient documentation

## 2014-07-08 DIAGNOSIS — F329 Major depressive disorder, single episode, unspecified: Secondary | ICD-10-CM | POA: Diagnosis not present

## 2014-07-08 DIAGNOSIS — Y998 Other external cause status: Secondary | ICD-10-CM | POA: Insufficient documentation

## 2014-07-08 DIAGNOSIS — S6992XA Unspecified injury of left wrist, hand and finger(s), initial encounter: Secondary | ICD-10-CM | POA: Diagnosis not present

## 2014-07-08 DIAGNOSIS — E039 Hypothyroidism, unspecified: Secondary | ICD-10-CM | POA: Insufficient documentation

## 2014-07-08 DIAGNOSIS — Y92410 Unspecified street and highway as the place of occurrence of the external cause: Secondary | ICD-10-CM | POA: Insufficient documentation

## 2014-07-08 DIAGNOSIS — Z87891 Personal history of nicotine dependence: Secondary | ICD-10-CM | POA: Insufficient documentation
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
## 2014-07-08 NOTE — Discharge Instructions (Signed)
Please monitor for new or worsening signs or symptoms, follow-up immediately if any present. Please contact her primary care provider inform him of today's visit and all relevant data. His follow-up with him in 1 week if symptoms persist.

## 2014-07-19 ENCOUNTER — Other Ambulatory Visit: Payer: Self-pay | Admitting: Family Medicine

## 2014-08-16 ENCOUNTER — Encounter: Payer: Self-pay | Admitting: Hematology & Oncology

## 2014-08-16 ENCOUNTER — Ambulatory Visit (HOSPITAL_BASED_OUTPATIENT_CLINIC_OR_DEPARTMENT_OTHER): Payer: BLUE CROSS/BLUE SHIELD | Admitting: Hematology & Oncology

## 2014-08-16 ENCOUNTER — Other Ambulatory Visit (HOSPITAL_BASED_OUTPATIENT_CLINIC_OR_DEPARTMENT_OTHER): Payer: BLUE CROSS/BLUE SHIELD

## 2014-08-16 ENCOUNTER — Ambulatory Visit: Payer: BLUE CROSS/BLUE SHIELD

## 2014-08-16 VITALS — BP 125/72 | HR 60 | Temp 97.8°F | Wt 178.0 lb

## 2014-08-16 DIAGNOSIS — D6489 Other specified anemias: Secondary | ICD-10-CM

## 2014-08-16 DIAGNOSIS — D509 Iron deficiency anemia, unspecified: Secondary | ICD-10-CM

## 2014-08-16 LAB — RETICULOCYTES (CHCC)
ABS RETIC: 46.5 10*3/uL (ref 19.0–186.0)
RBC.: 3.32 MIL/uL — ABNORMAL LOW (ref 3.87–5.11)
RETIC CT PCT: 1.4 % (ref 0.4–2.3)

## 2014-08-16 LAB — COMPREHENSIVE METABOLIC PANEL
ALT: 9 U/L (ref 0–35)
AST: 14 U/L (ref 0–37)
Albumin: 3.7 g/dL (ref 3.5–5.2)
Alkaline Phosphatase: 69 U/L (ref 39–117)
BUN: 17 mg/dL (ref 6–23)
CALCIUM: 8.8 mg/dL (ref 8.4–10.5)
CO2: 26 mEq/L (ref 19–32)
CREATININE: 1.18 mg/dL — AB (ref 0.50–1.10)
Chloride: 108 mEq/L (ref 96–112)
Glucose, Bld: 67 mg/dL — ABNORMAL LOW (ref 70–99)
POTASSIUM: 3.6 meq/L (ref 3.5–5.3)
Sodium: 143 mEq/L (ref 135–145)
Total Bilirubin: 0.5 mg/dL (ref 0.2–1.2)
Total Protein: 6.3 g/dL (ref 6.0–8.3)

## 2014-08-16 LAB — CBC WITH DIFFERENTIAL (CANCER CENTER ONLY)
BASO#: 0 10*3/uL (ref 0.0–0.2)
BASO%: 0.3 % (ref 0.0–2.0)
EOS%: 1.2 % (ref 0.0–7.0)
Eosinophils Absolute: 0 10*3/uL (ref 0.0–0.5)
HEMATOCRIT: 31.5 % — AB (ref 34.8–46.6)
HGB: 10 g/dL — ABNORMAL LOW (ref 11.6–15.9)
LYMPH#: 1.4 10*3/uL (ref 0.9–3.3)
LYMPH%: 41.1 % (ref 14.0–48.0)
MCH: 30.4 pg (ref 26.0–34.0)
MCHC: 31.7 g/dL — ABNORMAL LOW (ref 32.0–36.0)
MCV: 96 fL (ref 81–101)
MONO#: 0.3 10*3/uL (ref 0.1–0.9)
MONO%: 10 % (ref 0.0–13.0)
NEUT%: 47.4 % (ref 39.6–80.0)
NEUTROS ABS: 1.6 10*3/uL (ref 1.5–6.5)
PLATELETS: 190 10*3/uL (ref 145–400)
RBC: 3.29 10*6/uL — ABNORMAL LOW (ref 3.70–5.32)
RDW: 12.3 % (ref 11.1–15.7)
WBC: 3.3 10*3/uL — ABNORMAL LOW (ref 3.9–10.0)

## 2014-08-16 LAB — IRON AND TIBC CHCC
%SAT: 25 % (ref 21–57)
IRON: 59 ug/dL (ref 41–142)
TIBC: 234 ug/dL — AB (ref 236–444)
UIBC: 175 ug/dL (ref 120–384)

## 2014-08-16 LAB — FERRITIN CHCC: FERRITIN: 153 ng/mL (ref 9–269)

## 2014-08-16 NOTE — Progress Notes (Signed)
Hematology and Oncology Follow Up Visit  Karen [name] [id number] [birth year] 54 y.o. 08/16/2014   Principle Diagnosis:   Anemia of renal insufficiency        Intermittent iron deficiency anemia   Current Therapy:   Aranesp 300 mcg subcutaneous for hemoglobin less than 10 IV iron as indicated      Interim History:  Karen [name] is back for followup. She really looks good. She has a new job. She restarted this soon.  She's had not required any iron or Aranesp for several months.  When last checked her levels back in May, her ferritin was 232 with an iron saturation of 33%.  She's had no problem with infections. She's had no issues with cough. She's had no change in bowel or bladder habits. She's had no cough. There's been no bleeding. She's had no shortness of breath.  Her mammogram was done back in a November.   Overall, her performance status is ECOG 1.     Medications:  Current outpatient prescriptions:  .  buPROPion (WELLBUTRIN SR) 150 MG 12 hr tablet, Take 150 mg by mouth every morning. , Disp: , Rfl:  .  DiphenhydrAMINE HCl, Sleep, (NIGHTTIME SLEEP AID PO), Take by mouth. Take one pill on weekends, Disp: , Rfl:  .  escitalopram (LEXAPRO) 20 MG tablet, Take 20 mg by mouth daily.  , Disp: , Rfl:  .  haloperidol (HALDOL) 1 MG tablet, Take 2 mg by mouth every morning. , Disp: , Rfl:  .  levothyroxine (SYNTHROID, LEVOTHROID) 125 MCG tablet, Take 1 tablet (125 mcg total) by mouth daily., Disp: 90 tablet, Rfl: 3 .  omeprazole (PRILOSEC) 20 MG capsule, TAKE 1 CAPSULE (20 MG TOTAL) BY MOUTH DAILY., Disp: 90 capsule, Rfl: 3 .  zolpidem (AMBIEN) 10 MG tablet, Take 10 mg by mouth at bedtime., Disp: , Rfl: 5  Allergies:  Allergies  Allergen Reactions  . Milk-Related Compounds     Intolerance to milk products-causes gas    Past Medical History, Surgical history, Social history, and Family History were reviewed and updated.  Review of Systems: As above  Physical Exam:  weight is 178 lb (80.74 kg). Her oral temperature is 97.8 F (36.6 C). Her blood pressure is 125/72 and her pulse is 60.   Well-developed and well-nourished [name]. There are no ocular or oral lesions. There is no adenopathy in the neck. Lungs are clear. Cardiac exam regular rate and rhythm with no murmurs, rubs or bruits. Abdomen is soft. She has good bowel sounds. There is no fluid wave. There is no palpable liver or spleen. Extremities shows no clubbing cyanosis or edema. She has good range of motion of her joints. She has good muscle strength. Skin no rashes, ecchymoses or petechia. Neurological exam is nonfocal.  Lab Results  Component Value Date   WBC 3.3* 08/16/2014   HGB 10.0* 08/16/2014   HCT 31.5* 08/16/2014   MCV 96 08/16/2014   PLT 190 08/16/2014     Chemistry      Component Value Date/Time   NA 139 06/16/2014 0954   NA 141 02/09/2014 0956   K 3.6 06/16/2014 0954   K 3.1* 02/09/2014 0956   CL 107 06/16/2014 0954   CL 107 02/09/2014 0956   CO2 28 06/16/2014 0954   CO2 27 02/09/2014 0956   BUN 12 06/16/2014 0954   BUN 7 02/09/2014 0956   CREATININE 1.4* 06/16/2014 0954   CREATININE 1.0 02/09/2014 4332  Component Value Date/Time   CALCIUM 9.3 06/16/2014 0954   CALCIUM 9.2 02/09/2014 0956   ALKPHOS 72 06/16/2014 0954   ALKPHOS 85 02/09/2014 0956   AST 19 06/16/2014 0954   AST 19 02/09/2014 0956   ALT 13 06/16/2014 0954   ALT 11 02/09/2014 0956   BILITOT 1.10 06/16/2014 0954   BILITOT 0.8 02/09/2014 0956         Impression and Plan: Karen [name] is 54 year old [name]. She has multifactorial anemia. Everything looks very stable. I looked at  her blood smear. This looked fine. I do not see any immature myeloid or lymphoid forms.  She does not eat any iron or Aranesp.  II think we can probably get her back to see [place] in about 3 months.     Raynald Kemp, MD 7/12/201610:16 AM

## 2014-08-22 ENCOUNTER — Ambulatory Visit (INDEPENDENT_AMBULATORY_CARE_PROVIDER_SITE_OTHER): Payer: BLUE CROSS/BLUE SHIELD | Admitting: *Deleted

## 2014-08-22 ENCOUNTER — Ambulatory Visit: Payer: BLUE CROSS/BLUE SHIELD | Admitting: Family Medicine

## 2014-08-22 DIAGNOSIS — Z23 Encounter for immunization: Secondary | ICD-10-CM

## 2014-08-24 LAB — TB SKIN TEST
Induration: 0 mm
TB Skin Test: NEGATIVE

## 2014-11-18 ENCOUNTER — Ambulatory Visit (HOSPITAL_BASED_OUTPATIENT_CLINIC_OR_DEPARTMENT_OTHER): Payer: BLUE CROSS/BLUE SHIELD

## 2014-11-18 ENCOUNTER — Ambulatory Visit (HOSPITAL_BASED_OUTPATIENT_CLINIC_OR_DEPARTMENT_OTHER): Payer: BLUE CROSS/BLUE SHIELD | Admitting: Family

## 2014-11-18 ENCOUNTER — Other Ambulatory Visit (HOSPITAL_BASED_OUTPATIENT_CLINIC_OR_DEPARTMENT_OTHER): Payer: BLUE CROSS/BLUE SHIELD

## 2014-11-18 ENCOUNTER — Encounter: Payer: Self-pay | Admitting: Family

## 2014-11-18 VITALS — BP 116/59 | HR 60 | Temp 97.9°F | Resp 14 | Ht 69.0 in | Wt 170.0 lb

## 2014-11-18 DIAGNOSIS — N289 Disorder of kidney and ureter, unspecified: Secondary | ICD-10-CM

## 2014-11-18 DIAGNOSIS — D509 Iron deficiency anemia, unspecified: Secondary | ICD-10-CM

## 2014-11-18 DIAGNOSIS — D649 Anemia, unspecified: Secondary | ICD-10-CM

## 2014-11-18 LAB — RETICULOCYTES (CHCC)
ABS Retic: 26.4 10*3/uL (ref 19.0–186.0)
RBC.: 3.3 MIL/uL — ABNORMAL LOW (ref 3.87–5.11)
RETIC CT PCT: 0.8 % (ref 0.4–2.3)

## 2014-11-18 LAB — CBC WITH DIFFERENTIAL (CANCER CENTER ONLY)
BASO#: 0 10*3/uL (ref 0.0–0.2)
BASO%: 0.3 % (ref 0.0–2.0)
EOS%: 0.3 % (ref 0.0–7.0)
Eosinophils Absolute: 0 10*3/uL (ref 0.0–0.5)
HEMATOCRIT: 31.2 % — AB (ref 34.8–46.6)
HGB: 9.8 g/dL — ABNORMAL LOW (ref 11.6–15.9)
LYMPH#: 1.2 10*3/uL (ref 0.9–3.3)
LYMPH%: 35.5 % (ref 14.0–48.0)
MCH: 29.6 pg (ref 26.0–34.0)
MCHC: 31.4 g/dL — ABNORMAL LOW (ref 32.0–36.0)
MCV: 94 fL (ref 81–101)
MONO#: 0.3 10*3/uL (ref 0.1–0.9)
MONO%: 7.6 % (ref 0.0–13.0)
NEUT%: 56.3 % (ref 39.6–80.0)
NEUTROS ABS: 1.9 10*3/uL (ref 1.5–6.5)
Platelets: 185 10*3/uL (ref 145–400)
RBC: 3.31 10*6/uL — ABNORMAL LOW (ref 3.70–5.32)
RDW: 12.5 % (ref 11.1–15.7)
WBC: 3.4 10*3/uL — ABNORMAL LOW (ref 3.9–10.0)

## 2014-11-18 LAB — COMPREHENSIVE METABOLIC PANEL
ALBUMIN: 4 g/dL (ref 3.6–5.1)
ALT: 6 U/L (ref 6–29)
AST: 14 U/L (ref 10–35)
Alkaline Phosphatase: 67 U/L (ref 33–130)
BUN: 10 mg/dL (ref 7–25)
CALCIUM: 9.6 mg/dL (ref 8.6–10.4)
CHLORIDE: 106 mmol/L (ref 98–110)
CO2: 26 mmol/L (ref 20–31)
CREATININE: 1.11 mg/dL — AB (ref 0.50–1.05)
Glucose, Bld: 85 mg/dL (ref 65–99)
POTASSIUM: 3.7 mmol/L (ref 3.5–5.3)
Sodium: 142 mmol/L (ref 135–146)
TOTAL PROTEIN: 7.1 g/dL (ref 6.1–8.1)
Total Bilirubin: 0.7 mg/dL (ref 0.2–1.2)

## 2014-11-18 MED ORDER — DARBEPOETIN ALFA 300 MCG/0.6ML IJ SOSY
PREFILLED_SYRINGE | INTRAMUSCULAR | Status: AC
  Filled 2014-11-18: qty 0.6

## 2014-11-18 MED ORDER — DARBEPOETIN ALFA 300 MCG/0.6ML IJ SOSY
300.0000 ug | PREFILLED_SYRINGE | Freq: Once | INTRAMUSCULAR | Status: AC
  Administered 2014-11-18: 300 ug via SUBCUTANEOUS

## 2014-11-18 NOTE — Patient Instructions (Signed)
Darbepoetin Alfa injection What is this medicine? DARBEPOETIN ALFA (dar be POE e tin AL fa) helps your body make more red blood cells. It is used to treat anemia caused by chronic kidney failure and chemotherapy. This medicine may be used for other purposes; ask your health care provider or pharmacist if you have questions. COMMON BRAND NAME(S): Aranesp What should I tell my health care provider before I take this medicine? They need to know if you have any of these conditions: -blood clotting disorders or history of blood clots -cancer patient not on chemotherapy -cystic fibrosis -heart disease, such as angina, heart failure, or a history of a heart attack -hemoglobin level of 12 g/dL or greater -high blood pressure -low levels of folate, iron, or vitamin B12 -seizures -an unusual or allergic reaction to darbepoetin, erythropoietin, albumin, hamster proteins, latex, other medicines, foods, dyes, or preservatives -pregnant or trying to get pregnant -breast-feeding How should I use this medicine? This medicine is for injection into a vein or under the skin. It is usually given by a health care professional in a hospital or clinic setting. If you get this medicine at home, you will be taught how to prepare and give this medicine. Do not shake the solution before you withdraw a dose. Use exactly as directed. Take your medicine at regular intervals. Do not take your medicine more often than directed. It is important that you put your used needles and syringes in a special sharps container. Do not put them in a trash can. If you do not have a sharps container, call your pharmacist or healthcare provider to get one. Talk to your pediatrician regarding the use of this medicine in children. While this medicine may be used in children as young as 1 year for selected conditions, precautions do apply. Overdosage: If you think you have taken too much of this medicine contact a poison control center or  emergency room at once. NOTE: This medicine is only for you. Do not share this medicine with others. What if I miss a dose? If you miss a dose, take it as soon as you can. If it is almost time for your next dose, take only that dose. Do not take double or extra doses. What may interact with this medicine? Do not take this medicine with any of the following medications: -epoetin alfa This list may not describe all possible interactions. Give your health care provider a list of all the medicines, herbs, non-prescription drugs, or dietary supplements you use. Also tell them if you smoke, drink alcohol, or use illegal drugs. Some items may interact with your medicine. What should I watch for while using this medicine? Visit your prescriber or health care professional for regular checks on your progress and for the needed blood tests and blood pressure measurements. It is especially important for the doctor to make sure your hemoglobin level is in the desired range, to limit the risk of potential side effects and to give you the best benefit. Keep all appointments for any recommended tests. Check your blood pressure as directed. Ask your doctor what your blood pressure should be and when you should contact him or her. As your body makes more red blood cells, you may need to take iron, folic acid, or vitamin B supplements. Ask your doctor or health care provider which products are right for you. If you have kidney disease continue dietary restrictions, even though this medication can make you feel better. Talk with your doctor or health   care professional about the foods you eat and the vitamins that you take. What side effects may I notice from receiving this medicine? Side effects that you should report to your doctor or health care professional as soon as possible: -allergic reactions like skin rash, itching or hives, swelling of the face, lips, or tongue -breathing problems -changes in vision -chest  pain -confusion, trouble speaking or understanding -feeling faint or lightheaded, falls -high blood pressure -muscle aches or pains -pain, swelling, warmth in the leg -rapid weight gain -severe headaches -sudden numbness or weakness of the face, arm or leg -trouble walking, dizziness, loss of balance or coordination -seizures (convulsions) -swelling of the ankles, feet, hands -unusually weak or tired Side effects that usually do not require medical attention (report to your doctor or health care professional if they continue or are bothersome): -diarrhea -fever, chills (flu-like symptoms) -headaches -nausea, vomiting -redness, stinging, or swelling at site where injected This list may not describe all possible side effects. Call your doctor for medical advice about side effects. You may report side effects to FDA at 1-800-FDA-1088. Where should I keep my medicine? Keep out of the reach of children. Store in a refrigerator between 2 and 8 degrees C (36 and 46 degrees F). Do not freeze. Do not shake. Throw away any unused portion if using a single-dose vial. Throw away any unused medicine after the expiration date. NOTE: This sheet is a summary. It may not cover all possible information. If you have questions about this medicine, talk to your doctor, pharmacist, or health care provider.  2015, Elsevier/Gold Standard. (2008-01-05 10:23:57)  

## 2014-11-18 NOTE — Progress Notes (Signed)
Hematology and Oncology Follow Up Visit  Karen [name] [id number] [birth year] 54 y.o. 11/18/2014   Principle Diagnosis:  Anemia of renal insufficiency Iron deficiency anemia  Current Therapy:   Aranesp 300 mcg subcutaneous for hemoglobin less than 10 IV iron as indicated    Interim History:  Karen [name] is here today for a follow-up. She is doing well and denies fatigue at this time. Her Hgb today is down at 9.8. In July her iron saturation was 25 with a ferritin of 153.  Her last dose of Aranesp was in February.  No fever, chills, n/v, rash, ice cravings, dizziness, SOB, chest pain, palpitations, abdominal pain or changes in bowel or bladder habits.  She has a good appetite and is staying hydrated. Her weight is down 8 lbs to 170 since her last visit in July.  No episodes of bleeding or bruising.  No swelling, tenderness, numbness or tingling in her extremities. She has changed jobs and is now working in [employer] which she says is much less stressful.   Medications:    Medication List       This list is accurate as of: 11/18/14  3:48 PM.  Always use your most recent med list.               buPROPion 150 MG 12 hr tablet  Commonly known as:  WELLBUTRIN SR  Take 150 mg by mouth every morning.     escitalopram 20 MG tablet  Commonly known as:  LEXAPRO  Take 20 mg by mouth daily.     haloperidol 1 MG tablet  Commonly known as:  HALDOL  Take 2 mg by mouth every morning.     levothyroxine 125 MCG tablet  Commonly known as:  SYNTHROID, LEVOTHROID  Take 1 tablet (125 mcg total) by mouth daily.     NIGHTTIME SLEEP AID PO  Take by mouth. Take one pill on weekends     omeprazole 20 MG capsule  Commonly known as:  PRILOSEC  TAKE 1 CAPSULE (20 MG TOTAL) BY MOUTH DAILY.     zolpidem 10 MG tablet  Commonly known as:  AMBIEN  Take 10 mg by mouth at bedtime.        Allergies:  Allergies  Allergen Reactions  . Milk-Related Compounds     Intolerance to milk  products-causes gas    Past Medical History, Surgical history, Social history, and Family History were reviewed and updated.  Review of Systems: All other 10 point review of systems is negative.   Physical Exam:  vitals were not taken for this visit.  Wt Readings from Last 3 Encounters:  08/16/14 178 lb (80.74 kg)  06/16/14 174 lb (78.926 kg)  03/17/14 175 lb (79.379 kg)    Ocular: Sclerae unicteric, pupils equal, round and reactive to light Ear-nose-throat: Oropharynx clear, dentition fair Lymphatic: No cervical or supraclavicular adenopathy Lungs no rales or rhonchi, good excursion bilaterally Heart regular rate and rhythm, no murmur appreciated Abd soft, nontender, positive bowel sounds MSK no focal spinal tenderness, no joint edema Neuro: non-focal, well-oriented, appropriate affect Breasts: Deferred  Lab Results  Component Value Date   WBC 3.4* 11/18/2014   HGB 9.8* 11/18/2014   HCT 31.2* 11/18/2014   MCV 94 11/18/2014   PLT 185 11/18/2014   Lab Results  Component Value Date   FERRITIN 153 08/16/2014   IRON 59 08/16/2014   TIBC 234* 08/16/2014   UIBC 175 08/16/2014   IRONPCTSAT 25 08/16/2014   Lab Results  Component Value Date   RETICCTPCT 1.4 08/16/2014   RBC 3.31* 11/18/2014   RETICCTABS 46.5 08/16/2014   No results found for: KPAFRELGTCHN, LAMBDASER, KAPLAMBRATIO No results found for: IGGSERUM, IGA, IGMSERUM No results found for: Odetta Pink, SPEI   Chemistry      Component Value Date/Time   NA 143 08/16/2014 0932   NA 139 06/16/2014 0954   K 3.6 08/16/2014 0932   K 3.6 06/16/2014 0954   CL 108 08/16/2014 0932   CL 107 06/16/2014 0954   CO2 26 08/16/2014 0932   CO2 28 06/16/2014 0954   BUN 17 08/16/2014 0932   BUN 12 06/16/2014 0954   CREATININE 1.18* 08/16/2014 0932   CREATININE 1.4* 06/16/2014 0954      Component Value Date/Time   CALCIUM 8.8 08/16/2014 0932   CALCIUM 9.3 06/16/2014  0954   ALKPHOS 69 08/16/2014 0932   ALKPHOS 72 06/16/2014 0954   AST 14 08/16/2014 0932   AST 19 06/16/2014 0954   ALT 9 08/16/2014 0932   ALT 13 06/16/2014 0954   BILITOT 0.5 08/16/2014 0932   BILITOT 1.10 06/16/2014 0954     Impression and Plan: Karen [name] is 54 year old [name] with multifactorial anemia (iron deficiency/renal insufficiency). She is asymptomatic at this time.  Her Hgb is down today at 9.8 so we will go ahead and give her Aranesp today.  We will see what her iron studies show.  We will see her back in 3 months for labs and follow-up.  She will contact us with any questions or concerns. We can certainly see her sooner if need be.   Eliezer Bottom, NP 10/14/20163:48 PM

## 2014-11-21 LAB — IRON AND TIBC CHCC
%SAT: 24 % (ref 21–57)
IRON: 57 ug/dL (ref 41–142)
TIBC: 238 ug/dL (ref 236–444)
UIBC: 180 ug/dL (ref 120–384)

## 2014-11-21 LAB — FERRITIN CHCC: Ferritin: 215 ng/ml (ref 9–269)

## 2015-01-09 ENCOUNTER — Other Ambulatory Visit: Payer: Self-pay

## 2015-01-09 DIAGNOSIS — Z1231 Encounter for screening mammogram for malignant neoplasm of breast: Secondary | ICD-10-CM

## 2015-02-01 ENCOUNTER — Ambulatory Visit
Admission: RE | Admit: 2015-02-01 | Discharge: 2015-02-01 | Disposition: A | Discharge status: Home / Self Care | Payer: BLUE CROSS/BLUE SHIELD | Source: Ambulatory Visit

## 2015-02-01 DIAGNOSIS — Z1231 Encounter for screening mammogram for malignant neoplasm of breast: Secondary | ICD-10-CM

## 2015-02-20 ENCOUNTER — Other Ambulatory Visit (HOSPITAL_BASED_OUTPATIENT_CLINIC_OR_DEPARTMENT_OTHER): Payer: BLUE CROSS/BLUE SHIELD

## 2015-02-20 ENCOUNTER — Ambulatory Visit (HOSPITAL_BASED_OUTPATIENT_CLINIC_OR_DEPARTMENT_OTHER): Payer: BLUE CROSS/BLUE SHIELD | Admitting: Family

## 2015-02-20 ENCOUNTER — Encounter: Payer: Self-pay | Admitting: Family

## 2015-02-20 VITALS — BP 107/71 | HR 65 | Temp 98.1°F | Resp 16 | Ht 69.0 in | Wt 176.0 lb

## 2015-02-20 DIAGNOSIS — D509 Iron deficiency anemia, unspecified: Secondary | ICD-10-CM

## 2015-02-20 DIAGNOSIS — D649 Anemia, unspecified: Secondary | ICD-10-CM | POA: Diagnosis not present

## 2015-02-20 DIAGNOSIS — N189 Chronic kidney disease, unspecified: Secondary | ICD-10-CM

## 2015-02-20 DIAGNOSIS — N289 Disorder of kidney and ureter, unspecified: Secondary | ICD-10-CM

## 2015-02-20 DIAGNOSIS — D631 Anemia in chronic kidney disease: Secondary | ICD-10-CM

## 2015-02-20 LAB — CBC WITH DIFFERENTIAL (CANCER CENTER ONLY)
BASO#: 0 10*3/uL (ref 0.0–0.2)
BASO%: 0.3 % (ref 0.0–2.0)
EOS%: 0.6 % (ref 0.0–7.0)
Eosinophils Absolute: 0 10*3/uL (ref 0.0–0.5)
HCT: 32.2 % — ABNORMAL LOW (ref 34.8–46.6)
HEMOGLOBIN: 10.1 g/dL — AB (ref 11.6–15.9)
LYMPH#: 1.1 10*3/uL (ref 0.9–3.3)
LYMPH%: 34.4 % (ref 14.0–48.0)
MCH: 28.8 pg (ref 26.0–34.0)
MCHC: 31.4 g/dL — AB (ref 32.0–36.0)
MCV: 92 fL (ref 81–101)
MONO#: 0.2 10*3/uL (ref 0.1–0.9)
MONO%: 6.9 % (ref 0.0–13.0)
NEUT#: 1.8 10*3/uL (ref 1.5–6.5)
NEUT%: 57.8 % (ref 39.6–80.0)
Platelets: 163 10*3/uL (ref 145–400)
RBC: 3.51 10*6/uL — ABNORMAL LOW (ref 3.70–5.32)
RDW: 13.8 % (ref 11.1–15.7)
WBC: 3.2 10*3/uL — ABNORMAL LOW (ref 3.9–10.0)

## 2015-02-20 NOTE — Progress Notes (Signed)
Hematology and Oncology Follow Up Visit  Karen [name] [id number] [birth year] 55 y.o. 02/20/2015   Principle Diagnosis:  Anemia of renal insufficiency Iron deficiency anemia  Current Therapy:   Aranesp 300 mcg subcutaneous for hemoglobin less than 10 IV iron as indicated    Interim History:  Karen [name] is here today for a follow-up. She continues to do well and has no complaints at that time. She has not required an iron infusion in over two years. Her last dose of Aranesp was in October of last year and she has responded nicely. Her Hgb today is 10.1 with and MCV of 92.  No fever, chills, n/v, cough, rash, ice cravings, dizziness, SOB, chest pain, palpitations, abdominal pain or changes in bowel or bladder habits.  No lymphadenopathy found on exam. No episodes of bleeding, bruising or petechiae.  No swelling, tenderness, numbness or tingling in her extremities. She has a good appetite and is staying well hydrated. Her weight is stable.   Medications:    Medication List       This list is accurate as of: 02/20/15  3:51 PM.  Always use your most recent med list.               buPROPion 150 MG 12 hr tablet  Commonly known as:  WELLBUTRIN SR  Take 150 mg by mouth every morning.     escitalopram 20 MG tablet  Commonly known as:  LEXAPRO  Take 20 mg by mouth daily.     haloperidol 1 MG tablet  Commonly known as:  HALDOL  Take 2 mg by mouth every morning.     levothyroxine 125 MCG tablet  Commonly known as:  SYNTHROID, LEVOTHROID  Take 1 tablet (125 mcg total) by mouth daily.     NIGHTTIME SLEEP AID PO  Take by mouth. Take one pill on weekends     omeprazole 20 MG capsule  Commonly known as:  PRILOSEC  TAKE 1 CAPSULE (20 MG TOTAL) BY MOUTH DAILY.     zolpidem 10 MG tablet  Commonly known as:  AMBIEN  Take 10 mg by mouth at bedtime.        Allergies:  Allergies  Allergen Reactions  . Milk-Related Compounds     Intolerance to milk products-causes gas     Past Medical History, Surgical history, Social history, and Family History were reviewed and updated.  Review of Systems: All other 10 point review of systems is negative.   Physical Exam:  height is 5\' 9"  (1.753 m) and weight is 176 lb (79.833 kg). Her oral temperature is 98.1 F (36.7 C). Her blood pressure is 107/71 and her pulse is 65. Her respiration is 16.   Wt Readings from Last 3 Encounters:  02/20/15 176 lb (79.833 kg)  11/18/14 170 lb (77.111 kg)  08/16/14 178 lb (80.74 kg)    Ocular: Sclerae unicteric, pupils equal, round and reactive to light Ear-nose-throat: Oropharynx clear, dentition fair Lymphatic: No cervical supraclavicular or axillary adenopathy Lungs no rales or rhonchi, good excursion bilaterally Heart regular rate and rhythm, no murmur appreciated Abd soft, nontender, positive bowel sounds, no liver or spleen tip palpated on exam  MSK no focal spinal tenderness, no joint edema Neuro: non-focal, well-oriented, appropriate affect Breasts: Deferred  Lab Results  Component Value Date   WBC 3.2* 02/20/2015   HGB 10.1* 02/20/2015   HCT 32.2* 02/20/2015   MCV 92 02/20/2015   PLT 163 02/20/2015   Lab Results  Component Value Date  FERRITIN 215 11/18/2014   IRON 57 11/18/2014   TIBC 238 11/18/2014   UIBC 180 11/18/2014   IRONPCTSAT 24 11/18/2014   Lab Results  Component Value Date   RETICCTPCT 0.8 11/18/2014   RBC 3.51* 02/20/2015   RETICCTABS 26.4 11/18/2014   No results found for: KPAFRELGTCHN, LAMBDASER, KAPLAMBRATIO No results found for: IGGSERUM, IGA, IGMSERUM No results found for: Odetta Pink, SPEI   Chemistry      Component Value Date/Time   NA 142 11/18/2014 1549   NA 139 06/16/2014 0954   K 3.7 11/18/2014 1549   K 3.6 06/16/2014 0954   CL 106 11/18/2014 1549   CL 107 06/16/2014 0954   CO2 26 11/18/2014 1549   CO2 28 06/16/2014 0954   BUN 10 11/18/2014 1549   BUN 12  06/16/2014 0954   CREATININE 1.11* 11/18/2014 1549   CREATININE 1.4* 06/16/2014 0954      Component Value Date/Time   CALCIUM 9.6 11/18/2014 1549   CALCIUM 9.3 06/16/2014 0954   ALKPHOS 67 11/18/2014 1549   ALKPHOS 72 06/16/2014 0954   AST 14 11/18/2014 1549   AST 19 06/16/2014 0954   ALT 6 11/18/2014 1549   ALT 13 06/16/2014 0954   BILITOT 0.7 11/18/2014 1549   BILITOT 1.10 06/16/2014 0954     Impression and Plan: Karen [name] is 55 year old [name] with multifactorial anemia (iron deficiency/renal insufficiency). She continues to do well and is asymptomatic at this time.  Her Hgb today is stable at 10.1 so she will not need a dose of Aranesp at this time.  We will see what her iron studies show and bring her in later this week for feraheme if needed.  We will see her back in 4 months for labs and follow-up.  She will contact us with any questions or concerns. We can certainly see her sooner if need be.   Eliezer Bottom, NP 1/16/20173:51 PM

## 2015-02-21 LAB — IRON AND TIBC
%SAT: 40 % (ref 21–57)
Iron: 103 ug/dL (ref 41–142)
TIBC: 259 ug/dL (ref 236–444)
UIBC: 155 ug/dL (ref 120–384)

## 2015-02-21 LAB — RETICULOCYTES: Reticulocyte Count: 0.9 % (ref 0.6–2.6)

## 2015-02-21 LAB — FERRITIN: Ferritin: 205 ng/ml (ref 9–269)

## 2015-03-02 ENCOUNTER — Other Ambulatory Visit: Payer: Self-pay | Admitting: Family Medicine

## 2015-03-30 ENCOUNTER — Other Ambulatory Visit: Payer: Self-pay | Admitting: Family Medicine

## 2015-04-29 ENCOUNTER — Other Ambulatory Visit: Payer: Self-pay | Admitting: Family Medicine

## 2015-06-21 ENCOUNTER — Other Ambulatory Visit: Payer: BLUE CROSS/BLUE SHIELD

## 2015-06-21 ENCOUNTER — Ambulatory Visit: Payer: BLUE CROSS/BLUE SHIELD

## 2015-06-21 ENCOUNTER — Ambulatory Visit: Payer: BLUE CROSS/BLUE SHIELD | Admitting: Family

## 2015-06-22 ENCOUNTER — Ambulatory Visit (HOSPITAL_BASED_OUTPATIENT_CLINIC_OR_DEPARTMENT_OTHER): Payer: BLUE CROSS/BLUE SHIELD

## 2015-06-22 ENCOUNTER — Encounter: Payer: Self-pay | Admitting: Family

## 2015-06-22 ENCOUNTER — Other Ambulatory Visit (HOSPITAL_BASED_OUTPATIENT_CLINIC_OR_DEPARTMENT_OTHER): Payer: BLUE CROSS/BLUE SHIELD

## 2015-06-22 ENCOUNTER — Ambulatory Visit (HOSPITAL_BASED_OUTPATIENT_CLINIC_OR_DEPARTMENT_OTHER): Payer: BLUE CROSS/BLUE SHIELD | Admitting: Family

## 2015-06-22 VITALS — BP 124/62 | HR 63 | Temp 98.4°F | Resp 16 | Ht 69.0 in | Wt 183.0 lb

## 2015-06-22 DIAGNOSIS — D6489 Other specified anemias: Secondary | ICD-10-CM

## 2015-06-22 DIAGNOSIS — D509 Iron deficiency anemia, unspecified: Secondary | ICD-10-CM

## 2015-06-22 DIAGNOSIS — D631 Anemia in chronic kidney disease: Secondary | ICD-10-CM

## 2015-06-22 DIAGNOSIS — N182 Chronic kidney disease, stage 2 (mild): Secondary | ICD-10-CM | POA: Diagnosis not present

## 2015-06-22 DIAGNOSIS — N189 Chronic kidney disease, unspecified: Secondary | ICD-10-CM

## 2015-06-22 LAB — CBC WITH DIFFERENTIAL (CANCER CENTER ONLY)
BASO#: 0 10*3/uL (ref 0.0–0.2)
BASO%: 0.4 % (ref 0.0–2.0)
EOS ABS: 0 10*3/uL (ref 0.0–0.5)
EOS%: 0.8 % (ref 0.0–7.0)
HEMATOCRIT: 31 % — AB (ref 34.8–46.6)
HGB: 9.7 g/dL — ABNORMAL LOW (ref 11.6–15.9)
LYMPH#: 1.1 10*3/uL (ref 0.9–3.3)
LYMPH%: 43 % (ref 14.0–48.0)
MCH: 29 pg (ref 26.0–34.0)
MCHC: 31.3 g/dL — ABNORMAL LOW (ref 32.0–36.0)
MCV: 93 fL (ref 81–101)
MONO#: 0.3 10*3/uL (ref 0.1–0.9)
MONO%: 11.2 % (ref 0.0–13.0)
NEUT#: 1.2 10*3/uL — ABNORMAL LOW (ref 1.5–6.5)
NEUT%: 44.6 % (ref 39.6–80.0)
PLATELETS: 186 10*3/uL (ref 145–400)
RBC: 3.34 10*6/uL — AB (ref 3.70–5.32)
RDW: 12.3 % (ref 11.1–15.7)
WBC: 2.6 10*3/uL — ABNORMAL LOW (ref 3.9–10.0)

## 2015-06-22 LAB — COMPREHENSIVE METABOLIC PANEL (CC13)
A/G RATIO: 1.3 (ref 1.2–2.2)
ALT: 8 IU/L (ref 0–32)
AST: 16 IU/L (ref 0–40)
Albumin, Serum: 4 g/dL (ref 3.5–5.5)
Alkaline Phosphatase, S: 77 IU/L (ref 39–117)
BILIRUBIN TOTAL: 0.4 mg/dL (ref 0.0–1.2)
BUN/Creatinine Ratio: 9 (ref 9–23)
BUN: 9 mg/dL (ref 6–24)
CALCIUM: 8.9 mg/dL (ref 8.7–10.2)
Carbon Dioxide, Total: 22 mmol/L (ref 18–29)
Chloride, Ser: 107 mmol/L — ABNORMAL HIGH (ref 96–106)
Creatinine, Ser: 1.01 mg/dL — ABNORMAL HIGH (ref 0.57–1.00)
GFR calc Af Amer: 72 mL/min/{1.73_m2} (ref >59)
GFR, EST NON AFRICAN AMERICAN: 62 mL/min/{1.73_m2} (ref >59)
GLOBULIN, TOTAL: 3.1 g/dL (ref 1.5–4.5)
Glucose: 118 mg/dL — ABNORMAL HIGH (ref 65–99)
POTASSIUM: 3.9 mmol/L (ref 3.5–5.2)
SODIUM: 143 mmol/L (ref 134–144)
Total Protein: 7.1 g/dL (ref 6.0–8.5)

## 2015-06-22 LAB — RETICULOCYTES: RETICULOCYTE COUNT: 1.1 % (ref 0.6–2.6)

## 2015-06-22 MED ORDER — DARBEPOETIN ALFA 300 MCG/0.6ML IJ SOSY
PREFILLED_SYRINGE | INTRAMUSCULAR | Status: AC
  Filled 2015-06-22: qty 0.6

## 2015-06-22 MED ORDER — DARBEPOETIN ALFA 300 MCG/0.6ML IJ SOSY
300.0000 ug | PREFILLED_SYRINGE | Freq: Once | INTRAMUSCULAR | Status: AC
  Administered 2015-06-22: 300 ug via SUBCUTANEOUS

## 2015-06-22 NOTE — Patient Instructions (Signed)
Darbepoetin Alfa injection What is this medicine? DARBEPOETIN ALFA (dar be POE e tin AL fa) helps your body make more red blood cells. It is used to treat anemia caused by chronic kidney failure and chemotherapy. This medicine may be used for other purposes; ask your health care provider or pharmacist if you have questions. What should I tell my health care provider before I take this medicine? They need to know if you have any of these conditions: -blood clotting disorders or history of blood clots -cancer patient not on chemotherapy -cystic fibrosis -heart disease, such as angina, heart failure, or a history of a heart attack -hemoglobin level of 12 g/dL or greater -high blood pressure -low levels of folate, iron, or vitamin B12 -seizures -an unusual or allergic reaction to darbepoetin, erythropoietin, albumin, hamster proteins, latex, other medicines, foods, dyes, or preservatives -pregnant or trying to get pregnant -breast-feeding How should I use this medicine? This medicine is for injection into a vein or under the skin. It is usually given by a health care professional in a hospital or clinic setting. If you get this medicine at home, you will be taught how to prepare and give this medicine. Do not shake the solution before you withdraw a dose. Use exactly as directed. Take your medicine at regular intervals. Do not take your medicine more often than directed. It is important that you put your used needles and syringes in a special sharps container. Do not put them in a trash can. If you do not have a sharps container, call your pharmacist or healthcare provider to get one. Talk to your pediatrician regarding the use of this medicine in children. While this medicine may be used in children as young as 1 year for selected conditions, precautions do apply. Overdosage: If you think you have taken too much of this medicine contact a poison control center or emergency room at once. NOTE:  This medicine is only for you. Do not share this medicine with others. What if I miss a dose? If you miss a dose, take it as soon as you can. If it is almost time for your next dose, take only that dose. Do not take double or extra doses. What may interact with this medicine? Do not take this medicine with any of the following medications: -epoetin alfa This list may not describe all possible interactions. Give your health care provider a list of all the medicines, herbs, non-prescription drugs, or dietary supplements you use. Also tell them if you smoke, drink alcohol, or use illegal drugs. Some items may interact with your medicine. What should I watch for while using this medicine? Visit your prescriber or health care professional for regular checks on your progress and for the needed blood tests and blood pressure measurements. It is especially important for the doctor to make sure your hemoglobin level is in the desired range, to limit the risk of potential side effects and to give you the best benefit. Keep all appointments for any recommended tests. Check your blood pressure as directed. Ask your doctor what your blood pressure should be and when you should contact him or her. As your body makes more red blood cells, you may need to take iron, folic acid, or vitamin B supplements. Ask your doctor or health care provider which products are right for you. If you have kidney disease continue dietary restrictions, even though this medication can make you feel better. Talk with your doctor or health care professional about the   foods you eat and the vitamins that you take. What side effects may I notice from receiving this medicine? Side effects that you should report to your doctor or health care professional as soon as possible: -allergic reactions like skin rash, itching or hives, swelling of the face, lips, or tongue -breathing problems -changes in vision -chest pain -confusion, trouble speaking  or understanding -feeling faint or lightheaded, falls -high blood pressure -muscle aches or pains -pain, swelling, warmth in the leg -rapid weight gain -severe headaches -sudden numbness or weakness of the face, arm or leg -trouble walking, dizziness, loss of balance or coordination -seizures (convulsions) -swelling of the ankles, feet, hands -unusually weak or tired Side effects that usually do not require medical attention (report to your doctor or health care professional if they continue or are bothersome): -diarrhea -fever, chills (flu-like symptoms) -headaches -nausea, vomiting -redness, stinging, or swelling at site where injected This list may not describe all possible side effects. Call your doctor for medical advice about side effects. You may report side effects to FDA at 1-800-FDA-1088. Where should I keep my medicine? Keep out of the reach of children. Store in a refrigerator between 2 and 8 degrees C (36 and 46 degrees F). Do not freeze. Do not shake. Throw away any unused portion if using a single-dose vial. Throw away any unused medicine after the expiration date. NOTE: This sheet is a summary. It may not cover all possible information. If you have questions about this medicine, talk to your doctor, pharmacist, or health care provider.    2016, [employer] Standard. (2008-01-05 10:23:57)  

## 2015-06-22 NOTE — Progress Notes (Signed)
Hematology and Oncology Follow Up Visit  Karen [name]:[id number] May 07, 1960 55 y.o. 06/22/2015   Principle Diagnosis:  Anemia of renal insufficiency Iron deficiency anemia  Current Therapy:   Aranesp 300 mcg subcutaneous for hemoglobin less than 10 IV iron as indicated    Interim History:  Karen [name] is here today for a follow-up. She received Aranesp last in October and has had a nice response. She is doing well and has no complaints at this time. Her Hgb is 9.7 with an MCV of 93.  No fever, chills, n/v, cough, rash, ice cravings, dizziness, SOB, chest pain, palpitations, abdominal pain or changes in bowel or bladder habits.  No lymphadenopathy found on exam. No episodes of bleeding, bruising or petechiae.  No swelling, tenderness, numbness or tingling in her extremities. No c/o joint aches or bone pain.  She has maintained a good appetite and is staying well hydrated. Her weight is stable.  She stays busy with work and still enjoys being in [employer] for the [employer] system.   Medications:    Medication List       This list is accurate as of: 06/22/15  3:29 PM.  Always use your most recent med list.               buPROPion 150 MG 12 hr tablet  Commonly known as:  WELLBUTRIN SR  Take 150 mg by mouth every morning.     escitalopram 20 MG tablet  Commonly known as:  LEXAPRO  Take 20 mg by mouth daily.     haloperidol 1 MG tablet  Commonly known as:  HALDOL  Take 2 mg by mouth every morning.     levothyroxine 125 MCG tablet  Commonly known as:  SYNTHROID, LEVOTHROID  TAKE 1 TABLET (125 MCG TOTAL) BY MOUTH DAILY.     levothyroxine 125 MCG tablet  Commonly known as:  SYNTHROID, LEVOTHROID  TAKE 1 TABLET (125 MCG TOTAL) BY MOUTH DAILY.     NIGHTTIME SLEEP AID PO  Take by mouth. Take one pill on weekends     omeprazole 20 MG capsule  Commonly known as:  PRILOSEC  TAKE 1 CAPSULE (20 MG TOTAL) BY MOUTH DAILY.     zolpidem 10 MG tablet  Commonly  known as:  AMBIEN  Take 10 mg by mouth at bedtime.        Allergies:  Allergies  Allergen Reactions  . Milk-Related Compounds     Intolerance to milk products-causes gas    Past Medical History, Surgical history, Social history, and Family History were reviewed and updated.  Review of Systems: All other 10 point review of systems is negative.   Physical Exam:  vitals were not taken for this visit.  Wt Readings from Last 3 Encounters:  02/20/15 176 lb (79.833 kg)  11/18/14 170 lb (77.111 kg)  08/16/14 178 lb (80.74 kg)    Ocular: Sclerae unicteric, pupils equal, round and reactive to light Ear-nose-throat: Oropharynx clear, dentition fair Lymphatic: No cervical supraclavicular or axillary adenopathy Lungs no rales or rhonchi, good excursion bilaterally Heart regular rate and rhythm, no murmur appreciated Abd soft, nontender, positive bowel sounds, no liver or spleen tip palpated on exam, no fluid wave  MSK no focal spinal tenderness, no joint edema Neuro: non-focal, well-oriented, appropriate affect Breasts: Deferred  Lab Results  Component Value Date   WBC 3.2* 02/20/2015   HGB 10.1* 02/20/2015   HCT 32.2* 02/20/2015   MCV 92 02/20/2015   PLT 163  02/20/2015   Lab Results  Component Value Date   FERRITIN 205 02/20/2015   IRON 103 02/20/2015   TIBC 259 02/20/2015   UIBC 155 02/20/2015   IRONPCTSAT 40 02/20/2015   Lab Results  Component Value Date   RETICCTPCT 0.8 11/18/2014   RBC 3.51* 02/20/2015   RETICCTABS 26.4 11/18/2014   No results found for: KPAFRELGTCHN, LAMBDASER, KAPLAMBRATIO No results found for: IGGSERUM, IGA, IGMSERUM No results found for: Karen [name], SPEI   Chemistry      Component Value Date/Time   NA 142 11/18/2014 1549   NA 139 06/16/2014 0954   K 3.7 11/18/2014 1549   K 3.6 06/16/2014 0954   CL 106 11/18/2014 1549   CL 107 06/16/2014 0954   CO2 26 11/18/2014 1549   CO2 28  06/16/2014 0954   BUN 10 11/18/2014 1549   BUN 12 06/16/2014 0954   CREATININE 1.11* 11/18/2014 1549   CREATININE 1.4* 06/16/2014 0954      Component Value Date/Time   CALCIUM 9.6 11/18/2014 1549   CALCIUM 9.3 06/16/2014 0954   ALKPHOS 67 11/18/2014 1549   ALKPHOS 72 06/16/2014 0954   AST 14 11/18/2014 1549   AST 19 06/16/2014 0954   ALT 6 11/18/2014 1549   ALT 13 06/16/2014 0954   BILITOT 0.7 11/18/2014 1549   BILITOT 1.10 06/16/2014 0954     Impression and Plan: Karen [name] is 55 year old [name] with multifactorial anemia (iron deficiency/renal insufficiency). She continues to do well and is asymptomatic at this time.  Her Hgb today is 9.7 so we will give her a dose of Aranesp today. We will see what her iron studies show and bring her in next week for an infusion if needed.  We will see her back in 4 months for labs and follow-up.  She will contact us with any questions or concerns. We can certainly see her sooner if need be.   Karen Bottom, NP 5/18/20173:29 PM

## 2015-06-23 LAB — FERRITIN CHCC: FERRITIN: 202 ng/mL — AB (ref 15–150)

## 2015-06-23 LAB — IRON AND TIBC CHCC
IRON SATURATION: 29 % (ref 15–55)
Iron: 67 ug/dL (ref 27–159)
TIBC: 235 ug/dL — AB (ref 250–450)
UIBC: 168 ug/dL (ref 131–425)

## 2015-06-26 ENCOUNTER — Encounter: Payer: Self-pay | Admitting: Hematology & Oncology

## 2015-06-27 ENCOUNTER — Other Ambulatory Visit: Payer: Self-pay | Admitting: Family

## 2015-06-27 DIAGNOSIS — N189 Chronic kidney disease, unspecified: Secondary | ICD-10-CM

## 2015-06-27 DIAGNOSIS — D631 Anemia in chronic kidney disease: Secondary | ICD-10-CM | POA: Insufficient documentation

## 2015-07-04 ENCOUNTER — Telehealth: Payer: Self-pay | Admitting: *Deleted

## 2015-07-04 NOTE — Telephone Encounter (Signed)
Patient c/o constipation. She states she has been eating cheese steak hoagies nightly since Friday and then ended up constipated over the weekend. She started taking Miralax, Milk of Magnesia and a stool softener yesterday and by the evening was having loose stools. She wants to know what she should do for constipation going forward.  Spoke to [facility] NP and she wants patient to stop all laxatives and stool softener until the loose stools stop. If the patient starts to experience constipation again, she can restart Miralax daily. Patient understood these instructions confirmed with teach back.

## 2015-07-21 ENCOUNTER — Other Ambulatory Visit: Payer: Self-pay | Admitting: Family

## 2015-07-21 DIAGNOSIS — D631 Anemia in chronic kidney disease: Secondary | ICD-10-CM

## 2015-07-21 DIAGNOSIS — N189 Chronic kidney disease, unspecified: Secondary | ICD-10-CM | POA: Insufficient documentation

## 2015-07-29 ENCOUNTER — Other Ambulatory Visit: Payer: Self-pay | Admitting: Family Medicine

## 2015-08-27 ENCOUNTER — Other Ambulatory Visit: Payer: Self-pay | Admitting: Family Medicine

## 2015-08-28 NOTE — Telephone Encounter (Signed)
Rx refill sent to pharmacy. 

## 2015-10-04 ENCOUNTER — Other Ambulatory Visit (INDEPENDENT_AMBULATORY_CARE_PROVIDER_SITE_OTHER): Payer: BLUE CROSS/BLUE SHIELD

## 2015-10-04 DIAGNOSIS — Z0000 Encounter for general adult medical examination without abnormal findings: Secondary | ICD-10-CM

## 2015-10-04 LAB — POC URINALSYSI DIPSTICK (AUTOMATED)
Bilirubin, UA: NEGATIVE
Blood, UA: NEGATIVE
Glucose, UA: NEGATIVE
Ketones, UA: NEGATIVE
NITRITE UA: NEGATIVE
PH UA: 6
Spec Grav, UA: 1.015
Urobilinogen, UA: 1

## 2015-10-04 LAB — CBC WITH DIFFERENTIAL/PLATELET
BASOS PCT: 1.3 % (ref 0.0–3.0)
Basophils Absolute: 0 10*3/uL (ref 0.0–0.1)
EOS PCT: 1.3 % (ref 0.0–5.0)
Eosinophils Absolute: 0 10*3/uL (ref 0.0–0.7)
HCT: 31.8 % — ABNORMAL LOW (ref 36.0–46.0)
HEMOGLOBIN: 10.3 g/dL — AB (ref 12.0–15.0)
LYMPHS ABS: 1.8 10*3/uL (ref 0.7–4.0)
Lymphocytes Relative: 52.4 % — ABNORMAL HIGH (ref 12.0–46.0)
MCHC: 32.2 g/dL (ref 30.0–36.0)
MCV: 88.9 fl (ref 78.0–100.0)
MONOS PCT: 8.4 % (ref 3.0–12.0)
Monocytes Absolute: 0.3 10*3/uL (ref 0.1–1.0)
NEUTROS PCT: 36.6 % — AB (ref 43.0–77.0)
Neutro Abs: 1.2 10*3/uL — ABNORMAL LOW (ref 1.4–7.7)
Platelets: 188 10*3/uL (ref 150.0–400.0)
RBC: 3.58 Mil/uL — AB (ref 3.87–5.11)
RDW: 14 % (ref 11.5–15.5)
WBC: 3.4 10*3/uL — ABNORMAL LOW (ref 4.0–10.5)

## 2015-10-04 LAB — TSH: TSH: 1.34 u[IU]/mL (ref 0.35–4.50)

## 2015-10-04 LAB — LIPID PANEL
CHOLESTEROL: 191 mg/dL (ref 0–200)
HDL: 49.6 mg/dL (ref >39.00)
LDL Cholesterol: 123 mg/dL — ABNORMAL HIGH (ref 0–99)
NonHDL: 141.03
Total CHOL/HDL Ratio: 4
Triglycerides: 90 mg/dL (ref 0.0–149.0)
VLDL: 18 mg/dL (ref 0.0–40.0)

## 2015-10-04 LAB — HEPATIC FUNCTION PANEL
ALBUMIN: 4.2 g/dL (ref 3.5–5.2)
ALT: 5 U/L (ref 0–35)
AST: 13 U/L (ref 0–37)
Alkaline Phosphatase: 64 U/L (ref 39–117)
Bilirubin, Direct: 0.2 mg/dL (ref 0.0–0.3)
Total Bilirubin: 1.1 mg/dL (ref 0.2–1.2)
Total Protein: 7.2 g/dL (ref 6.0–8.3)

## 2015-10-04 LAB — BASIC METABOLIC PANEL
BUN: 6 mg/dL (ref 6–23)
CHLORIDE: 107 meq/L (ref 96–112)
CO2: 29 meq/L (ref 19–32)
Calcium: 9.1 mg/dL (ref 8.4–10.5)
Creatinine, Ser: 1.12 mg/dL (ref 0.40–1.20)
GFR: 64.63 mL/min (ref >60.00)
Glucose, Bld: 98 mg/dL (ref 70–99)
POTASSIUM: 3.2 meq/L — AB (ref 3.5–5.1)
SODIUM: 144 meq/L (ref 135–145)

## 2015-10-11 ENCOUNTER — Ambulatory Visit (INDEPENDENT_AMBULATORY_CARE_PROVIDER_SITE_OTHER): Payer: BLUE CROSS/BLUE SHIELD | Admitting: Family Medicine

## 2015-10-11 ENCOUNTER — Encounter: Payer: Self-pay | Admitting: Family Medicine

## 2015-10-11 ENCOUNTER — Other Ambulatory Visit (HOSPITAL_COMMUNITY)
Admission: RE | Admit: 2015-10-11 | Discharge: 2015-10-11 | Disposition: A | Discharge status: Home / Self Care | Payer: BLUE CROSS/BLUE SHIELD | Source: Ambulatory Visit | Attending: Family Medicine | Admitting: Family Medicine

## 2015-10-11 VITALS — BP 136/78 | HR 65 | Temp 98.0°F | Ht 69.0 in | Wt 182.3 lb

## 2015-10-11 DIAGNOSIS — N189 Chronic kidney disease, unspecified: Secondary | ICD-10-CM | POA: Diagnosis not present

## 2015-10-11 DIAGNOSIS — Z01419 Encounter for gynecological examination (general) (routine) without abnormal findings: Secondary | ICD-10-CM | POA: Insufficient documentation

## 2015-10-11 DIAGNOSIS — Z23 Encounter for immunization: Secondary | ICD-10-CM

## 2015-10-11 DIAGNOSIS — Z0000 Encounter for general adult medical examination without abnormal findings: Secondary | ICD-10-CM | POA: Insufficient documentation

## 2015-10-11 MED ORDER — OMEPRAZOLE 20 MG PO CPDR: DELAYED_RELEASE_CAPSULE | ORAL | 3 refills | Status: DC

## 2015-10-11 MED ORDER — LEVOTHYROXINE SODIUM 125 MCG PO TABS: ORAL_TABLET | ORAL | 3 refills | Status: DC

## 2015-10-11 MED ORDER — POTASSIUM CHLORIDE CRYS ER 20 MEQ PO TBCR: 20.0000 meq | EXTENDED_RELEASE_TABLET | ORAL | 3 refills | Status: DC

## 2015-10-11 NOTE — Patient Instructions (Signed)
Continue current medication  Follow-up in one year sooner if any problems 

## 2015-10-11 NOTE — Progress Notes (Signed)
silveria is a 55 year old married female nonsmoker who comes in today for general physical examination because of a history of anemia of unknown etiology. She is now followed in oncology by Dr. Collier Salina E every 3-4 months. Her hemoglobin is stable at 10.3. Her renal function is normal. They felt the anemia was related to chronic renal disease  She gets routine eye care, dental care, BSE monthly, annual mammography, colonoscopy last 2011 was normal. The prior colonoscopy showed a polyp.  Vaccinations up-to-date seasonal flu shot given today  Current medications Wellbutrin 150 mg, Lexapro 20 mg, Haldol 1 mg via her psychiatrist. She says she's doing well no problems.  She takes Synthroid 125 g daily because of a history of hypothyroidism. TSH level normal. She also takes Prilosec 20 mg because of chronic reflux esophagitis.  Weight is unchanged 182 pounds. BP normal 124/62.  She says she feels well and has no major complaints  Review of systems otherwise negative except she had had a Pap smear in 3 years. She went to menopause at age 78. She says she is asymptomatic.  Physical exam vital signs stable she's afebrile HEENT were negative neck was supple no adenopathy thyroid normal cardiopulmonary exam normal Dahms abnormal pelvic examination external genitalia within normal limits vaginal vault was normal bimanual exam was negative Pap smear was done rectum normal stool guaiac-negative  Extremities normal skin normal peripheral pulses normal  Impression  #1 anemia etiology unknown....... continue follow-up by oncology  #2 postmenopausal 11 years........ denies symptoms.  #3.. History of depression followed by psychiatry  #4..... History of hypothyroidism continue Synthroid  #5 chronic reflux esophagitis....... continue Prilosec  #6 low potassium........... potassium supplement 20 mEq daily forever

## 2015-10-12 LAB — CYTOLOGY - PAP

## 2015-10-27 ENCOUNTER — Ambulatory Visit (HOSPITAL_BASED_OUTPATIENT_CLINIC_OR_DEPARTMENT_OTHER): Payer: BLUE CROSS/BLUE SHIELD | Admitting: Family

## 2015-10-27 ENCOUNTER — Ambulatory Visit: Payer: BLUE CROSS/BLUE SHIELD

## 2015-10-27 ENCOUNTER — Encounter: Payer: Self-pay | Admitting: Family

## 2015-10-27 ENCOUNTER — Other Ambulatory Visit (HOSPITAL_BASED_OUTPATIENT_CLINIC_OR_DEPARTMENT_OTHER): Payer: BLUE CROSS/BLUE SHIELD

## 2015-10-27 VITALS — BP 118/61 | HR 69 | Temp 97.9°F | Resp 18 | Ht 69.0 in | Wt 179.8 lb

## 2015-10-27 DIAGNOSIS — D631 Anemia in chronic kidney disease: Secondary | ICD-10-CM | POA: Diagnosis not present

## 2015-10-27 DIAGNOSIS — D509 Iron deficiency anemia, unspecified: Secondary | ICD-10-CM

## 2015-10-27 DIAGNOSIS — N189 Chronic kidney disease, unspecified: Secondary | ICD-10-CM

## 2015-10-27 LAB — COMPREHENSIVE METABOLIC PANEL (CC13)
A/G RATIO: 1.3 (ref 1.2–2.2)
ALT: 5 IU/L (ref 0–32)
AST (SGOT): 15 IU/L (ref 0–40)
Albumin, Serum: 4.3 g/dL (ref 3.5–5.5)
Alkaline Phosphatase, S: 81 IU/L (ref 39–117)
BILIRUBIN TOTAL: 0.7 mg/dL (ref 0.0–1.2)
BUN / CREAT RATIO: 7 — AB (ref 9–23)
BUN: 8 mg/dL (ref 6–24)
Calcium, Ser: 9.6 mg/dL (ref 8.7–10.2)
Carbon Dioxide, Total: 27 mmol/L (ref 18–29)
Chloride, Ser: 105 mmol/L (ref 96–106)
Creatinine, Ser: 1.21 mg/dL — ABNORMAL HIGH (ref 0.57–1.00)
GFR, EST AFRICAN AMERICAN: 58 mL/min/{1.73_m2} — AB (ref >59)
GFR, EST NON AFRICAN AMERICAN: 50 mL/min/{1.73_m2} — AB (ref >59)
GLOBULIN, TOTAL: 3.4 g/dL (ref 1.5–4.5)
Glucose: 82 mg/dL (ref 65–99)
POTASSIUM: 4.1 mmol/L (ref 3.5–5.2)
SODIUM: 139 mmol/L (ref 134–144)
TOTAL PROTEIN: 7.7 g/dL (ref 6.0–8.5)

## 2015-10-27 LAB — CBC WITH DIFFERENTIAL (CANCER CENTER ONLY)
BASO#: 0 10*3/uL (ref 0.0–0.2)
BASO%: 0.3 % (ref 0.0–2.0)
EOS ABS: 0 10*3/uL (ref 0.0–0.5)
EOS%: 1 % (ref 0.0–7.0)
HCT: 33.1 % — ABNORMAL LOW (ref 34.8–46.6)
HEMOGLOBIN: 10.5 g/dL — AB (ref 11.6–15.9)
LYMPH#: 1 10*3/uL (ref 0.9–3.3)
LYMPH%: 35.1 % (ref 14.0–48.0)
MCH: 29.4 pg (ref 26.0–34.0)
MCHC: 31.7 g/dL — AB (ref 32.0–36.0)
MCV: 93 fL (ref 81–101)
MONO#: 0.2 10*3/uL (ref 0.1–0.9)
MONO%: 7.6 % (ref 0.0–13.0)
NEUT#: 1.6 10*3/uL (ref 1.5–6.5)
NEUT%: 56 % (ref 39.6–80.0)
PLATELETS: 228 10*3/uL (ref 145–400)
RBC: 3.57 10*6/uL — ABNORMAL LOW (ref 3.70–5.32)
RDW: 13.3 % (ref 11.1–15.7)
WBC: 2.9 10*3/uL — ABNORMAL LOW (ref 3.9–10.0)

## 2015-10-27 NOTE — Progress Notes (Signed)
Hematology and Oncology Follow Up Visit  Karen [name] [id number] 21-May-1960 55 y.o. 10/27/2015   Principle Diagnosis:  Anemia of renal insufficiency Iron deficiency anemia  Current Therapy:   Aranesp 300 mcg subcutaneous for hemoglobin less than 10 IV iron as indicated    Interim History:  Karen [name] is here today for a follow-up. She is doing well and has no complaints at this time. 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  She last received Aranesp in May and has had a nice response. Her Hgb is now 10.5 with an MCV of 93.  No fatigue, fever, chills, n/v, cough, rash, ice cravings, dizziness, SOB, chest pain, palpitations, abdominal pain or changes in bowel or bladder habits.  No lymphadenopathy found on exam. No episodes of bleeding, bruising or petechiae.  No swelling, tenderness, numbness or tingling in her extremities. No c/o joint aches or bone pain.  She has maintained a good appetite and is staying well hydrated. Her weight is stable.   Medications:    Medication List       Accurate as of 10/27/15  3:29 PM. Always use your most recent med list.          buPROPion 150 MG 12 hr tablet Commonly known as:  WELLBUTRIN SR Take 150 mg by mouth every morning.   escitalopram 20 MG tablet Commonly known as:  LEXAPRO Take 20 mg by mouth daily.   haloperidol 1 MG tablet Commonly known as:  HALDOL Take 2 mg by mouth every morning.   levothyroxine 125 MCG tablet Commonly known as:  SYNTHROID, LEVOTHROID TAKE 1 TABLET (125 MCG TOTAL) BY MOUTH DAILY.   NIGHTTIME SLEEP AID PO Take by mouth. Take one pill on weekends   omeprazole 20 MG capsule Commonly known as:  PRILOSEC TAKE 1 CAPSULE (20 MG TOTAL) BY MOUTH DAILY.   potassium chloride SA 20 MEQ tablet Commonly known as:  K-DUR,KLOR-CON Take 1 tablet (20 mEq total) by mouth 1 day or 1 dose.       Allergies:  Allergies  Allergen Reactions  .  Milk-Related Compounds     Intolerance to milk products-causes gas    Past Medical History, Surgical history, Social history, and Family History were reviewed and updated.  Review of Systems: All other 10 point review of systems is negative.   Physical Exam:  vitals were not taken for this visit.  Wt Readings from Last 3 Encounters:  10/11/15 182 lb 4.8 oz (82.7 kg)  06/22/15 183 lb (83 kg)  02/20/15 176 lb (79.8 kg)    Ocular: Sclerae unicteric, pupils equal, round and reactive to light Ear-nose-throat: Oropharynx clear, dentition fair Lymphatic: No cervical supraclavicular or axillary adenopathy Lungs no rales or rhonchi, good excursion bilaterally Heart regular rate and rhythm, no murmur appreciated Abd soft, nontender, positive bowel sounds, no liver or spleen tip palpated on exam, no fluid wave  MSK no focal spinal tenderness, no joint edema Neuro: non-focal, well-oriented, appropriate affect Breasts: Deferred  Lab Results  Component Value Date   WBC 2.9 (L) 10/27/2015   HGB 10.5 (L) 10/27/2015   HCT 33.1 (L) 10/27/2015   MCV 93 10/27/2015   PLT 228 10/27/2015   Lab Results  Component Value Date   FERRITIN 202 (H) 06/22/2015   IRON 67 06/22/2015   TIBC 235 (L) 06/22/2015   UIBC 168 06/22/2015   IRONPCTSAT 29 06/22/2015   Lab Results  Component Value  Date   RETICCTPCT 0.8 11/18/2014   RBC 3.57 (L) 10/27/2015   RETICCTABS 26.4 11/18/2014   No results found for: KPAFRELGTCHN, LAMBDASER, KAPLAMBRATIO No results found for: IGGSERUM, IGA, IGMSERUM No results found for: Odetta Pink, SPEI   Chemistry      Component Value Date/Time   NA 144 10/04/2015 0839   NA 143 06/22/2015 1514   NA 139 06/16/2014 0954   K 3.2 (L) 10/04/2015 0839   K 3.9 06/22/2015 1514   K 3.6 06/16/2014 0954   CL 107 10/04/2015 0839   CL 107 (H) 06/22/2015 1514   CL 107 06/16/2014 0954   CO2 29 10/04/2015 0839   CO2 22 06/22/2015  1514   CO2 28 06/16/2014 0954   BUN 6 10/04/2015 0839   BUN 9 06/22/2015 1514   BUN 12 06/16/2014 0954   CREATININE 1.12 10/04/2015 0839   CREATININE 1.01 (H) 06/22/2015 1514   CREATININE 1.4 (H) 06/16/2014 0954      Component Value Date/Time   CALCIUM 9.1 10/04/2015 0839   CALCIUM 8.9 06/22/2015 1514   CALCIUM 9.3 06/16/2014 0954   ALKPHOS 64 10/04/2015 0839   ALKPHOS 77 06/22/2015 1514   ALKPHOS 72 06/16/2014 0954   AST 13 10/04/2015 0839   AST 16 06/22/2015 1514   AST 19 06/16/2014 0954   ALT 5 10/04/2015 0839   ALT 8 06/22/2015 1514   ALT 13 06/16/2014 0954   BILITOT 1.1 10/04/2015 0839   BILITOT 0.4 06/22/2015 1514   BILITOT 1.10 06/16/2014 0954     Impression and Plan: Karen [name] is 55 year old [name] with multifactorial anemia (iron deficiency/renal insufficiency). She is doing well and is asymptomatic at this time.  Hgb is now 10.5 so she will not need an Aranesp injection at this time.  We will see what her iron studies show and bring her in next week for an infusion if needed.  We will see her back in 4 months for labs and follow-up.  She will contact us with any questions or concerns. We can certainly see her sooner if need be.   Eliezer Bottom, NP 9/22/20173:29 PM

## 2015-10-28 LAB — RETICULOCYTES: RETICULOCYTE COUNT: 0.9 % (ref 0.6–2.6)

## 2015-10-30 ENCOUNTER — Telehealth: Payer: Self-pay | Admitting: *Deleted

## 2015-10-30 LAB — IRON AND TIBC
%SAT: 37 % (ref 21–57)
Iron: 90 ug/dL (ref 41–142)
TIBC: 248 ug/dL (ref 236–444)
UIBC: 157 ug/dL (ref 120–384)

## 2015-10-30 LAB — FERRITIN: FERRITIN: 216 ng/mL (ref 9–269)

## 2015-10-30 NOTE — Telephone Encounter (Addendum)
Patient aware of results  ----- Message from Eliezer Bottom, NP sent at 10/30/2015 11:26 AM EDT ----- Regarding: iron  Iron studies look great! No infusion needed!  Sarah  ----- Message ----- From: Interface, Lab In Three Zero One Sent: 10/27/2015   3:25 PM To: Eliezer Bottom, NP

## 2015-11-10 ENCOUNTER — Other Ambulatory Visit: Payer: Self-pay | Admitting: Family Medicine

## 2016-01-24 ENCOUNTER — Other Ambulatory Visit: Payer: Self-pay | Admitting: Family Medicine

## 2016-01-24 DIAGNOSIS — Z1231 Encounter for screening mammogram for malignant neoplasm of breast: Secondary | ICD-10-CM

## 2016-02-18 ENCOUNTER — Other Ambulatory Visit: Payer: Self-pay | Admitting: Family Medicine

## 2016-02-22 ENCOUNTER — Ambulatory Visit
Admission: RE | Admit: 2016-02-22 | Discharge: 2016-02-22 | Disposition: A | Discharge status: Home / Self Care | Payer: BLUE CROSS/BLUE SHIELD | Source: Ambulatory Visit | Attending: Family Medicine | Admitting: Family Medicine

## 2016-02-22 DIAGNOSIS — Z1231 Encounter for screening mammogram for malignant neoplasm of breast: Secondary | ICD-10-CM

## 2016-03-01 ENCOUNTER — Ambulatory Visit (HOSPITAL_BASED_OUTPATIENT_CLINIC_OR_DEPARTMENT_OTHER): Payer: BLUE CROSS/BLUE SHIELD | Admitting: Family

## 2016-03-01 ENCOUNTER — Ambulatory Visit: Payer: BLUE CROSS/BLUE SHIELD

## 2016-03-01 ENCOUNTER — Other Ambulatory Visit (HOSPITAL_BASED_OUTPATIENT_CLINIC_OR_DEPARTMENT_OTHER): Payer: BLUE CROSS/BLUE SHIELD

## 2016-03-01 VITALS — BP 114/62 | HR 68 | Temp 97.8°F | Resp 18 | Wt 187.8 lb

## 2016-03-01 DIAGNOSIS — N189 Chronic kidney disease, unspecified: Secondary | ICD-10-CM

## 2016-03-01 DIAGNOSIS — D508 Other iron deficiency anemias: Secondary | ICD-10-CM

## 2016-03-01 DIAGNOSIS — D509 Iron deficiency anemia, unspecified: Secondary | ICD-10-CM

## 2016-03-01 DIAGNOSIS — D631 Anemia in chronic kidney disease: Secondary | ICD-10-CM | POA: Diagnosis not present

## 2016-03-01 LAB — CBC WITH DIFFERENTIAL (CANCER CENTER ONLY)
BASO#: 0 10*3/uL (ref 0.0–0.2)
BASO%: 0.3 % (ref 0.0–2.0)
EOS%: 1.4 % (ref 0.0–7.0)
Eosinophils Absolute: 0.1 10*3/uL (ref 0.0–0.5)
HCT: 32.8 % — ABNORMAL LOW (ref 34.8–46.6)
HGB: 10.1 g/dL — ABNORMAL LOW (ref 11.6–15.9)
LYMPH#: 1.4 10*3/uL (ref 0.9–3.3)
LYMPH%: 38.6 % (ref 14.0–48.0)
MCH: 29.1 pg (ref 26.0–34.0)
MCHC: 30.8 g/dL — ABNORMAL LOW (ref 32.0–36.0)
MCV: 95 fL (ref 81–101)
MONO#: 0.4 10*3/uL (ref 0.1–0.9)
MONO%: 11.5 % (ref 0.0–13.0)
NEUT#: 1.7 10*3/uL (ref 1.5–6.5)
NEUT%: 48.2 % (ref 39.6–80.0)
Platelets: 188 10*3/uL (ref 145–400)
RBC: 3.47 10*6/uL — ABNORMAL LOW (ref 3.70–5.32)
RDW: 12.4 % (ref 11.1–15.7)
WBC: 3.6 10*3/uL — ABNORMAL LOW (ref 3.9–10.0)

## 2016-03-01 LAB — COMPREHENSIVE METABOLIC PANEL (CC13)
ALBUMIN: 4.1 g/dL (ref 3.5–5.5)
ALT: 6 IU/L (ref 0–32)
AST: 13 IU/L (ref 0–40)
Albumin/Globulin Ratio: 1.2 (ref 1.2–2.2)
Alkaline Phosphatase, S: 90 IU/L (ref 39–117)
BUN / CREAT RATIO: 12 (ref 9–23)
BUN: 14 mg/dL (ref 6–24)
Bilirubin Total: 0.3 mg/dL (ref 0.0–1.2)
CALCIUM: 9.6 mg/dL (ref 8.7–10.2)
CO2: 26 mmol/L (ref 18–29)
CREATININE: 1.14 mg/dL — AB (ref 0.57–1.00)
Chloride, Ser: 102 mmol/L (ref 96–106)
GFR calc Af Amer: 62 mL/min/{1.73_m2} (ref >59)
GFR, EST NON AFRICAN AMERICAN: 54 mL/min/{1.73_m2} — AB (ref >59)
Globulin, Total: 3.5 g/dL (ref 1.5–4.5)
Glucose: 90 mg/dL (ref 65–99)
Potassium, Ser: 4.2 mmol/L (ref 3.5–5.2)
Sodium: 135 mmol/L (ref 134–144)
TOTAL PROTEIN: 7.6 g/dL (ref 6.0–8.5)

## 2016-03-01 NOTE — Progress Notes (Signed)
Hematology and Oncology Follow Up Visit  LUCCIANA LEUSCHEN [place]:[id number] 1960/12/12 56 y.o. 03/01/2016   Principle Diagnosis:  Anemia of renal insufficiency Iron deficiency anemia  Current Therapy:   Aranesp 300 mcg subcutaneous for hemoglobin less than 10 IV iron as indicated    Interim History:  Ms. Valdes is here today for a follow-up. She is doing quite well. She had several snow days off and was able to catch up on her rest. She feels energized and has no complaints at this time. Her Hgb is stable at 10.1 with an MCV of 95. Iron studies are pending.  She has started taking an "energizing iron supplement" from the health food store and feels that this has also helped with her energy.  No fever, chills, n/v, cough, rash, ice cravings, dizziness, SOB, chest pain, palpitations, abdominal pain or changes in bowel or bladder habits.  No lymphadenopathy found on exam. No episodes of bleeding, bruising or petechiae.  No swelling, tenderness, numbness or tingling in her extremities. No c/o joint aches or bone pain.  She has maintained a good appetite and is staying well hydrated. Her weight is stable.   Medications:  Allergies as of 03/01/2016      Reactions   Milk-related Compounds    Intolerance to milk products-causes gas      Medication List       Accurate as of 03/01/16  3:20 PM. Always use your most recent med list.          buPROPion 150 MG 12 hr tablet Commonly known as:  WELLBUTRIN SR Take 150 mg by mouth every morning.   escitalopram 20 MG tablet Commonly known as:  LEXAPRO Take 20 mg by mouth daily.   haloperidol 1 MG tablet Commonly known as:  HALDOL Take 2 mg by mouth every morning.   levothyroxine 125 MCG tablet Commonly known as:  SYNTHROID, LEVOTHROID TAKE 1 TABLET (125 MCG TOTAL) BY MOUTH DAILY.   levothyroxine 125 MCG tablet Commonly known as:  SYNTHROID, LEVOTHROID TAKE 1 TABLET (125 MCG TOTAL) BY MOUTH DAILY.   omeprazole 20 MG capsule Commonly  known as:  PRILOSEC TAKE 1 CAPSULE (20 MG TOTAL) BY MOUTH DAILY.   OVER THE COUNTER MEDICATION   potassium chloride SA 20 MEQ tablet Commonly known as:  K-DUR,KLOR-CON Take 1 tablet (20 mEq total) by mouth 1 day or 1 dose.       Allergies:  Allergies  Allergen Reactions  . Milk-Related Compounds     Intolerance to milk products-causes gas    Past Medical History, Surgical history, Social history, and Family History were reviewed and updated.  Review of Systems: All other 10 point review of systems is negative.   Physical Exam:  weight is 187 lb 12.8 oz (85.2 kg). Her oral temperature is 97.8 F (36.6 C). Her blood pressure is 114/62 and her pulse is 68. Her respiration is 18 and oxygen saturation is 100%.   Wt Readings from Last 3 Encounters:  03/01/16 187 lb 12.8 oz (85.2 kg)  10/27/15 179 lb 12.8 oz (81.6 kg)  10/11/15 182 lb 4.8 oz (82.7 kg)    Ocular: Sclerae unicteric, pupils equal, round and reactive to light Ear-nose-throat: Oropharynx clear, dentition fair Lymphatic: No cervical supraclavicular or axillary adenopathy Lungs no rales or rhonchi, good excursion bilaterally Heart regular rate and rhythm, no murmur appreciated Abd soft, nontender, positive bowel sounds, no liver or spleen tip palpated on exam, no fluid wave  MSK no focal spinal tenderness, no  joint edema Neuro: non-focal, well-oriented, appropriate affect Breasts: Deferred  Lab Results  Component Value Date   WBC 3.6 (L) 03/01/2016   HGB 10.1 (L) 03/01/2016   HCT 32.8 (L) 03/01/2016   MCV 95 03/01/2016   PLT 188 03/01/2016   Lab Results  Component Value Date   FERRITIN 216 10/27/2015   IRON 90 10/27/2015   TIBC 248 10/27/2015   UIBC 157 10/27/2015   IRONPCTSAT 37 10/27/2015   Lab Results  Component Value Date   RETICCTPCT 0.8 11/18/2014   RBC 3.47 (L) 03/01/2016   RETICCTABS 26.4 11/18/2014   No results found for: KPAFRELGTCHN, LAMBDASER, KAPLAMBRATIO No results found for:  IGGSERUM, IGA, IGMSERUM No results found for: Odetta Pink, SPEI   Chemistry      Component Value Date/Time   NA 139 10/27/2015 1510   NA 139 06/16/2014 0954   K 4.1 10/27/2015 1510   K 3.6 06/16/2014 0954   CL 105 10/27/2015 1510   CL 107 06/16/2014 0954   CO2 27 10/27/2015 1510   CO2 28 06/16/2014 0954   BUN 8 10/27/2015 1510   BUN 12 06/16/2014 0954   CREATININE 1.21 (H) 10/27/2015 1510   CREATININE 1.4 (H) 06/16/2014 0954      Component Value Date/Time   CALCIUM 9.6 10/27/2015 1510   CALCIUM 9.3 06/16/2014 0954   ALKPHOS 81 10/27/2015 1510   ALKPHOS 72 06/16/2014 0954   AST 15 10/27/2015 1510   AST 19 06/16/2014 0954   ALT 5 10/27/2015 1510   ALT 13 06/16/2014 0954   BILITOT 0.7 10/27/2015 1510   BILITOT 1.10 06/16/2014 0954     Impression and Plan: Ms. Beber is 56 year old [name] with multifactorial anemia (iron deficiency/renal insufficiency). She is doing well and feels that her oral iron supplement has really helped boost her energy. She has responded nicely to Aranesp.  Hgb today is stable at 10.1 so she will not need an injection today.  We will see what her iron studies show and bring her back in next week for an infusion if needed.  We will plan to see her back in 4 months for labs and follow-up.  She will contact us with any questions or concerns. We can certainly see her sooner if need be.   Eliezer Bottom, NP 1/26/20183:20 PM

## 2016-03-02 LAB — RETICULOCYTES: Reticulocyte Count: 1 % (ref 0.6–2.6)

## 2016-03-04 ENCOUNTER — Telehealth: Payer: Self-pay | Admitting: *Deleted

## 2016-03-04 LAB — IRON AND TIBC
%SAT: 37 % (ref 21–57)
IRON: 92 ug/dL (ref 41–142)
TIBC: 248 ug/dL (ref 236–444)
UIBC: 155 ug/dL (ref 120–384)

## 2016-03-04 LAB — FERRITIN: FERRITIN: 219 ng/mL (ref 9–269)

## 2016-03-04 NOTE — Telephone Encounter (Addendum)
Patient aware of results   ----- Message from Eliezer Bottom, NP sent at 03/04/2016 11:43 AM EST ----- Regarding: Iron Iron studies look good, no infusion needed at this time. Thank you!  Sarah  ----- Message ----- From: Interface, Lab In Three Zero One Sent: 03/01/2016   3:16 PM To: Eliezer Bottom, NP

## 2016-06-06 ENCOUNTER — Ambulatory Visit (INDEPENDENT_AMBULATORY_CARE_PROVIDER_SITE_OTHER): Payer: BLUE CROSS/BLUE SHIELD | Admitting: Family Medicine

## 2016-06-06 ENCOUNTER — Encounter: Payer: Self-pay | Admitting: Family Medicine

## 2016-06-06 VITALS — BP 104/70 | HR 63 | Resp 12 | Ht 69.0 in | Wt 182.0 lb

## 2016-06-06 DIAGNOSIS — E876 Hypokalemia: Secondary | ICD-10-CM | POA: Diagnosis not present

## 2016-06-06 DIAGNOSIS — F32A Depression, unspecified: Secondary | ICD-10-CM

## 2016-06-06 DIAGNOSIS — F329 Major depressive disorder, single episode, unspecified: Secondary | ICD-10-CM

## 2016-06-06 DIAGNOSIS — D508 Other iron deficiency anemias: Secondary | ICD-10-CM

## 2016-06-06 DIAGNOSIS — R944 Abnormal results of kidney function studies: Secondary | ICD-10-CM | POA: Diagnosis not present

## 2016-06-06 DIAGNOSIS — E890 Postprocedural hypothyroidism: Secondary | ICD-10-CM

## 2016-06-06 NOTE — Patient Instructions (Signed)
A few things to remember from today's visit:   Abnormal renal function test - Plan: Basic metabolic panel, Microalbumin / creatinine urine ratio  Postablative hypothyroidism - Plan: TSH, T4, free  Low salt diet.    Please be sure medication list is accurate. If a new problem present, please set up appointment sooner than planned today.

## 2016-06-06 NOTE — Progress Notes (Signed)
Pre visit review using our clinic review tool, if applicable. No additional management support is needed unless otherwise documented below in the visit note. 

## 2016-06-06 NOTE — Progress Notes (Signed)
HPI:   [name] is a 56 y.o. female, who is here today to establish care.  Former PCP: Dr Sherren Mocha Last preventive routine visit: 10/2015  Chronic medical problems:   Iron deficiency anemia,she follows with hematologist.Currently she is on OTC "Energizing iron" 2 tabs daily.  Lab Results  Component Value Date   WBC 3.6 (L) 03/01/2016   HGB 10.1 (L) 03/01/2016   HCT 32.8 (L) 03/01/2016   MCV 95 03/01/2016   PLT 188 03/01/2016    Depression: Follows with Dr Dorethea Clan psych, last appt 05/2016 and next in 01/2017.   Hypothyroidism: S/P  Currently she is on Levothyroxine 125 mcg daily. Tolerating medication well, no side effects reported. She has not noted dysphagia, palpitations, abdominal pain, changes in bowel habits, tremor, cold/heat intolerance, or abnormal weight loss.  Lab Results  Component Value Date   TSH 1.34 10/04/2015    Abnormal renal function test for the past 7 months. No Hx of HTN or DM. She denies frequent OTC NSAID's intake.  High Na++ diet,eats out weekends.  HypoK+: She is currently on KCL 20 meq daily, started in 10/2015. She reports years Hx of hypoK+, not sure about etiology. Denies frequent intake of lacerate candy,diuretic use,or alchol abuse. She eats once daily most of the time because work schedule,she has 2 jobs.   Lab Results  Component Value Date   CREATININE 1.14 (H) 03/01/2016   BUN 14 03/01/2016   NA 135 03/01/2016   K 4.2 03/01/2016   CL 102 03/01/2016   CO2 26 03/01/2016     Review of Systems  Constitutional: Negative for activity change, appetite change, fatigue, fever and unexpected weight change.  HENT: Negative for mouth sores, nosebleeds and trouble swallowing.   Eyes: Negative for redness and visual disturbance.  Respiratory: Negative for cough, shortness of breath and wheezing.   Cardiovascular: Negative for chest pain, palpitations and leg swelling.  Gastrointestinal: Negative for abdominal pain, nausea  and vomiting.       Negative for changes in bowel habits.  Endocrine: Negative for cold intolerance and heat intolerance.  Genitourinary: Negative for decreased urine volume and hematuria.  Musculoskeletal: Negative for gait problem and myalgias.  Skin: Negative for pallor and rash.  Neurological: Negative for syncope, weakness and headaches.  Psychiatric/Behavioral: Negative for confusion, hallucinations and sleep disturbance.      Current Outpatient Prescriptions on File Prior to Visit  Medication Sig Dispense Refill  . escitalopram (LEXAPRO) 20 MG tablet Take 20 mg by mouth daily.      Marland Kitchen levothyroxine (SYNTHROID, LEVOTHROID) 125 MCG tablet TAKE 1 TABLET (125 MCG TOTAL) BY MOUTH DAILY. 100 tablet 3  . omeprazole (PRILOSEC) 20 MG capsule TAKE 1 CAPSULE (20 MG TOTAL) BY MOUTH DAILY. 90 capsule 3  . OVER THE COUNTER MEDICATION     . potassium chloride SA (K-DUR,KLOR-CON) 20 MEQ tablet Take 1 tablet (20 mEq total) by mouth 1 day or 1 dose. 100 tablet 3   No current facility-administered medications on file prior to visit.      Past Medical History:  Diagnosis Date  . Anemia   . Depression   . DUB (dysfunctional uterine bleeding)   . GERD (gastroesophageal reflux disease)   . Hypothyroidism    Allergies  Allergen Reactions  . Milk-Related Compounds     Intolerance to milk products-causes gas    Family History  Problem Relation Age of Onset  . Hypertension Mother   . Asthma Mother   .  Cancer Father 56  . Colon cancer Neg Hx     Social History   Social History  . Marital status: Married    Spouse name: N/A  . Number of children: 0  . Years of education: N/A   Occupational History  .  [employer]   Social History Main Topics  . Smoking status: Former Smoker    Packs/day: 1.00    Years: 20.00    Types: Cigarettes    Start date: 03/13/1967    Quit date: 07/14/1987  . Smokeless tobacco: Never Used     Comment: quit 26 years ago  . Alcohol use No  . Drug use: No  .  Sexual activity: Not Asked   Other Topics Concern  . None   Social History Narrative   Occupation: Freight forwarder   Regular exercise- yes   0 caffeine drinks     Vitals:   06/06/16 1522  BP: 104/70  Pulse: 63  Resp: 12   Body mass index is 26.88 kg/m.  Physical Exam  Nursing note and vitals reviewed. Constitutional: She is oriented to person, place, and time. She appears well-developed. No distress.  HENT:  Head: Atraumatic.  Mouth/Throat: Oropharynx is clear and moist and mucous membranes are normal.  Eyes: Conjunctivae and EOM are normal. Pupils are equal, round, and reactive to light.  Neck: No tracheal deviation present. No thyroid mass and no thyromegaly present.  Cardiovascular: Normal rate and regular rhythm.   No murmur heard. Pulses:      Dorsalis pedis pulses are 2+ on the right side, and 2+ on the left side.  Respiratory: Effort normal and breath sounds normal. No respiratory distress.  GI: Soft. She exhibits no mass. There is no hepatomegaly. There is no tenderness.  Musculoskeletal: She exhibits no edema.  Lymphadenopathy:    She has no cervical adenopathy.  Neurological: She is alert and oriented to person, place, and time. She has normal strength. Coordination and gait normal.  Skin: Skin is warm. No erythema.  Psychiatric: She has a normal mood and affect.  Well groomed, good eye contact.      ASSESSMENT AND PLAN:   Karen [name] was seen today for establish care.  Diagnoses and all orders for this visit:  Abnormal renal function test  Further recommendations will be given according to lab results. Avoid NSAID's and follow a low salt diet recommended.   -     Basic metabolic panel -     Microalbumin / creatinine urine ratio  Postablative hypothyroidism  No changes in current management, will follow labs done today and will give further recommendations accordingly. F/U in 6-12 months.  -     TSH -     T4, free  Hypokalemia  No changes  in current management, will follow labs done today and will give further recommendations accordingly.  -     Basic metabolic panel  Depression, unspecified depression type  Stable, reported as well controlled with current regimen. Continue following with Dr Benard Rink.  Other iron deficiency anemia  Asymptomatic. She has an appt with Dr Martha Clan 06/28/16.   Karen G. [name], MD  [facility]. [place] office.

## 2016-06-07 LAB — BASIC METABOLIC PANEL
BUN: 10 mg/dL (ref 6–23)
CALCIUM: 9.6 mg/dL (ref 8.4–10.5)
CO2: 26 mEq/L (ref 19–32)
CREATININE: 1.25 mg/dL — AB (ref 0.40–1.20)
Chloride: 108 mEq/L (ref 96–112)
GFR: 56.8 mL/min — AB (ref >60.00)
GLUCOSE: 91 mg/dL (ref 70–99)
POTASSIUM: 4 meq/L (ref 3.5–5.1)
Sodium: 144 mEq/L (ref 135–145)

## 2016-06-07 LAB — MICROALBUMIN / CREATININE URINE RATIO
Creatinine,U: 184 mg/dL
Microalb Creat Ratio: 0.4 mg/g (ref 0.0–30.0)
Microalb, Ur: 0.7 mg/dL (ref 0.0–1.9)

## 2016-06-07 LAB — TSH: TSH: 0.68 u[IU]/mL (ref 0.35–4.50)

## 2016-06-07 LAB — T4, FREE: FREE T4: 0.74 ng/dL (ref 0.60–1.60)

## 2016-06-28 ENCOUNTER — Ambulatory Visit (HOSPITAL_BASED_OUTPATIENT_CLINIC_OR_DEPARTMENT_OTHER): Payer: BLUE CROSS/BLUE SHIELD

## 2016-06-28 ENCOUNTER — Other Ambulatory Visit (HOSPITAL_BASED_OUTPATIENT_CLINIC_OR_DEPARTMENT_OTHER): Payer: BLUE CROSS/BLUE SHIELD

## 2016-06-28 ENCOUNTER — Ambulatory Visit (HOSPITAL_BASED_OUTPATIENT_CLINIC_OR_DEPARTMENT_OTHER): Payer: BLUE CROSS/BLUE SHIELD | Admitting: Hematology & Oncology

## 2016-06-28 VITALS — BP 109/68 | HR 62 | Temp 97.8°F | Resp 16 | Wt 185.1 lb

## 2016-06-28 DIAGNOSIS — N189 Chronic kidney disease, unspecified: Secondary | ICD-10-CM

## 2016-06-28 DIAGNOSIS — D508 Other iron deficiency anemias: Secondary | ICD-10-CM

## 2016-06-28 DIAGNOSIS — D509 Iron deficiency anemia, unspecified: Secondary | ICD-10-CM | POA: Diagnosis not present

## 2016-06-28 DIAGNOSIS — N183 Chronic kidney disease, stage 3 unspecified: Secondary | ICD-10-CM

## 2016-06-28 DIAGNOSIS — D631 Anemia in chronic kidney disease: Secondary | ICD-10-CM

## 2016-06-28 DIAGNOSIS — D500 Iron deficiency anemia secondary to blood loss (chronic): Secondary | ICD-10-CM

## 2016-06-28 DIAGNOSIS — D649 Anemia, unspecified: Secondary | ICD-10-CM

## 2016-06-28 LAB — CBC WITH DIFFERENTIAL (CANCER CENTER ONLY)
BASO#: 0 10*3/uL (ref 0.0–0.2)
BASO%: 0 % (ref 0.0–2.0)
EOS%: 0.7 % (ref 0.0–7.0)
Eosinophils Absolute: 0 10*3/uL (ref 0.0–0.5)
HEMATOCRIT: 30.3 % — AB (ref 34.8–46.6)
HEMOGLOBIN: 9.5 g/dL — AB (ref 11.6–15.9)
LYMPH#: 1.4 10*3/uL (ref 0.9–3.3)
LYMPH%: 32.8 % (ref 14.0–48.0)
MCH: 29.3 pg (ref 26.0–34.0)
MCHC: 31.4 g/dL — AB (ref 32.0–36.0)
MCV: 94 fL (ref 81–101)
MONO#: 0.3 10*3/uL (ref 0.1–0.9)
MONO%: 8.1 % (ref 0.0–13.0)
NEUT%: 58.4 % (ref 39.6–80.0)
NEUTROS ABS: 2.5 10*3/uL (ref 1.5–6.5)
Platelets: 209 10*3/uL (ref 145–400)
RBC: 3.24 10*6/uL — AB (ref 3.70–5.32)
RDW: 12.8 % (ref 11.1–15.7)
WBC: 4.2 10*3/uL (ref 3.9–10.0)

## 2016-06-28 LAB — COMPREHENSIVE METABOLIC PANEL (CC13)
ALT: 8 IU/L (ref 0–32)
AST (SGOT): 15 IU/L (ref 0–40)
Albumin, Serum: 4.2 g/dL (ref 3.5–5.5)
Albumin/Globulin Ratio: 1.4 (ref 1.2–2.2)
Alkaline Phosphatase, S: 81 IU/L (ref 39–117)
BUN / CREAT RATIO: 11 (ref 9–23)
BUN: 13 mg/dL (ref 6–24)
Bilirubin Total: 0.4 mg/dL (ref 0.0–1.2)
CALCIUM: 8.9 mg/dL (ref 8.7–10.2)
CO2: 25 mmol/L (ref 18–29)
CREATININE: 1.15 mg/dL — AB (ref 0.57–1.00)
Chloride, Ser: 105 mmol/L (ref 96–106)
GFR calc Af Amer: 61 mL/min/{1.73_m2} (ref >59)
GFR calc non Af Amer: 53 mL/min/{1.73_m2} — ABNORMAL LOW (ref >59)
GLOBULIN, TOTAL: 2.9 g/dL (ref 1.5–4.5)
GLUCOSE: 87 mg/dL (ref 65–99)
Potassium, Ser: 3.5 mmol/L (ref 3.5–5.2)
SODIUM: 136 mmol/L (ref 134–144)
TOTAL PROTEIN: 7.1 g/dL (ref 6.0–8.5)

## 2016-06-28 MED ORDER — DARBEPOETIN ALFA 300 MCG/0.6ML IJ SOSY
300.0000 ug | PREFILLED_SYRINGE | Freq: Once | INTRAMUSCULAR | Status: AC
  Administered 2016-06-28: 300 ug via SUBCUTANEOUS

## 2016-06-28 MED ORDER — DARBEPOETIN ALFA 300 MCG/0.6ML IJ SOSY: 300.0000 ug | PREFILLED_SYRINGE | Freq: Once | INTRAMUSCULAR | Status: DC

## 2016-06-28 MED ORDER — DARBEPOETIN ALFA 300 MCG/0.6ML IJ SOSY
PREFILLED_SYRINGE | INTRAMUSCULAR | Status: AC
  Filled 2016-06-28: qty 0.6

## 2016-06-28 NOTE — Patient Instructions (Signed)
Darbepoetin Alfa injection What is this medicine? DARBEPOETIN ALFA (dar be POE e tin AL fa) helps your body make more red blood cells. It is used to treat anemia caused by chronic kidney failure and chemotherapy. This medicine may be used for other purposes; ask your health care provider or pharmacist if you have questions. What should I tell my health care provider before I take this medicine? They need to know if you have any of these conditions: -blood clotting disorders or history of blood clots -cancer patient not on chemotherapy -cystic fibrosis -heart disease, such as angina, heart failure, or a history of a heart attack -hemoglobin level of 12 g/dL or greater -high blood pressure -low levels of folate, iron, or vitamin B12 -seizures -an unusual or allergic reaction to darbepoetin, erythropoietin, albumin, hamster proteins, latex, other medicines, foods, dyes, or preservatives -pregnant or trying to get pregnant -breast-feeding How should I use this medicine? This medicine is for injection into a vein or under the skin. It is usually given by a health care professional in a hospital or clinic setting. If you get this medicine at home, you will be taught how to prepare and give this medicine. Do not shake the solution before you withdraw a dose. Use exactly as directed. Take your medicine at regular intervals. Do not take your medicine more often than directed. It is important that you put your used needles and syringes in a special sharps container. Do not put them in a trash can. If you do not have a sharps container, call your pharmacist or healthcare provider to get one. Talk to your pediatrician regarding the use of this medicine in children. While this medicine may be used in children as young as 1 year for selected conditions, precautions do apply. Overdosage: If you think you have taken too much of this medicine contact a poison control center or emergency room at once. NOTE:  This medicine is only for you. Do not share this medicine with others. What if I miss a dose? If you miss a dose, take it as soon as you can. If it is almost time for your next dose, take only that dose. Do not take double or extra doses. What may interact with this medicine? Do not take this medicine with any of the following medications: -epoetin alfa This list may not describe all possible interactions. Give your health care provider a list of all the medicines, herbs, non-prescription drugs, or dietary supplements you use. Also tell them if you smoke, drink alcohol, or use illegal drugs. Some items may interact with your medicine. What should I watch for while using this medicine? Visit your prescriber or health care professional for regular checks on your progress and for the needed blood tests and blood pressure measurements. It is especially important for the doctor to make sure your hemoglobin level is in the desired range, to limit the risk of potential side effects and to give you the best benefit. Keep all appointments for any recommended tests. Check your blood pressure as directed. Ask your doctor what your blood pressure should be and when you should contact him or her. As your body makes more red blood cells, you may need to take iron, folic acid, or vitamin B supplements. Ask your doctor or health care provider which products are right for you. If you have kidney disease continue dietary restrictions, even though this medication can make you feel better. Talk with your doctor or health care professional about the   foods you eat and the vitamins that you take. What side effects may I notice from receiving this medicine? Side effects that you should report to your doctor or health care professional as soon as possible: -allergic reactions like skin rash, itching or hives, swelling of the face, lips, or tongue -breathing problems -changes in vision -chest pain -confusion, trouble speaking  or understanding -feeling faint or lightheaded, falls -high blood pressure -muscle aches or pains -pain, swelling, warmth in the leg -rapid weight gain -severe headaches -sudden numbness or weakness of the face, arm or leg -trouble walking, dizziness, loss of balance or coordination -seizures (convulsions) -swelling of the ankles, feet, hands -unusually weak or tired Side effects that usually do not require medical attention (report to your doctor or health care professional if they continue or are bothersome): -diarrhea -fever, chills (flu-like symptoms) -headaches -nausea, vomiting -redness, stinging, or swelling at site where injected This list may not describe all possible side effects. Call your doctor for medical advice about side effects. You may report side effects to FDA at 1-800-FDA-1088. Where should I keep my medicine? Keep out of the reach of children. Store in a refrigerator between 2 and 8 degrees C (36 and 46 degrees F). Do not freeze. Do not shake. Throw away any unused portion if using a single-dose vial. Throw away any unused medicine after the expiration date. NOTE: This sheet is a summary. It may not cover all possible information. If you have questions about this medicine, talk to your doctor, pharmacist, or health care provider.    2016, [employer] Standard. (2008-01-05 10:23:57)  

## 2016-06-28 NOTE — Progress Notes (Signed)
Hematology and Oncology Follow Up Visit  Karen [name] [id number] 12/12/1960 56 y.o. 06/28/2016   Principle Diagnosis:   Anemia of renal insufficiency       Intermittent iron deficiency anemia   Current Therapy:   Aranesp 300 mcg subcutaneous for hemoglobin less than 10 - dose given on 06/28/2016 IV iron as indicated      Interim History:  [name] is back for followup. She really looks good. She is off today. She is getting ready for the [facility] Day weekend.  She works for the [employer]. She works in the Teacher, early years/pre.  She has had no problems with bleeding. She's had no problems with significant fatigue or weakness.  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  She's had no fever. She's had no rashes P she's had no leg swelling.   She's had no cough. There's been no bleeding. She's had no shortness of breath.  Her mammogram was done in January 2018. Everything looked fine. No suspicious microcalcifications.  Overall, her performance status is ECOG 1.     Medications:  Current Outpatient Prescriptions:  .  buPROPion (WELLBUTRIN XL) 150 MG 24 hr tablet, Take 150 mg by mouth daily., Disp: , Rfl:  .  escitalopram (LEXAPRO) 20 MG tablet, Take 20 mg by mouth daily.  , Disp: , Rfl:  .  haloperidol (HALDOL) 2 MG tablet, Take 2 mg by mouth daily., Disp: , Rfl:  .  KLOR-CON M20 20 MEQ tablet, Take 20 mEq by mouth daily., Disp: , Rfl: 3 .  levothyroxine (SYNTHROID, LEVOTHROID) 125 MCG tablet, TAKE 1 TABLET (125 MCG TOTAL) BY MOUTH DAILY., Disp: 100 tablet, Rfl: 3 .  omeprazole (PRILOSEC) 20 MG capsule, TAKE 1 CAPSULE (20 MG TOTAL) BY MOUTH DAILY., Disp: 90 capsule, Rfl: 3 .  OVER THE COUNTER MEDICATION, , Disp: , Rfl:  .  potassium chloride SA (K-DUR,KLOR-CON) 20 MEQ tablet, Take 1 tablet (20 mEq total) by mouth 1 day or 1 dose., Disp: 100 tablet, Rfl: 3 No current facility-administered medications for this visit.    Facility-Administered Medications Ordered in Other Visits:  .  Darbepoetin Alfa (ARANESP) injection 300 mcg, 300 mcg, Subcutaneous, Once, Orion Mole, Rudell Cobb, MD  Allergies:  Allergies  Allergen Reactions  . Milk-Related Compounds     Intolerance to milk products-causes gas    Past Medical History, Surgical history, Social history, and Family History were reviewed and updated.  Review of Systems: As above  Physical Exam:  weight is 185 lb 1.9 oz (84 kg). Her oral temperature is 97.8 F (36.6 C). Her blood pressure is 109/68 and her pulse is 62. Her respiration is 16 and oxygen saturation is 100%.   Well-developed and well-nourished [name]. There are no ocular or oral lesions. There is no adenopathy in the neck. Lungs are clear. Cardiac exam regular rate and rhythm with no murmurs, rubs or bruits. Abdomen is soft. She has good bowel sounds. There is no fluid wave. There is no palpable liver or spleen. Extremities shows no clubbing cyanosis or edema. She has good range of motion of her joints. She has good muscle strength. Skin no rashes, ecchymoses or petechia. Neurological exam is nonfocal.  Lab Results  Component Value Date   WBC 4.2 06/28/2016   HGB 9.5 (L) 06/28/2016   HCT 30.3 (L) 06/28/2016   MCV 94 06/28/2016   PLT 209 06/28/2016     Chemistry  Component Value Date/Time   NA 144 06/06/2016 1609   NA 135 03/01/2016 1501   NA 139 06/16/2014 0954   K 4.0 06/06/2016 1609   K 4.2 03/01/2016 1501   K 3.6 06/16/2014 0954   CL 108 06/06/2016 1609   CL 102 03/01/2016 1501   CL 107 06/16/2014 0954   CO2 26 06/06/2016 1609   CO2 26 03/01/2016 1501   CO2 28 06/16/2014 0954   BUN 10 06/06/2016 1609   BUN 14 03/01/2016 1501   BUN 12 06/16/2014 0954   CREATININE 1.25 (H) 06/06/2016 1609   CREATININE 1.14 (H) 03/01/2016 1501   CREATININE 1.4 (H) 06/16/2014 0954      Component Value Date/Time   CALCIUM 9.6 06/06/2016 1609   CALCIUM 9.6 03/01/2016 1501    CALCIUM 9.3 06/16/2014 0954   ALKPHOS 90 03/01/2016 1501   ALKPHOS 72 06/16/2014 0954   AST 13 03/01/2016 1501   AST 19 06/16/2014 0954   ALT 6 03/01/2016 1501   ALT 13 06/16/2014 0954   BILITOT 0.3 03/01/2016 1501   BILITOT 1.10 06/16/2014 0954         Impression and Plan: Karen [name] is 56 year-old [place] female. She has multifactorial anemia.   We will go ahead and give her dose of Aranesp. I think this will help her out and get her through the summertime. It sounds like she will be quite busy. I don't want her having significant fatigue or weakness.  We will plan to get her back in 4 months. We will get her back after Labor Day. I think she should do well.  We will see what her iron studies are. They have been good for the past couple years.Volanda Napoleon, MD 5/25/20184:15 PM

## 2016-06-29 LAB — RETICULOCYTES: Reticulocyte Count: 1.4 % (ref 0.6–2.6)

## 2016-07-02 LAB — IRON AND TIBC
%SAT: 18 % — ABNORMAL LOW (ref 21–57)
IRON: 49 ug/dL (ref 41–142)
TIBC: 266 ug/dL (ref 236–444)
UIBC: 218 ug/dL (ref 120–384)

## 2016-07-02 LAB — FERRITIN: Ferritin: 194 ng/ml (ref 9–269)

## 2016-07-03 ENCOUNTER — Telehealth: Payer: Self-pay | Admitting: *Deleted

## 2016-07-03 NOTE — Telephone Encounter (Addendum)
Patient is aware of results. She will call back to schedule   ----- Message from Eliezer Bottom, NP sent at 07/02/2016  8:57 PM EDT ----- Regarding: Iron  Iron studies are low. She will need one dose of IV iron next week please. LOS sent to [facility]. Thank you!  Sarah  ----- Message ----- From: Interface, Lab In Three Zero One Sent: 06/28/2016   3:31 PM To: Eliezer Bottom, NP

## 2016-07-18 ENCOUNTER — Other Ambulatory Visit: Payer: Self-pay | Admitting: Family

## 2016-07-18 ENCOUNTER — Ambulatory Visit (HOSPITAL_BASED_OUTPATIENT_CLINIC_OR_DEPARTMENT_OTHER): Payer: BLUE CROSS/BLUE SHIELD

## 2016-07-18 VITALS — BP 112/76 | HR 65 | Temp 98.3°F | Resp 16 | Wt 180.0 lb

## 2016-07-18 DIAGNOSIS — D500 Iron deficiency anemia secondary to blood loss (chronic): Secondary | ICD-10-CM

## 2016-07-18 DIAGNOSIS — D509 Iron deficiency anemia, unspecified: Secondary | ICD-10-CM

## 2016-07-18 DIAGNOSIS — D631 Anemia in chronic kidney disease: Secondary | ICD-10-CM

## 2016-07-18 DIAGNOSIS — N189 Chronic kidney disease, unspecified: Secondary | ICD-10-CM

## 2016-07-18 MED ORDER — FERUMOXYTOL INJECTION 510 MG/17 ML
510.0000 mg | Freq: Once | INTRAVENOUS | Status: AC
  Administered 2016-07-18: 510 mg via INTRAVENOUS
  Filled 2016-07-18: qty 17

## 2016-07-18 MED ORDER — SODIUM CHLORIDE 0.9% FLUSH
3.0000 mL | Freq: Once | INTRAVENOUS | Status: DC | PRN
Start: 2016-07-18 — End: 2016-07-18
  Filled 2016-07-18: qty 10

## 2016-07-18 MED ORDER — SODIUM CHLORIDE 0.9% FLUSH
10.0000 mL | INTRAVENOUS | Status: DC | PRN
  Filled 2016-07-18: qty 10

## 2016-07-18 MED ORDER — SODIUM CHLORIDE 0.9 % IV SOLN
Freq: Once | INTRAVENOUS | Status: AC
  Administered 2016-07-18: 14:00:00 via INTRAVENOUS

## 2016-07-18 NOTE — Patient Instructions (Signed)

## 2016-10-24 ENCOUNTER — Other Ambulatory Visit: Payer: Self-pay | Admitting: Family Medicine

## 2016-11-01 ENCOUNTER — Other Ambulatory Visit: Payer: BLUE CROSS/BLUE SHIELD

## 2016-11-01 ENCOUNTER — Other Ambulatory Visit (HOSPITAL_BASED_OUTPATIENT_CLINIC_OR_DEPARTMENT_OTHER): Payer: BLUE CROSS/BLUE SHIELD

## 2016-11-01 ENCOUNTER — Ambulatory Visit: Payer: BLUE CROSS/BLUE SHIELD

## 2016-11-01 ENCOUNTER — Ambulatory Visit: Payer: BLUE CROSS/BLUE SHIELD | Admitting: Family

## 2016-11-01 DIAGNOSIS — D509 Iron deficiency anemia, unspecified: Secondary | ICD-10-CM | POA: Diagnosis not present

## 2016-11-01 DIAGNOSIS — N183 Chronic kidney disease, stage 3 unspecified: Secondary | ICD-10-CM

## 2016-11-01 DIAGNOSIS — N189 Chronic kidney disease, unspecified: Secondary | ICD-10-CM

## 2016-11-01 DIAGNOSIS — D631 Anemia in chronic kidney disease: Secondary | ICD-10-CM

## 2016-11-01 LAB — CBC WITH DIFFERENTIAL (CANCER CENTER ONLY)
BASO#: 0 10*3/uL (ref 0.0–0.2)
BASO%: 0.3 % (ref 0.0–2.0)
EOS%: 0.8 % (ref 0.0–7.0)
Eosinophils Absolute: 0 10*3/uL (ref 0.0–0.5)
HCT: 30.3 % — ABNORMAL LOW (ref 34.8–46.6)
HGB: 9.7 g/dL — ABNORMAL LOW (ref 11.6–15.9)
LYMPH#: 1.4 10*3/uL (ref 0.9–3.3)
LYMPH%: 39.5 % (ref 14.0–48.0)
MCH: 30.6 pg (ref 26.0–34.0)
MCHC: 32 g/dL (ref 32.0–36.0)
MCV: 96 fL (ref 81–101)
MONO#: 0.4 10*3/uL (ref 0.1–0.9)
MONO%: 9.8 % (ref 0.0–13.0)
NEUT#: 1.8 10*3/uL (ref 1.5–6.5)
NEUT%: 49.6 % (ref 39.6–80.0)
PLATELETS: 233 10*3/uL (ref 145–400)
RBC: 3.17 10*6/uL — AB (ref 3.70–5.32)
RDW: 13.4 % (ref 11.1–15.7)
WBC: 3.6 10*3/uL — AB (ref 3.9–10.0)

## 2016-11-01 LAB — CMP (CANCER CENTER ONLY)
ALK PHOS: 89 U/L — AB (ref 26–84)
ALT: 16 U/L (ref 10–47)
AST: 20 U/L (ref 11–38)
Albumin: 3.7 g/dL (ref 3.3–5.5)
BILIRUBIN TOTAL: 0.8 mg/dL (ref 0.20–1.60)
BUN: 8 mg/dL (ref 7–22)
CALCIUM: 8.8 mg/dL (ref 8.0–10.3)
CO2: 29 mEq/L (ref 18–33)
Chloride: 109 mEq/L — ABNORMAL HIGH (ref 98–108)
Creat: 1.4 mg/dl — ABNORMAL HIGH (ref 0.6–1.2)
Glucose, Bld: 79 mg/dL (ref 73–118)
POTASSIUM: 3.4 meq/L (ref 3.3–4.7)
Sodium: 141 mEq/L (ref 128–145)
TOTAL PROTEIN: 7.3 g/dL (ref 6.4–8.1)

## 2016-11-02 LAB — RETICULOCYTES: Reticulocyte Count: 1.5 % (ref 0.6–2.6)

## 2016-11-04 LAB — IRON AND TIBC
%SAT: 26 % (ref 21–57)
Iron: 62 ug/dL (ref 41–142)
TIBC: 236 ug/dL (ref 236–444)
UIBC: 175 ug/dL (ref 120–384)

## 2016-11-04 LAB — FERRITIN: FERRITIN: 389 ng/mL — AB (ref 9–269)

## 2016-11-06 ENCOUNTER — Other Ambulatory Visit: Payer: Self-pay | Admitting: Family Medicine

## 2016-11-06 NOTE — Telephone Encounter (Signed)
Dr. Jordan patient 

## 2016-11-08 ENCOUNTER — Ambulatory Visit (HOSPITAL_BASED_OUTPATIENT_CLINIC_OR_DEPARTMENT_OTHER): Payer: BLUE CROSS/BLUE SHIELD | Admitting: Family

## 2016-11-08 ENCOUNTER — Ambulatory Visit (HOSPITAL_BASED_OUTPATIENT_CLINIC_OR_DEPARTMENT_OTHER): Payer: BLUE CROSS/BLUE SHIELD

## 2016-11-08 VITALS — BP 135/76 | HR 76 | Temp 98.2°F | Resp 18 | Wt 189.0 lb

## 2016-11-08 DIAGNOSIS — D500 Iron deficiency anemia secondary to blood loss (chronic): Secondary | ICD-10-CM

## 2016-11-08 DIAGNOSIS — D631 Anemia in chronic kidney disease: Secondary | ICD-10-CM

## 2016-11-08 DIAGNOSIS — D509 Iron deficiency anemia, unspecified: Secondary | ICD-10-CM | POA: Diagnosis not present

## 2016-11-08 DIAGNOSIS — N189 Chronic kidney disease, unspecified: Secondary | ICD-10-CM

## 2016-11-08 MED ORDER — DARBEPOETIN ALFA 300 MCG/0.6ML IJ SOSY
PREFILLED_SYRINGE | INTRAMUSCULAR | Status: AC
  Filled 2016-11-08: qty 0.6

## 2016-11-08 MED ORDER — DARBEPOETIN ALFA 300 MCG/0.6ML IJ SOSY
300.0000 ug | PREFILLED_SYRINGE | Freq: Once | INTRAMUSCULAR | Status: AC
  Administered 2016-11-08: 300 ug via SUBCUTANEOUS

## 2016-11-08 NOTE — Patient Instructions (Signed)

## 2016-11-08 NOTE — Progress Notes (Signed)
Hematology and Oncology Follow Up Visit  Karen [name] [id number] 1960/06/25 56 y.o. 11/08/2016   Principle Diagnosis:  Anemia of renal insufficiency Intermittent iron deficiency anemia   Current Therapy:   Aranesp 300 mcg subcutaneous for hemoglobin less than 10 - dose given on 06/28/2016 IV iron as indicated        Interim History: Karen [name] is here today for follow-up. She is doing well and staying busy at work in a [employer]. She has no complaints at this time.  No episodes of bleeding, bruising or petechiae. No lymphadenopathy found on exam.  Iron studies are stable. Hgb is 9.7 so we will give her Aranesp today.  She has had no fever, chills, chewing ice, n/v, cough, rash dizziness, SOB, chest pain, palpitations, abdominal pain or changes in bowel or bladder habits.  No swelling, tenderness, numbness or tingling in her extremities. No c/o pain.  She has maintained a good appetite and is staying well hydrated. Her weight is stable.   ECOG Performance Status: 1 - Symptomatic but completely ambulatory  Medications:  Allergies as of 11/08/2016      Reactions   Milk-related Compounds    Intolerance to milk products-causes gas      Medication List       Accurate as of 11/08/16  3:51 PM. Always use your most recent med list.          buPROPion 150 MG 24 hr tablet Commonly known as:  WELLBUTRIN XL Take 150 mg by mouth daily.   escitalopram 20 MG tablet Commonly known as:  LEXAPRO Take 20 mg by mouth daily.   haloperidol 2 MG tablet Commonly known as:  HALDOL Take 2 mg by mouth daily.   KLOR-CON M20 20 MEQ tablet Generic drug:  potassium chloride SA Take 20 mEq by mouth daily.   KLOR-CON M20 20 MEQ tablet Generic drug:  potassium chloride SA TAKE 1 TABLET BY MOUTH ONCE A DAY   levothyroxine 125 MCG tablet Commonly known as:  SYNTHROID, LEVOTHROID TAKE 1 TABLET (125 MCG TOTAL) BY MOUTH DAILY.   omeprazole 20 MG capsule Commonly known as:   PRILOSEC TAKE 1 CAPSULE BY MOUTH DAILY   OVER THE COUNTER MEDICATION       Allergies:  Allergies  Allergen Reactions  . Milk-Related Compounds     Intolerance to milk products-causes gas    Past Medical History, Surgical history, Social history, and Family History were reviewed and updated.  Review of Systems: All other 10 point review of systems is negative.   Physical Exam:  vitals were not taken for this visit.  Wt Readings from Last 3 Encounters:  07/18/16 180 lb (81.6 kg)  06/28/16 185 lb 1.9 oz (84 kg)  06/06/16 182 lb (82.6 kg)    Ocular: Sclerae unicteric, pupils equal, round and reactive to light Ear-nose-throat: Oropharynx clear, dentition fair Lymphatic: No cervical, supraclavicular or axillary adenopathy Lungs no rales or rhonchi, good excursion bilaterally Heart regular rate and rhythm, no murmur appreciated Abd soft, nontender, positive bowel sounds, no liver or spleen tip palpated on exam, no fluid wave  MSK no focal spinal tenderness, no joint edema Neuro: non-focal, well-oriented, appropriate affect Breasts: Deferred   Lab Results  Component Value Date   WBC 3.6 (L) 11/01/2016   HGB 9.7 (L) 11/01/2016   HCT 30.3 (L) 11/01/2016   MCV 96 11/01/2016   PLT 233 11/01/2016   Lab Results  Component Value Date   FERRITIN 389 (H) 11/01/2016  IRON 62 11/01/2016   TIBC 236 11/01/2016   UIBC 175 11/01/2016   IRONPCTSAT 26 11/01/2016   Lab Results  Component Value Date   RETICCTPCT 0.8 11/18/2014   RBC 3.17 (L) 11/01/2016   RETICCTABS 26.4 11/18/2014   No results found for: KPAFRELGTCHN, LAMBDASER, KAPLAMBRATIO No results found for: IGGSERUM, IGA, IGMSERUM No results found for: Ronnald Ramp, A1GS, A2GS, Tillman Sers, SPEI   Chemistry      Component Value Date/Time   NA 141 11/01/2016 1504   K 3.4 11/01/2016 1504   CL 109 (H) 11/01/2016 1504   CO2 29 11/01/2016 1504   BUN 8 11/01/2016 1504   CREATININE 1.4 (H)  11/01/2016 1504      Component Value Date/Time   CALCIUM 8.8 11/01/2016 1504   ALKPHOS 89 (H) 11/01/2016 1504   AST 20 11/01/2016 1504   ALT 16 11/01/2016 1504   BILITOT 0.80 11/01/2016 1504      Impression and Plan: Karen [name] is a very pleasant 56 yo [name] with multifactorial anemia, iron deficiency and chronic renal insufficiency. Hgb is 9.7 so we will give her Aranesp. Iron studies are stable so no infusion needed.  We will plan to see her back again in another 4 months for repeat lab work and follow-up.  She will contact our office with any questions or concerns. We can certainly see her sooner if needed.   Eliezer Bottom, NP 10/5/20183:51 PM

## 2016-11-21 NOTE — Progress Notes (Signed)
HPI:   [name] is a 56 y.o. female, who is here today for her routine physical.  Last CPE: 10/2015.  Regular exercise 3 or more time per week: No Following a healthy diet: Not consistently. High salt intake. She lives with her husband.  Chronic medical problems: CKD III,anemia, hypothyroidism, depression (follows with Dr Dorethea Clan), hypoK+ on KLOR 20 meq daily.   She has Hx of iron deficiency anemia and CKD III. No Hx of HTN or DM II. She did take NSAID's for some time to treat knee OA. She denies gross hematuria,foam in urine,or decreased urine output.  Lab Results  Component Value Date   CREATININE 1.4 (H) 11/01/2016   BUN 8 11/01/2016   NA 141 11/01/2016   K 3.4 11/01/2016   CL 109 (H) 11/01/2016   CO2 29 11/01/2016    She follows with oncologist, Dr Martha Clan. Receiving Aranesp, she has also received Iron IV treatment in the past.  Pap smear 10/2015 Hx of abnormal pap smears: Denies Hx of STD's: Denies  Immunization History  Administered Date(s) Administered  . Influenza Split 11/06/2010, 12/18/2011  . Influenza Whole 10/22/2007, 10/28/2008, 11/02/2009  . Influenza,inj,Quad PF,6+ Mos 11/25/2012, 12/15/2013, 10/11/2015  . PPD Test 08/22/2014  . Td 02/04/1998, 10/28/2008    Mammogram: 02/22/16 [facility] 1 Colonoscopy: 11/2009.  Eye exam periodically, she has an appt today, Hx of bilateral eye glaucoma (open angle).  Hep C screening: Never.  Hypothyroidism: S/P iodine ablation due to hyperthyroidism.  Lab Results  Component Value Date   TSH 0.68 06/06/2016    Review of Systems  Constitutional: Negative for appetite change, fatigue, fever and unexpected weight change.  HENT: Negative for hearing loss, mouth sores, trouble swallowing and voice change.   Eyes: Negative for redness and visual disturbance.  Respiratory: Negative for cough, shortness of breath and wheezing.   Cardiovascular: Negative for chest pain, palpitations and leg swelling.    Gastrointestinal: Negative for abdominal pain, blood in stool, nausea and vomiting.       No changes in bowel habits.  Endocrine: Negative for cold intolerance, heat intolerance, polydipsia, polyphagia and polyuria.  Genitourinary: Negative for decreased urine volume, dysuria, hematuria, vaginal bleeding and vaginal discharge.  Musculoskeletal: Positive for arthralgias. Negative for gait problem, joint swelling, myalgias and neck pain.  Skin: Negative for color change and rash.  Allergic/Immunologic: Negative for environmental allergies.  Neurological: Negative for seizures, syncope, weakness, numbness and headaches.  Hematological: Negative for adenopathy. Does not bruise/bleed easily.  Psychiatric/Behavioral: Negative for confusion and sleep disturbance. The patient is not nervous/anxious.   All other systems reviewed and are negative.   Current Outpatient Prescriptions on File Prior to Visit  Medication Sig Dispense Refill  . buPROPion (WELLBUTRIN XL) 150 MG 24 hr tablet Take 150 mg by mouth daily.    Marland Kitchen escitalopram (LEXAPRO) 20 MG tablet Take 20 mg by mouth daily.      . haloperidol (HALDOL) 2 MG tablet Take 2 mg by mouth daily.    Marland Kitchen KLOR-CON M20 20 MEQ tablet TAKE 1 TABLET BY MOUTH ONCE A DAY 100 tablet 2  . levothyroxine (SYNTHROID, LEVOTHROID) 125 MCG tablet TAKE 1 TABLET (125 MCG TOTAL) BY MOUTH DAILY. 100 tablet 3  . omeprazole (PRILOSEC) 20 MG capsule TAKE 1 CAPSULE BY MOUTH DAILY 90 capsule 1  . OVER THE COUNTER MEDICATION      No current facility-administered medications on file prior to visit.      Past Medical History:  Diagnosis Date  . Anemia   . Depression   . DUB (dysfunctional uterine bleeding)   . GERD (gastroesophageal reflux disease)   . Hypothyroidism     Past Surgical History:  Procedure Laterality Date  . CHOLECYSTECTOMY N/A 07/16/2012   Procedure: LAPAROSCOPIC CHOLECYSTECTOMY WITH INTRAOPERATIVE CHOLANGIOGRAM;  Surgeon: Joyice Faster. Cornett, MD;   Location: [facility];  Service: General;  Laterality: N/A;  . FOOT SURGERY     Left    Allergies  Allergen Reactions  . Milk-Related Compounds     Intolerance to milk products-causes gas    Family History  Problem Relation Age of Onset  . Hypertension Mother   . Asthma Mother   . Cancer Father 35  . Colon cancer Neg Hx     Social History   Social History  . Marital status: Married    Spouse name: N/A  . Number of children: 0  . Years of education: N/A   Occupational History  .  [employer]   Social History Main Topics  . Smoking status: Former Smoker    Packs/day: 1.00    Years: 20.00    Types: Cigarettes    Start date: 03/13/1967    Quit date: 07/14/1987  . Smokeless tobacco: Never Used     Comment: quit 26 years ago  . Alcohol use No  . Drug use: No  . Sexual activity: Not Asked   Other Topics Concern  . None   Social History Narrative   Occupation: Freight forwarder   Regular exercise- yes   0 caffeine drinks      Vitals:   11/22/16 1402  BP: 126/78  Pulse: 72  Resp: 12  Temp: 98.6 F (37 C)  SpO2: 95%   Body mass index is 27.08 kg/m.   Wt Readings from Last 3 Encounters:  11/22/16 183 lb 6 oz (83.2 kg)  11/08/16 189 lb (85.7 kg)  07/18/16 180 lb (81.6 kg)    Physical Exam  Nursing note and vitals reviewed. Constitutional: She is oriented to person, place, and time. She appears well-developed. No distress.  HENT:  Head: Normocephalic and atraumatic.  Right Ear: Hearing, tympanic membrane, external ear and ear canal normal.  Left Ear: Hearing, tympanic membrane, external ear and ear canal normal.  Mouth/Throat: Uvula is midline, oropharynx is clear and moist and mucous membranes are normal.  Eyes: Pupils are equal, round, and reactive to light. Conjunctivae and EOM are normal.  Neck: No tracheal deviation present. No thyromegaly present.  Cardiovascular: Normal rate and regular rhythm.   No murmur heard. Pulses:      Dorsalis pedis pulses are 2+  on the right side, and 2+ on the left side.  Respiratory: Effort normal and breath sounds normal. No respiratory distress.  GI: Soft. She exhibits no mass. There is no hepatosplenomegaly. There is no tenderness.  Genitourinary: No breast swelling or tenderness.  Genitourinary Comments: Breast: No masses,skin changes,or nipple discharge bilateral.  Musculoskeletal: She exhibits no edema or tenderness.  No major deformity or signs of synovitis appreciated.  Lymphadenopathy:    She has no cervical adenopathy.    She has no axillary adenopathy.       Right: No supraclavicular adenopathy present.       Left: No supraclavicular adenopathy present.  Neurological: She is alert and oriented to person, place, and time. She has normal strength. No cranial nerve deficit. Coordination and gait normal.  Reflex Scores:      Bicep reflexes are 2+ on the right  side and 2+ on the left side.      Patellar reflexes are 2+ on the right side and 2+ on the left side. Skin: Skin is warm. No rash noted. No erythema.  Psychiatric: She has a normal mood and affect. Cognition and memory are normal.  Well groomed, good eye contact.     ASSESSMENT AND PLAN:   Ms. Maciel was seen today for annual exam.  Diagnoses and all orders for this visit:  Lab Results  Component Value Date   CHOL 192 11/22/2016   HDL 54.70 11/22/2016   LDLCALC 123 (H) 11/22/2016   LDLDIRECT 148.9 10/25/2010   TRIG 71.0 11/22/2016   CHOLHDL 4 11/22/2016    Routine general medical examination at a health care facility  We discussed the importance of regular physical activity and healthy diet for prevention of chronic illness and/or complications. Preventive guidelines reviewed. Vaccination updated. Pap smear due in 2020. Ca++ and vit D supplementation recommended. Next CPE in a year.  The 10-year ASCVD risk score Mikey Bussing DC Brooke Bonito., et al., 2013) is: 3.6%   Values used to calculate the score:     Age: 14 years     Sex: Female     Is  Non-Hispanic African American: Yes     Diabetic: No     Tobacco smoker: No     Systolic Blood Pressure: 812 mmHg     Is BP treated: No     HDL Cholesterol: 54.7 mg/dL     Total Cholesterol: 192 mg/dL  Encounter for HCV screening test for high risk patient -     Hepatitis C antibody screen  Screening for lipid disorders -     Lipid panel  Diabetes mellitus screening -     Hemoglobin A1c  Postablative hypothyroidism  No changes in current management, will follow labs done today and will give further recommendations accordingly. F/U in 6-12 months.  -     TSH  Need for influenza vaccination -     Flu Vaccine QUAD 36+ mos IM  Encounter for screening for HIV -     HIV antibody  Stage 3 chronic kidney disease ([place])  Educated about diagnosis and possible etiologies. He seems like he might be related to chronic NSAID use. Recommend low salt diet. Avoid NSAID's. She is not on ACEI or ARB for now, we will continue monitoring and recommend depending of labs. She is not interested in taking new meds. Prevention of hypertension and diabetes. Renal US ordered to also follow on left kidney cyst seen in 2014.  -     US Renal; Future -     Microalbumin / creatinine urine ratio -     VITAMIN D 25 Hydroxy (Vit-D Deficiency, Fractures) -     ANA  Need for 23-polyvalent pneumococcal polysaccharide vaccine -     Pneumococcal polysaccharide vaccine 23-valent greater than or equal to 2yo subcutaneous/IM    Return in 1 year (on 11/22/2017).    Malky Rudzinski G. [name], MD  [facility]. [place] office.

## 2016-11-22 ENCOUNTER — Encounter: Payer: BLUE CROSS/BLUE SHIELD | Admitting: Family Medicine

## 2016-11-22 ENCOUNTER — Encounter: Payer: Self-pay | Admitting: Family Medicine

## 2016-11-22 ENCOUNTER — Ambulatory Visit (INDEPENDENT_AMBULATORY_CARE_PROVIDER_SITE_OTHER): Payer: BLUE CROSS/BLUE SHIELD | Admitting: Family Medicine

## 2016-11-22 VITALS — BP 126/78 | HR 72 | Temp 98.6°F | Resp 12 | Ht 69.0 in | Wt 183.4 lb

## 2016-11-22 DIAGNOSIS — Z131 Encounter for screening for diabetes mellitus: Secondary | ICD-10-CM | POA: Diagnosis not present

## 2016-11-22 DIAGNOSIS — Z23 Encounter for immunization: Secondary | ICD-10-CM

## 2016-11-22 DIAGNOSIS — Z9189 Other specified personal risk factors, not elsewhere classified: Secondary | ICD-10-CM | POA: Diagnosis not present

## 2016-11-22 DIAGNOSIS — Z1159 Encounter for screening for other viral diseases: Secondary | ICD-10-CM

## 2016-11-22 DIAGNOSIS — Z0000 Encounter for general adult medical examination without abnormal findings: Secondary | ICD-10-CM

## 2016-11-22 DIAGNOSIS — N183 Chronic kidney disease, stage 3 unspecified: Secondary | ICD-10-CM

## 2016-11-22 DIAGNOSIS — Z13220 Encounter for screening for lipoid disorders: Secondary | ICD-10-CM | POA: Diagnosis not present

## 2016-11-22 DIAGNOSIS — Z114 Encounter for screening for human immunodeficiency virus [HIV]: Secondary | ICD-10-CM

## 2016-11-22 DIAGNOSIS — E890 Postprocedural hypothyroidism: Secondary | ICD-10-CM | POA: Diagnosis not present

## 2016-11-22 LAB — MICROALBUMIN / CREATININE URINE RATIO
CREATININE, U: 146.8 mg/dL
MICROALB/CREAT RATIO: 0.5 mg/g (ref 0.0–30.0)

## 2016-11-22 LAB — LIPID PANEL
CHOLESTEROL: 192 mg/dL (ref 0–200)
HDL: 54.7 mg/dL (ref >39.00)
LDL Cholesterol: 123 mg/dL — ABNORMAL HIGH (ref 0–99)
NonHDL: 136.9
TRIGLYCERIDES: 71 mg/dL (ref 0.0–149.0)
Total CHOL/HDL Ratio: 4
VLDL: 14.2 mg/dL (ref 0.0–40.0)

## 2016-11-22 LAB — HEMOGLOBIN A1C: Hgb A1c MFr Bld: 4.2 % — ABNORMAL LOW (ref 4.6–6.5)

## 2016-11-22 LAB — VITAMIN D 25 HYDROXY (VIT D DEFICIENCY, FRACTURES): VITD: 10.5 ng/mL — AB (ref 30.00–100.00)

## 2016-11-22 LAB — TSH: TSH: 0.57 u[IU]/mL (ref 0.35–4.50)

## 2016-11-22 NOTE — Patient Instructions (Signed)
A few things to remember from today's visit:   Encounter for HCV screening test for high risk patient - Plan: Hepatitis C antibody screen  Screening for lipid disorders - Plan: Lipid panel  Diabetes mellitus screening - Plan: Hemoglobin A1c  Postablative hypothyroidism - Plan: TSH  Need for influenza vaccination - Plan: Flu Vaccine QUAD 36+ mos IM  Encounter for screening for HIV - Plan: HIV antibody  Routine general medical examination at a health care facility  Stage 3 chronic kidney disease ([place]) - Plan: US Renal, Microalbumin / creatinine urine ratio, VITAMIN D 25 Hydroxy (Vit-D Deficiency, Fractures), ANA  Today you have you routine preventive visit.  At least 150 minutes of moderate exercise per week, daily brisk walking for 15-30 min is a good exercise option. Healthy diet low in saturated (animal) fats and sweets and consisting of fresh fruits and vegetables, lean meats such as fish and white chicken and whole grains.  These are some of recommendations for screening depending of age and risk factors:   - Vaccines:  Tdap vaccine every 10 years.  Shingles vaccine recommended at age 2, could be given after 57 years of age but not sure about insurance coverage.   Pneumonia vaccines:  Prevnar 13 at 65 and Pneumovax at 70. Sometimes Pneumovax is giving earlier if history of smoking, lung disease,diabetes,kidney disease among some.    Screening for diabetes at age 102 and every 3 years.  Cervical cancer prevention:  Pap smear starts at 57 years of age and continues periodically until 57 years old in low risk women. Pap smear every 3 years between 68 and 75 years old. Pap smear every 3-5 years between women 44 and older if pap smear negative and HPV screening negative.   -Breast cancer: Mammogram: There is disagreement between experts about when to start screening in low risk asymptomatic female but recent recommendations are to start screening at 71 and not later than 57  years old , every 1-2 years and after 57 yo q 2 years. Screening is recommended until 57 years old but some women can continue screening depending of healthy issues.   Colon cancer screening: starts at 57 years old until 57 years old.  Cholesterol disorder screening at age 26 and every 3 years.  Also recommended:  1. Dental visit- Brush and floss your teeth twice daily; visit your dentist twice a year. 2. Eye doctor- Get an eye exam at least every 2 years. 3. Helmet use- Always wear a helmet when riding a bicycle, motorcycle, rollerblading or skateboarding. 4. Safe sex- If you may be exposed to sexually transmitted infections, use a condom. 5. Seat belts- Seat belts can save your live; always wear one. 6. Smoke/Carbon Monoxide detectors- These detectors need to be installed on the appropriate level of your home. Replace batteries at least once a year. 7. Skin cancer- When out in the sun please cover up and use sunscreen 15 SPF or higher. 8. Violence- If anyone is threatening or hurting you, please tell your healthcare provider.  9. Drink alcohol in moderation- Limit alcohol intake to one drink or less per day. Never drink and drive.  Please be sure medication list is accurate. If a new problem present, please set up appointment sooner than planned today.

## 2016-11-25 LAB — ANA: Anti Nuclear Antibody(ANA): NEGATIVE

## 2016-11-25 LAB — HIV ANTIBODY (ROUTINE TESTING W REFLEX): HIV 1&2 Ab, 4th Generation: NONREACTIVE

## 2016-11-25 LAB — HEPATITIS C ANTIBODY
Hepatitis C Ab: NONREACTIVE
SIGNAL TO CUT-OFF: 0.12 (ref <1.00)

## 2016-11-26 ENCOUNTER — Ambulatory Visit (INDEPENDENT_AMBULATORY_CARE_PROVIDER_SITE_OTHER): Payer: BLUE CROSS/BLUE SHIELD | Admitting: Family Medicine

## 2016-11-26 ENCOUNTER — Ambulatory Visit: Payer: BLUE CROSS/BLUE SHIELD | Admitting: Family Medicine

## 2016-11-26 ENCOUNTER — Encounter: Payer: Self-pay | Admitting: Family Medicine

## 2016-11-26 VITALS — BP 132/74 | HR 71 | Temp 98.4°F | Resp 12 | Ht 69.0 in | Wt 183.4 lb

## 2016-11-26 DIAGNOSIS — R319 Hematuria, unspecified: Secondary | ICD-10-CM | POA: Diagnosis not present

## 2016-11-26 DIAGNOSIS — N390 Urinary tract infection, site not specified: Secondary | ICD-10-CM

## 2016-11-26 DIAGNOSIS — R399 Unspecified symptoms and signs involving the genitourinary system: Secondary | ICD-10-CM | POA: Diagnosis not present

## 2016-11-26 DIAGNOSIS — M545 Low back pain, unspecified: Secondary | ICD-10-CM

## 2016-11-26 DIAGNOSIS — R310 Gross hematuria: Secondary | ICD-10-CM

## 2016-11-26 LAB — POCT URINALYSIS DIPSTICK
Bilirubin, UA: NEGATIVE
Glucose, UA: NEGATIVE
Ketones, UA: NEGATIVE
NITRITE UA: NEGATIVE
PH UA: 6 (ref 5.0–8.0)
PROTEIN UA: POSITIVE
Spec Grav, UA: 1.015 (ref 1.010–1.025)
Urobilinogen, UA: 0.2 E.U./dL

## 2016-11-26 MED ORDER — NITROFURANTOIN MONOHYD MACRO 100 MG PO CAPS: 100.0000 mg | ORAL_CAPSULE | Freq: Two times a day (BID) | ORAL | 0 refills | Status: AC

## 2016-11-26 NOTE — Patient Instructions (Signed)
A few things to remember from today's visit:   UTI symptoms - Plan: POCT urinalysis dipstick  Gross hematuria - Plan: Ambulatory referral to Urology, DG Abd 1 View, Culture, Urine  Urinary tract infection with hematuria, site unspecified - Plan: nitrofurantoin, macrocrystal-monohydrate, (MACROBID) 100 MG capsule    Adequate fluid intake, avoid holding urine for long hours, and over the counter Vit C OR cranberry capsules might help.  Today we will treat empirically with antibiotic, which we might need to change when urine culture comes back depending of bacteria susceptibility.  Seek immediate medical attention if severe abdominal pain, vomiting, fever/chills, or worsening symptoms.   Hematuria, Adult Hematuria is blood in your urine. It can be caused by a bladder infection, kidney infection, prostate infection, kidney stone, or cancer of your urinary tract. Infections can usually be treated with medicine, and a kidney stone usually will pass through your urine. If neither of these is the cause of your hematuria, further workup to find out the reason may be needed. It is very important that you tell your health care provider about any blood you see in your urine, even if the blood stops without treatment or happens without causing pain. Blood in your urine that happens and then stops and then happens again can be a symptom of a very serious condition. Also, pain is not a symptom in the initial stages of many urinary cancers. Follow these instructions at home:  Drink lots of fluid, 3-4 quarts a day. If you have been diagnosed with an infection, cranberry juice is especially recommended, in addition to large amounts of water.  Avoid caffeine, tea, and carbonated beverages because they tend to irritate the bladder.  Avoid alcohol because it may irritate the prostate.  Take all medicines as directed by your health care provider.  If you were prescribed an antibiotic medicine, finish it all  even if you start to feel better.  If you have been diagnosed with a kidney stone, follow your health care provider's instructions regarding straining your urine to catch the stone.  Empty your bladder often. Avoid holding urine for long periods of time.  After a bowel movement, women should cleanse front to back. Use each tissue only once.  Empty your bladder before and after sexual intercourse if you are a female. Contact a health care provider if:  You develop back pain.  You have a fever.  You have a feeling of sickness in your stomach (nausea) or vomiting.  Your symptoms are not better in 3 days. Return sooner if you are getting worse. Get help right away if:  You develop severe vomiting and are unable to keep the medicine down.  You develop severe back or abdominal pain despite taking your medicines.  You begin passing a large amount of blood or clots in your urine.  You feel extremely weak or faint, or you pass out. This information is not intended to replace advice given to you by your health care provider. Make sure you discuss any questions you have with your health care provider. Document Released: 01/21/2005 Document Revised: 06/29/2015 Document Reviewed: 09/21/2012 Elsevier Interactive Patient Education  2017 [place].   Please be sure medication list is accurate. If a new problem present, please set up appointment sooner than planned today.

## 2016-11-26 NOTE — Progress Notes (Addendum)
HPI:   [name] is a 56 y.o. female, who is here today complaining of 8-12 hours of urinary symptoms: Gross hematuria and urinary frequency. Gradual onset, started with urge to urinate and urine frequency. Today symptoms have resolved. + Odorous urine.  Dysuria: Denies Urinary urgency: Last night. Incontinence: Denies  Abdominal pain: LLQ dull pain, mild, no radiated. It was constant, not identified exacerbating or alleviating factors. She is not having pain today. She had defecation urge last night and had a normal consistency stool but bigger than usual.    Nausea or vomiting: Denies  Abnormal vaginal bleeding or discharge: Denies  KGU:RKYH menopausal. Sexual activity: Denies Hx of UTI: Denies. She was treated for UTI I 2014. No prior Hx of gross hematuria.  Hx of CKD III and iron deficiency anemia.  OTC medications for this problem: Motrin 400 mg last night. She increased water intake. Usually she drinks sweet tea and 2 sodas daily, little or no water.  Denies FHx of renal or bladder malignancy.  Left low back pain has also resolved. No Hx of back pain. No Hx of trauma.  Former smoker. No Hx of nephrolithiasis.  Lab Results  Component Value Date   CREATININE 1.4 (H) 11/01/2016   BUN 8 11/01/2016   NA 141 11/01/2016   K 3.4 11/01/2016   CL 109 (H) 11/01/2016   CO2 29 11/01/2016   Lab Results  Component Value Date   WBC 3.6 (L) 11/01/2016   HGB 9.7 (L) 11/01/2016   HCT 30.3 (L) 11/01/2016   MCV 96 11/01/2016   PLT 233 11/01/2016     Review of Systems  Constitutional: Positive for fatigue. Negative for activity change, appetite change, chills and fever.  HENT: Negative for mouth sores, nosebleeds and sore throat.   Respiratory: Negative for cough, shortness of breath and wheezing.   Cardiovascular: Negative for leg swelling.  Gastrointestinal: Positive for abdominal pain. Negative for blood in stool, nausea and vomiting.    Genitourinary: Positive for frequency, hematuria and urgency. Negative for decreased urine volume, dysuria, vaginal bleeding and vaginal discharge.  Musculoskeletal: Positive for back pain. Negative for gait problem and myalgias.  Skin: Negative for pallor and rash.  Neurological: Negative for syncope, weakness and numbness.  Hematological: Negative for adenopathy. Does not bruise/bleed easily.      Current Outpatient Prescriptions on File Prior to Visit  Medication Sig Dispense Refill  . buPROPion (WELLBUTRIN XL) 150 MG 24 hr tablet Take 150 mg by mouth daily.    Marland Kitchen escitalopram (LEXAPRO) 20 MG tablet Take 20 mg by mouth daily.      . haloperidol (HALDOL) 2 MG tablet Take 2 mg by mouth daily.    Marland Kitchen KLOR-CON M20 20 MEQ tablet TAKE 1 TABLET BY MOUTH ONCE A DAY 100 tablet 2  . levothyroxine (SYNTHROID, LEVOTHROID) 125 MCG tablet TAKE 1 TABLET (125 MCG TOTAL) BY MOUTH DAILY. 100 tablet 3  . omeprazole (PRILOSEC) 20 MG capsule TAKE 1 CAPSULE BY MOUTH DAILY 90 capsule 1  . OVER THE COUNTER MEDICATION      No current facility-administered medications on file prior to visit.      Past Medical History:  Diagnosis Date  . Anemia   . Depression   . DUB (dysfunctional uterine bleeding)   . GERD (gastroesophageal reflux disease)   . Hypothyroidism    Allergies  Allergen Reactions  . Milk-Related Compounds     Intolerance to milk products-causes gas    Social  History   Social History  . Marital status: Married    Spouse name: N/A  . Number of children: 0  . Years of education: N/A   Occupational History  .  [employer]   Social History Main Topics  . Smoking status: Former Smoker    Packs/day: 1.00    Years: 20.00    Types: Cigarettes    Start date: 03/13/1967    Quit date: 07/14/1987  . Smokeless tobacco: Never Used     Comment: quit 26 years ago  . Alcohol use No  . Drug use: No  . Sexual activity: Not Asked   Other Topics Concern  . None   Social History Narrative    Occupation: Freight forwarder   Regular exercise- yes   0 caffeine drinks     Vitals:   11/26/16 0736  BP: 132/74  Pulse: 71  Resp: 12  Temp: 98.4 F (36.9 C)  SpO2: 98%   Body mass index is 27.08 kg/m.   Physical Exam  Nursing note and vitals reviewed. Constitutional: She is oriented to person, place, and time. She appears well-developed. No distress.  HENT:  Head: Normocephalic and atraumatic.  Mouth/Throat: Oropharynx is clear and moist and mucous membranes are normal.  Eyes: Conjunctivae are normal. No scleral icterus.  Cardiovascular: Normal rate and regular rhythm.   Respiratory: Effort normal and breath sounds normal. No respiratory distress.  GI: Soft. She exhibits no mass. There is no hepatosplenomegaly. There is no tenderness. There is CVA tenderness (Right).  Musculoskeletal: She exhibits no edema.       Lumbar back: She exhibits no bony tenderness.  Mild pain upon palpation of lower thoracic and lumbar paraspinal muscles.  Lymphadenopathy:    She has no cervical adenopathy.  Neurological: She is alert and oriented to person, place, and time. She has normal strength. Gait normal.  Skin: Skin is warm. No rash noted. No erythema.  Psychiatric: Her mood appears anxious.  Well groomed, good eye contact.     ASSESSMENT AND PLAN:   Ms. Karen [name] was seen today for possible uti.  Diagnoses and all orders for this visit:  UTI symptoms  Urine dipstick abnormal: 3+ blood,LEUK,and protein. Possible causes discussed. Increase water intake recommended. Decreased amount of caffeinated drinks as well as other foods/drinks that can cause bladder irritation.  -     POCT urinalysis dipstick  Gross hematuria  We discussed possible etiologies, including infectious, nephrolithiasis, IC,and malignancy. Because symptoms resolved I do not think CBC is needed today. Instructed about warning signs.  -     Ambulatory referral to Urology -     DG Abd 1 View; Future -      Culture, Urine  Urinary tract infection with hematuria, site unspecified  Treatment options discussed, because hx of anemia I recommend Macrobid instead bactrim.  Ucx ordered.   Will tailor treatment according to Ucx results and susceptibility report.  LLQ abdominal pain resolved, could be related to UTI. Colonoscopy in 11/2009 no diverticulosis,normal.  Clearly instructed about warning signs. F/U if symptoms persist.  -     nitrofurantoin, macrocrystal-monohydrate, (MACROBID) 100 MG capsule; Take 1 capsule (100 mg total) by mouth 2 (two) times daily.  Acute low back pain without sciatica, unspecified back pain laterality  Left low back pain resolved. Today right back pain with palpation and with percussion of CVA. I do not think it is related to urinary symptoms. Instructed to monitor for fever,chlls, worsening pain,and reoccurrence of urinary symptoms. F/U as needed.    -  [name] was advised to return or notify a doctor immediately if symptoms worsen or persist or new concerns arise.       Ardelle Haliburton G. [name], MD  [facility]. [place] office.

## 2016-11-27 ENCOUNTER — Other Ambulatory Visit: Payer: Self-pay

## 2016-11-27 MED ORDER — VITAMIN D (ERGOCALCIFEROL) 1.25 MG (50000 UNIT) PO CAPS: 50000.0000 [IU] | ORAL_CAPSULE | ORAL | 1 refills | Status: DC

## 2016-12-09 ENCOUNTER — Telehealth: Payer: Self-pay | Admitting: Family Medicine

## 2016-12-09 NOTE — Telephone Encounter (Signed)
° ° ° °  Pt said she is still having UTI sympton said her urine is smelling . Would like a call back

## 2016-12-09 NOTE — Telephone Encounter (Signed)
Spoke with patient, states that she is having a bad smell that started on 12/08/16, finished antibiotic on the previous Sunday.

## 2016-12-09 NOTE — Telephone Encounter (Signed)
It seems like Ucx was not sent. Smelling urine alone is not a sign of UTI, can be related to med,hydration state and even foods. As fas as she is not having pain, I do not think we need to re-evaluate. If she is still concerned she can come to lab to collect urine for Ucx.  Thanks, BJ

## 2016-12-10 NOTE — Telephone Encounter (Signed)
Left message for patient to give clinic a call back. 

## 2016-12-11 NOTE — Telephone Encounter (Signed)
Left message to give clinic a call back. 

## 2016-12-13 NOTE — Telephone Encounter (Signed)
Left message for patient to give clinic a call back. 

## 2016-12-17 NOTE — Telephone Encounter (Signed)
Left voicemail letting patient know that if she has anymore issues she can call the office to make an appointment.

## 2016-12-19 ENCOUNTER — Telehealth: Payer: Self-pay | Admitting: Hematology & Oncology

## 2016-12-19 NOTE — Telephone Encounter (Signed)
Faxed medical records to: [facility] 02/18/1960 [phone]     COPY SCANNED

## 2017-01-01 ENCOUNTER — Ambulatory Visit
Admission: RE | Admit: 2017-01-01 | Discharge: 2017-01-01 | Disposition: A | Discharge status: Home / Self Care | Payer: BLUE CROSS/BLUE SHIELD | Source: Ambulatory Visit | Attending: Family Medicine | Admitting: Family Medicine

## 2017-01-01 DIAGNOSIS — N183 Chronic kidney disease, stage 3 unspecified: Secondary | ICD-10-CM

## 2017-01-16 ENCOUNTER — Other Ambulatory Visit: Payer: Self-pay | Admitting: Family Medicine

## 2017-01-16 DIAGNOSIS — Z139 Encounter for screening, unspecified: Secondary | ICD-10-CM

## 2017-01-29 ENCOUNTER — Encounter: Payer: Self-pay | Admitting: Gastroenterology

## 2017-02-04 HISTORY — PX: UPPER GASTROINTESTINAL ENDOSCOPY: SHX188

## 2017-02-06 ENCOUNTER — Encounter: Payer: Self-pay | Admitting: Gastroenterology

## 2017-02-28 ENCOUNTER — Ambulatory Visit: Payer: BLUE CROSS/BLUE SHIELD

## 2017-03-07 ENCOUNTER — Encounter: Payer: Self-pay | Admitting: Family

## 2017-03-07 ENCOUNTER — Inpatient Hospital Stay: Payer: Managed Care, Other (non HMO)

## 2017-03-07 ENCOUNTER — Inpatient Hospital Stay: Payer: Managed Care, Other (non HMO) | Attending: Hematology & Oncology

## 2017-03-07 ENCOUNTER — Inpatient Hospital Stay (HOSPITAL_BASED_OUTPATIENT_CLINIC_OR_DEPARTMENT_OTHER): Payer: Managed Care, Other (non HMO) | Admitting: Family

## 2017-03-07 ENCOUNTER — Other Ambulatory Visit: Payer: Self-pay

## 2017-03-07 VITALS — BP 109/52 | HR 65 | Temp 98.3°F | Wt 190.0 lb

## 2017-03-07 DIAGNOSIS — D631 Anemia in chronic kidney disease: Secondary | ICD-10-CM | POA: Diagnosis not present

## 2017-03-07 DIAGNOSIS — N189 Chronic kidney disease, unspecified: Secondary | ICD-10-CM | POA: Diagnosis not present

## 2017-03-07 DIAGNOSIS — D500 Iron deficiency anemia secondary to blood loss (chronic): Secondary | ICD-10-CM

## 2017-03-07 DIAGNOSIS — D509 Iron deficiency anemia, unspecified: Secondary | ICD-10-CM | POA: Diagnosis not present

## 2017-03-07 LAB — CBC WITH DIFFERENTIAL (CANCER CENTER ONLY)
Basophils Absolute: 0 10*3/uL (ref 0.0–0.1)
Basophils Relative: 0 %
EOS ABS: 0 10*3/uL (ref 0.0–0.5)
EOS PCT: 1 %
HCT: 29.8 % — ABNORMAL LOW (ref 34.8–46.6)
Hemoglobin: 9.4 g/dL — ABNORMAL LOW (ref 11.6–15.9)
LYMPHS ABS: 1.4 10*3/uL (ref 0.9–3.3)
Lymphocytes Relative: 42 %
MCH: 29.4 pg (ref 26.0–34.0)
MCHC: 31.5 g/dL — ABNORMAL LOW (ref 32.0–36.0)
MCV: 93.1 fL (ref 81.0–101.0)
Monocytes Absolute: 0.3 10*3/uL (ref 0.1–0.9)
Monocytes Relative: 8 %
Neutro Abs: 1.6 10*3/uL (ref 1.5–6.5)
Neutrophils Relative %: 49 %
PLATELETS: 202 10*3/uL (ref 145–400)
RBC: 3.2 MIL/uL — ABNORMAL LOW (ref 3.70–5.32)
RDW: 12.8 % (ref 11.1–15.7)
WBC: 3.2 10*3/uL — AB (ref 3.9–10.0)

## 2017-03-07 LAB — COMPREHENSIVE METABOLIC PANEL
ALT: 14 U/L (ref 10–47)
AST: 21 U/L (ref 11–38)
Albumin: 3.9 g/dL (ref 3.5–5.0)
Alkaline Phosphatase: 65 U/L (ref 26–84)
Anion gap: 7 (ref 5–15)
BUN: 14 mg/dL (ref 7–22)
CHLORIDE: 110 mmol/L — AB (ref 98–108)
CO2: 29 mmol/L (ref 18–33)
Calcium: 9.8 mg/dL (ref 8.0–10.3)
Creatinine, Ser: 1.1 mg/dL (ref 0.60–1.20)
GLUCOSE: 125 mg/dL — AB (ref 73–118)
POTASSIUM: 4.7 mmol/L (ref 3.3–4.7)
SODIUM: 146 mmol/L — AB (ref 128–145)
TOTAL PROTEIN: 7.8 g/dL (ref 6.4–8.1)
Total Bilirubin: 0.9 mg/dL (ref 0.2–1.6)

## 2017-03-07 LAB — RETICULOCYTES
RBC.: 3.11 MIL/uL — ABNORMAL LOW (ref 3.87–5.11)
Retic Count, Absolute: 28 10*3/uL (ref 19.0–186.0)
Retic Ct Pct: 0.9 % (ref 0.4–3.1)

## 2017-03-07 MED ORDER — DARBEPOETIN ALFA 300 MCG/0.6ML IJ SOSY
300.0000 ug | PREFILLED_SYRINGE | Freq: Once | INTRAMUSCULAR | Status: AC
  Administered 2017-03-07: 300 ug via SUBCUTANEOUS

## 2017-03-07 MED ORDER — DARBEPOETIN ALFA 300 MCG/0.6ML IJ SOSY
PREFILLED_SYRINGE | INTRAMUSCULAR | Status: AC
  Filled 2017-03-07: qty 0.6

## 2017-03-07 NOTE — Progress Notes (Signed)
Hematology and Oncology Follow Up Visit  Karen [name] [id number] 07-20-1960 57 y.o. 03/07/2017   Principle Diagnosis:  Anemia of renal insufficiency Intermittent iron deficiency anemia   Current Therapy:   Aranesp 300 mcg subcutaneous for hemoglobin less than 10  IV iron as indicated   Interim History:  Karen [name] is her today for follow-up. She is doing well and has no complaints at this time.  No fever, chills, n/v, cough, rash, dizziness, SOB, chest pain, palpitations, abdominal pain or changes in bowel or bladder habits.  Hgb is 9.4. No episodes of bleeding, bruising or petechiae.  No swelling, tenderness, numbness or tingling in her extremities. No c/o pain.  No lymphadenopathy found on exam.  She has maintained a good appetite and is staying well hydrated. Her weight is stable.  She is staying busy working 2 jobs.   ECOG Performance Status: 1 - Symptomatic but completely ambulatory  Medications:  Allergies as of 03/07/2017      Reactions   Milk-related Compounds    Intolerance to milk products-causes gas      Medication List        Accurate as of 03/07/17  3:58 PM. Always use your most recent med list.          buPROPion 150 MG 24 hr tablet Commonly known as:  WELLBUTRIN XL Take 150 mg by mouth daily.   escitalopram 20 MG tablet Commonly known as:  LEXAPRO Take 20 mg by mouth daily.   haloperidol 2 MG tablet Commonly known as:  HALDOL Take 2 mg by mouth daily.   KLOR-CON M20 20 MEQ tablet Generic drug:  potassium chloride SA TAKE 1 TABLET BY MOUTH ONCE A DAY   levothyroxine 125 MCG tablet Commonly known as:  SYNTHROID, LEVOTHROID TAKE 1 TABLET (125 MCG TOTAL) BY MOUTH DAILY.   omeprazole 20 MG capsule Commonly known as:  PRILOSEC TAKE 1 CAPSULE BY MOUTH DAILY   OVER THE COUNTER MEDICATION   Vitamin D (Ergocalciferol) 50000 units Caps capsule Commonly known as:  DRISDOL Take 1 capsule (50,000 Units total) by mouth every 7 (seven) days. For 8  weeks. Then every 2 weeks.       Allergies:  Allergies  Allergen Reactions  . Milk-Related Compounds     Intolerance to milk products-causes gas    Past Medical History, Surgical history, Social history, and Family History were reviewed and updated.  Review of Systems: All other 10 point review of systems is negative.   Physical Exam:  weight is 190 lb (86.2 kg). Her oral temperature is 98.3 F (36.8 C). Her blood pressure is 109/52 (abnormal) and her pulse is 65. Her oxygen saturation is 100%.   Wt Readings from Last 3 Encounters:  03/07/17 190 lb (86.2 kg)  11/26/16 183 lb 6 oz (83.2 kg)  11/22/16 183 lb 6 oz (83.2 kg)    Ocular: Sclerae unicteric, pupils equal, round and reactive to light Ear-nose-throat: Oropharynx clear, dentition fair Lymphatic: No cervical, supraclavicular or axillary adenopathy Lungs no rales or rhonchi, good excursion bilaterally Heart regular rate and rhythm, no murmur appreciated Abd soft, nontender, positive bowel sounds, no liver or spleen tip palpated on exam, no fluid wave  MSK no focal spinal tenderness, no joint edema Neuro: non-focal, well-oriented, appropriate affect Breasts: Deferred   Lab Results  Component Value Date   WBC 3.2 (L) 03/07/2017   HGB 9.7 (L) 11/01/2016   HCT 29.8 (L) 03/07/2017   MCV 93.1 03/07/2017   PLT 202 03/07/2017  Lab Results  Component Value Date   FERRITIN 389 (H) 11/01/2016   IRON 62 11/01/2016   TIBC 236 11/01/2016   UIBC 175 11/01/2016   IRONPCTSAT 26 11/01/2016   Lab Results  Component Value Date   RETICCTPCT 0.8 11/18/2014   RBC 3.20 (L) 03/07/2017   RETICCTABS 26.4 11/18/2014   No results found for: KPAFRELGTCHN, LAMBDASER, KAPLAMBRATIO No results found for: IGGSERUM, IGA, IGMSERUM No results found for: Kathrynn Ducking, MSPIKE, SPEI   Chemistry      Component Value Date/Time   NA 146 (H) 03/07/2017 1505   NA 141 11/01/2016 1504   K 4.7  03/07/2017 1505   K 3.4 11/01/2016 1504   CL 110 (H) 03/07/2017 1505   CL 109 (H) 11/01/2016 1504   CO2 29 03/07/2017 1505   CO2 29 11/01/2016 1504   BUN 14 03/07/2017 1505   BUN 8 11/01/2016 1504   CREATININE 1.10 03/07/2017 1505   CREATININE 1.4 (H) 11/01/2016 1504      Component Value Date/Time   CALCIUM 9.8 03/07/2017 1505   CALCIUM 8.8 11/01/2016 1504   ALKPHOS 65 03/07/2017 1505   ALKPHOS 89 (H) 11/01/2016 1504   AST 21 03/07/2017 1505   AST 20 11/01/2016 1504   ALT 14 03/07/2017 1505   ALT 16 11/01/2016 1504   BILITOT 0.9 03/07/2017 1505   BILITOT 0.80 11/01/2016 1504      Impression and Plan: Karen [name] is a very pleasant 57 yo [name] with both iron deficiency anemia and anemia of chronic renal insufficiency.  Hgb is 9.4 so we will give her Aranesp today as planned.  We will see what her iron studies show and bring her back in next week for infusion if needed.  We will plan to see her back in another 3 months for follow-up.  She will contact our office with any questions or concerns. We can certainly see her sooner if need be.   Karen Peace, NP 2/1/20193:58 PM

## 2017-03-10 LAB — IRON AND TIBC
Iron: 98 ug/dL (ref 41–142)
SATURATION RATIOS: 42 % (ref 21–57)
TIBC: 231 ug/dL — ABNORMAL LOW (ref 236–444)
UIBC: 133 ug/dL

## 2017-03-10 LAB — FERRITIN: Ferritin: 474 ng/mL — ABNORMAL HIGH (ref 9–269)

## 2017-03-11 ENCOUNTER — Ambulatory Visit: Payer: BLUE CROSS/BLUE SHIELD

## 2017-03-11 ENCOUNTER — Other Ambulatory Visit: Payer: BLUE CROSS/BLUE SHIELD

## 2017-03-11 ENCOUNTER — Ambulatory Visit: Payer: BLUE CROSS/BLUE SHIELD | Admitting: Family

## 2017-04-17 ENCOUNTER — Ambulatory Visit
Admission: RE | Admit: 2017-04-17 | Discharge: 2017-04-17 | Disposition: A | Discharge status: Home / Self Care | Payer: Managed Care, Other (non HMO) | Source: Ambulatory Visit | Attending: Family Medicine | Admitting: Family Medicine

## 2017-04-17 DIAGNOSIS — Z139 Encounter for screening, unspecified: Secondary | ICD-10-CM

## 2017-04-18 ENCOUNTER — Other Ambulatory Visit: Payer: Self-pay

## 2017-04-18 ENCOUNTER — Ambulatory Visit (AMBULATORY_SURGERY_CENTER): Payer: Self-pay | Admitting: *Deleted

## 2017-04-18 VITALS — Ht 70.0 in | Wt 194.0 lb

## 2017-04-18 DIAGNOSIS — K2270 Barrett's esophagus without dysplasia: Secondary | ICD-10-CM

## 2017-04-18 NOTE — Progress Notes (Signed)
No egg or soy allergy known to patient  No issues with past sedation with any surgeries  or procedures, no intubation problems  No diet pills per patient No home 02 use per patient  No blood thinners per patient  Pt denies issues with constipation  No A fib or A flutter  EMMI video sent to pt's e mail pt declined   

## 2017-04-21 ENCOUNTER — Encounter: Payer: Self-pay | Admitting: Gastroenterology

## 2017-04-23 ENCOUNTER — Telehealth: Payer: Self-pay | Admitting: Family Medicine

## 2017-04-23 NOTE — Telephone Encounter (Signed)
Copied from [place] [phone]. Topic: Quick Communication - Rx Refill/Question >> Apr 23, 2017  2:42 PM Oliver Pila B wrote: Medication:  levothyroxine Wilmer Floor, LEVOTHROID) 125 MCG tablet [[id number]]    Has the patient contacted their pharmacy? Yes.     (Agent: If no, request that the patient contact the pharmacy for the refill.)   Preferred Pharmacy (with phone number or street name): [facility]   Agent: Please be advised that RX refills may take up to 3 business days. We ask that you follow-up with your pharmacy.

## 2017-04-24 ENCOUNTER — Other Ambulatory Visit: Payer: Self-pay

## 2017-04-24 MED ORDER — LEVOTHYROXINE SODIUM 125 MCG PO TABS: ORAL_TABLET | ORAL | 3 refills | Status: DC

## 2017-05-01 ENCOUNTER — Other Ambulatory Visit: Payer: Self-pay | Admitting: Family Medicine

## 2017-05-02 ENCOUNTER — Other Ambulatory Visit: Payer: Self-pay

## 2017-05-02 ENCOUNTER — Ambulatory Visit (AMBULATORY_SURGERY_CENTER): Payer: Managed Care, Other (non HMO) | Admitting: Gastroenterology

## 2017-05-02 ENCOUNTER — Encounter: Payer: Self-pay | Admitting: Gastroenterology

## 2017-05-02 VITALS — BP 159/86 | HR 74 | Temp 97.7°F | Resp 15 | Ht 70.0 in | Wt 194.0 lb

## 2017-05-02 DIAGNOSIS — K2270 Barrett's esophagus without dysplasia: Secondary | ICD-10-CM | POA: Diagnosis not present

## 2017-05-02 DIAGNOSIS — K208 Other esophagitis: Secondary | ICD-10-CM | POA: Diagnosis not present

## 2017-05-02 DIAGNOSIS — K317 Polyp of stomach and duodenum: Secondary | ICD-10-CM

## 2017-05-02 MED ORDER — SODIUM CHLORIDE 0.9 % IV SOLN: 500.0000 mL | Freq: Once | INTRAVENOUS | Status: DC

## 2017-05-02 NOTE — Op Note (Signed)
[place] Patient Name: Karen [name] Procedure Date: 05/02/2017 1:32 PM MRN: [id number] Endoscopist: Ladene Artist , MD Age: 57 Referring MD:  Date of Birth: [birth year] Gender: Female Account #: [id number] Procedure:                Upper GI endoscopy Indications:              Surveillance for malignancy due to personal history                            of Barrett's esophagus Medicines:                Monitored Anesthesia Care Procedure:                Pre-Anesthesia Assessment:                           - Prior to the procedure, a History and Physical                            was performed, and patient medications and                            allergies were reviewed. The patient's tolerance of                            previous anesthesia was also reviewed. The risks                            and benefits of the procedure and the sedation                            options and risks were discussed with the patient.                            All questions were answered, and informed consent                            was obtained. Prior Anticoagulants: The patient has                            taken no previous anticoagulant or antiplatelet                            agents. ASA Grade Assessment: II - A patient with                            mild systemic disease. After reviewing the risks                            and benefits, the patient was deemed in                            satisfactory condition to undergo the procedure.  After obtaining informed consent, the endoscope was                            passed under direct vision. Throughout the                            procedure, the patient's blood pressure, pulse, and                            oxygen saturations were monitored continuously. The                            Endoscope was introduced through the mouth, and                            advanced to the second part of  duodenum. The upper                            GI endoscopy was accomplished without difficulty.                            The patient tolerated the procedure well. Scope In: Scope Out: Findings:                 There were esophageal mucosal changes secondary to                            established short-segment Barrett's disease present                            in the distal esophagus. The maximum longitudinal                            extent of these mucosal changes was 1 cm in length.                            Mucosa was biopsied with a cold forceps for                            histology randomly at intervals of 1 cm in the                            lower third of the esophagus. One specimen bottle                            was sent to pathology.                           The exam of the esophagus was otherwise normal.                           A single 4 mm sessile polyp with no stigmata of  recent bleeding was found in the gastric fundus.                            Biopsies were taken with a cold forceps for                            histology.                           A small hiatal hernia was present.                           The exam of the stomach was otherwise normal.                           The duodenal bulb and second portion of the                            duodenum were normal. Complications:            No immediate complications. Estimated Blood Loss:     Estimated blood loss was minimal. Impression:               - Esophageal mucosal changes secondary to                            established short-segment Barrett's disease.                            Biopsied.                           - A single gastric polyp. Biopsied.                           - Small hiatal hernia.                           - Normal duodenal bulb and second portion of the                            duodenum. Recommendation:           - Patient has a  contact number available for                            emergencies. The signs and symptoms of potential                            delayed complications were discussed with the                            patient. Return to normal activities tomorrow.                            Written discharge instructions were provided to the  patient.                           - Resume previous diet.                           - Continue present medications.                           - Await pathology results.                           - Repeat upper endoscopy in 3 years for                            surveillance if no dysplasia. Ladene Artist, MD 05/02/2017 1:48:49 PM This report has been signed electronically.

## 2017-05-02 NOTE — Patient Instructions (Signed)
YOU HAD AN ENDOSCOPIC PROCEDURE TODAY AT [facility]:   Refer to the procedure report that was given to you for any specific questions about what was found during the examination.  If the procedure report does not answer your questions, please call your gastroenterologist to clarify.  If you requested that your care partner not be given the details of your procedure findings, then the procedure report has been included in a sealed envelope for you to review at your convenience later.  YOU SHOULD EXPECT: Some feelings of bloating in the abdomen. Passage of more gas than usual.  Walking can help get rid of the air that was put into your GI tract during the procedure and reduce the bloating. If you had a lower endoscopy (such as a colonoscopy or flexible sigmoidoscopy) you may notice spotting of blood in your stool or on the toilet paper. If you underwent a bowel prep for your procedure, you may not have a normal bowel movement for a few days.  Please Note:  You might notice some irritation and congestion in your nose or some drainage.  This is from the oxygen used during your procedure.  There is no need for concern and it should clear up in a day or so.  SYMPTOMS TO REPORT IMMEDIATELY:   Following upper endoscopy (EGD)  Vomiting of blood or coffee ground material  New chest pain or pain under the shoulder blades  Painful or persistently difficult swallowing  New shortness of breath  Fever of 100F or higher  Black, tarry-looking stools  Please see handout on Barrett's esophagus.  For urgent or emergent issues, a gastroenterologist can be reached at any hour by calling [phone].   DIET:  We do recommend a small meal at first, but then you may proceed to your regular diet.  Drink plenty of fluids but you should avoid alcoholic beverages for 24 hours.  ACTIVITY:  You should plan to take it easy for the rest of today and you should NOT DRIVE or use heavy machinery until  tomorrow (because of the sedation medicines used during the test).    FOLLOW UP: Our staff will call the number listed on your records the next business day following your procedure to check on you and address any questions or concerns that you may have regarding the information given to you following your procedure. If we do not reach you, we will leave a message.  However, if you are feeling well and you are not experiencing any problems, there is no need to return our call.  We will assume that you have returned to your regular daily activities without incident.  If any biopsies were taken you will be contacted by phone or by letter within the next 1-3 weeks.  Please call us at [phone] if you have not heard about the biopsies in 3 weeks.    SIGNATURES/CONFIDENTIALITY: You and/or your care partner have signed paperwork which will be entered into your electronic medical record.  These signatures attest to the fact that that the information above on your After Visit Summary has been reviewed and is understood.  Full responsibility of the confidentiality of this discharge information lies with you and/or your care-partner.   Thank you for allowing [place] to provide your healthcare today.

## 2017-05-02 NOTE — Progress Notes (Signed)
Called to room to assist during endoscopic procedure.  Patient ID and intended procedure confirmed with present staff. Received instructions for my participation in the procedure from the performing physician.  

## 2017-05-02 NOTE — Progress Notes (Signed)
Pt's states no medical or surgical changes since previsit or office visit. 

## 2017-05-02 NOTE — Progress Notes (Signed)
Report to PACU, RN, vss, BBS= Clear.  

## 2017-05-05 ENCOUNTER — Telehealth: Payer: Self-pay | Admitting: *Deleted

## 2017-05-05 NOTE — Telephone Encounter (Signed)
  Follow up Call-  Call back number 05/02/2017  Post procedure Call Back phone  # [phone] (Husband will answer for pt)  Permission to leave phone message Yes  Some recent data might be hidden     Patient questions:  Do you have a fever, pain , or abdominal swelling? No. Pain Score  0 *  Have you tolerated food without any problems? Yes.    Have you been able to return to your normal activities? Yes.    Do you have any questions about your discharge instructions: Diet   No. Medications  No. Follow up visit  No.  Do you have questions or concerns about your Care? No.  Actions: * If pain score is 4 or above: No action needed, pain <4.

## 2017-05-19 ENCOUNTER — Other Ambulatory Visit: Payer: Self-pay

## 2017-05-19 ENCOUNTER — Inpatient Hospital Stay: Payer: Managed Care, Other (non HMO)

## 2017-05-19 ENCOUNTER — Inpatient Hospital Stay (HOSPITAL_BASED_OUTPATIENT_CLINIC_OR_DEPARTMENT_OTHER): Payer: Managed Care, Other (non HMO) | Admitting: Family

## 2017-05-19 ENCOUNTER — Inpatient Hospital Stay: Payer: Managed Care, Other (non HMO) | Attending: Hematology & Oncology

## 2017-05-19 ENCOUNTER — Encounter: Payer: Self-pay | Admitting: Family

## 2017-05-19 VITALS — BP 141/85 | HR 75 | Temp 98.3°F | Resp 16 | Wt 195.0 lb

## 2017-05-19 DIAGNOSIS — N189 Chronic kidney disease, unspecified: Secondary | ICD-10-CM | POA: Diagnosis not present

## 2017-05-19 DIAGNOSIS — D631 Anemia in chronic kidney disease: Secondary | ICD-10-CM

## 2017-05-19 DIAGNOSIS — D500 Iron deficiency anemia secondary to blood loss (chronic): Secondary | ICD-10-CM

## 2017-05-19 DIAGNOSIS — D509 Iron deficiency anemia, unspecified: Secondary | ICD-10-CM

## 2017-05-19 DIAGNOSIS — R51 Headache: Secondary | ICD-10-CM

## 2017-05-19 LAB — CBC WITH DIFFERENTIAL (CANCER CENTER ONLY)
BASOS ABS: 0 10*3/uL (ref 0.0–0.1)
BASOS PCT: 0 %
EOS ABS: 0 10*3/uL (ref 0.0–0.5)
Eosinophils Relative: 1 %
HEMATOCRIT: 32 % — AB (ref 34.8–46.6)
HEMOGLOBIN: 10.2 g/dL — AB (ref 11.6–15.9)
Lymphocytes Relative: 36 %
Lymphs Abs: 1.3 10*3/uL (ref 0.9–3.3)
MCH: 29.8 pg (ref 26.0–34.0)
MCHC: 31.9 g/dL — ABNORMAL LOW (ref 32.0–36.0)
MCV: 93.6 fL (ref 81.0–101.0)
MONOS PCT: 9 %
Monocytes Absolute: 0.3 10*3/uL (ref 0.1–0.9)
NEUTROS ABS: 1.9 10*3/uL (ref 1.5–6.5)
NEUTROS PCT: 54 %
Platelet Count: 178 10*3/uL (ref 145–400)
RBC: 3.42 MIL/uL — AB (ref 3.70–5.32)
RDW: 12.6 % (ref 11.1–15.7)
WBC: 3.5 10*3/uL — AB (ref 3.9–10.0)

## 2017-05-19 LAB — CMP (CANCER CENTER ONLY)
ALBUMIN: 3.8 g/dL (ref 3.5–5.0)
ALK PHOS: 71 U/L (ref 26–84)
ALT: 17 U/L (ref 10–47)
AST: 18 U/L (ref 11–38)
Anion gap: 6 (ref 5–15)
BILIRUBIN TOTAL: 0.7 mg/dL (ref 0.2–1.6)
BUN: 10 mg/dL (ref 7–22)
CALCIUM: 9.2 mg/dL (ref 8.0–10.3)
CO2: 29 mmol/L (ref 18–33)
CREATININE: 1.3 mg/dL — AB (ref 0.60–1.20)
Chloride: 108 mmol/L (ref 98–108)
Glucose, Bld: 111 mg/dL (ref 73–118)
Potassium: 3.7 mmol/L (ref 3.3–4.7)
SODIUM: 143 mmol/L (ref 128–145)
Total Protein: 7.6 g/dL (ref 6.4–8.1)

## 2017-05-19 NOTE — Progress Notes (Signed)
Hematology and Oncology Follow Up Visit  Karen [name] [id number] 1960-02-20 57 y.o. 05/19/2017   Principle Diagnosis:  Anemia of renal insufficiency Intermittent iron deficiency anemia  Current Therapy:   Aranesp 300 mcg subcutaneous for hemoglobin less than 10  IV iron as indicated   Interim History:  Karen [name] is here today for follow-up. She is having headaches lately. She states that she noted this once she was taken off of Haldol and start Risperdal without taper.  Hgb today is 10.2 with an MCV of 93. Iron studies are pending.  She has been taking ibuprofen daily for the headache. I advised that she use this in moderation and to take with food and to also follow-up with her neurologist to let them know about these symptoms.  She states that she is under some stress with work.  She fell over the weekend trying to kill a wasp. She is still sore in her left shoulder and leg but states that she was not seriously injured.  No fever, chills, n/v, cough, rash, dizziness, SOB, chest pain, palpitations, abdominal pain or changes in bowel or bladder habits.  No episodes of bleeding, no bruising or petechiae.  No swelling, tenderness, numbness or tingling in her extremities. No c/o pain.  She has maintained a good appetite and is staying well hydrated. Her weight is stable.   ECOG Performance Status: 1 - Symptomatic but completely ambulatory  Medications:  Allergies as of 05/19/2017      Reactions   Milk-related Compounds    Intolerance to milk products-causes gas      Medication List        Accurate as of 05/19/17  4:11 PM. Always use your most recent med list.          buPROPion 150 MG 24 hr tablet Commonly known as:  WELLBUTRIN XL Take 150 mg by mouth daily.   escitalopram 20 MG tablet Commonly known as:  LEXAPRO Take 20 mg by mouth daily.   KLOR-CON M20 20 MEQ tablet Generic drug:  potassium chloride SA TAKE 1 TABLET BY MOUTH ONCE A DAY   levothyroxine 125 MCG  tablet Commonly known as:  SYNTHROID, LEVOTHROID TAKE 1 TABLET (125 MCG TOTAL) BY MOUTH DAILY.   omeprazole 20 MG capsule Commonly known as:  PRILOSEC TAKE 1 CAPSULE BY MOUTH EVERY DAY   OVER THE COUNTER MEDICATION   risperiDONE 1 MG tablet Commonly known as:  RISPERDAL Take 1 mg by mouth at bedtime.   Vitamin D (Ergocalciferol) 50000 units Caps capsule Commonly known as:  DRISDOL Take 1 capsule (50,000 Units total) by mouth every 7 (seven) days. For 8 weeks. Then every 2 weeks.       Allergies:  Allergies  Allergen Reactions  . Milk-Related Compounds     Intolerance to milk products-causes gas    Past Medical History, Surgical history, Social history, and Family History were reviewed and updated.  Review of Systems: All other 10 point review of systems is negative.   Physical Exam:  vitals were not taken for this visit.   Wt Readings from Last 3 Encounters:  05/02/17 194 lb (88 kg)  04/18/17 194 lb (88 kg)  03/07/17 190 lb (86.2 kg)    Ocular: Sclerae unicteric, pupils equal, round and reactive to light Ear-nose-throat: Oropharynx clear, dentition fair Lymphatic: No cervical, supraclavicular or axillary adenopathy Lungs no rales or rhonchi, good excursion bilaterally Heart regular rate and rhythm, no murmur appreciated Abd soft, nontender, positive bowel sounds, no liver  or spleen tip palpated on exam, no fluid wave  MSK no focal spinal tenderness, no joint edema Neuro: non-focal, well-oriented, appropriate affect Breasts: Deferred   Lab Results  Component Value Date   WBC 3.5 (L) 05/19/2017   HGB 9.7 (L) 11/01/2016   HCT 32.0 (L) 05/19/2017   MCV 93.6 05/19/2017   PLT 178 05/19/2017   Lab Results  Component Value Date   FERRITIN 474 (H) 03/07/2017   IRON 98 03/07/2017   TIBC 231 (L) 03/07/2017   UIBC 133 03/07/2017   IRONPCTSAT 42 03/07/2017   Lab Results  Component Value Date   RETICCTPCT 0.9 03/07/2017   RBC 3.42 (L) 05/19/2017   RETICCTABS  26.4 11/18/2014   No results found for: KPAFRELGTCHN, LAMBDASER, KAPLAMBRATIO No results found for: IGGSERUM, IGA, IGMSERUM No results found for: Odetta Pink, SPEI   Chemistry      Component Value Date/Time   NA 143 05/19/2017 1517   NA 141 11/01/2016 1504   K 3.7 05/19/2017 1517   K 3.4 11/01/2016 1504   CL 108 05/19/2017 1517   CL 109 (H) 11/01/2016 1504   CO2 29 05/19/2017 1517   CO2 29 11/01/2016 1504   BUN 10 05/19/2017 1517   BUN 8 11/01/2016 1504   CREATININE 1.30 (H) 05/19/2017 1517   CREATININE 1.4 (H) 11/01/2016 1504      Component Value Date/Time   CALCIUM 9.2 05/19/2017 1517   CALCIUM 8.8 11/01/2016 1504   ALKPHOS 71 05/19/2017 1517   ALKPHOS 89 (H) 11/01/2016 1504   AST 18 05/19/2017 1517   ALT 17 05/19/2017 1517   ALT 16 11/01/2016 1504   BILITOT 0.7 05/19/2017 1517      Impression and Plan: Karen [name] is a very pleasant 57 yo [name] with both iron deficiency anemia and anemia secondary to chronic renal insufficiency.  Hgb is stable at 10.2. We will see what her iron studies show and bring her back in for infusion if needed.  We will plan to see her back in another 3 months for follow-up.  She will contact our office with any questions or concerns. We can certainly see her sooner if need be.   Laverna Peace, NP 4/15/20194:11 PM

## 2017-05-20 ENCOUNTER — Encounter: Payer: Self-pay | Admitting: Gastroenterology

## 2017-05-20 LAB — RETICULOCYTES
RBC.: 3.48 MIL/uL — AB (ref 3.70–5.45)
RETIC COUNT ABSOLUTE: 34.8 10*3/uL (ref 33.7–90.7)
RETIC CT PCT: 1 % (ref 0.7–2.1)

## 2017-05-20 LAB — IRON AND TIBC
Iron: 105 ug/dL (ref 41–142)
SATURATION RATIOS: 46 % (ref 21–57)
TIBC: 228 ug/dL — ABNORMAL LOW (ref 236–444)
UIBC: 123 ug/dL

## 2017-05-20 LAB — FERRITIN: Ferritin: 295 ng/mL — ABNORMAL HIGH (ref 9–269)

## 2017-06-06 ENCOUNTER — Other Ambulatory Visit: Payer: Managed Care, Other (non HMO)

## 2017-06-06 ENCOUNTER — Ambulatory Visit: Payer: Managed Care, Other (non HMO) | Admitting: Family

## 2017-06-06 ENCOUNTER — Ambulatory Visit: Payer: Managed Care, Other (non HMO)

## 2017-06-13 ENCOUNTER — Ambulatory Visit: Payer: Managed Care, Other (non HMO)

## 2017-06-13 ENCOUNTER — Other Ambulatory Visit: Payer: Managed Care, Other (non HMO)

## 2017-06-13 ENCOUNTER — Ambulatory Visit: Payer: Managed Care, Other (non HMO) | Admitting: Family

## 2017-08-18 ENCOUNTER — Encounter: Payer: Self-pay | Admitting: Family

## 2017-08-18 ENCOUNTER — Other Ambulatory Visit: Payer: Self-pay

## 2017-08-18 ENCOUNTER — Inpatient Hospital Stay: Payer: Managed Care, Other (non HMO) | Attending: Family | Admitting: Family

## 2017-08-18 ENCOUNTER — Inpatient Hospital Stay: Payer: Managed Care, Other (non HMO)

## 2017-08-18 VITALS — BP 134/65 | HR 66 | Temp 98.2°F | Resp 16 | Wt 202.0 lb

## 2017-08-18 DIAGNOSIS — D509 Iron deficiency anemia, unspecified: Secondary | ICD-10-CM | POA: Diagnosis not present

## 2017-08-18 DIAGNOSIS — D631 Anemia in chronic kidney disease: Secondary | ICD-10-CM

## 2017-08-18 DIAGNOSIS — N189 Chronic kidney disease, unspecified: Secondary | ICD-10-CM | POA: Insufficient documentation

## 2017-08-18 DIAGNOSIS — D500 Iron deficiency anemia secondary to blood loss (chronic): Secondary | ICD-10-CM

## 2017-08-18 DIAGNOSIS — N183 Chronic kidney disease, stage 3 unspecified: Secondary | ICD-10-CM

## 2017-08-18 LAB — CMP (CANCER CENTER ONLY)
ALK PHOS: 81 U/L (ref 38–126)
ALT: 10 U/L (ref 0–44)
ANION GAP: 9 (ref 5–15)
AST: 17 U/L (ref 15–41)
Albumin: 4 g/dL (ref 3.5–5.0)
BILIRUBIN TOTAL: 0.3 mg/dL (ref 0.3–1.2)
BUN: 12 mg/dL (ref 6–20)
CO2: 22 mmol/L (ref 22–32)
CREATININE: 1.42 mg/dL — AB (ref 0.44–1.00)
Calcium: 9.4 mg/dL (ref 8.9–10.3)
Chloride: 111 mmol/L (ref 98–111)
GFR, EST AFRICAN AMERICAN: 46 mL/min — AB (ref >60)
GFR, EST NON AFRICAN AMERICAN: 40 mL/min — AB (ref >60)
Glucose, Bld: 110 mg/dL — ABNORMAL HIGH (ref 70–99)
Potassium: 3.9 mmol/L (ref 3.5–5.1)
Sodium: 142 mmol/L (ref 135–145)
TOTAL PROTEIN: 7.6 g/dL (ref 6.5–8.1)

## 2017-08-18 LAB — CBC WITH DIFFERENTIAL (CANCER CENTER ONLY)
Basophils Absolute: 0 10*3/uL (ref 0.0–0.1)
Basophils Relative: 0 %
Eosinophils Absolute: 0.1 10*3/uL (ref 0.0–0.5)
Eosinophils Relative: 2 %
HEMATOCRIT: 32.1 % — AB (ref 34.8–46.6)
HEMOGLOBIN: 10 g/dL — AB (ref 11.6–15.9)
LYMPHS ABS: 1.3 10*3/uL (ref 0.9–3.3)
LYMPHS PCT: 39 %
MCH: 29.7 pg (ref 26.0–34.0)
MCHC: 31.2 g/dL — AB (ref 32.0–36.0)
MCV: 95.3 fL (ref 81.0–101.0)
MONOS PCT: 13 %
Monocytes Absolute: 0.4 10*3/uL (ref 0.1–0.9)
NEUTROS PCT: 46 %
Neutro Abs: 1.6 10*3/uL (ref 1.5–6.5)
Platelet Count: 193 10*3/uL (ref 145–400)
RBC: 3.37 MIL/uL — AB (ref 3.70–5.32)
RDW: 11.8 % (ref 11.1–15.7)
WBC: 3.4 10*3/uL — AB (ref 3.9–10.0)

## 2017-08-18 LAB — RETICULOCYTES
RBC.: 3.39 MIL/uL — ABNORMAL LOW (ref 3.70–5.45)
RETIC COUNT ABSOLUTE: 40.7 10*3/uL (ref 33.7–90.7)
RETIC CT PCT: 1.2 % (ref 0.7–2.1)

## 2017-08-18 NOTE — Progress Notes (Signed)
Hematology and Oncology Follow Up Visit  Karen [name] [id number] 1960/06/05 57 y.o. 08/18/2017   Principle Diagnosis:  Anemia of renal insufficiency Intermittent iron deficiency anemia  Current Therapy:   Aranesp 300 mcg subcutaneous for hemoglobin less than 10  IV iron as indicated   Interim History:  Ms. Karen [name] is here today for follow-up. She is doing well but still has episodes of mild fatigue. She is worried about weight gain.  She is eating healthy and staying hydrated. Her weight is up 7 lbs since her last visit in April.  No episodes of bleeding, no bruising or petechiae.  No fever, chills, n/v, cough, rash, dizziness, SOB, chest pain, palpitations, abdominal pain or changes in bowel or bladder habits.  No lymphadenopathy noted on exam.  No swelling, tenderness, numbness or tingling in her extremities at this time. No c/o pain.   ECOG Performance Status: 1 - Symptomatic but completely ambulatory  Medications:  Allergies as of 08/18/2017      Reactions   Milk-related Compounds    Intolerance to milk products-causes gas      Medication List        Accurate as of 08/18/17 12:59 PM. Always use your most recent med list.          buPROPion 150 MG 24 hr tablet Commonly known as:  WELLBUTRIN XL Take 150 mg by mouth daily.   escitalopram 20 MG tablet Commonly known as:  LEXAPRO Take 20 mg by mouth daily.   KLOR-CON M20 20 MEQ tablet Generic drug:  potassium chloride SA TAKE 1 TABLET BY MOUTH ONCE A DAY   levothyroxine 125 MCG tablet Commonly known as:  SYNTHROID, LEVOTHROID TAKE 1 TABLET (125 MCG TOTAL) BY MOUTH DAILY.   omeprazole 20 MG capsule Commonly known as:  PRILOSEC TAKE 1 CAPSULE BY MOUTH EVERY DAY   OVER THE COUNTER MEDICATION   risperiDONE 1 MG tablet Commonly known as:  RISPERDAL Take 1 mg by mouth at bedtime.   Vitamin D (Ergocalciferol) 50000 units Caps capsule Commonly known as:  DRISDOL Take 1 capsule (50,000 Units total) by mouth  every 7 (seven) days. For 8 weeks. Then every 2 weeks.   ZYLET 0.5-0.3 % Susp Generic drug:  Loteprednol-Tobramycin INSTILL 1 DROP INTO BOTH EYES 3 TIMES A DAY AS DIRECTED       Allergies:  Allergies  Allergen Reactions  . Milk-Related Compounds     Intolerance to milk products-causes gas    Past Medical History, Surgical history, Social history, and Family History were reviewed and updated.  Review of Systems: All other 10 point review of systems is negative.   Physical Exam:  weight is 202 lb (91.6 kg). Her oral temperature is 98.2 F (36.8 C). Her blood pressure is 134/65 and her pulse is 66. Her respiration is 16 and oxygen saturation is 100%.   Wt Readings from Last 3 Encounters:  08/18/17 202 lb (91.6 kg)  05/19/17 195 lb (88.5 kg)  05/02/17 194 lb (88 kg)    Ocular: Sclerae unicteric, pupils equal, round and reactive to light Ear-nose-throat: Oropharynx clear, dentition fair Lymphatic: No cervical, supraclavicular or axillary adenopathy Lungs no rales or rhonchi, good excursion bilaterally Heart regular rate and rhythm, no murmur appreciated Abd soft, nontender, positive bowel sounds, no liver or spleen tip palpated on exam, no fluid wave  MSK no focal spinal tenderness, no joint edema Neuro: non-focal, well-oriented, appropriate affect Breasts: Deferred   Lab Results  Component Value Date   WBC 3.4 (  L) 08/18/2017   HGB 10.0 (L) 08/18/2017   HCT 32.1 (L) 08/18/2017   MCV 95.3 08/18/2017   PLT 193 08/18/2017   Lab Results  Component Value Date   FERRITIN 295 (H) 05/19/2017   IRON 105 05/19/2017   TIBC 228 (L) 05/19/2017   UIBC 123 05/19/2017   IRONPCTSAT 46 05/19/2017   Lab Results  Component Value Date   RETICCTPCT 1.0 05/19/2017   RBC 3.37 (L) 08/18/2017   RETICCTABS 26.4 11/18/2014   No results found for: KPAFRELGTCHN, LAMBDASER, KAPLAMBRATIO No results found for: IGGSERUM, IGA, IGMSERUM No results found for: Ronnald Ramp, A1GS,  A2GS, Tillman Sers, SPEI   Chemistry      Component Value Date/Time   NA 143 05/19/2017 1517   NA 141 11/01/2016 1504   K 3.7 05/19/2017 1517   K 3.4 11/01/2016 1504   CL 108 05/19/2017 1517   CL 109 (H) 11/01/2016 1504   CO2 29 05/19/2017 1517   CO2 29 11/01/2016 1504   BUN 10 05/19/2017 1517   BUN 8 11/01/2016 1504   CREATININE 1.30 (H) 05/19/2017 1517   CREATININE 1.4 (H) 11/01/2016 1504      Component Value Date/Time   CALCIUM 9.2 05/19/2017 1517   CALCIUM 8.8 11/01/2016 1504   ALKPHOS 71 05/19/2017 1517   ALKPHOS 89 (H) 11/01/2016 1504   AST 18 05/19/2017 1517   ALT 17 05/19/2017 1517   ALT 16 11/01/2016 1504   BILITOT 0.7 05/19/2017 1517      Impression and Plan: Ms. Karen [name] is a very pleasant 57 yo [name] with both iron deficiency anemia and anemia secondary to chronic renal insufficiency.  Hgb today is 10 so no Aranesp needed this visit.  We will see what her iron studies show and bring her back in for infusion if needed.  She will contact our office with any questions or concerns. We can certainly see her sooner if need be.   Laverna Peace, NP 7/15/201912:59 PM

## 2017-08-19 LAB — IRON AND TIBC
IRON: 81 ug/dL (ref 41–142)
Saturation Ratios: 33 % (ref 21–57)
TIBC: 245 ug/dL (ref 236–444)
UIBC: 164 ug/dL

## 2017-08-19 LAB — FERRITIN: Ferritin: 340 ng/mL — ABNORMAL HIGH (ref 11–307)

## 2017-08-28 ENCOUNTER — Other Ambulatory Visit: Payer: Self-pay | Admitting: Family Medicine

## 2017-08-28 NOTE — Telephone Encounter (Signed)
Dr Jordan pt 

## 2017-09-02 ENCOUNTER — Other Ambulatory Visit: Payer: Self-pay | Admitting: Family Medicine

## 2017-09-11 ENCOUNTER — Other Ambulatory Visit: Payer: Self-pay | Admitting: Family Medicine

## 2017-09-12 ENCOUNTER — Telehealth: Payer: Self-pay | Admitting: Family Medicine

## 2017-09-12 NOTE — Telephone Encounter (Signed)
Copied from [place] [id number]. Topic: Inquiry >> Sep 12, 2017  9:40 AM Berneta Levins wrote: Reason for CRM:  Whitney with [facility] calling to see if she can get patients most recent labs sent over. Fax:  [phone] Phone:  [phone]

## 2017-09-16 NOTE — Telephone Encounter (Signed)
Recent labs faxed to [facility] at [facility] as requested. Correct phone number: [phone] and fax number: [phone].

## 2017-11-06 ENCOUNTER — Other Ambulatory Visit: Payer: Self-pay | Admitting: Family Medicine

## 2017-11-21 ENCOUNTER — Inpatient Hospital Stay: Payer: Managed Care, Other (non HMO)

## 2017-11-21 ENCOUNTER — Other Ambulatory Visit: Payer: Self-pay

## 2017-11-21 ENCOUNTER — Inpatient Hospital Stay: Payer: Managed Care, Other (non HMO) | Attending: Family | Admitting: Family

## 2017-11-21 ENCOUNTER — Encounter: Payer: Self-pay | Admitting: Family

## 2017-11-21 VITALS — BP 126/67 | HR 68 | Temp 98.2°F | Resp 18 | Wt 199.0 lb

## 2017-11-21 DIAGNOSIS — D631 Anemia in chronic kidney disease: Secondary | ICD-10-CM

## 2017-11-21 DIAGNOSIS — D500 Iron deficiency anemia secondary to blood loss (chronic): Secondary | ICD-10-CM

## 2017-11-21 DIAGNOSIS — N189 Chronic kidney disease, unspecified: Secondary | ICD-10-CM | POA: Insufficient documentation

## 2017-11-21 DIAGNOSIS — Z862 Personal history of diseases of the blood and blood-forming organs and certain disorders involving the immune mechanism: Secondary | ICD-10-CM | POA: Insufficient documentation

## 2017-11-21 DIAGNOSIS — N183 Chronic kidney disease, stage 3 unspecified: Secondary | ICD-10-CM

## 2017-11-21 DIAGNOSIS — D509 Iron deficiency anemia, unspecified: Secondary | ICD-10-CM

## 2017-11-21 LAB — CBC WITH DIFFERENTIAL (CANCER CENTER ONLY)
ABS IMMATURE GRANULOCYTES: 0 10*3/uL (ref 0.00–0.07)
BASOS PCT: 1 %
Basophils Absolute: 0 10*3/uL (ref 0.0–0.1)
Eosinophils Absolute: 0 10*3/uL (ref 0.0–0.5)
Eosinophils Relative: 1 %
HCT: 31.7 % — ABNORMAL LOW (ref 36.0–46.0)
HEMOGLOBIN: 9.6 g/dL — AB (ref 12.0–15.0)
IMMATURE GRANULOCYTES: 0 %
LYMPHS PCT: 44 %
Lymphs Abs: 1.5 10*3/uL (ref 0.7–4.0)
MCH: 28.2 pg (ref 26.0–34.0)
MCHC: 30.3 g/dL (ref 30.0–36.0)
MCV: 93.2 fL (ref 80.0–100.0)
Monocytes Absolute: 0.3 10*3/uL (ref 0.1–1.0)
Monocytes Relative: 10 %
NEUTROS ABS: 1.5 10*3/uL — AB (ref 1.7–7.7)
NEUTROS PCT: 44 %
PLATELETS: 226 10*3/uL (ref 150–400)
RBC: 3.4 MIL/uL — ABNORMAL LOW (ref 3.87–5.11)
RDW: 12.5 % (ref 11.5–15.5)
WBC: 3.3 10*3/uL — AB (ref 4.0–10.5)
nRBC: 0 % (ref 0.0–0.2)

## 2017-11-21 LAB — CMP (CANCER CENTER ONLY)
ALBUMIN: 3.7 g/dL (ref 3.5–5.0)
ALT: 20 U/L (ref 10–47)
AST: 32 U/L (ref 11–38)
Alkaline Phosphatase: 68 U/L (ref 26–84)
Anion gap: 0 — ABNORMAL LOW (ref 5–15)
BUN: 13 mg/dL (ref 7–22)
CO2: 28 mmol/L (ref 18–33)
CREATININE: 1.4 mg/dL — AB (ref 0.60–1.20)
Calcium: 9.3 mg/dL (ref 8.0–10.3)
Chloride: 111 mmol/L — ABNORMAL HIGH (ref 98–108)
GLUCOSE: 123 mg/dL — AB (ref 73–118)
Potassium: 4.3 mmol/L (ref 3.3–4.7)
Sodium: 139 mmol/L (ref 128–145)
TOTAL PROTEIN: 7.5 g/dL (ref 6.4–8.1)
Total Bilirubin: 0.8 mg/dL (ref 0.2–1.6)

## 2017-11-21 LAB — RETICULOCYTES
IMMATURE RETIC FRACT: 6 % (ref 2.3–15.9)
RBC.: 3.4 MIL/uL — ABNORMAL LOW (ref 3.87–5.11)
Retic Count, Absolute: 40.5 10*3/uL (ref 19.0–186.0)
Retic Ct Pct: 1.2 % (ref 0.4–3.1)

## 2017-11-21 NOTE — Progress Notes (Signed)
Hematology and Oncology Follow Up Visit  Karen [name] [id number] 04/27/1960 57 y.o. 11/21/2017   Principle Diagnosis:  Anemia of renal insufficiency Intermittent iron deficiency anemia  Current Therapy:   Aranesp 300 mcg subcutaneous for hemoglobin less than 10  IV iron as indicated   Interim History:  Ms. Purdie is here today for follow-up. She is doing well and has no complaints at this time.  She has had no issues with bleeding. No bruising or petechiae.  Hgb is 9.6, WBC count 3.3 and platelet count is 226.  No fever, chills, n/v, cough, rash, dizziness, SOB, chest pain, palpitations, abdominal pain or changes in bowel or bladder habits.  No swelling, tenderness, numbness or tingling in her extremities. No c/o pain.  No lymphadenopathy noted on exam.  She has maintained a good appetite and is staying well hydrated. Her weight is stable.   ECOG Performance Status: 1 - Symptomatic but completely ambulatory  Medications:  Allergies as of 11/21/2017      Reactions   Milk-related Compounds    Intolerance to milk products-causes gas      Medication List        Accurate as of 11/21/17  3:41 PM. Always use your most recent med list.          buPROPion 150 MG 24 hr tablet Commonly known as:  WELLBUTRIN XL Take 150 mg by mouth daily.   escitalopram 20 MG tablet Commonly known as:  LEXAPRO Take 20 mg by mouth daily.   levothyroxine 125 MCG tablet Commonly known as:  SYNTHROID, LEVOTHROID TAKE 1 TABLET (125 MCG TOTAL) BY MOUTH DAILY.   omeprazole 20 MG capsule Commonly known as:  PRILOSEC TAKE 1 CAPSULE BY MOUTH EVERY DAY   OVER THE COUNTER MEDICATION   risperiDONE 1 MG tablet Commonly known as:  RISPERDAL Take 1 mg by mouth at bedtime.   Vitamin D (Ergocalciferol) 50000 units Caps capsule Commonly known as:  DRISDOL TAKE 1 CAPSULE (50,000 UNITS TOTAL) BY MOUTH EVERY 7 (SEVEN) DAYS. FOR 8 WEEKS. THEN EVERY 2 WEEKS.       Allergies:  Allergies    Allergen Reactions  . Milk-Related Compounds     Intolerance to milk products-causes gas    Past Medical History, Surgical history, Social history, and Family History were reviewed and updated.  Review of Systems: All other 10 point review of systems is negative.   Physical Exam:  weight is 199 lb (90.3 kg). Her oral temperature is 98.2 F (36.8 C). Her blood pressure is 126/67 and her pulse is 68. Her respiration is 18 and oxygen saturation is 100%.   Wt Readings from Last 3 Encounters:  11/21/17 199 lb (90.3 kg)  08/18/17 202 lb (91.6 kg)  05/19/17 195 lb (88.5 kg)    Ocular: Sclerae unicteric, pupils equal, round and reactive to light Ear-nose-throat: Oropharynx clear, dentition fair Lymphatic: No cervical, supraclavicular or axillary adenopathy Lungs no rales or rhonchi, good excursion bilaterally Heart regular rate and rhythm, no murmur appreciated Abd soft, nontender, positive bowel sounds, no liver or spleen tip palpated on exam, no fluid wave  MSK no focal spinal tenderness, no joint edema Neuro: non-focal, well-oriented, appropriate affect Breasts: Deferred   Lab Results  Component Value Date   WBC 3.3 (L) 11/21/2017   HGB 9.6 (L) 11/21/2017   HCT 31.7 (L) 11/21/2017   MCV 93.2 11/21/2017   PLT 226 11/21/2017   Lab Results  Component Value Date   FERRITIN 340 (H) 08/18/2017  IRON 81 08/18/2017   TIBC 245 08/18/2017   UIBC 164 08/18/2017   IRONPCTSAT 33 08/18/2017   Lab Results  Component Value Date   RETICCTPCT 1.2 11/21/2017   RBC 3.40 (L) 11/21/2017   RBC 3.40 (L) 11/21/2017   RETICCTABS 26.4 11/18/2014   No results found for: KPAFRELGTCHN, LAMBDASER, KAPLAMBRATIO No results found for: IGGSERUM, IGA, IGMSERUM No results found for: Odetta Pink, SPEI   Chemistry      Component Value Date/Time   NA 139 11/21/2017 1501   NA 141 11/01/2016 1504   K 4.3 11/21/2017 1501   K 3.4 11/01/2016 1504    CL 111 (H) 11/21/2017 1501   CL 109 (H) 11/01/2016 1504   CO2 28 11/21/2017 1501   CO2 29 11/01/2016 1504   BUN 13 11/21/2017 1501   BUN 8 11/01/2016 1504   CREATININE 1.40 (H) 11/21/2017 1501   CREATININE 1.4 (H) 11/01/2016 1504      Component Value Date/Time   CALCIUM 9.3 11/21/2017 1501   CALCIUM 8.8 11/01/2016 1504   ALKPHOS 68 11/21/2017 1501   ALKPHOS 89 (H) 11/01/2016 1504   AST 32 11/21/2017 1501   ALT 20 11/21/2017 1501   ALT 16 11/01/2016 1504   BILITOT 0.8 11/21/2017 1501      Impression and Plan: Ms. Pellegrin is a very pleasant 57 yo [name] with iron deficiency anemia and anemia secondary to chronic renal insufficiency.  Hgb is stable at 9.6 so we will hold her Aranesp this visit. The injections are costing $1,000.00 each and this is hard for her. I had her meet with Baxter Flattery, our financial counselor, to discuss possible financial assistance.  We will see her back in another 2 months for follow-up.  She will contact our office with any questions or concerns. We can certainly see her sooner if need be.   Laverna Peace, NP 10/18/20193:41 PM

## 2017-11-24 ENCOUNTER — Ambulatory Visit (INDEPENDENT_AMBULATORY_CARE_PROVIDER_SITE_OTHER): Payer: Managed Care, Other (non HMO) | Admitting: Family Medicine

## 2017-11-24 ENCOUNTER — Encounter: Payer: Self-pay | Admitting: Family Medicine

## 2017-11-24 ENCOUNTER — Other Ambulatory Visit (HOSPITAL_COMMUNITY)
Admission: RE | Admit: 2017-11-24 | Discharge: 2017-11-24 | Disposition: A | Discharge status: Home / Self Care | Payer: Managed Care, Other (non HMO) | Source: Ambulatory Visit | Attending: Family Medicine | Admitting: Family Medicine

## 2017-11-24 VITALS — BP 102/70 | HR 60 | Temp 98.1°F | Resp 12 | Ht 70.0 in | Wt 196.2 lb

## 2017-11-24 DIAGNOSIS — Z131 Encounter for screening for diabetes mellitus: Secondary | ICD-10-CM

## 2017-11-24 DIAGNOSIS — Z0000 Encounter for general adult medical examination without abnormal findings: Secondary | ICD-10-CM

## 2017-11-24 DIAGNOSIS — N183 Chronic kidney disease, stage 3 unspecified: Secondary | ICD-10-CM

## 2017-11-24 DIAGNOSIS — Z13220 Encounter for screening for lipoid disorders: Secondary | ICD-10-CM | POA: Diagnosis not present

## 2017-11-24 DIAGNOSIS — E890 Postprocedural hypothyroidism: Secondary | ICD-10-CM | POA: Diagnosis not present

## 2017-11-24 DIAGNOSIS — Z23 Encounter for immunization: Secondary | ICD-10-CM | POA: Diagnosis not present

## 2017-11-24 DIAGNOSIS — R739 Hyperglycemia, unspecified: Secondary | ICD-10-CM

## 2017-11-24 DIAGNOSIS — Z124 Encounter for screening for malignant neoplasm of cervix: Secondary | ICD-10-CM | POA: Diagnosis not present

## 2017-11-24 LAB — IRON AND TIBC
Iron: 71 ug/dL (ref 41–142)
SATURATION RATIOS: 28 % (ref 21–57)
TIBC: 251 ug/dL (ref 236–444)
UIBC: 180 ug/dL

## 2017-11-24 LAB — FERRITIN: FERRITIN: 254 ng/mL (ref 11–307)

## 2017-11-24 NOTE — Assessment & Plan Note (Signed)
No changes in current management, will follow labs done today and will give further recommendations accordingly. Follow-up in 6 to 12 months.

## 2017-11-24 NOTE — Assessment & Plan Note (Signed)
Problem has been stable. Recommend low-salt diet and avoidance of NSAIDs. Adequate hydration. We will continue following annually.

## 2017-11-24 NOTE — Progress Notes (Signed)
HPI:   Karen [name] is a 57 y.o. female, who is here today for her routine physical.  Last CPE: 11/22/16  Regular exercise 3 or more time per week: None Following a healthy diet: Not consistently. She lives with her husband.  Chronic medical problems: Hypothyroidism, CKD 3, anemia, depression (she follows with Dr. Dorethea Clan), glaucoma,and hypokalemia.  She follows with oncologist due to chronic anemia. She is on Aranesp 300 mcg [place] prn in Hg < 10. She could not get treatment last visit because cost.  Lab Results  Component Value Date   WBC 3.3 (L) 11/21/2017   HGB 9.6 (L) 11/21/2017   HCT 31.7 (L) 11/21/2017   MCV 93.2 11/21/2017   PLT 226 11/21/2017   CKD III,she has not noted foam in urine,gross hematuria,or decreased urine output.  Lab Results  Component Value Date   CREATININE 1.40 (H) 11/21/2017   BUN 13 11/21/2017   NA 139 11/21/2017   K 4.3 11/21/2017   CL 111 (H) 11/21/2017   CO2 28 11/21/2017   Hypothyroidism,  S/P thyroidectomy. She is on Levothyroxine 125 mcg daily. Denies cold/heat intolerance,changes in bowel habits,or abnormal wt loss.  Last TSH normal at 0.5 (11/2016).  Pap smear 2017 Hx of abnormal pap smears: Denies Hx of STD's Denies.   Immunization History  Administered Date(s) Administered  . Influenza Split 11/06/2010, 12/18/2011  . Influenza Whole 10/22/2007, 10/28/2008, 11/02/2009  . Influenza,inj,Quad PF,6+ Mos 11/25/2012, 12/15/2013, 10/11/2015, 11/22/2016, 11/24/2017  . PPD Test 08/22/2014  . Pneumococcal Polysaccharide-23 11/22/2016  . Td 02/04/1998, 10/28/2008  . Zoster Recombinat (Shingrix) 11/24/2017    Mammogram: 04/2017. Colonoscopy: 11/2009. DEXA: N/A  Hep C screening: 11/2016 NR.  She has no concerns today.   Review of Systems  Constitutional: Negative for appetite change, fatigue, fever and unexpected weight change.  HENT: Negative for dental problem, hearing loss, nosebleeds, trouble swallowing and  voice change.   Eyes: Negative for photophobia and visual disturbance.  Respiratory: Negative for cough, shortness of breath and wheezing.   Cardiovascular: Negative for chest pain and leg swelling.  Gastrointestinal: Negative for abdominal pain, blood in stool, nausea and vomiting.       No changes in bowel habits.  Endocrine: Negative for cold intolerance, heat intolerance, polydipsia, polyphagia and polyuria.  Genitourinary: Negative for decreased urine volume, dysuria, hematuria, vaginal bleeding and vaginal discharge.       No breast tenderness or nipple discharge.  Musculoskeletal: Positive for arthralgias. Negative for gait problem.  Skin: Negative for rash.  Neurological: Negative for syncope, weakness and headaches.  Hematological: Negative for adenopathy. Does not bruise/bleed easily.  Psychiatric/Behavioral: Negative for confusion, sleep disturbance and suicidal ideas. The patient is not nervous/anxious.   All other systems reviewed and are negative.     Current Outpatient Medications on File Prior to Visit  Medication Sig Dispense Refill  . buPROPion (WELLBUTRIN XL) 150 MG 24 hr tablet Take 150 mg by mouth daily.    Marland Kitchen escitalopram (LEXAPRO) 20 MG tablet Take 20 mg by mouth daily.      Marland Kitchen levothyroxine (SYNTHROID, LEVOTHROID) 125 MCG tablet TAKE 1 TABLET (125 MCG TOTAL) BY MOUTH DAILY. 100 tablet 3  . omeprazole (PRILOSEC) 20 MG capsule TAKE 1 CAPSULE BY MOUTH EVERY DAY 30 capsule 5  . OVER THE COUNTER MEDICATION     . risperiDONE (RISPERDAL) 1 MG tablet Take 1 mg by mouth at bedtime.  1  . Vitamin D, Ergocalciferol, (DRISDOL) 50000 units CAPS capsule TAKE  1 CAPSULE (50,000 UNITS TOTAL) BY MOUTH EVERY 7 (SEVEN) DAYS. FOR 8 WEEKS. THEN EVERY 2 WEEKS. 4 capsule 5   Current Facility-Administered Medications on File Prior to Visit  Medication Dose Route Frequency Provider Last Rate Last Dose  . 0.9 %  sodium chloride infusion  500 mL Intravenous Once Ladene Artist, MD          Past Medical History:  Diagnosis Date  . Anemia   . Barrett esophagus   . Depression   . DUB (dysfunctional uterine bleeding)   . GERD (gastroesophageal reflux disease)   . Graves disease   . Hypothyroidism     Past Surgical History:  Procedure Laterality Date  . CHOLECYSTECTOMY N/A 07/16/2012   Procedure: LAPAROSCOPIC CHOLECYSTECTOMY WITH INTRAOPERATIVE CHOLANGIOGRAM;  Surgeon: Joyice Faster. Cornett, MD;  Location: [place];  Service: General;  Laterality: N/A;  . COLONOSCOPY    . FOOT SURGERY     Left  . UPPER GASTROINTESTINAL ENDOSCOPY      Allergies  Allergen Reactions  . Milk-Related Compounds     Intolerance to milk products-causes gas    Family History  Problem Relation Age of Onset  . Hypertension Mother   . Asthma Mother   . Cancer Father 2       larynx cancer   . Breast cancer Maternal Aunt 60  . Colon cancer Neg Hx   . Colon polyps Neg Hx   . Esophageal cancer Neg Hx   . Rectal cancer Neg Hx   . Stomach cancer Neg Hx     Social History   Socioeconomic History  . Marital status: Married    Spouse name: Not on file  . Number of children: 0  . Years of education: Not on file  . Highest education level: Not on file  Occupational History    Employer: [employer]  . Financial resource strain: Not on file  . Food insecurity:    Worry: Not on file    Inability: Not on file  . Transportation needs:    Medical: Not on file    Non-medical: Not on file  Tobacco Use  . Smoking status: Former Smoker    Packs/day: 1.00    Years: 20.00    Pack years: 20.00    Types: Cigarettes    Start date: 03/13/1967    Last attempt to quit: 07/14/1987    Years since quitting: 30.3  . Smokeless tobacco: Never Used  . Tobacco comment: quit 26 years ago  Substance and Sexual Activity  . Alcohol use: No    Alcohol/week: 0.0 standard drinks  . Drug use: No  . Sexual activity: Not on file  Lifestyle  . Physical activity:    Days per week: Not on file     Minutes per session: Not on file  . Stress: Not on file  Relationships  . Social connections:    Talks on phone: Not on file    Gets together: Not on file    Attends religious service: Not on file    Active member of club or organization: Not on file    Attends meetings of clubs or organizations: Not on file    Relationship status: Not on file  Other Topics Concern  . Not on file  Social History Narrative   Occupation: Freight forwarder   Regular exercise- yes   0 caffeine drinks      Vitals:   11/24/17 1534  BP: 102/70  Pulse: 60  Resp:  12  Temp: 98.1 F (36.7 C)  SpO2: 97%   Body mass index is 28.15 kg/m.   Wt Readings from Last 3 Encounters:  11/24/17 196 lb 3.2 oz (89 kg)  11/21/17 199 lb (90.3 kg)  08/18/17 202 lb (91.6 kg)    Physical Exam  Nursing note and vitals reviewed. Constitutional: She is oriented to person, place, and time. She appears well-developed. No distress.  HENT:  Head: Normocephalic and atraumatic.  Right Ear: Hearing, tympanic membrane, external ear and ear canal normal.  Left Ear: Hearing, tympanic membrane, external ear and ear canal normal.  Mouth/Throat: Uvula is midline, oropharynx is clear and moist and mucous membranes are normal.  Eyes: Pupils are equal, round, and reactive to light. Conjunctivae and EOM are normal.  Neck: No tracheal deviation present. No thyromegaly present.  Cardiovascular: Normal rate and regular rhythm.  No murmur heard. Pulses:      Dorsalis pedis pulses are 2+ on the right side, and 2+ on the left side.       Posterior tibial pulses are 2+ on the right side, and 2+ on the left side.  Respiratory: Effort normal and breath sounds normal. No respiratory distress.  GI: Soft. She exhibits no mass. There is no hepatomegaly. There is no tenderness.  Genitourinary: There is no rash, tenderness or lesion on the right labia. There is no rash, tenderness or lesion on the left labia. Uterus is not enlarged and not  tender. Cervix exhibits no motion tenderness, no discharge and no friability. Right adnexum displays no mass, no tenderness and no fullness. Left adnexum displays no mass, no tenderness and no fullness. No erythema or bleeding in the vagina. No vaginal discharge found.  Genitourinary Comments: Breast: No masses, skin abnormalities, or nipple discharge appreciated bilateral. Pap smear collected.   Musculoskeletal: She exhibits no edema.  No major deformity or signs of synovitis appreciated.  Lymphadenopathy:    She has no cervical adenopathy.       Right: No inguinal and no supraclavicular adenopathy present.       Left: No inguinal and no supraclavicular adenopathy present.  Neurological: She is alert and oriented to person, place, and time. She has normal strength. No cranial nerve deficit. Coordination and gait normal.  Reflex Scores:      Bicep reflexes are 2+ on the right side and 2+ on the left side.      Patellar reflexes are 2+ on the right side and 2+ on the left side. Skin: Skin is warm. No rash noted. No erythema.  Psychiatric: She has a normal mood and affect. Cognition and memory are normal.  Well groomed, good eye contact.    ASSESSMENT AND PLAN:  Ms. JANALEE GROBE was here today annual physical examination.   Orders Placed This Encounter  Procedures  . Flu Vaccine QUAD 6+ mos PF IM (Fluarix Quad PF)  . Varicella-zoster vaccine IM (Shingrix)  . Hemoglobin A1c  . Lipid panel    Lab Results  Component Value Date   CHOL 186 11/24/2017   HDL 55.90 11/24/2017   LDLCALC 116 (H) 11/24/2017   LDLDIRECT 148.9 10/25/2010   TRIG 70.0 11/24/2017   CHOLHDL 3 11/24/2017   Lab Results  Component Value Date   HGBA1C 4.9 11/24/2017    Routine general medical examination at a health care facility We discussed the importance of regular physical activity and healthy diet for prevention of chronic illness and/or complications. Preventive guidelines reviewed. Vaccination up  to date.  Ca++ and vit D supplementation recommended. Next CPE in a year.  The 10-year ASCVD risk score Mikey Bussing DC Brooke Bonito., et al., 2013) is: 2%   Values used to calculate the score:     Age: 75 years     Sex: Female     Is Non-Hispanic African American: Yes     Diabetic: No     Tobacco smoker: No     Systolic Blood Pressure: 301 mmHg     Is BP treated: No     HDL Cholesterol: 55.9 mg/dL     Total Cholesterol: 186 mg/dL  Needs flu shot -     Flu Vaccine QUAD 6+ mos PF IM (Fluarix Quad PF)  Need for shingles vaccine -     Varicella-zoster vaccine IM (Shingrix)  Hyperglycemia 11/2016 Glucose 123. Healthy life style for primary prevention recommended.  Diabetes mellitus screening -     Hemoglobin A1c  Screening for lipid disorders -     Lipid panel  Cervical cancer screening -     Cytology - PAP ([place])   Hypothyroidism No changes in current management, will follow labs done today and will give further recommendations accordingly. Follow-up in 6 to 12 months.  Chronic kidney disease Problem has been stable. Recommend low-salt diet and avoidance of NSAIDs. Adequate hydration. We will continue following annually.    Return in 1 year (on 11/25/2018) for CPE.    Jerlyn Pain G. [name], MD  [facility]. [place] office.

## 2017-11-24 NOTE — Patient Instructions (Addendum)
A few things to remember from today's visit:   Routine general medical examination at a health care facility  Needs flu shot - Plan: Flu Vaccine QUAD 6+ mos PF IM (Fluarix Quad PF)  Need for shingles vaccine - Plan: Varicella-zoster vaccine IM (Shingrix)  Postablative hypothyroidism  Stage 3 chronic kidney disease ([place])  Hyperglycemia  Diabetes mellitus screening - Plan: Hemoglobin A1c  Screening for lipid disorders - Plan: Lipid panel  Cervical cancer screening - Plan: Cytology - PAP ([place])  Today you have you routine preventive visit.  At least 150 minutes of moderate exercise per week, daily brisk walking for 15-30 min is a good exercise option. Healthy diet low in saturated (animal) fats and sweets and consisting of fresh fruits and vegetables, lean meats such as fish and white chicken and whole grains.  These are some of recommendations for screening depending of age and risk factors:   - Vaccines:  Tdap vaccine every 10 years.  Shingles vaccine recommended at age 45, could be given after 58 years of age but not sure about insurance coverage.   Pneumonia vaccines:  Prevnar 13 at 65 and Pneumovax at 24. Sometimes Pneumovax is giving earlier if history of smoking, lung disease,diabetes,kidney disease among some.    Screening for diabetes at age 30 and every 3 years.  Cervical cancer prevention:  Pap smear starts at 58 years of age and continues periodically until 58 years old in low risk women. Pap smear every 3 years between 29 and 88 years old. Pap smear every 3-5 years between women 16 and older if pap smear negative and HPV screening negative.   -Breast cancer: Mammogram: There is disagreement between experts about when to start screening in low risk asymptomatic female but recent recommendations are to start screening at 64 and not later than 58 years old , every 1-2 years and after 58 yo q 2 years. Screening is recommended until 58 years old but some  women can continue screening depending of healthy issues.   Colon cancer screening: starts at 58 years old until 58 years old.  Cholesterol disorder screening at age 41 and every 3 years.  Also recommended:  1. Dental visit- Brush and floss your teeth twice daily; visit your dentist twice a year. 2. Eye doctor- Get an eye exam at least every 2 years. 3. Helmet use- Always wear a helmet when riding a bicycle, motorcycle, rollerblading or skateboarding. 4. Safe sex- If you may be exposed to sexually transmitted infections, use a condom. 5. Seat belts- Seat belts can save your live; always wear one. 6. Smoke/Carbon Monoxide detectors- These detectors need to be installed on the appropriate level of your home. Replace batteries at least once a year. 7. Skin cancer- When out in the sun please cover up and use sunscreen 15 SPF or higher. 8. Violence- If anyone is threatening or hurting you, please tell your healthcare provider.  9. Drink alcohol in moderation- Limit alcohol intake to one drink or less per day. Never drink and drive.  Please be sure medication list is accurate. If a new problem present, please set up appointment sooner than planned today.

## 2017-11-25 LAB — LIPID PANEL
CHOLESTEROL: 186 mg/dL (ref 0–200)
HDL: 55.9 mg/dL (ref >39.00)
LDL CALC: 116 mg/dL — AB (ref 0–99)
NonHDL: 130.09
TRIGLYCERIDES: 70 mg/dL (ref 0.0–149.0)
Total CHOL/HDL Ratio: 3
VLDL: 14 mg/dL (ref 0.0–40.0)

## 2017-11-25 LAB — HEMOGLOBIN A1C: Hgb A1c MFr Bld: 4.9 % (ref 4.6–6.5)

## 2017-11-26 LAB — CYTOLOGY - PAP
DIAGNOSIS: NEGATIVE
HPV (WINDOPATH): NOT DETECTED

## 2017-11-27 ENCOUNTER — Telehealth: Payer: Self-pay | Admitting: *Deleted

## 2017-11-27 ENCOUNTER — Inpatient Hospital Stay: Payer: Managed Care, Other (non HMO)

## 2017-11-27 VITALS — BP 117/55 | HR 71 | Temp 98.2°F | Resp 16

## 2017-11-27 DIAGNOSIS — D500 Iron deficiency anemia secondary to blood loss (chronic): Secondary | ICD-10-CM

## 2017-11-27 MED ORDER — LEVOTHYROXINE SODIUM 125 MCG PO TABS: ORAL_TABLET | ORAL | 1 refills | Status: DC

## 2017-11-27 MED ORDER — DARBEPOETIN ALFA 300 MCG/0.6ML IJ SOSY: 300.0000 ug | PREFILLED_SYRINGE | Freq: Once | INTRAMUSCULAR | Status: DC

## 2017-11-27 NOTE — Telephone Encounter (Signed)
Patient wanted to hold her Aranesp last week when she was seen due to cost. She is calling this morning wanting to schedule the Aranesp due to symptoms. Appointment made for this afternoon.

## 2017-11-27 NOTE — Progress Notes (Signed)
Patient aware that due to current lab status, insurance is NOT covering [place] and she was provided with information regarding cost to make an informed decision to get the shot today and pay for it out of pocket or wait until she returns in December for more lab tests. She was also made aware once she on her own decided to wait, that if she feels worse prior to the scheduled Dec appt, she can always call here and we can see her sooner than that. She voiced that is what she wishes to do today.

## 2017-12-01 ENCOUNTER — Other Ambulatory Visit (INDEPENDENT_AMBULATORY_CARE_PROVIDER_SITE_OTHER): Payer: Managed Care, Other (non HMO)

## 2017-12-01 DIAGNOSIS — E890 Postprocedural hypothyroidism: Secondary | ICD-10-CM | POA: Diagnosis not present

## 2017-12-01 LAB — T4, FREE: Free T4: 0.71 ng/dL (ref 0.60–1.60)

## 2017-12-01 LAB — TSH: TSH: 0.85 u[IU]/mL (ref 0.35–4.50)

## 2018-01-16 ENCOUNTER — Encounter: Payer: Self-pay | Admitting: Family

## 2018-01-16 ENCOUNTER — Inpatient Hospital Stay: Payer: Managed Care, Other (non HMO) | Attending: Family | Admitting: Family

## 2018-01-16 ENCOUNTER — Other Ambulatory Visit: Payer: Self-pay

## 2018-01-16 ENCOUNTER — Inpatient Hospital Stay: Payer: Managed Care, Other (non HMO)

## 2018-01-16 VITALS — BP 140/69 | HR 62 | Temp 98.2°F | Resp 17 | Ht 70.0 in | Wt 205.5 lb

## 2018-01-16 DIAGNOSIS — D500 Iron deficiency anemia secondary to blood loss (chronic): Secondary | ICD-10-CM

## 2018-01-16 DIAGNOSIS — N189 Chronic kidney disease, unspecified: Secondary | ICD-10-CM

## 2018-01-16 DIAGNOSIS — D631 Anemia in chronic kidney disease: Secondary | ICD-10-CM | POA: Diagnosis not present

## 2018-01-16 DIAGNOSIS — Z79899 Other long term (current) drug therapy: Secondary | ICD-10-CM

## 2018-01-16 DIAGNOSIS — D509 Iron deficiency anemia, unspecified: Secondary | ICD-10-CM

## 2018-01-16 LAB — CBC WITH DIFFERENTIAL (CANCER CENTER ONLY)
Abs Immature Granulocytes: 0 10*3/uL (ref 0.00–0.07)
BASOS PCT: 0 %
Basophils Absolute: 0 10*3/uL (ref 0.0–0.1)
EOS ABS: 0 10*3/uL (ref 0.0–0.5)
Eosinophils Relative: 1 %
HCT: 31.3 % — ABNORMAL LOW (ref 36.0–46.0)
Hemoglobin: 9.4 g/dL — ABNORMAL LOW (ref 12.0–15.0)
Immature Granulocytes: 0 %
Lymphocytes Relative: 41 %
Lymphs Abs: 1.2 10*3/uL (ref 0.7–4.0)
MCH: 28.6 pg (ref 26.0–34.0)
MCHC: 30 g/dL (ref 30.0–36.0)
MCV: 95.1 fL (ref 80.0–100.0)
MONOS PCT: 11 %
Monocytes Absolute: 0.3 10*3/uL (ref 0.1–1.0)
NEUTROS PCT: 47 %
NRBC: 0 % (ref 0.0–0.2)
Neutro Abs: 1.4 10*3/uL — ABNORMAL LOW (ref 1.7–7.7)
PLATELETS: 206 10*3/uL (ref 150–400)
RBC: 3.29 MIL/uL — AB (ref 3.87–5.11)
RDW: 12.9 % (ref 11.5–15.5)
WBC: 2.9 10*3/uL — AB (ref 4.0–10.5)

## 2018-01-16 LAB — CMP (CANCER CENTER ONLY)
ALT: 10 U/L (ref 0–44)
AST: 12 U/L — AB (ref 15–41)
Albumin: 4 g/dL (ref 3.5–5.0)
Alkaline Phosphatase: 65 U/L (ref 38–126)
Anion gap: 6 (ref 5–15)
BUN: 11 mg/dL (ref 6–20)
CHLORIDE: 103 mmol/L (ref 98–111)
CO2: 25 mmol/L (ref 22–32)
Calcium: 8.9 mg/dL (ref 8.9–10.3)
Creatinine: 1.02 mg/dL — ABNORMAL HIGH (ref 0.44–1.00)
Glucose, Bld: 75 mg/dL (ref 70–99)
POTASSIUM: 3.6 mmol/L (ref 3.5–5.1)
Sodium: 134 mmol/L — ABNORMAL LOW (ref 135–145)
Total Bilirubin: 0.5 mg/dL (ref 0.3–1.2)
Total Protein: 6.9 g/dL (ref 6.5–8.1)

## 2018-01-16 LAB — RETICULOCYTES
IMMATURE RETIC FRACT: 10.4 % (ref 2.3–15.9)
RBC.: 3.29 MIL/uL — AB (ref 3.87–5.11)
RETIC CT PCT: 1.4 % (ref 0.4–3.1)
Retic Count, Absolute: 45.4 10*3/uL (ref 19.0–186.0)

## 2018-01-16 MED ORDER — DARBEPOETIN ALFA 300 MCG/0.6ML IJ SOSY
PREFILLED_SYRINGE | INTRAMUSCULAR | Status: AC
  Filled 2018-01-16: qty 0.6

## 2018-01-16 MED ORDER — DARBEPOETIN ALFA 300 MCG/0.6ML IJ SOSY: 300.0000 ug | PREFILLED_SYRINGE | Freq: Once | INTRAMUSCULAR | Status: DC

## 2018-01-17 LAB — ERYTHROPOIETIN: Erythropoietin: 11.1 m[IU]/mL (ref 2.6–18.5)

## 2018-01-18 NOTE — Progress Notes (Signed)
Hematology and Oncology Follow Up Visit  Karen [name] [id number] 02-17-1960 57 y.o. 01/18/2018   Principle Diagnosis:  Anemia of renal insufficiency Intermittent iron deficiency anemia  Current Therapy:   Aranesp 300 mcg subcutaneous for hemoglobin less than 10  IV iron as indicated   Interim History:  Karen [name] is here today for follow-up. She is doing well and has no complaints at this time. She denies fatigue.  Hgb is 9.4. Unfortunately, her insurance does not make Aranesp affordable. Each injection costs her $2000. We ae going to look for a more accessible option for her.  She has had no episodes of bleeding. No bruising or petechiae.  No fever, chills, n/v, cough, rash, dizziness, SOB, chest pain, palpitations, abdominal pain or changes in bowel or bladder habits.  No swelling, tenderness, numbness or tingling in her extremities. No c/o pain.  No lymphadenopathy noted on exam.  She has maintained a good appetite and is staying well hydrated. Her weight is stable.   ECOG Performance Status: 1 - Symptomatic but completely ambulatory  Medications:  Allergies as of 01/16/2018      Reactions   Milk-related Compounds    Intolerance to milk products-causes gas      Medication List       Accurate as of January 16, 2018 11:59 PM. Always use your most recent med list.        buPROPion 150 MG 24 hr tablet Commonly known as:  WELLBUTRIN XL Take 150 mg by mouth daily.   escitalopram 20 MG tablet Commonly known as:  LEXAPRO Take 20 mg by mouth daily.   levothyroxine 125 MCG tablet Commonly known as:  SYNTHROID, LEVOTHROID TAKE 1 TABLET (125 MCG TOTAL) BY MOUTH DAILY.   omeprazole 20 MG capsule Commonly known as:  PRILOSEC TAKE 1 CAPSULE BY MOUTH EVERY DAY   OVER THE COUNTER MEDICATION   risperiDONE 1 MG tablet Commonly known as:  RISPERDAL Take 1 mg by mouth at bedtime.   Vitamin D (Ergocalciferol) 1.25 MG (50000 UT) Caps capsule Commonly known as:   DRISDOL TAKE 1 CAPSULE (50,000 UNITS TOTAL) BY MOUTH EVERY 7 (SEVEN) DAYS. FOR 8 WEEKS. THEN EVERY 2 WEEKS.       Allergies:  Allergies  Allergen Reactions  . Milk-Related Compounds     Intolerance to milk products-causes gas    Past Medical History, Surgical history, Social history, and Family History were reviewed and updated.  Review of Systems: All other 10 point review of systems is negative.   Physical Exam:  height is 5\' 10"  (1.778 m) and weight is 205 lb 8 oz (93.2 kg). Her temperature is 98.2 F (36.8 C). Her blood pressure is 140/69 and her pulse is 62. Her respiration is 17 and oxygen saturation is 100%.   Wt Readings from Last 3 Encounters:  01/16/18 205 lb 8 oz (93.2 kg)  11/24/17 196 lb 3.2 oz (89 kg)  11/21/17 199 lb (90.3 kg)    Ocular: Sclerae unicteric, pupils equal, round and reactive to light Ear-nose-throat: Oropharynx clear, dentition fair Lymphatic: No cervical, supraclavicular or axillary adenopathy Lungs no rales or rhonchi, good excursion bilaterally Heart regular rate and rhythm, no murmur appreciated Abd soft, nontender, positive bowel sounds, no liver or spleen tip palpated on exam, no fluid wave  MSK no focal spinal tenderness, no joint edema Neuro: non-focal, well-oriented, appropriate affect Breasts: Deferred  Lab Results  Component Value Date   WBC 2.9 (L) 01/16/2018   HGB 9.4 (L) 01/16/2018  HCT 31.3 (L) 01/16/2018   MCV 95.1 01/16/2018   PLT 206 01/16/2018   Lab Results  Component Value Date   FERRITIN 254 11/21/2017   IRON 71 11/21/2017   TIBC 251 11/21/2017   UIBC 180 11/21/2017   IRONPCTSAT 28 11/21/2017   Lab Results  Component Value Date   RETICCTPCT 1.4 01/16/2018   RBC 3.29 (L) 01/16/2018   RBC 3.29 (L) 01/16/2018   RETICCTABS 26.4 11/18/2014   No results found for: KPAFRELGTCHN, LAMBDASER, KAPLAMBRATIO No results found for: IGGSERUM, IGA, IGMSERUM No results found for: Kathrynn Ducking, MSPIKE, SPEI   Chemistry      Component Value Date/Time   NA 134 (L) 01/16/2018 1509   NA 141 11/01/2016 1504   K 3.6 01/16/2018 1509   K 3.4 11/01/2016 1504   CL 103 01/16/2018 1509   CL 109 (H) 11/01/2016 1504   CO2 25 01/16/2018 1509   CO2 29 11/01/2016 1504   BUN 11 01/16/2018 1509   BUN 8 11/01/2016 1504   CREATININE 1.02 (H) 01/16/2018 1509   CREATININE 1.4 (H) 11/01/2016 1504      Component Value Date/Time   CALCIUM 8.9 01/16/2018 1509   CALCIUM 8.8 11/01/2016 1504   ALKPHOS 65 01/16/2018 1509   ALKPHOS 89 (H) 11/01/2016 1504   AST 12 (L) 01/16/2018 1509   ALT 10 01/16/2018 1509   ALT 16 11/01/2016 1504   BILITOT 0.5 01/16/2018 1509       Impression and Plan: Karen [name] is a very pleasant 41 [name] with iron deficiency anemia and anemia secondary to chronic renal insufficiency with low erythropoietin level.   She is doing well and has no complaints at this time.  Her Hgb is still low at 9.4, MCV 95.  We will see what other more affordable options there are since Aranesp is so expensive for her.  We will go ahead and plan to see her back in another 2 months.  She will contact our office with any questions or concerns. We can certainly see her sooner if need be.   Laverna Peace, NP 12/15/201910:03 PM

## 2018-01-19 LAB — FERRITIN: FERRITIN: 266 ng/mL (ref 11–307)

## 2018-01-19 LAB — IRON AND TIBC
Iron: 79 ug/dL (ref 41–142)
Saturation Ratios: 32 % (ref 21–57)
TIBC: 243 ug/dL (ref 236–444)
UIBC: 164 ug/dL (ref 120–384)

## 2018-01-23 ENCOUNTER — Ambulatory Visit (INDEPENDENT_AMBULATORY_CARE_PROVIDER_SITE_OTHER): Payer: Managed Care, Other (non HMO)

## 2018-01-23 DIAGNOSIS — Z23 Encounter for immunization: Secondary | ICD-10-CM

## 2018-02-11 ENCOUNTER — Encounter: Payer: Self-pay | Admitting: Family Medicine

## 2018-02-11 ENCOUNTER — Ambulatory Visit (INDEPENDENT_AMBULATORY_CARE_PROVIDER_SITE_OTHER): Payer: Managed Care, Other (non HMO) | Admitting: Family Medicine

## 2018-02-11 ENCOUNTER — Encounter: Payer: Self-pay | Admitting: *Deleted

## 2018-02-11 VITALS — BP 130/86 | HR 80 | Temp 98.7°F | Resp 12 | Ht 70.0 in | Wt 211.0 lb

## 2018-02-11 DIAGNOSIS — J029 Acute pharyngitis, unspecified: Secondary | ICD-10-CM

## 2018-02-11 DIAGNOSIS — L509 Urticaria, unspecified: Secondary | ICD-10-CM | POA: Diagnosis not present

## 2018-02-11 LAB — POCT RAPID STREP A (OFFICE): Rapid Strep A Screen: NEGATIVE

## 2018-02-11 MED ORDER — PREDNISONE 20 MG PO TABS: ORAL_TABLET | ORAL | 0 refills | Status: AC

## 2018-02-11 MED ORDER — METHYLPREDNISOLONE ACETATE 40 MG/ML IJ SUSP
40.0000 mg | Freq: Once | INTRAMUSCULAR | Status: AC
  Administered 2018-02-11: 40 mg via INTRAMUSCULAR

## 2018-02-11 NOTE — Patient Instructions (Addendum)
A few things to remember from today's visit:   Acute pharyngitis, unspecified etiology  Urticaria - Plan: predniSONE (DELTASONE) 20 MG tablet  Pepcid 20 mg twice daily. Take prednisone with breakfast. Zyrtec 10 mg twice daily for 7 to 10 days.   Symptomatic treatment: Over the counter Acetaminophen 500 mg and/or Ibuprofen (400-600 mg) if there is not contraindications; you can alternate in between both every 4-6 hours. Gargles with saline water and throat lozenges might also help. Cold fluids.    Seek prompt medical evaluation if you are having difficulty breathing, mouth swelling, throat closing up, not able to swallow liquids (drooling), skin rash/bruising, or worsening symptoms.  Please follow up in 2 weeks if not any better.    Please be sure medication list is accurate. If a new problem present, please set up appointment sooner than planned today.

## 2018-02-11 NOTE — Progress Notes (Signed)
ACUTE VISIT   HPI:  Chief Complaint  Patient presents with  . Urticaria    started Monday night, all over body    [name] is a 58 y.o. female, who is here today complaining of 2-3 days of very pruritic constant skin rash  Gradual onset. She has not identified alleviating or exacerbating factors.  A few days before rash presented she started taking Folic acid, Cyproheptadine-Lysine syrup, and received Shingrix 2nd dose. She noted an erythematous lesion on upper chest a few days after receiving 1st dose. Pruritus is keeping her from sleep.  No known insect bite or outdoor exposures to plants. No sick contact. No Hx of eczema or similar rash in the past.  Problem is getting worse.   OTC medication for this problem: Wal-dry 2 tabs at 4 Am.    She denies oral lesions/edema but feels like her throat is narrow. Yesterday she started with sore throat. Negative for fever,chills,or body aches.  Negative for stridor,cough, wheezing, dyspnea, abdominal pain, nausea, or vomiting.    Review of Systems  Constitutional: Positive for fatigue. Negative for appetite change, chills and fever.  HENT: Positive for sore throat. Negative for congestion, ear pain, mouth sores, nosebleeds, rhinorrhea, sneezing, trouble swallowing and voice change.   Eyes: Negative for discharge and redness.  Respiratory: Negative for cough, shortness of breath and wheezing.   Cardiovascular: Negative for chest pain, palpitations and leg swelling.  Gastrointestinal: Negative for abdominal pain, diarrhea, nausea and vomiting.  Musculoskeletal: Negative for joint swelling and myalgias.  Skin: Positive for rash.  Allergic/Immunologic: Negative for environmental allergies and food allergies.  Neurological: Negative for syncope, weakness, numbness and headaches.  Psychiatric/Behavioral: Positive for sleep disturbance. Negative for confusion. The patient is nervous/anxious.       Current  Outpatient Medications on File Prior to Visit  Medication Sig Dispense Refill  . buPROPion (WELLBUTRIN XL) 150 MG 24 hr tablet Take 150 mg by mouth daily.    Marland Kitchen escitalopram (LEXAPRO) 20 MG tablet Take 20 mg by mouth daily.      Marland Kitchen FOLIC ACID PO Take by mouth daily.    Marland Kitchen levothyroxine (SYNTHROID, LEVOTHROID) 125 MCG tablet TAKE 1 TABLET (125 MCG TOTAL) BY MOUTH DAILY. 90 tablet 1  . omeprazole (PRILOSEC) 20 MG capsule TAKE 1 CAPSULE BY MOUTH EVERY DAY 30 capsule 5  . OVER THE COUNTER MEDICATION     . risperiDONE (RISPERDAL) 1 MG tablet Take 1 mg by mouth at bedtime.  1  . Vitamin D, Ergocalciferol, (DRISDOL) 50000 units CAPS capsule TAKE 1 CAPSULE (50,000 UNITS TOTAL) BY MOUTH EVERY 7 (SEVEN) DAYS. FOR 8 WEEKS. THEN EVERY 2 WEEKS. 4 capsule 5   Current Facility-Administered Medications on File Prior to Visit  Medication Dose Route Frequency Provider Last Rate Last Dose  . 0.9 %  sodium chloride infusion  500 mL Intravenous Once Ladene Artist, MD         Past Medical History:  Diagnosis Date  . Anemia   . Barrett esophagus   . Depression   . DUB (dysfunctional uterine bleeding)   . GERD (gastroesophageal reflux disease)   . Graves disease   . Hypothyroidism    Allergies  Allergen Reactions  . Milk-Related Compounds     Intolerance to milk products-causes gas    Social History   Socioeconomic History  . Marital status: Married    Spouse name: Not on file  . Number of children: 0  . Years  of education: Not on file  . Highest education level: Not on file  Occupational History    Employer: [employer]  . Financial resource strain: Not on file  . Food insecurity:    Worry: Not on file    Inability: Not on file  . Transportation needs:    Medical: Not on file    Non-medical: Not on file  Tobacco Use  . Smoking status: Former Smoker    Packs/day: 1.00    Years: 20.00    Pack years: 20.00    Types: Cigarettes    Start date: 03/13/1967    Last attempt to quit:  07/14/1987    Years since quitting: 30.6  . Smokeless tobacco: Never Used  . Tobacco comment: quit 26 years ago  Substance and Sexual Activity  . Alcohol use: No    Alcohol/week: 0.0 standard drinks  . Drug use: No  . Sexual activity: Not on file  Lifestyle  . Physical activity:    Days per week: Not on file    Minutes per session: Not on file  . Stress: Not on file  Relationships  . Social connections:    Talks on phone: Not on file    Gets together: Not on file    Attends religious service: Not on file    Active member of club or organization: Not on file    Attends meetings of clubs or organizations: Not on file    Relationship status: Not on file  Other Topics Concern  . Not on file  Social History Narrative   Occupation: Freight forwarder   Regular exercise- yes   0 caffeine drinks     Vitals:   02/11/18 0959  BP: 130/86  Pulse: 80  Resp: 12  Temp: 98.7 F (37.1 C)  SpO2: 98%   Body mass index is 30.28 kg/m.   Physical Exam  Nursing note and vitals reviewed. Constitutional: She is oriented to person, place, and time. She appears well-developed. No distress.  HENT:  Head: Normocephalic and atraumatic.  Mouth/Throat: Uvula is midline and mucous membranes are normal. Posterior oropharyngeal erythema present. No oropharyngeal exudate or posterior oropharyngeal edema.  Eyes: Pupils are equal, round, and reactive to light. Conjunctivae are normal.  Left upper eyelid mild edema.  Neck: No tracheal tenderness present. No tracheal deviation present.  Cardiovascular: Normal rate and regular rhythm.  No murmur heard. Respiratory: Effort normal and breath sounds normal. No stridor. No respiratory distress.  Musculoskeletal:        General: No edema.  Lymphadenopathy:    She has cervical adenopathy.       Right cervical: Posterior cervical adenopathy present.       Left cervical: Posterior cervical adenopathy present.  Neurological: She is alert and oriented to  person, place, and time. She has normal strength. No cranial nerve deficit. Gait normal.  Skin: Skin is warm. Rash noted. Rash is urticarial.  Rash is affecting face,abdomen, and extremities.   Psychiatric: She has a normal mood and affect.  Well groomed, good eye contact.      ASSESSMENT AND PLAN:  Ms. Rosselyn was seen today for urticaria.  Diagnoses and all orders for this visit:  Urticaria We discussed possible etiologies. First episode, so I do not think further work-up is needed at this time. Here in the office after verbal consent she received Depo-Medrol 40 mg IM x 1. She will continue Prednisone taper. OTC Pepcid 20 mg bid and Zyrtec 10 mg bid for  7-10 days. Clearly instructed about warning signs.  -     predniSONE (DELTASONE) 20 MG tablet; 3 tabs for 3 days, 2 tabs for 3 days, 1 tabs for 3 days, and 1/2 tab for 3 days. Take tables together with breakfast. -     methylPREDNISolone acetate (DEPO-MEDROL) injection 40 mg  Acute pharyngitis, unspecified etiology ? Viral, allergic. Rapid strep here in the office negative,we will follow Cx. Gargles with saline and/or OTC throat lozenges may help.  -     POC Rapid Strep A -     Culture, Group A Strep     Return if symptoms worsen or fail to improve.     -Ms.Sanjuan Dame Stagliano was advised to seek immediate medical attention if sudden worsening symptoms.      [name]. [name], MD  [facility]. [place] office.

## 2018-02-13 LAB — CULTURE, GROUP A STREP
MICRO NUMBER:: 28179
SPECIMEN QUALITY:: ADEQUATE

## 2018-04-06 ENCOUNTER — Telehealth: Payer: Self-pay | Admitting: Family

## 2018-04-06 NOTE — Telephone Encounter (Signed)
Patient called to check on appt date/times/ she stated that she needed Friday appts

## 2018-04-21 ENCOUNTER — Other Ambulatory Visit: Payer: Self-pay | Admitting: Family Medicine

## 2018-04-21 DIAGNOSIS — Z1231 Encounter for screening mammogram for malignant neoplasm of breast: Secondary | ICD-10-CM

## 2018-05-08 ENCOUNTER — Encounter: Payer: Self-pay | Admitting: Family

## 2018-05-08 ENCOUNTER — Inpatient Hospital Stay: Payer: Managed Care, Other (non HMO) | Attending: Hematology & Oncology

## 2018-05-08 ENCOUNTER — Inpatient Hospital Stay: Payer: Managed Care, Other (non HMO)

## 2018-05-08 ENCOUNTER — Inpatient Hospital Stay (HOSPITAL_BASED_OUTPATIENT_CLINIC_OR_DEPARTMENT_OTHER): Payer: Managed Care, Other (non HMO) | Admitting: Family

## 2018-05-08 ENCOUNTER — Other Ambulatory Visit: Payer: Self-pay

## 2018-05-08 VITALS — BP 139/78 | HR 64 | Temp 98.3°F | Resp 19 | Ht 70.0 in | Wt 207.0 lb

## 2018-05-08 DIAGNOSIS — N189 Chronic kidney disease, unspecified: Secondary | ICD-10-CM | POA: Diagnosis not present

## 2018-05-08 DIAGNOSIS — D631 Anemia in chronic kidney disease: Secondary | ICD-10-CM | POA: Diagnosis not present

## 2018-05-08 DIAGNOSIS — D509 Iron deficiency anemia, unspecified: Secondary | ICD-10-CM

## 2018-05-08 DIAGNOSIS — R51 Headache: Secondary | ICD-10-CM | POA: Diagnosis not present

## 2018-05-08 DIAGNOSIS — D500 Iron deficiency anemia secondary to blood loss (chronic): Secondary | ICD-10-CM

## 2018-05-08 DIAGNOSIS — Z79899 Other long term (current) drug therapy: Secondary | ICD-10-CM

## 2018-05-08 LAB — CMP (CANCER CENTER ONLY)
ALT: 9 U/L (ref 0–44)
AST: 18 U/L (ref 15–41)
Albumin: 4.1 g/dL (ref 3.5–5.0)
Alkaline Phosphatase: 71 U/L (ref 38–126)
Anion gap: 7 (ref 5–15)
BUN: 14 mg/dL (ref 6–20)
CO2: 28 mmol/L (ref 22–32)
Calcium: 9.3 mg/dL (ref 8.9–10.3)
Chloride: 105 mmol/L (ref 98–111)
Creatinine: 1.25 mg/dL — ABNORMAL HIGH (ref 0.44–1.00)
GFR, Est AFR Am: 55 mL/min — ABNORMAL LOW (ref >60)
GFR, Estimated: 47 mL/min — ABNORMAL LOW (ref >60)
Glucose, Bld: 87 mg/dL (ref 70–99)
Potassium: 3.9 mmol/L (ref 3.5–5.1)
Sodium: 140 mmol/L (ref 135–145)
Total Bilirubin: 0.5 mg/dL (ref 0.3–1.2)
Total Protein: 7.1 g/dL (ref 6.5–8.1)

## 2018-05-08 LAB — CBC WITH DIFFERENTIAL (CANCER CENTER ONLY)
Abs Immature Granulocytes: 0 10*3/uL (ref 0.00–0.07)
Basophils Absolute: 0 10*3/uL (ref 0.0–0.1)
Basophils Relative: 0 %
Eosinophils Absolute: 0 10*3/uL (ref 0.0–0.5)
Eosinophils Relative: 1 %
HCT: 29.4 % — ABNORMAL LOW (ref 36.0–46.0)
Hemoglobin: 9.1 g/dL — ABNORMAL LOW (ref 12.0–15.0)
Immature Granulocytes: 0 %
Lymphocytes Relative: 40 %
Lymphs Abs: 1.5 10*3/uL (ref 0.7–4.0)
MCH: 29.3 pg (ref 26.0–34.0)
MCHC: 31 g/dL (ref 30.0–36.0)
MCV: 94.5 fL (ref 80.0–100.0)
Monocytes Absolute: 0.4 10*3/uL (ref 0.1–1.0)
Monocytes Relative: 12 %
Neutro Abs: 1.8 10*3/uL (ref 1.7–7.7)
Neutrophils Relative %: 47 %
Platelet Count: 216 10*3/uL (ref 150–400)
RBC: 3.11 MIL/uL — ABNORMAL LOW (ref 3.87–5.11)
RDW: 13.2 % (ref 11.5–15.5)
WBC Count: 3.7 10*3/uL — ABNORMAL LOW (ref 4.0–10.5)
nRBC: 0 % (ref 0.0–0.2)

## 2018-05-08 LAB — RETICULOCYTES
Immature Retic Fract: 10.5 % (ref 2.3–15.9)
RBC.: 3.13 MIL/uL — ABNORMAL LOW (ref 3.87–5.11)
Retic Count, Absolute: 41.3 10*3/uL (ref 19.0–186.0)
Retic Ct Pct: 1.3 % (ref 0.4–3.1)

## 2018-05-08 MED ORDER — DARBEPOETIN ALFA 300 MCG/0.6ML IJ SOSY
PREFILLED_SYRINGE | INTRAMUSCULAR | Status: AC
  Filled 2018-05-08: qty 0.6

## 2018-05-08 MED ORDER — DARBEPOETIN ALFA 300 MCG/0.6ML IJ SOSY
300.0000 ug | PREFILLED_SYRINGE | Freq: Once | INTRAMUSCULAR | Status: AC
  Administered 2018-05-08: 300 ug via SUBCUTANEOUS

## 2018-05-08 NOTE — Progress Notes (Signed)
Hematology and Oncology Follow Up Visit  Karen [name] [id number] 05/18/1960 58 y.o. 05/08/2018   Principle Diagnosis:  Anemia of renal insufficiency Intermittent iron deficiency anemia  Current Therapy:   Aranesp 300 mcg subcutaneous for hemoglobin less than 10 IV iron as indicated   Interim History:  Karen [name] is here today for follow-up. She states that she is having intermittent headaches and that she can tell her counts are low. Hgb today is 9.1, MCV 94.  No episodes of bleeding. No bruising or petechiae.  She states that she thinks she has met her deductible but still wants her injection even if she has not and has the potentially high co pay.  No fever, chills, n/v, cough, rash, dizziness, SOB, chest pain, palpitations, abdominal pain or changes in bowel or bladder habits.  No swelling, tenderness, numbness or tingling in her extremities.  No lymphadenopathy noted on exam.  She has maintained a good appetite and is staying well hydrated. Her weight is stable.   ECOG Performance Status: 1 - Symptomatic but completely ambulatory  Medications:  Allergies as of 05/08/2018      Reactions   Milk-related Compounds    Intolerance to milk products-causes gas      Medication List       Accurate as of May 08, 2018  2:53 PM. Always use your most recent med list.        buPROPion 150 MG 24 hr tablet Commonly known as:  WELLBUTRIN XL Take 150 mg by mouth daily.   escitalopram 20 MG tablet Commonly known as:  LEXAPRO Take 20 mg by mouth daily.   FOLIC ACID PO Take by mouth daily.   levothyroxine 125 MCG tablet Commonly known as:  SYNTHROID, LEVOTHROID TAKE 1 TABLET (125 MCG TOTAL) BY MOUTH DAILY.   omeprazole 20 MG capsule Commonly known as:  PRILOSEC TAKE 1 CAPSULE BY MOUTH EVERY DAY   OVER THE COUNTER MEDICATION   risperiDONE 1 MG tablet Commonly known as:  RISPERDAL Take 1 mg by mouth at bedtime.   Vitamin D (Ergocalciferol) 1.25 MG (50000 UT) Caps  capsule Commonly known as:  DRISDOL TAKE 1 CAPSULE (50,000 UNITS TOTAL) BY MOUTH EVERY 7 (SEVEN) DAYS. FOR 8 WEEKS. THEN EVERY 2 WEEKS.       Allergies:  Allergies  Allergen Reactions  . Milk-Related Compounds     Intolerance to milk products-causes gas    Past Medical History, Surgical history, Social history, and Family History were reviewed and updated.  Review of Systems: All other 10 point review of systems is negative.   Physical Exam:  vitals were not taken for this visit.   Wt Readings from Last 3 Encounters:  02/11/18 211 lb (95.7 kg)  01/16/18 205 lb 8 oz (93.2 kg)  11/24/17 196 lb 3.2 oz (89 kg)    Ocular: Sclerae unicteric, pupils equal, round and reactive to light Ear-nose-throat: Oropharynx clear, dentition fair Lymphatic: No cervical or supraclavicular adenopathy Lungs no rales or rhonchi, good excursion bilaterally Heart regular rate and rhythm, no murmur appreciated Abd soft, nontender, positive bowel sounds, no liver or spleen tip palpated on exam, no fluid wave  MSK no focal spinal tenderness, no joint edema Neuro: non-focal, well-oriented, appropriate affect Breasts: Deferred   Lab Results  Component Value Date   WBC 2.9 (L) 01/16/2018   HGB 9.4 (L) 01/16/2018   HCT 31.3 (L) 01/16/2018   MCV 95.1 01/16/2018   PLT 206 01/16/2018   Lab Results  Component Value Date     FERRITIN 266 01/16/2018   IRON 79 01/16/2018   TIBC 243 01/16/2018   UIBC 164 01/16/2018   IRONPCTSAT 32 01/16/2018   Lab Results  Component Value Date   RETICCTPCT 1.4 01/16/2018   RBC 3.29 (L) 01/16/2018   RBC 3.29 (L) 01/16/2018   RETICCTABS 26.4 11/18/2014   No results found for: KPAFRELGTCHN, LAMBDASER, KAPLAMBRATIO No results found for: IGGSERUM, IGA, IGMSERUM No results found for: TOTALPROTELP, ALBUMINELP, A1GS, A2GS, BETS, BETA2SER, GAMS, MSPIKE, SPEI   Chemistry      Component Value Date/Time   NA 134 (L) 01/16/2018 1509   NA 141 11/01/2016 1504   K 3.6  01/16/2018 1509   K 3.4 11/01/2016 1504   CL 103 01/16/2018 1509   CL 109 (H) 11/01/2016 1504   CO2 25 01/16/2018 1509   CO2 29 11/01/2016 1504   BUN 11 01/16/2018 1509   BUN 8 11/01/2016 1504   CREATININE 1.02 (H) 01/16/2018 1509   CREATININE 1.4 (H) 11/01/2016 1504      Component Value Date/Time   CALCIUM 8.9 01/16/2018 1509   CALCIUM 8.8 11/01/2016 1504   ALKPHOS 65 01/16/2018 1509   ALKPHOS 89 (H) 11/01/2016 1504   AST 12 (L) 01/16/2018 1509   ALT 10 01/16/2018 1509   ALT 16 11/01/2016 1504   BILITOT 0.5 01/16/2018 1509       Impression and Plan: Karen [name] is a very pleasant 58 [name] with iron deficiency anemia and anemia secondary to chronic renal insufficiency with low erythropoietin level.   We will proceed with Aranesp injection today for Hgb 9.1.  We will plan to see her back in another 2 months.  She will contact our office with any questions or concerns. We can certainly see her sooner if need be.     , NP 4/3/20202:53 PM  

## 2018-05-08 NOTE — Patient Instructions (Signed)

## 2018-05-10 ENCOUNTER — Other Ambulatory Visit: Payer: Self-pay | Admitting: Family Medicine

## 2018-05-10 LAB — ERYTHROPOIETIN: Erythropoietin: 10.6 m[IU]/mL (ref 2.6–18.5)

## 2018-05-11 LAB — IRON AND TIBC
Iron: 64 ug/dL (ref 41–142)
Saturation Ratios: 27 % (ref 21–57)
TIBC: 234 ug/dL — ABNORMAL LOW (ref 236–444)
UIBC: 170 ug/dL (ref 120–384)

## 2018-05-11 LAB — FERRITIN: Ferritin: 330 ng/mL — ABNORMAL HIGH (ref 11–307)

## 2018-05-13 ENCOUNTER — Ambulatory Visit: Payer: Managed Care, Other (non HMO)

## 2018-05-15 ENCOUNTER — Ambulatory Visit: Payer: Managed Care, Other (non HMO) | Admitting: Family

## 2018-05-15 ENCOUNTER — Other Ambulatory Visit: Payer: Managed Care, Other (non HMO)

## 2018-06-24 ENCOUNTER — Ambulatory Visit: Payer: Managed Care, Other (non HMO)

## 2018-07-06 ENCOUNTER — Inpatient Hospital Stay: Payer: Managed Care, Other (non HMO)

## 2018-07-06 ENCOUNTER — Inpatient Hospital Stay: Payer: Managed Care, Other (non HMO) | Attending: Hematology & Oncology | Admitting: Hematology & Oncology

## 2018-07-06 ENCOUNTER — Other Ambulatory Visit: Payer: Self-pay

## 2018-07-06 VITALS — BP 108/65 | HR 64 | Temp 98.0°F | Resp 16 | Wt 211.0 lb

## 2018-07-06 DIAGNOSIS — D509 Iron deficiency anemia, unspecified: Secondary | ICD-10-CM

## 2018-07-06 DIAGNOSIS — D500 Iron deficiency anemia secondary to blood loss (chronic): Secondary | ICD-10-CM

## 2018-07-06 DIAGNOSIS — D631 Anemia in chronic kidney disease: Secondary | ICD-10-CM

## 2018-07-06 DIAGNOSIS — N189 Chronic kidney disease, unspecified: Secondary | ICD-10-CM

## 2018-07-06 LAB — CBC WITH DIFFERENTIAL (CANCER CENTER ONLY)
Abs Immature Granulocytes: 0.01 10*3/uL (ref 0.00–0.07)
Basophils Absolute: 0 10*3/uL (ref 0.0–0.1)
Basophils Relative: 0 %
Eosinophils Absolute: 0.1 10*3/uL (ref 0.0–0.5)
Eosinophils Relative: 2 %
HCT: 34.4 % — ABNORMAL LOW (ref 36.0–46.0)
Hemoglobin: 10.3 g/dL — ABNORMAL LOW (ref 12.0–15.0)
Immature Granulocytes: 0 %
Lymphocytes Relative: 37 %
Lymphs Abs: 1.4 10*3/uL (ref 0.7–4.0)
MCH: 28.4 pg (ref 26.0–34.0)
MCHC: 29.9 g/dL — ABNORMAL LOW (ref 30.0–36.0)
MCV: 94.8 fL (ref 80.0–100.0)
Monocytes Absolute: 0.4 10*3/uL (ref 0.1–1.0)
Monocytes Relative: 10 %
Neutro Abs: 1.9 10*3/uL (ref 1.7–7.7)
Neutrophils Relative %: 51 %
Platelet Count: 229 10*3/uL (ref 150–400)
RBC: 3.63 MIL/uL — ABNORMAL LOW (ref 3.87–5.11)
RDW: 12.5 % (ref 11.5–15.5)
WBC Count: 3.7 10*3/uL — ABNORMAL LOW (ref 4.0–10.5)
nRBC: 0 % (ref 0.0–0.2)

## 2018-07-06 LAB — RETICULOCYTES
Immature Retic Fract: 10 % (ref 2.3–15.9)
RBC.: 3.62 MIL/uL — ABNORMAL LOW (ref 3.87–5.11)
Retic Count, Absolute: 37.6 10*3/uL (ref 19.0–186.0)
Retic Ct Pct: 1 % (ref 0.4–3.1)

## 2018-07-06 LAB — CMP (CANCER CENTER ONLY)
ALT: 13 U/L (ref 0–44)
AST: 18 U/L (ref 15–41)
Albumin: 4 g/dL (ref 3.5–5.0)
Alkaline Phosphatase: 72 U/L (ref 38–126)
Anion gap: 10 (ref 5–15)
BUN: 11 mg/dL (ref 6–20)
CO2: 25 mmol/L (ref 22–32)
Calcium: 8.8 mg/dL — ABNORMAL LOW (ref 8.9–10.3)
Chloride: 106 mmol/L (ref 98–111)
Creatinine: 1.32 mg/dL — ABNORMAL HIGH (ref 0.44–1.00)
GFR, Est AFR Am: 51 mL/min — ABNORMAL LOW (ref >60)
GFR, Estimated: 44 mL/min — ABNORMAL LOW (ref >60)
Glucose, Bld: 100 mg/dL — ABNORMAL HIGH (ref 70–99)
Potassium: 3.6 mmol/L (ref 3.5–5.1)
Sodium: 141 mmol/L (ref 135–145)
Total Bilirubin: 0.5 mg/dL (ref 0.3–1.2)
Total Protein: 7.4 g/dL (ref 6.5–8.1)

## 2018-07-06 LAB — IRON AND TIBC
Iron: 75 ug/dL (ref 41–142)
Saturation Ratios: 31 % (ref 21–57)
TIBC: 244 ug/dL (ref 236–444)
UIBC: 169 ug/dL (ref 120–384)

## 2018-07-06 LAB — FERRITIN: Ferritin: 300 ng/mL (ref 11–307)

## 2018-07-06 NOTE — Progress Notes (Signed)
Hematology and Oncology Follow Up Visit  Karen [name] [id number] [birth year] 57 y.o. 07/06/2018   Principle Diagnosis:  Anemia of renal insufficiency Intermittent iron deficiency anemia  Current Therapy:   Aranesp 300 mcg subcutaneous for hemoglobin less than 10 IV iron as indicated   Interim History:  Karen [name] is here today for follow-up.  She is doing okay.  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  She really is doing a good job doing this.  She is had no problems with the coronavirus.  She is watching her self and being very active.  Unfortunately, she had a cruise that was canceled and she was posted on Mother's Day weekend.  Hopefully, she will be able to go next year.  We last saw her, her ferritin was 330 with an iron saturation of 27%.  There is been no nausea or vomiting.  There is been no cough.  He has had no obvious bleeding.  He has had no rashes.  There is been no leg swelling.  Overall, her performance status is ECOG 0.   Medications:  Allergies as of 07/06/2018      Reactions   Milk-related Compounds    Intolerance to milk products-causes gas      Medication List       Accurate as of July 06, 2018  8:47 AM. If you have any questions, ask your nurse or doctor.        buPROPion 150 MG 24 hr tablet Commonly known as:  WELLBUTRIN XL Take 150 mg by mouth daily.   escitalopram 20 MG tablet Commonly known as:  LEXAPRO Take 20 mg by mouth daily.   FOLIC ACID PO Take by mouth daily.   levothyroxine 125 MCG tablet Commonly known as:  SYNTHROID TAKE 1 TABLET (125 MCG TOTAL) BY MOUTH DAILY.   omeprazole 20 MG capsule Commonly known as:  PRILOSEC TAKE 1 CAPSULE BY MOUTH EVERY DAY   OVER THE COUNTER MEDICATION   risperiDONE 1 MG tablet Commonly known as:  RISPERDAL Take 1 mg by mouth at bedtime.   Vitamin D (Ergocalciferol) 1.25 MG (50000 UT) Caps capsule Commonly known as:  DRISDOL TAKE 1 CAPSULE (50,000 UNITS TOTAL) BY  MOUTH EVERY 7 (SEVEN) DAYS. FOR 8 WEEKS. THEN EVERY 2 WEEKS.       Allergies:  Allergies  Allergen Reactions  . Milk-Related Compounds     Intolerance to milk products-causes gas    Past Medical History, Surgical history, Social history, and Family History were reviewed and updated.  Review of Systems: Review of Systems  Constitutional: Negative.   HENT: Negative.   Eyes: Negative.   Respiratory: Negative.   Cardiovascular: Negative.   Gastrointestinal: Negative.   Genitourinary: Negative.   Musculoskeletal: Negative.   Skin: Negative.   Neurological: Negative.   Endo/Heme/Allergies: Negative.   Psychiatric/Behavioral: Negative.      Physical Exam:  weight is 211 lb (95.7 kg). Her oral temperature is 98 F (36.7 C). Her blood pressure is 108/65 and her pulse is 64. Her respiration is 16 and oxygen saturation is 100%.   Wt Readings from Last 3 Encounters:  07/06/18 211 lb (95.7 kg)  05/08/18 207 lb (93.9 kg)  02/11/18 211 lb (95.7 kg)    Physical Exam Vitals signs reviewed.  HENT:     Head: Normocephalic and atraumatic.  Eyes:     Pupils: Pupils are equal, round, and reactive to light.  Neck:     Musculoskeletal: Normal range of  motion.  Cardiovascular:     Rate and Rhythm: Normal rate and regular rhythm.     Heart sounds: Normal heart sounds.  Pulmonary:     Effort: Pulmonary effort is normal.     Breath sounds: Normal breath sounds.  Abdominal:     General: Bowel sounds are normal.     Palpations: Abdomen is soft.  Musculoskeletal: Normal range of motion.        General: No tenderness or deformity.  Lymphadenopathy:     Cervical: No cervical adenopathy.  Skin:    General: Skin is warm and dry.     Findings: No erythema or rash.  Neurological:     Mental Status: She is alert and oriented to person, place, and time.  Psychiatric:        Behavior: Behavior normal.        Thought Content: Thought content normal.        Judgment: Judgment normal.       Lab Results  Component Value Date   WBC 3.7 (L) 07/06/2018   HGB 10.3 (L) 07/06/2018   HCT 34.4 (L) 07/06/2018   MCV 94.8 07/06/2018   PLT 229 07/06/2018   Lab Results  Component Value Date   FERRITIN 330 (H) 05/08/2018   IRON 64 05/08/2018   TIBC 234 (L) 05/08/2018   UIBC 170 05/08/2018   IRONPCTSAT 27 05/08/2018   Lab Results  Component Value Date   RETICCTPCT 1.0 07/06/2018   RBC 3.63 (L) 07/06/2018   RBC 3.62 (L) 07/06/2018   RETICCTABS 26.4 11/18/2014   No results found for: KPAFRELGTCHN, LAMBDASER, KAPLAMBRATIO No results found for: IGGSERUM, IGA, IGMSERUM No results found for: Odetta Pink, SPEI   Chemistry      Component Value Date/Time   NA 140 05/08/2018 1442   NA 141 11/01/2016 1504   K 3.9 05/08/2018 1442   K 3.4 11/01/2016 1504   CL 105 05/08/2018 1442   CL 109 (H) 11/01/2016 1504   CO2 28 05/08/2018 1442   CO2 29 11/01/2016 1504   BUN 14 05/08/2018 1442   BUN 8 11/01/2016 1504   CREATININE 1.25 (H) 05/08/2018 1442   CREATININE 1.4 (H) 11/01/2016 1504      Component Value Date/Time   CALCIUM 9.3 05/08/2018 1442   CALCIUM 8.8 11/01/2016 1504   ALKPHOS 71 05/08/2018 1442   ALKPHOS 89 (H) 11/01/2016 1504   AST 18 05/08/2018 1442   ALT 9 05/08/2018 1442   ALT 16 11/01/2016 1504   BILITOT 0.5 05/08/2018 1442       Impression and Plan: Karen [name] is a very pleasant 70 [name] with iron deficiency anemia and anemia secondary to chronic renal insufficiency with low erythropoietin level.   We really do not have to give her any Aranesp today.  I like to see her back in 6 weeks.  I want to make sure that we do not get the blood count down to low that would cause her symptoms.     Volanda Napoleon, MD 6/1/20208:47 AM

## 2018-07-07 LAB — ERYTHROPOIETIN: Erythropoietin: 8.4 m[IU]/mL (ref 2.6–18.5)

## 2018-08-04 ENCOUNTER — Other Ambulatory Visit: Payer: Self-pay

## 2018-08-04 ENCOUNTER — Ambulatory Visit
Admission: RE | Admit: 2018-08-04 | Discharge: 2018-08-04 | Disposition: A | Discharge status: Home / Self Care | Payer: Managed Care, Other (non HMO) | Source: Ambulatory Visit | Attending: Family Medicine | Admitting: Family Medicine

## 2018-08-04 DIAGNOSIS — Z1231 Encounter for screening mammogram for malignant neoplasm of breast: Secondary | ICD-10-CM

## 2018-08-10 ENCOUNTER — Ambulatory Visit: Payer: Self-pay | Admitting: *Deleted

## 2018-08-10 NOTE — Telephone Encounter (Signed)
  Pt called wanting to know how to get tested for covid-19. She has not been in contact with anyone that has tested positive. She does not have symptoms. Advised to go to a pop up or mobile testing site if she wants to be tested without having symptoms or come in contatct with a positive person. She voiced understanding. rouing to [facility] at [facility] for review. a9Reason for Disposition . [1] Caller requesting NON-URGENT health information AND [2] PCP's office is the best resource  Answer Assessment - Initial Assessment Questions 1. REASON FOR CALL or QUESTION: "What is your reason for calling today?" or "How can I best help you?" or "What question do you have that I can help answer?"     Pt wanted to know if she can get tested for covid-19.  Protocols used: INFORMATION ONLY CALL-A-AH

## 2018-08-12 ENCOUNTER — Other Ambulatory Visit: Payer: Self-pay | Admitting: *Deleted

## 2018-08-12 DIAGNOSIS — Z20822 Contact with and (suspected) exposure to covid-19: Secondary | ICD-10-CM

## 2018-08-17 ENCOUNTER — Other Ambulatory Visit: Payer: Self-pay

## 2018-08-17 ENCOUNTER — Inpatient Hospital Stay (HOSPITAL_BASED_OUTPATIENT_CLINIC_OR_DEPARTMENT_OTHER): Payer: Managed Care, Other (non HMO) | Admitting: Family

## 2018-08-17 ENCOUNTER — Inpatient Hospital Stay: Payer: Managed Care, Other (non HMO)

## 2018-08-17 ENCOUNTER — Encounter: Payer: Self-pay | Admitting: Family

## 2018-08-17 ENCOUNTER — Telehealth: Payer: Self-pay | Admitting: *Deleted

## 2018-08-17 ENCOUNTER — Inpatient Hospital Stay: Payer: Managed Care, Other (non HMO) | Attending: Hematology & Oncology

## 2018-08-17 VITALS — BP 160/79 | HR 64 | Temp 97.8°F | Resp 18 | Ht 70.0 in | Wt 216.1 lb

## 2018-08-17 DIAGNOSIS — R51 Headache: Secondary | ICD-10-CM

## 2018-08-17 DIAGNOSIS — D509 Iron deficiency anemia, unspecified: Secondary | ICD-10-CM | POA: Diagnosis not present

## 2018-08-17 DIAGNOSIS — Z79899 Other long term (current) drug therapy: Secondary | ICD-10-CM

## 2018-08-17 DIAGNOSIS — D631 Anemia in chronic kidney disease: Secondary | ICD-10-CM

## 2018-08-17 DIAGNOSIS — N189 Chronic kidney disease, unspecified: Secondary | ICD-10-CM | POA: Insufficient documentation

## 2018-08-17 DIAGNOSIS — D500 Iron deficiency anemia secondary to blood loss (chronic): Secondary | ICD-10-CM

## 2018-08-17 DIAGNOSIS — N183 Chronic kidney disease, stage 3 unspecified: Secondary | ICD-10-CM

## 2018-08-17 LAB — CBC WITH DIFFERENTIAL (CANCER CENTER ONLY)
Abs Immature Granulocytes: 0.01 10*3/uL (ref 0.00–0.07)
Basophils Absolute: 0 10*3/uL (ref 0.0–0.1)
Basophils Relative: 0 %
Eosinophils Absolute: 0.1 10*3/uL (ref 0.0–0.5)
Eosinophils Relative: 1 %
HCT: 30.4 % — ABNORMAL LOW (ref 36.0–46.0)
Hemoglobin: 9.4 g/dL — ABNORMAL LOW (ref 12.0–15.0)
Immature Granulocytes: 0 %
Lymphocytes Relative: 26 %
Lymphs Abs: 0.9 10*3/uL (ref 0.7–4.0)
MCH: 29.3 pg (ref 26.0–34.0)
MCHC: 30.9 g/dL (ref 30.0–36.0)
MCV: 94.7 fL (ref 80.0–100.0)
Monocytes Absolute: 0.4 10*3/uL (ref 0.1–1.0)
Monocytes Relative: 11 %
Neutro Abs: 2.2 10*3/uL (ref 1.7–7.7)
Neutrophils Relative %: 62 %
Platelet Count: 228 10*3/uL (ref 150–400)
RBC: 3.21 MIL/uL — ABNORMAL LOW (ref 3.87–5.11)
RDW: 13.7 % (ref 11.5–15.5)
WBC Count: 3.6 10*3/uL — ABNORMAL LOW (ref 4.0–10.5)
nRBC: 0 % (ref 0.0–0.2)

## 2018-08-17 LAB — CMP (CANCER CENTER ONLY)
ALT: 9 U/L (ref 0–44)
AST: 17 U/L (ref 15–41)
Albumin: 4 g/dL (ref 3.5–5.0)
Alkaline Phosphatase: 78 U/L (ref 38–126)
Anion gap: 7 (ref 5–15)
BUN: 12 mg/dL (ref 6–20)
CO2: 27 mmol/L (ref 22–32)
Calcium: 8.4 mg/dL — ABNORMAL LOW (ref 8.9–10.3)
Chloride: 107 mmol/L (ref 98–111)
Creatinine: 0.97 mg/dL (ref 0.44–1.00)
GFR, Est AFR Am: >60 mL/min (ref >60)
GFR, Estimated: >60 mL/min (ref >60)
Glucose, Bld: 96 mg/dL (ref 70–99)
Potassium: 4 mmol/L (ref 3.5–5.1)
Sodium: 141 mmol/L (ref 135–145)
Total Bilirubin: 0.4 mg/dL (ref 0.3–1.2)
Total Protein: 6.8 g/dL (ref 6.5–8.1)

## 2018-08-17 LAB — NOVEL CORONAVIRUS, NAA: SARS-CoV-2, NAA: NOT DETECTED

## 2018-08-17 LAB — IRON AND TIBC
Iron: 56 ug/dL (ref 41–142)
Saturation Ratios: 23 % (ref 21–57)
TIBC: 239 ug/dL (ref 236–444)
UIBC: 183 ug/dL (ref 120–384)

## 2018-08-17 LAB — FERRITIN: Ferritin: 283 ng/mL (ref 11–307)

## 2018-08-17 MED ORDER — DARBEPOETIN ALFA 300 MCG/0.6ML IJ SOSY
PREFILLED_SYRINGE | INTRAMUSCULAR | Status: AC
  Filled 2018-08-17: qty 0.6

## 2018-08-17 MED ORDER — SODIUM CHLORIDE 0.9 % IV SOLN
Freq: Once | INTRAVENOUS | Status: DC
  Filled 2018-08-17: qty 250

## 2018-08-17 MED ORDER — DARBEPOETIN ALFA 300 MCG/0.6ML IJ SOSY: 300.0000 ug | PREFILLED_SYRINGE | Freq: Once | INTRAMUSCULAR | Status: DC

## 2018-08-17 NOTE — Telephone Encounter (Signed)
Copied from [place] [id number]. Topic: Appointment Scheduling - Scheduling Inquiry for Clinic >> Aug 17, 2018 10:30 AM Reyne Dumas L wrote: Reason for CRM:   Pt calling to schedule an appointment for blood pressure check.  States she couldn't have an infusion at cancer center today because her top number was 160.  Tried calling office 3x. Pt can be reached at [phone]  Patient scheuled for Doxy tomorrow morning at 7:30 AM

## 2018-08-17 NOTE — Progress Notes (Signed)
Hematology and Oncology Follow Up Visit  Karen [name] [id number] [birth year] 58 y.o. 08/17/2018   Principle Diagnosis:  Anemia of renal insufficiency Intermittent iron deficiency anemia  Current Therapy:   Aranesp 300 mcg subcutaneous for hemoglobin less than 10 IV iron as indicated   Interim History:  Karen [name] is here today for follow-up. She states that she has a nagging headache. Karen [name] BP is elevated today as well. She states that she will recheck at Karen [name] mother's house and call Karen [name] PCP for follow-up if it persists.  She has had no fever, chills, n/v, cough, rash, dizziness, blurred/loss of vision, SOB, chest pain, palpitations, abdominal pain or changes in bowel or bladder habits.  No swelling, tenderness, numbness or tingling in Karen [name] extremities.  No lymphadenopathy noted on exam. She is eating well and staying hydrated. Karen [name] weight is stable.    ECOG Performance Status: 1 - Symptomatic but completely ambulatory  Medications:  Allergies as of 08/17/2018      Reactions   Milk-related Compounds Other (See Comments)   Intolerance to milk products-causes gas      Medication List       Accurate as of August 17, 2018  9:36 AM. If you have any questions, ask your nurse or doctor.        buPROPion 150 MG 24 hr tablet Commonly known as: WELLBUTRIN XL Take 150 mg by mouth daily.   escitalopram 20 MG tablet Commonly known as: LEXAPRO Take 20 mg by mouth daily.   FOLIC ACID PO Take by mouth daily.   levothyroxine 125 MCG tablet Commonly known as: SYNTHROID TAKE 1 TABLET (125 MCG TOTAL) BY MOUTH DAILY.   omeprazole 20 MG capsule Commonly known as: PRILOSEC TAKE 1 CAPSULE BY MOUTH EVERY DAY   OVER THE COUNTER MEDICATION   risperiDONE 1 MG tablet Commonly known as: RISPERDAL Take 1 mg by mouth at bedtime.   Vitamin D (Ergocalciferol) 1.25 MG (50000 UT) Caps capsule Commonly known as: DRISDOL TAKE 1 CAPSULE (50,000 UNITS TOTAL) BY MOUTH EVERY 7 (SEVEN) DAYS. FOR 8  WEEKS. THEN EVERY 2 WEEKS.       Allergies:  Allergies  Allergen Reactions  . Milk-Related Compounds Other (See Comments)    Intolerance to milk products-causes gas    Past Medical History, Surgical history, Social history, and Family History were reviewed and updated.  Review of Systems: All other 10 point review of systems is negative.   Physical Exam:  height is 5\' 10"  (1.778 m) and weight is 216 lb 1.3 oz (98 kg). Karen [name] oral temperature is 97.8 F (36.6 C). Karen [name] blood pressure is 160/79 (abnormal) and Karen [name] pulse is 64. Karen [name] respiration is 18 and oxygen saturation is 100%.   Wt Readings from Last 3 Encounters:  08/17/18 216 lb 1.3 oz (98 kg)  07/06/18 211 lb (95.7 kg)  05/08/18 207 lb (93.9 kg)    Ocular: Sclerae unicteric, pupils equal, round and reactive to light Ear-nose-throat: Oropharynx clear, dentition fair Lymphatic: No cervical or supraclavicular adenopathy Lungs no rales or rhonchi, good excursion bilaterally Heart regular rate and rhythm, no murmur appreciated Abd soft, nontender, positive bowel sounds, no liver or spleen tip palpated on exam, no fluid wave  MSK no focal spinal tenderness, no joint edema Neuro: non-focal, well-oriented, appropriate affect Breasts: Deferred   Lab Results  Component Value Date   WBC 3.6 (L) 08/17/2018   HGB 9.4 (L) 08/17/2018   HCT 30.4 (L) 08/17/2018   MCV 94.7 08/17/2018   PLT  228 08/17/2018   Lab Results  Component Value Date   FERRITIN 300 07/06/2018   IRON 75 07/06/2018   TIBC 244 07/06/2018   UIBC 169 07/06/2018   IRONPCTSAT 31 07/06/2018   Lab Results  Component Value Date   RETICCTPCT 1.0 07/06/2018   RBC 3.21 (L) 08/17/2018   RETICCTABS 26.4 11/18/2014   No results found for: KPAFRELGTCHN, LAMBDASER, KAPLAMBRATIO No results found for: IGGSERUM, IGA, IGMSERUM No results found for: Odetta Pink, SPEI   Chemistry      Component Value Date/Time   NA 141  08/17/2018 0836   NA 141 11/01/2016 1504   K 4.0 08/17/2018 0836   K 3.4 11/01/2016 1504   CL 107 08/17/2018 0836   CL 109 (H) 11/01/2016 1504   CO2 27 08/17/2018 0836   CO2 29 11/01/2016 1504   BUN 12 08/17/2018 0836   BUN 8 11/01/2016 1504   CREATININE 0.97 08/17/2018 0836   CREATININE 1.4 (H) 11/01/2016 1504      Component Value Date/Time   CALCIUM 8.4 (L) 08/17/2018 0836   CALCIUM 8.8 11/01/2016 1504   ALKPHOS 78 08/17/2018 0836   ALKPHOS 89 (H) 11/01/2016 1504   AST 17 08/17/2018 0836   ALT 9 08/17/2018 0836   ALT 16 11/01/2016 1504   BILITOT 0.4 08/17/2018 0836       Impression and Plan: Karen [name] is a very pleasant 62 [name] with iron deficiency anemia and anemia secondary to chronic renal insufficiency with low erythropoietin level.  We will hold off on Aranesp today due to Karen [name] persistent headache and elevated BP.  She is not in any distress at this time and plans to follow-up with Karen [name] PCP for these symptoms.  We will plan to see Karen [name] back in another 6 weeks.  She will contact our office with any questions or concerns. We can certainly see Karen [name] sooner if needed.   Laverna Peace, NP 7/13/20209:36 AM

## 2018-08-18 ENCOUNTER — Encounter: Payer: Self-pay | Admitting: Family Medicine

## 2018-08-18 ENCOUNTER — Ambulatory Visit (INDEPENDENT_AMBULATORY_CARE_PROVIDER_SITE_OTHER): Payer: Managed Care, Other (non HMO) | Admitting: Family Medicine

## 2018-08-18 DIAGNOSIS — R030 Elevated blood-pressure reading, without diagnosis of hypertension: Secondary | ICD-10-CM | POA: Diagnosis not present

## 2018-08-18 DIAGNOSIS — E890 Postprocedural hypothyroidism: Secondary | ICD-10-CM

## 2018-08-18 DIAGNOSIS — R519 Headache, unspecified: Secondary | ICD-10-CM | POA: Insufficient documentation

## 2018-08-18 DIAGNOSIS — R51 Headache: Secondary | ICD-10-CM | POA: Diagnosis not present

## 2018-08-18 DIAGNOSIS — D500 Iron deficiency anemia secondary to blood loss (chronic): Secondary | ICD-10-CM | POA: Diagnosis not present

## 2018-08-18 DIAGNOSIS — N183 Chronic kidney disease, stage 3 unspecified: Secondary | ICD-10-CM

## 2018-08-18 MED ORDER — NORTRIPTYLINE HCL 10 MG PO CAPS: 10.0000 mg | ORAL_CAPSULE | Freq: Every day | ORAL | 1 refills | Status: DC

## 2018-08-18 NOTE — Progress Notes (Signed)
Virtual Visit via Video Note   I connected with Karen [name] 08/23/18 at  7:30 AM EDT by a video enabled telemedicine application and verified that I am speaking with the correct person using two identifiers.  Location patient: home Location provider:work office Persons participating in the virtual visit: patient, provider  I discussed the limitations of evaluation and management by telemedicine and the availability of in person appointments. The patient expressed understanding and agreed to proceed.   HPI: Karen [name] is a 58 yo female with Hx of iron deficiency anemia, hypothyroidism, and CKD among some who is concerned about elevated BP yesterday. Oncology visit yesterday BP was 160/79. 07/06/18 BP 108/65. No prior history of hypertension. She just ordered a BP monitor. She denies visual changes, chest pain, dyspnea, palpitations, abdominal pain, or edema. CKD 3, she has not noted gross hematuria, decreased urine output, or foam in urine.  Having almost daily headache for a few years. She attributes headache to anemia, usually alleviated by iron infusion.  She states that because BP reading she could not receive treatment yesterday.  Lab Results  Component Value Date   WBC 3.6 (L) 08/17/2018   HGB 9.4 (L) 08/17/2018   HCT 30.4 (L) 08/17/2018   MCV 94.7 08/17/2018   PLT 228 08/17/2018    She denies associated visual changes, photophobia, nausea, vomiting, or focal weakness. She gets up in the morning with headache, dull, "nagging pain", 5-6/10. She has not identified exacerbating factors.  She denies chills, fever, fatigue, joint erythema or erythema, or a skin rash. She states that she has been eating more salt than usual.  Headache is bitemporal and has been stable for years. For the past 2 months she has been taking ibuprofen twice daily.  Lab Results  Component Value Date   CREATININE 0.97 08/17/2018   BUN 12 08/17/2018   NA 141 08/17/2018   K 4.0 08/17/2018   CL 107  08/17/2018   CO2 27 08/17/2018   Hypothyroidism, currently she is on levothyroxine 125 mcg daily. She has not noted tremor, abnormal weight loss, cold/heat intolerance, or changes in bowel habits.  Lab Results  Component Value Date   TSH 0.85 12/01/2017   Lab Results  Component Value Date   MICROALBUR <0.7 11/22/2016   Currently she is on Risperdal 1 mg daily at bedtime, Wellbutrin XL 150 mg daily, and Lexapro 20 mg daily. She takes this medications for depression and she follows with psychiatrist periodically.  ROS: See pertinent positives and negatives per HPI.  Past Medical History:  Diagnosis Date  . Anemia   . Barrett esophagus   . Depression   . DUB (dysfunctional uterine bleeding)   . GERD (gastroesophageal reflux disease)   . Graves disease   . Hypothyroidism     Past Surgical History:  Procedure Laterality Date  . CHOLECYSTECTOMY N/A 07/16/2012   Procedure: LAPAROSCOPIC CHOLECYSTECTOMY WITH INTRAOPERATIVE CHOLANGIOGRAM;  Surgeon: Joyice Faster. Cornett, MD;  Location: [facility];  Service: General;  Laterality: N/A;  . COLONOSCOPY    . FOOT SURGERY     Left  . UPPER GASTROINTESTINAL ENDOSCOPY      Family History  Problem Relation Age of Onset  . Hypertension Mother   . Asthma Mother   . Cancer Father 63       larynx cancer   . Breast cancer Maternal Aunt 60  . Colon cancer Neg Hx   . Colon polyps Neg Hx   . Esophageal cancer Neg Hx   . Rectal  cancer Neg Hx   . Stomach cancer Neg Hx     Social History   Socioeconomic History  . Marital status: Married    Spouse name: Not on file  . Number of children: 0  . Years of education: Not on file  . Highest education level: Not on file  Occupational History    Employer: [employer]  . Financial resource strain: Not on file  . Food insecurity    Worry: Not on file    Inability: Not on file  . Transportation needs    Medical: Not on file    Non-medical: Not on file  Tobacco Use  . Smoking status:  Former Smoker    Packs/day: 1.00    Years: 20.00    Pack years: 20.00    Types: Cigarettes    Start date: 03/13/1967    Quit date: 07/14/1987    Years since quitting: 31.1  . Smokeless tobacco: Never Used  . Tobacco comment: quit 26 years ago  Substance and Sexual Activity  . Alcohol use: No    Alcohol/week: 0.0 standard drinks  . Drug use: No  . Sexual activity: Not on file  Lifestyle  . Physical activity    Days per week: Not on file    Minutes per session: Not on file  . Stress: Not on file  Relationships  . Social Herbalist on phone: Not on file    Gets together: Not on file    Attends religious service: Not on file    Active member of club or organization: Not on file    Attends meetings of clubs or organizations: Not on file    Relationship status: Not on file  . Intimate partner violence    Fear of current or ex partner: Not on file    Emotionally abused: Not on file    Physically abused: Not on file    Forced sexual activity: Not on file  Other Topics Concern  . Not on file  Social History Narrative   Occupation: Freight forwarder   Regular exercise- yes   0 caffeine drinks     Current Outpatient Medications:  .  buPROPion (WELLBUTRIN XL) 150 MG 24 hr tablet, Take 150 mg by mouth daily., Disp: , Rfl:  .  escitalopram (LEXAPRO) 20 MG tablet, Take 20 mg by mouth daily.  , Disp: , Rfl:  .  FOLIC ACID PO, Take by mouth daily., Disp: , Rfl:  .  levothyroxine (SYNTHROID, LEVOTHROID) 125 MCG tablet, TAKE 1 TABLET (125 MCG TOTAL) BY MOUTH DAILY., Disp: 90 tablet, Rfl: 1 .  nortriptyline (PAMELOR) 10 MG capsule, Take 1 capsule (10 mg total) by mouth at bedtime., Disp: 30 capsule, Rfl: 1 .  omeprazole (PRILOSEC) 20 MG capsule, TAKE 1 CAPSULE BY MOUTH EVERY DAY, Disp: 30 capsule, Rfl: 5 .  OVER THE COUNTER MEDICATION, , Disp: , Rfl:  .  risperiDONE (RISPERDAL) 1 MG tablet, Take 1 mg by mouth at bedtime., Disp: , Rfl: 1 .  Vitamin D, Ergocalciferol, (DRISDOL)  50000 units CAPS capsule, TAKE 1 CAPSULE (50,000 UNITS TOTAL) BY MOUTH EVERY 7 (SEVEN) DAYS. FOR 8 WEEKS. THEN EVERY 2 WEEKS., Disp: 4 capsule, Rfl: 5 No current facility-administered medications for this visit.   Facility-Administered Medications Ordered in Other Visits:  .  0.9 %  sodium chloride infusion, , Intravenous, Once, [place], Holli Humbles, NP  EXAM:  VITALS per patient if applicable:N/A  GENERAL: alert, oriented, appears well and  in no acute distress  HEENT: atraumatic, conjunctiva clear, no obvious facial abnormalities on inspection.  NECK: normal movements of the head and neck  LUNGS: on inspection no signs of respiratory distress, breathing rate appears normal, no obvious gross SOB, gasping or wheezing  CV: no obvious cyanosis  Karen: moves all visible extremities without noticeable abnormality  PSYCH/NEURO: pleasant and cooperative, no obvious depression,+ Anxious.Speech and thought processing grossly intact No focal deficit appreciated.  ASSESSMENT AND PLAN:  Discussed the following assessment and plan:  Orders Placed This Encounter  Procedures  . TSH  . Microalbumin / creatinine urine ratio  . Sedimentation rate  . C-reactive protein    Elevated blood pressure reading Elevated BP x1. During prior visits BP has been in normal range. Recommend checking BP daily and to let me know about readings in 2 to 3 weeks, before if BP is persistently above 140/90. We discussed possible complications of elevated BP. Low-salt diet was recommended. We also discussed side effects of chronic NSAID use, strongly recommend avoiding them.  Chronic kidney disease Strongly recommend avoiding NSAIDs. Low-salt diet and adequate hydration. If BP is persistently elevated, will consider adding a ACE inhibitor or ARB. Instructed about warning signs.  Hypothyroidism No changes in current management, will follow labs done today and will give further recommendations  accordingly.  Iron deficiency anemia This problem can certainly contribute to her headache. H/H has been stable, mid 9-9.4. Continue following with hematologist.  Headache, unspecified headache type Problem seems to be chronic. I do not think head imaging is needed at this time. We discussed possible etiologies. Certainly anemia can be a contributing factor. ?  Tension headache. After discussion of some side effects, she agrees with trying nortriptyline 10 mg daily at bedtime. Recommend taking acetaminophen 500 mg 3-4 times per day as needed for headache. Instructed about warning signs.  Lab appointment will be arranged and recommendation will be given accordingly. Follow-up in 4 weeks.  Lab appt will be arranged.  I discussed the assessment and treatment plan with the patient.  She was provided an opportunity to ask questions and all were answered.  She agreed with the plan and demonstrated an understanding of the instructions.   The patient was advised to call back or seek an in-person evaluation if the symptoms worsen or if the condition fails to improve as anticipated.  Return in about 4 weeks (around 09/15/2018) for HTN,HA.    Betty [name], MD

## 2018-08-18 NOTE — Assessment & Plan Note (Signed)
This problem can certainly contribute to her headache. H/H has been stable, mid 9-9.4. Continue following with hematologist.

## 2018-08-18 NOTE — Assessment & Plan Note (Addendum)
Problem seems to be chronic. I do not think head imaging is needed at this time. We discussed possible etiologies. Certainly anemia can be a contributing factor. ?  Tension headache. After discussion of some side effects, she agrees with trying nortriptyline 10 mg daily at bedtime. Recommend taking acetaminophen 500 mg 3-4 times per day as needed for headache. Instructed about warning signs.  Lab appointment will be arranged and recommendation will be given accordingly. Follow-up in 4 weeks.

## 2018-08-18 NOTE — Assessment & Plan Note (Signed)
No changes in current management, will follow labs done today and will give further recommendations accordingly.  

## 2018-08-18 NOTE — Assessment & Plan Note (Signed)
Strongly recommend avoiding NSAIDs. Low-salt diet and adequate hydration. If BP is persistently elevated, will consider adding a ACE inhibitor or ARB. Instructed about warning signs.

## 2018-08-26 ENCOUNTER — Other Ambulatory Visit: Payer: Self-pay

## 2018-08-26 ENCOUNTER — Other Ambulatory Visit (INDEPENDENT_AMBULATORY_CARE_PROVIDER_SITE_OTHER): Payer: Managed Care, Other (non HMO)

## 2018-08-26 DIAGNOSIS — R51 Headache: Secondary | ICD-10-CM | POA: Diagnosis not present

## 2018-08-26 DIAGNOSIS — R030 Elevated blood-pressure reading, without diagnosis of hypertension: Secondary | ICD-10-CM | POA: Diagnosis not present

## 2018-08-26 DIAGNOSIS — N183 Chronic kidney disease, stage 3 unspecified: Secondary | ICD-10-CM

## 2018-08-26 DIAGNOSIS — R519 Headache, unspecified: Secondary | ICD-10-CM

## 2018-08-26 LAB — MICROALBUMIN / CREATININE URINE RATIO
Creatinine,U: 28.5 mg/dL
Microalb Creat Ratio: 2.5 mg/g (ref 0.0–30.0)
Microalb, Ur: <0.7 mg/dL (ref 0.0–1.9)

## 2018-08-26 LAB — C-REACTIVE PROTEIN: CRP: <1 mg/dL (ref 0.5–20.0)

## 2018-08-26 LAB — TSH: TSH: 1.13 u[IU]/mL (ref 0.35–4.50)

## 2018-08-26 LAB — SEDIMENTATION RATE: Sed Rate: 37 mm/hr — ABNORMAL HIGH (ref 0–30)

## 2018-09-02 ENCOUNTER — Encounter: Payer: Self-pay | Admitting: Family Medicine

## 2018-09-12 ENCOUNTER — Other Ambulatory Visit: Payer: Self-pay | Admitting: Family Medicine

## 2018-10-02 ENCOUNTER — Inpatient Hospital Stay: Payer: Managed Care, Other (non HMO)

## 2018-10-02 ENCOUNTER — Other Ambulatory Visit: Payer: Self-pay

## 2018-10-02 ENCOUNTER — Inpatient Hospital Stay: Payer: Managed Care, Other (non HMO) | Admitting: Hematology & Oncology

## 2018-10-02 ENCOUNTER — Inpatient Hospital Stay: Payer: Managed Care, Other (non HMO) | Attending: Hematology & Oncology

## 2018-10-02 ENCOUNTER — Encounter: Payer: Self-pay | Admitting: Family

## 2018-10-02 ENCOUNTER — Inpatient Hospital Stay (HOSPITAL_BASED_OUTPATIENT_CLINIC_OR_DEPARTMENT_OTHER): Payer: Managed Care, Other (non HMO) | Admitting: Family

## 2018-10-02 VITALS — BP 135/67 | HR 78 | Temp 97.6°F | Resp 18 | Ht 70.0 in | Wt 219.8 lb

## 2018-10-02 DIAGNOSIS — D500 Iron deficiency anemia secondary to blood loss (chronic): Secondary | ICD-10-CM

## 2018-10-02 DIAGNOSIS — N183 Chronic kidney disease, stage 3 unspecified: Secondary | ICD-10-CM

## 2018-10-02 DIAGNOSIS — N189 Chronic kidney disease, unspecified: Secondary | ICD-10-CM | POA: Insufficient documentation

## 2018-10-02 DIAGNOSIS — D631 Anemia in chronic kidney disease: Secondary | ICD-10-CM

## 2018-10-02 LAB — CMP (CANCER CENTER ONLY)
ALT: 17 U/L (ref 0–44)
AST: 23 U/L (ref 15–41)
Albumin: 4.2 g/dL (ref 3.5–5.0)
Alkaline Phosphatase: 89 U/L (ref 38–126)
Anion gap: 9 (ref 5–15)
BUN: 14 mg/dL (ref 6–20)
CO2: 25 mmol/L (ref 22–32)
Calcium: 9.5 mg/dL (ref 8.9–10.3)
Chloride: 106 mmol/L (ref 98–111)
Creatinine: 1.32 mg/dL — ABNORMAL HIGH (ref 0.44–1.00)
GFR, Est AFR Am: 51 mL/min — ABNORMAL LOW (ref >60)
GFR, Estimated: 44 mL/min — ABNORMAL LOW (ref >60)
Glucose, Bld: 112 mg/dL — ABNORMAL HIGH (ref 70–99)
Potassium: 4 mmol/L (ref 3.5–5.1)
Sodium: 140 mmol/L (ref 135–145)
Total Bilirubin: 0.4 mg/dL (ref 0.3–1.2)
Total Protein: 7.4 g/dL (ref 6.5–8.1)

## 2018-10-02 LAB — CBC WITH DIFFERENTIAL (CANCER CENTER ONLY)
Abs Immature Granulocytes: 0.01 10*3/uL (ref 0.00–0.07)
Basophils Absolute: 0 10*3/uL (ref 0.0–0.1)
Basophils Relative: 0 %
Eosinophils Absolute: 0.1 10*3/uL (ref 0.0–0.5)
Eosinophils Relative: 1 %
HCT: 30.6 % — ABNORMAL LOW (ref 36.0–46.0)
Hemoglobin: 9.4 g/dL — ABNORMAL LOW (ref 12.0–15.0)
Immature Granulocytes: 0 %
Lymphocytes Relative: 37 %
Lymphs Abs: 1.3 10*3/uL (ref 0.7–4.0)
MCH: 29.2 pg (ref 26.0–34.0)
MCHC: 30.7 g/dL (ref 30.0–36.0)
MCV: 95 fL (ref 80.0–100.0)
Monocytes Absolute: 0.3 10*3/uL (ref 0.1–1.0)
Monocytes Relative: 9 %
Neutro Abs: 1.8 10*3/uL (ref 1.7–7.7)
Neutrophils Relative %: 53 %
Platelet Count: 245 10*3/uL (ref 150–400)
RBC: 3.22 MIL/uL — ABNORMAL LOW (ref 3.87–5.11)
RDW: 13.2 % (ref 11.5–15.5)
WBC Count: 3.5 10*3/uL — ABNORMAL LOW (ref 4.0–10.5)
nRBC: 0 % (ref 0.0–0.2)

## 2018-10-02 MED ORDER — EPOETIN ALFA-EPBX 10000 UNIT/ML IJ SOLN
20000.0000 [IU] | Freq: Once | INTRAMUSCULAR | Status: DC
  Filled 2018-10-02: qty 2

## 2018-10-02 MED ORDER — EPOETIN ALFA-EPBX 10000 UNIT/ML IJ SOLN
20000.0000 [IU] | Freq: Once | INTRAMUSCULAR | Status: AC
  Administered 2018-10-02: 20000 [IU] via SUBCUTANEOUS
  Filled 2018-10-02: qty 2

## 2018-10-02 NOTE — Patient Instructions (Signed)

## 2018-10-02 NOTE — Progress Notes (Signed)
Hematology and Oncology Follow Up Visit  Karen [name] [id number] 04-Mar-1960 58 y.o. 10/02/2018   Principle Diagnosis:  Anemia of renal insufficiency Intermittent iron deficiency anemia  Current Therapy:   Retacrit 20,000 units subcutaneous for hemoglobin less than 10 IV iron as indicated   Interim History:  Karen [name] is here today for follow-up. She is doing well but does have some fatigue at times. She states that she worries quite a bit about her anemia.  Hgb today is 9.4, MCV 95.  She denies bleeding. No bruising or petechiae.  She has maintained a good appetite and is staying well hydrated. Her weight is stable.  No fever, chills, n/v, cough, rash, dizziness, headaches, SOB, chest pain, palpitations, abdominal pain or changes in bowel or bladder habits. 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   ECOG Performance Status: 1 - Symptomatic but completely ambulatory  Medications:  Allergies as of 10/02/2018      Reactions   Milk-related Compounds Other (See Comments)   Intolerance to milk products-causes gas      Medication List       Accurate as of October 02, 2018  3:31 PM. If you have any questions, ask your nurse or doctor.        buPROPion 150 MG 24 hr tablet Commonly known as: WELLBUTRIN XL Take 150 mg by mouth daily.   escitalopram 20 MG tablet Commonly known as: LEXAPRO Take 20 mg by mouth daily.   FOLIC ACID PO Take by mouth daily.   levothyroxine 125 MCG tablet Commonly known as: SYNTHROID TAKE 1 TABLET (125 MCG TOTAL) BY MOUTH DAILY.   nortriptyline 10 MG capsule Commonly known as: PAMELOR Take 1 capsule (10 mg total) by mouth at bedtime.   omeprazole 20 MG capsule Commonly known as: PRILOSEC TAKE 1 CAPSULE BY MOUTH EVERY DAY   OVER THE COUNTER MEDICATION   risperiDONE 1 MG tablet Commonly known as: RISPERDAL Take 1 mg by mouth at bedtime.   Vitamin D (Ergocalciferol) 1.25 MG (50000 UT) Caps capsule Commonly known as: DRISDOL  Take 1 capsule (50,000 Units total) by mouth every 14 (fourteen) days. For 8 weeks. Then every 2 weeks.       Allergies:  Allergies  Allergen Reactions  . Milk-Related Compounds Other (See Comments)    Intolerance to milk products-causes gas    Past Medical History, Surgical history, Social history, and Family History were reviewed and updated.  Review of Systems: All other 10 point review of systems is negative.   Physical Exam:  vitals were not taken for this visit.   Wt Readings from Last 3 Encounters:  08/17/18 216 lb 1.3 oz (98 kg)  07/06/18 211 lb (95.7 kg)  05/08/18 207 lb (93.9 kg)    Ocular: Sclerae unicteric, pupils equal, round and reactive to light Ear-nose-throat: Oropharynx clear, dentition fair Lymphatic: No cervical or supraclavicular adenopathy Lungs no rales or rhonchi, good excursion bilaterally Heart regular rate and rhythm, no murmur appreciated Abd soft, nontender, positive bowel sounds, no liver or spleen tip palpated on exam, no fluid wave  MSK no focal spinal tenderness, no joint edema Neuro: non-focal, well-oriented, appropriate affect Breasts: Deferred  Lab Results  Component Value Date   WBC 3.6 (L) 08/17/2018   HGB 9.4 (L) 08/17/2018   HCT 30.4 (L) 08/17/2018   MCV 94.7 08/17/2018   PLT 228 08/17/2018   Lab Results  Component Value Date   FERRITIN 283 08/17/2018   IRON 56 08/17/2018  TIBC 239 08/17/2018   UIBC 183 08/17/2018   IRONPCTSAT 23 08/17/2018   Lab Results  Component Value Date   RETICCTPCT 1.0 07/06/2018   RBC 3.21 (L) 08/17/2018   RETICCTABS 26.4 11/18/2014   No results found for: KPAFRELGTCHN, LAMBDASER, KAPLAMBRATIO No results found for: IGGSERUM, IGA, IGMSERUM No results found for: Odetta Pink, SPEI   Chemistry      Component Value Date/Time   NA 141 08/17/2018 0836   NA 141 11/01/2016 1504   K 4.0 08/17/2018 0836   K 3.4 11/01/2016 1504   CL 107  08/17/2018 0836   CL 109 (H) 11/01/2016 1504   CO2 27 08/17/2018 0836   CO2 29 11/01/2016 1504   BUN 12 08/17/2018 0836   BUN 8 11/01/2016 1504   CREATININE 0.97 08/17/2018 0836   CREATININE 1.4 (H) 11/01/2016 1504      Component Value Date/Time   CALCIUM 8.4 (L) 08/17/2018 0836   CALCIUM 8.8 11/01/2016 1504   ALKPHOS 78 08/17/2018 0836   ALKPHOS 89 (H) 11/01/2016 1504   AST 17 08/17/2018 0836   ALT 9 08/17/2018 0836   ALT 16 11/01/2016 1504   BILITOT 0.4 08/17/2018 0836       Impression and Plan: Karen [name] is a very pleasant 74 [name] with iron deficiency anemia and anemia secondary to chronic renal insufficiency with low erythropoietin level.  She received Retacrit today for Hgb 9.4.  We will plan to see her back in another 6 weeks.  She will contact our office with any questions or concerns. We can certainly see her sooner if needed.   Laverna Peace, NP 8/28/20203:31 PM

## 2018-10-03 ENCOUNTER — Telehealth: Payer: Self-pay | Admitting: Family Medicine

## 2018-10-03 DIAGNOSIS — R519 Headache, unspecified: Secondary | ICD-10-CM

## 2018-10-05 ENCOUNTER — Telehealth: Payer: Self-pay | Admitting: Family

## 2018-10-05 LAB — IRON AND TIBC
Iron: 75 ug/dL (ref 41–142)
Saturation Ratios: 31 % (ref 21–57)
TIBC: 243 ug/dL (ref 236–444)
UIBC: 168 ug/dL (ref 120–384)

## 2018-10-05 LAB — FERRITIN: Ferritin: 330 ng/mL — ABNORMAL HIGH (ref 11–307)

## 2018-10-05 NOTE — Telephone Encounter (Signed)
Patient requesting call back from CMA to discuss these medications.

## 2018-10-05 NOTE — Telephone Encounter (Signed)
See note

## 2018-10-05 NOTE — Telephone Encounter (Signed)
Called and LMVM regarding appointments added per 8/28 los

## 2018-10-05 NOTE — Telephone Encounter (Signed)
Left message to return call to office.

## 2018-10-06 NOTE — Telephone Encounter (Signed)
Spoke with patient and she wanted to know if PCP wanted her to continue taking Nortriptyline. Patient stated that she saw oncologist on Friday and they told her to follow-up with pcp on medication because it isn't something that you could just stop taking. Patient also stated that she was started on Retacrit and wanted to know if there was any contraindications with that and nortriptyline. Please advise.

## 2018-10-07 NOTE — Telephone Encounter (Signed)
Patient scheduled for Tuesday, 10/13/2018 for follow-up.

## 2018-10-07 NOTE — Telephone Encounter (Signed)
When I last saw her I asked her to arrange 4 weeks f/u. Thanks, BJ

## 2018-10-13 ENCOUNTER — Encounter: Payer: Self-pay | Admitting: Family Medicine

## 2018-10-13 ENCOUNTER — Telehealth (INDEPENDENT_AMBULATORY_CARE_PROVIDER_SITE_OTHER): Payer: Managed Care, Other (non HMO) | Admitting: Family Medicine

## 2018-10-13 ENCOUNTER — Other Ambulatory Visit: Payer: Self-pay

## 2018-10-13 VITALS — BP 130/85

## 2018-10-13 DIAGNOSIS — D500 Iron deficiency anemia secondary to blood loss (chronic): Secondary | ICD-10-CM | POA: Diagnosis not present

## 2018-10-13 DIAGNOSIS — R51 Headache: Secondary | ICD-10-CM

## 2018-10-13 DIAGNOSIS — R030 Elevated blood-pressure reading, without diagnosis of hypertension: Secondary | ICD-10-CM

## 2018-10-13 DIAGNOSIS — N183 Chronic kidney disease, stage 3 unspecified: Secondary | ICD-10-CM

## 2018-10-13 DIAGNOSIS — R519 Headache, unspecified: Secondary | ICD-10-CM

## 2018-10-13 NOTE — Progress Notes (Signed)
Virtual Visit via Video Note   I connected with Karen [name] on 10/13/18 by a video enabled telemedicine application and verified that I am speaking with the correct person using two identifiers.  Location patient: home Location provider:work office Persons participating in the virtual visit: patient, provider  I discussed the limitations of evaluation and management by telemedicine and the availability of in person appointments. The patient expressed understanding and agreed to proceed.   HPI: Karen Karen [name] is a 58 yo female with Hx of hypothyroidism,CKD III,anemia of chronic disease,and depression among some following on last visit. Last seen on 08/18/18, when she was complaining about worsening headache. Bitemporal dull/"nuggying" headache,mild. Tylenol extra straight Tylenol helps. Denies associated visual changes,photophbia,N/V,or focal weakness.  Headache has improved, she thinks it was related to stress and anemia. In 08/2018 her sister lost her husband in a motorcycle accident.  She is not sure if headache improved because Nortriptyline or because she started treatment for anemia with Epoetin on 10/02/18. Lab Results  Component Value Date   WBC 3.5 (L) 10/02/2018   HGB 9.4 (L) 10/02/2018   HCT 30.6 (L) 10/02/2018   MCV 95.0 10/02/2018   PLT 245 10/02/2018    She has Hx of depression and currently she is on Lexapro 20 mg and Wellbutrin XL 150 mg daily,and Risperdal 1 mg daily.  Last visit she reported elevated BP's, no Hx of HTN. + CKD III. Lab Results  Component Value Date   CREATININE 1.32 (H) 10/02/2018   BUN 14 10/02/2018   NA 140 10/02/2018   K 4.0 10/02/2018   CL 106 10/02/2018   CO2 25 10/02/2018    Home BP's 120-130/70-80's. Negative for chest pain, dyspnea, palpitation, claudication, focal weakness, or edema.   ROS: See pertinent positives and negatives per HPI.  Past Medical History:  Diagnosis Date  . Anemia   . Barrett esophagus   . Depression   . DUB  (dysfunctional uterine bleeding)   . GERD (gastroesophageal reflux disease)   . Graves disease   . Hypothyroidism     Past Surgical History:  Procedure Laterality Date  . CHOLECYSTECTOMY N/A 07/16/2012   Procedure: LAPAROSCOPIC CHOLECYSTECTOMY WITH INTRAOPERATIVE CHOLANGIOGRAM;  Surgeon: Joyice Faster. Cornett, MD;  Location: [facility];  Service: General;  Laterality: N/A;  . COLONOSCOPY    . FOOT SURGERY     Left  . UPPER GASTROINTESTINAL ENDOSCOPY      Family History  Problem Relation Age of Onset  . Hypertension Mother   . Asthma Mother   . Cancer Father 60       larynx cancer   . Breast cancer Maternal Aunt 60  . Colon cancer Neg Hx   . Colon polyps Neg Hx   . Esophageal cancer Neg Hx   . Rectal cancer Neg Hx   . Stomach cancer Neg Hx     Social History   Socioeconomic History  . Marital status: Married    Spouse name: Not on file  . Number of children: 0  . Years of education: Not on file  . Highest education level: Not on file  Occupational History    Employer: [employer]  . Financial resource strain: Not on file  . Food insecurity    Worry: Not on file    Inability: Not on file  . Transportation needs    Medical: Not on file    Non-medical: Not on file  Tobacco Use  . Smoking status: Former Smoker    Packs/day:  1.00    Years: 20.00    Pack years: 20.00    Types: Cigarettes    Start date: 03/13/1967    Quit date: 07/14/1987    Years since quitting: 31.2  . Smokeless tobacco: Never Used  . Tobacco comment: quit 26 years ago  Substance and Sexual Activity  . Alcohol use: No    Alcohol/week: 0.0 standard drinks  . Drug use: No  . Sexual activity: Not on file  Lifestyle  . Physical activity    Days per week: Not on file    Minutes per session: Not on file  . Stress: Not on file  Relationships  . Social Herbalist on phone: Not on file    Gets together: Not on file    Attends religious service: Not on file    Active member of club or  organization: Not on file    Attends meetings of clubs or organizations: Not on file    Relationship status: Not on file  . Intimate partner violence    Fear of current or ex partner: Not on file    Emotionally abused: Not on file    Physically abused: Not on file    Forced sexual activity: Not on file  Other Topics Concern  . Not on file  Social History Narrative   Occupation: Freight forwarder   Regular exercise- yes   0 caffeine drinks     Current Outpatient Medications:  .  buPROPion (WELLBUTRIN XL) 150 MG 24 hr tablet, Take 150 mg by mouth daily., Disp: , Rfl:  .  escitalopram (LEXAPRO) 20 MG tablet, Take 20 mg by mouth daily.  , Disp: , Rfl:  .  FOLIC ACID PO, Take by mouth daily., Disp: , Rfl:  .  levothyroxine (SYNTHROID) 125 MCG tablet, TAKE 1 TABLET BY MOUTH EVERY DAY, Disp: 30 tablet, Rfl: 5 .  omeprazole (PRILOSEC) 20 MG capsule, TAKE 1 CAPSULE BY MOUTH EVERY DAY, Disp: 30 capsule, Rfl: 5 .  OVER THE COUNTER MEDICATION, , Disp: , Rfl:  .  risperiDONE (RISPERDAL) 1 MG tablet, Take 1 mg by mouth at bedtime., Disp: , Rfl: 1 .  Vitamin D, Ergocalciferol, (DRISDOL) 1.25 MG (50000 UT) CAPS capsule, Take 1 capsule (50,000 Units total) by mouth every 14 (fourteen) days. For 8 weeks. Then every 2 weeks., Disp: 6 capsule, Rfl: 3  EXAM:  VITALS per patient if applicable:BP [id number]   GENERAL: alert, oriented, appears well and in no acute distress  HEENT: atraumatic, normocephalic,conjunctiva clear, no obvious abnormalities on inspection.  NECK: normal movements of the head and neck  LUNGS: on inspection no signs of respiratory distress, breathing rate appears normal, no obvious gross SOB, gasping or wheezing  CV: no obvious cyanosis  Karen: moves all visible extremities without noticeable abnormality  PSYCH/NEURO: pleasant and cooperative, no obvious depression or anxiety, speech and thought processing grossly intact  ASSESSMENT AND PLAN:  Discussed the following assessment  and plan:  Elevated blood pressure reading BP readings have improved. Continue monitoring BP. Low-salt diet also recommended. I will see her back here in the office late October or beginning of November for her CPE.  Stage 3 chronic kidney disease (HCC) Avoid NSAIDs. Low-salt diet and adequate hydration to continue. Good BP control.  Headache, unspecified headache type Improved. Because she thinks problem was mainly related to stress + anemia, the former one improved and latter one being treated; she agrees with weaning nortriptyline off in the next 10 days. Instructed  to monitor for changes, if headache becomes more frequent, we will resume amitriptyline.  Iron deficiency anemia due to chronic blood loss + Anemia of renal disease. Otherwise stable for the past couple months. Recently started on Epoetin. Continue following with hematologist.    I discussed the assessment and treatment plan with the patient. She was provided an opportunity to ask questions and all were answered. She agreed with the plan and demonstrated an understanding of the instructions.   The patient was advised to call back or seek an in-person evaluation if the symptoms worsen or if the condition fails to improve as anticipated.  Return in about 7 weeks (around 12/04/2018).     [name], MD

## 2018-10-16 NOTE — Progress Notes (Signed)
LMVM for the patient to contact the office to schedule a 7 weeks follow up appointment with Dr. [name].

## 2018-11-01 ENCOUNTER — Other Ambulatory Visit: Payer: Self-pay | Admitting: Family Medicine

## 2018-11-13 ENCOUNTER — Inpatient Hospital Stay: Payer: Managed Care, Other (non HMO)

## 2018-11-13 ENCOUNTER — Encounter: Payer: Self-pay | Admitting: Family

## 2018-11-13 ENCOUNTER — Inpatient Hospital Stay: Payer: Managed Care, Other (non HMO) | Attending: Hematology & Oncology

## 2018-11-13 ENCOUNTER — Other Ambulatory Visit: Payer: Self-pay

## 2018-11-13 ENCOUNTER — Inpatient Hospital Stay (HOSPITAL_BASED_OUTPATIENT_CLINIC_OR_DEPARTMENT_OTHER): Payer: Managed Care, Other (non HMO) | Admitting: Family

## 2018-11-13 VITALS — BP 141/79 | HR 62 | Temp 97.7°F | Resp 18 | Ht 70.0 in | Wt 218.0 lb

## 2018-11-13 DIAGNOSIS — D500 Iron deficiency anemia secondary to blood loss (chronic): Secondary | ICD-10-CM

## 2018-11-13 DIAGNOSIS — D631 Anemia in chronic kidney disease: Secondary | ICD-10-CM | POA: Diagnosis present

## 2018-11-13 DIAGNOSIS — N189 Chronic kidney disease, unspecified: Secondary | ICD-10-CM | POA: Diagnosis present

## 2018-11-13 LAB — CBC WITH DIFFERENTIAL (CANCER CENTER ONLY)
Abs Immature Granulocytes: 0.01 10*3/uL (ref 0.00–0.07)
Basophils Absolute: 0 10*3/uL (ref 0.0–0.1)
Basophils Relative: 0 %
Eosinophils Absolute: 0 10*3/uL (ref 0.0–0.5)
Eosinophils Relative: 1 %
HCT: 31.6 % — ABNORMAL LOW (ref 36.0–46.0)
Hemoglobin: 9.6 g/dL — ABNORMAL LOW (ref 12.0–15.0)
Immature Granulocytes: 0 %
Lymphocytes Relative: 42 %
Lymphs Abs: 1.4 10*3/uL (ref 0.7–4.0)
MCH: 28.6 pg (ref 26.0–34.0)
MCHC: 30.4 g/dL (ref 30.0–36.0)
MCV: 94 fL (ref 80.0–100.0)
Monocytes Absolute: 0.4 10*3/uL (ref 0.1–1.0)
Monocytes Relative: 10 %
Neutro Abs: 1.6 10*3/uL — ABNORMAL LOW (ref 1.7–7.7)
Neutrophils Relative %: 47 %
Platelet Count: 222 10*3/uL (ref 150–400)
RBC: 3.36 MIL/uL — ABNORMAL LOW (ref 3.87–5.11)
RDW: 12.3 % (ref 11.5–15.5)
WBC Count: 3.4 10*3/uL — ABNORMAL LOW (ref 4.0–10.5)
nRBC: 0 % (ref 0.0–0.2)

## 2018-11-13 LAB — CMP (CANCER CENTER ONLY)
ALT: 12 U/L (ref 0–44)
AST: 19 U/L (ref 15–41)
Albumin: 4.1 g/dL (ref 3.5–5.0)
Alkaline Phosphatase: 81 U/L (ref 38–126)
Anion gap: 8 (ref 5–15)
BUN: 10 mg/dL (ref 6–20)
CO2: 24 mmol/L (ref 22–32)
Calcium: 9.4 mg/dL (ref 8.9–10.3)
Chloride: 104 mmol/L (ref 98–111)
Creatinine: 1.14 mg/dL — ABNORMAL HIGH (ref 0.44–1.00)
GFR, Est AFR Am: >60 mL/min (ref >60)
GFR, Estimated: 53 mL/min — ABNORMAL LOW (ref >60)
Glucose, Bld: 79 mg/dL (ref 70–99)
Potassium: 3.5 mmol/L (ref 3.5–5.1)
Sodium: 136 mmol/L (ref 135–145)
Total Bilirubin: 0.4 mg/dL (ref 0.3–1.2)
Total Protein: 7.4 g/dL (ref 6.5–8.1)

## 2018-11-13 MED ORDER — EPOETIN ALFA-EPBX 10000 UNIT/ML IJ SOLN
20000.0000 [IU] | Freq: Once | INTRAMUSCULAR | Status: AC
  Administered 2018-11-13: 20000 [IU] via SUBCUTANEOUS
  Filled 2018-11-13: qty 2

## 2018-11-13 NOTE — Progress Notes (Signed)
Hematology and Oncology Follow Up Visit  Karen [name] Brewster:[id number] November 17, 1960 58 y.o. 11/13/2018   Principle Diagnosis:  Anemia of renal insufficiency Intermittent iron deficiency anemia  Current Therapy:   Retacrit 20,000 units subcutaneous for hemoglobin less than 10 IV iron as indicated   Interim History:  Karen [name] is here today for follow-up. She is doing well but still has fatigue.  Hgb today is 9.6. No episodes of bleeding. No bruising or petechiae.  No fever, chills, n/v, cough, rash, dizziness, SOB, chest pain, palpitations, abdominal pain or changes in bowel or bladder habits.  No swelling, tenderness, numbness or tingling in her extremities.  She is eating well and staying hydrated. Her weight is stable.   ECOG Performance Status: 1 - Symptomatic but completely ambulatory  Medications:  Allergies as of 11/13/2018      Reactions   Milk-related Compounds Other (See Comments)   Intolerance to milk products-causes gas      Medication List       Accurate as of November 13, 2018  2:50 PM. If you have any questions, ask your nurse or doctor.        buPROPion 150 MG 24 hr tablet Commonly known as: WELLBUTRIN XL Take 150 mg by mouth daily.   escitalopram 20 MG tablet Commonly known as: LEXAPRO Take 20 mg by mouth daily.   FOLIC ACID PO Take by mouth daily.   levothyroxine 125 MCG tablet Commonly known as: SYNTHROID TAKE 1 TABLET BY MOUTH EVERY DAY   omeprazole 20 MG capsule Commonly known as: PRILOSEC TAKE 1 CAPSULE BY MOUTH EVERY DAY   OVER THE COUNTER MEDICATION   risperiDONE 1 MG tablet Commonly known as: RISPERDAL Take 1 mg by mouth at bedtime.   Vitamin D (Ergocalciferol) 1.25 MG (50000 UT) Caps capsule Commonly known as: DRISDOL Take 1 capsule (50,000 Units total) by mouth every 14 (fourteen) days. For 8 weeks. Then every 2 weeks.       Allergies:  Allergies  Allergen Reactions  . Milk-Related Compounds Other (See Comments)   Intolerance to milk products-causes gas    Past Medical History, Surgical history, Social history, and Family History were reviewed and updated.  Review of Systems: All other 10 point review of systems is negative.   Physical Exam:  vitals were not taken for this visit.   Wt Readings from Last 3 Encounters:  10/02/18 219 lb 12.8 oz (99.7 kg)  08/17/18 216 lb 1.3 oz (98 kg)  07/06/18 211 lb (95.7 kg)    Ocular: Sclerae unicteric, pupils equal, round and reactive to light Ear-nose-throat: Oropharynx clear, dentition fair Lymphatic: No cervical or supraclavicular adenopathy Lungs no rales or rhonchi, good excursion bilaterally Heart regular rate and rhythm, no murmur appreciated Abd soft, nontender, positive bowel sounds, no liver or spleen tip palpated on exam, no fluid wave  MSK no focal spinal tenderness, no joint edema Neuro: non-focal, well-oriented, appropriate affect Breasts: Deferred   Lab Results  Component Value Date   WBC 3.5 (L) 10/02/2018   HGB 9.4 (L) 10/02/2018   HCT 30.6 (L) 10/02/2018   MCV 95.0 10/02/2018   PLT 245 10/02/2018   Lab Results  Component Value Date   FERRITIN 330 (H) 10/02/2018   IRON 75 10/02/2018   TIBC 243 10/02/2018   UIBC 168 10/02/2018   IRONPCTSAT 31 10/02/2018   Lab Results  Component Value Date   RETICCTPCT 1.0 07/06/2018   RBC 3.22 (L) 10/02/2018   RETICCTABS 26.4 11/18/2014   No  results found for: KPAFRELGTCHN, LAMBDASER, KAPLAMBRATIO No results found for: IGGSERUM, IGA, IGMSERUM No results found for: Odetta Pink, SPEI   Chemistry      Component Value Date/Time   NA 140 10/02/2018 1511   NA 141 11/01/2016 1504   K 4.0 10/02/2018 1511   K 3.4 11/01/2016 1504   CL 106 10/02/2018 1511   CL 109 (H) 11/01/2016 1504   CO2 25 10/02/2018 1511   CO2 29 11/01/2016 1504   BUN 14 10/02/2018 1511   BUN 8 11/01/2016 1504   CREATININE 1.32 (H) 10/02/2018 1511   CREATININE  1.4 (H) 11/01/2016 1504      Component Value Date/Time   CALCIUM 9.5 10/02/2018 1511   CALCIUM 8.8 11/01/2016 1504   ALKPHOS 89 10/02/2018 1511   ALKPHOS 89 (H) 11/01/2016 1504   AST 23 10/02/2018 1511   ALT 17 10/02/2018 1511   ALT 16 11/01/2016 1504   BILITOT 0.4 10/02/2018 1511       Impression and Plan: Karen [name] is a very pleasant 58[name] with iron deficiency anemia and anemia secondary to chronic renal insufficiency with low erythropoietin level. She received Retacrit today for Hgb 9.6.  We will see her in another month for lab and injection and follow-up in 2 months.  She will contact our office with any questions or concerns. We can certainly see her sooner if needed.   Laverna Peace, NP 10/9/20202:50 PM

## 2018-11-13 NOTE — Patient Instructions (Signed)

## 2018-11-16 LAB — IRON AND TIBC
Iron: 67 ug/dL (ref 41–142)
Saturation Ratios: 29 % (ref 21–57)
TIBC: 234 ug/dL — ABNORMAL LOW (ref 236–444)
UIBC: 167 ug/dL (ref 120–384)

## 2018-11-16 LAB — FERRITIN: Ferritin: 361 ng/mL — ABNORMAL HIGH (ref 11–307)

## 2018-11-18 ENCOUNTER — Telehealth: Payer: Self-pay | Admitting: Hematology and Oncology

## 2018-11-18 NOTE — Telephone Encounter (Signed)
I received a call from Ms. Karen [name] wanting a 2nd opinion for anemia. She's a current pt of Dr. Marin [name] and has requested to see Dr. Lindi [name] to see if there are other options. She's been scheduled to see Dr. Lindi [name] on 11/11 at 1pm. She's been made aware to arrive 20 minutes early.

## 2018-11-24 ENCOUNTER — Other Ambulatory Visit: Payer: Self-pay

## 2018-11-24 ENCOUNTER — Ambulatory Visit (INDEPENDENT_AMBULATORY_CARE_PROVIDER_SITE_OTHER): Payer: Managed Care, Other (non HMO) | Admitting: Family Medicine

## 2018-11-24 ENCOUNTER — Telehealth: Payer: Self-pay | Admitting: Hematology and Oncology

## 2018-11-24 ENCOUNTER — Encounter: Payer: Self-pay | Admitting: Family Medicine

## 2018-11-24 VITALS — BP 90/60 | HR 67 | Temp 97.0°F | Resp 16 | Ht 70.0 in | Wt 213.2 lb

## 2018-11-24 DIAGNOSIS — Z130 Encounter for screening for diseases of the blood and blood-forming organs and certain disorders involving the immune mechanism: Secondary | ICD-10-CM | POA: Diagnosis not present

## 2018-11-24 DIAGNOSIS — D649 Anemia, unspecified: Secondary | ICD-10-CM

## 2018-11-24 DIAGNOSIS — N1831 Chronic kidney disease, stage 3a: Secondary | ICD-10-CM

## 2018-11-24 DIAGNOSIS — E890 Postprocedural hypothyroidism: Secondary | ICD-10-CM | POA: Diagnosis not present

## 2018-11-24 DIAGNOSIS — Z0000 Encounter for general adult medical examination without abnormal findings: Secondary | ICD-10-CM | POA: Diagnosis not present

## 2018-11-24 DIAGNOSIS — Z13220 Encounter for screening for lipoid disorders: Secondary | ICD-10-CM | POA: Diagnosis not present

## 2018-11-24 DIAGNOSIS — Z79899 Other long term (current) drug therapy: Secondary | ICD-10-CM

## 2018-11-24 DIAGNOSIS — Z23 Encounter for immunization: Secondary | ICD-10-CM | POA: Diagnosis not present

## 2018-11-24 DIAGNOSIS — Z1329 Encounter for screening for other suspected endocrine disorder: Secondary | ICD-10-CM

## 2018-11-24 DIAGNOSIS — M2550 Pain in unspecified joint: Secondary | ICD-10-CM

## 2018-11-24 DIAGNOSIS — E559 Vitamin D deficiency, unspecified: Secondary | ICD-10-CM | POA: Diagnosis not present

## 2018-11-24 DIAGNOSIS — Z13228 Encounter for screening for other metabolic disorders: Secondary | ICD-10-CM

## 2018-11-24 LAB — LIPID PANEL
Cholesterol: 177 mg/dL (ref 0–200)
HDL: 45.2 mg/dL (ref >39.00)
LDL Cholesterol: 117 mg/dL — ABNORMAL HIGH (ref 0–99)
NonHDL: 132.03
Total CHOL/HDL Ratio: 4
Triglycerides: 76 mg/dL (ref 0.0–149.0)
VLDL: 15.2 mg/dL (ref 0.0–40.0)

## 2018-11-24 LAB — COMPREHENSIVE METABOLIC PANEL
ALT: 12 U/L (ref 0–35)
AST: 19 U/L (ref 0–37)
Albumin: 4.3 g/dL (ref 3.5–5.2)
Alkaline Phosphatase: 80 U/L (ref 39–117)
BUN: 15 mg/dL (ref 6–23)
CO2: 26 mEq/L (ref 19–32)
Calcium: 9.4 mg/dL (ref 8.4–10.5)
Chloride: 107 mEq/L (ref 96–112)
Creatinine, Ser: 1.08 mg/dL (ref 0.40–1.20)
GFR: 62.72 mL/min (ref >60.00)
Glucose, Bld: 100 mg/dL — ABNORMAL HIGH (ref 70–99)
Potassium: 4 mEq/L (ref 3.5–5.1)
Sodium: 141 mEq/L (ref 135–145)
Total Bilirubin: 0.7 mg/dL (ref 0.2–1.2)
Total Protein: 7.4 g/dL (ref 6.0–8.3)

## 2018-11-24 LAB — CBC
HCT: 33.6 % — ABNORMAL LOW (ref 36.0–46.0)
Hemoglobin: 10.6 g/dL — ABNORMAL LOW (ref 12.0–15.0)
MCHC: 31.5 g/dL (ref 30.0–36.0)
MCV: 92.4 fl (ref 78.0–100.0)
Platelets: 266 10*3/uL (ref 150.0–400.0)
RBC: 3.63 Mil/uL — ABNORMAL LOW (ref 3.87–5.11)
RDW: 13.6 % (ref 11.5–15.5)
WBC: 2.6 10*3/uL — ABNORMAL LOW (ref 4.0–10.5)

## 2018-11-24 LAB — HEMOGLOBIN A1C: Hgb A1c MFr Bld: 5.4 % (ref 4.6–6.5)

## 2018-11-24 LAB — TSH: TSH: 0.51 u[IU]/mL (ref 0.35–4.50)

## 2018-11-24 LAB — VITAMIN D 25 HYDROXY (VIT D DEFICIENCY, FRACTURES): VITD: 44.24 ng/mL (ref 30.00–100.00)

## 2018-11-24 LAB — VITAMIN B12: Vitamin B-12: 445 pg/mL (ref 211–911)

## 2018-11-24 NOTE — Progress Notes (Signed)
HPI:  Chief Complaint  Patient presents with   Annual Exam   Follow-up    Karen [name] is a 58 y.o. female, who is here today for her routine CPE. Last CPE:11/24/17  She lives with her husband. Regular exercise:Not consistently. Healthful diet:No.  Hx of hypothyroidism s/p thyroidectomy,depression /bipolar disorder (follows with psychiatrist), CKD III,anemia , vit D def,and hypoK+ among some.  Immunization History  Administered Date(s) Administered   Influenza Split 11/06/2010, 12/18/2011   Influenza Whole 10/22/2007, 10/28/2008, 11/02/2009   Influenza,inj,Quad PF,6+ Mos 11/25/2012, 12/15/2013, 10/11/2015, 11/22/2016, 11/24/2017, 11/24/2018   PPD Test 08/22/2014   Pneumococcal Polysaccharide-23 11/22/2016   Td 02/04/1998, 10/28/2008   Tdap 11/24/2018   Zoster Recombinat (Shingrix) 11/24/2017, 01/23/2018    Last Pap smear: 1021/2019.Marland Kitchen History of abnormal Pap smear:Denies. History of STDs:Negative   Mammogram: 08/04/18, Bi-Rads 1. Colon cancer screening: Colonoscopy 11/2009. DEXA: N/A  Hep C screening: 11/2016 NR  Lab Results  Component Value Date   CHOL 186 11/24/2017   HDL 55.90 11/24/2017   LDLCALC 116 (H) 11/24/2017   LDLDIRECT 148.9 10/25/2010   TRIG 70.0 11/24/2017   CHOLHDL 3 11/24/2017   Lab Results  Component Value Date   [id number] 08/15/2006   Frustrated because treatment for anemia does not seem to be helping. She is going to see a different hematology for a "second opinion", Dr Sonny Dandy. She is also having severe right shoulder, right knee, and left knee pain attributed to medication for anemia. Tylenol does not help. Pain is exacerbated by movement and alleviated by rest. No edema or erythema. No hx of trauma.   Lab Results  Component Value Date   WBC 3.4 (L) 11/13/2018   HGB 9.6 (L) 11/13/2018   HCT 31.6 (L) 11/13/2018   MCV 94.0 11/13/2018   PLT 222 11/13/2018   She is requesting blood work ordered by her  psychiatrist.She would like CBC repeated. B12,vit D,cmp,cbc,and CMP.  CKD III: She has not noted gross hematuria,foam in urine,or decreased urine output. She has checked BP and readings are "up and down", 90's-140's/70's. She is not following low salt diet.  Vitamin D deficiency: Currently she is on ergocalciferol 50,000 units every other week. 25 OH vit D was 10.5 2 years ago.  No known history of B12 deficiency.  Hypothyroidism: She is on 08/2018 TSH was 1.15. She has not noted cold/heat intolerance,bowel habits changes, tremor.  Review of Systems  Constitutional: Negative for appetite change and fever. + Fatigue. HENT: Negative for dental problem, hearing loss, mouth sores, sore throat, trouble swallowing and voice change.   Eyes: Negative for redness and visual disturbance.  Respiratory: Negative for cough, shortness of breath and wheezing.   Cardiovascular: Negative for chest pain and leg swelling.  Gastrointestinal: Negative for abdominal pain, nausea and vomiting.       No changes in bowel habits.  Endocrine: Negative for cold intolerance, heat intolerance, polydipsia, polyphagia and polyuria.  Genitourinary: Negative for decreased urine volume, dysuria, hematuria, vaginal bleeding and vaginal discharge.  Musculoskeletal: Positive for arthralgias, negative for gait problem and myalgias.  Skin: Negative for color change and rash.  Allergic/Immunologic: Negative for environmental allergies.  Neurological: Negative for syncope, weakness and headaches.  Hematological: Negative for adenopathy. Does not bruise/bleed easily.  Psychiatric/Behavioral: Negative for confusion and sleep disturbance. The patient is nervous/anxious.   All other systems reviewed and are negative.    Current Outpatient Medications on File Prior to Visit  Medication Sig Dispense Refill   buPROPion (  WELLBUTRIN XL) 150 MG 24 hr tablet Take 150 mg by mouth daily.     escitalopram (LEXAPRO) 20 MG tablet  Take 20 mg by mouth daily.       FOLIC ACID PO Take by mouth daily.     levothyroxine (SYNTHROID) 125 MCG tablet TAKE 1 TABLET BY MOUTH EVERY DAY 30 tablet 5   omeprazole (PRILOSEC) 20 MG capsule TAKE 1 CAPSULE BY MOUTH EVERY DAY 30 capsule 5   OVER THE COUNTER MEDICATION      risperiDONE (RISPERDAL) 1 MG tablet Take 1 mg by mouth at bedtime.  1   Vitamin D, Ergocalciferol, (DRISDOL) 1.25 MG (50000 UT) CAPS capsule Take 1 capsule (50,000 Units total) by mouth every 14 (fourteen) days. For 8 weeks. Then every 2 weeks. 6 capsule 3   No current facility-administered medications on file prior to visit.     Past Medical History:  Diagnosis Date   Anemia    Barrett esophagus    Depression    DUB (dysfunctional uterine bleeding)    GERD (gastroesophageal reflux disease)    Graves disease    Hypothyroidism     Allergies  Allergen Reactions   Milk-Related Compounds Other (See Comments)    Intolerance to milk products-causes gas    Social History   Socioeconomic History   Marital status: Married    Spouse name: Not on file   Number of children: 0   Years of education: Not on file   Highest education level: Not on file  Occupational History    Employer: [employer]   Financial resource strain: Not on file   Food insecurity    Worry: Not on file    Inability: Not on file   Transportation needs    Medical: Not on file    Non-medical: Not on file  Tobacco Use   Smoking status: Former Smoker    Packs/day: 1.00    Years: 20.00    Pack years: 20.00    Types: Cigarettes    Start date: 03/13/1967    Quit date: 07/14/1987    Years since quitting: 31.3   Smokeless tobacco: Never Used   Tobacco comment: quit 26 years ago  Substance and Sexual Activity   Alcohol use: No    Alcohol/week: 0.0 standard drinks   Drug use: No   Sexual activity: Not on file  Lifestyle   Physical activity    Days per week: Not on file    Minutes per session: Not on  file   Stress: Not on file  Relationships   Social connections    Talks on phone: Not on file    Gets together: Not on file    Attends religious service: Not on file    Active member of club or organization: Not on file    Attends meetings of clubs or organizations: Not on file    Relationship status: Not on file  Other Topics Concern   Not on file  Social History Narrative   Occupation: Freight forwarder   Regular exercise- yes   0 caffeine drinks     Today's Vitals   11/24/18 1158  BP: 90/60  Pulse: 67  Resp: 16  Temp: (!) 97 F (36.1 C)  TempSrc: Other (Comment)  SpO2: 96%  Weight: 213 lb 3.2 oz (96.7 kg)  Height: 5\' 10"  (1.778 m)   Body mass index is 30.59 kg/m.   Wt Readings from Last 3 Encounters:  11/24/18 213 lb 3.2 oz (96.7  kg)  11/13/18 218 lb (98.9 kg)  10/02/18 219 lb 12.8 oz (99.7 kg)    Physical Exam  Nursing note and vitals reviewed. Constitutional: She is oriented to person, place, and time. She appears well-developed. No distress.  HENT:  Head: Normocephalic and atraumatic.  Right Ear: Hearing, tympanic membrane, external ear and ear canal normal.  Left Ear: Hearing, tympanic membrane, external ear and ear canal normal.  Mouth/Throat: Uvula is midline, oropharynx is clear and moist and mucous membranes are normal.  Eyes: Pupils are equal, round, and reactive to light. Conjunctivae and EOM are normal.  Neck: No tracheal deviation present. No thyromegaly present.  Cardiovascular: Normal rate and regular rhythm.  No murmur heard. Pulses:      Dorsalis pedis pulses are 2+ on the right side, and 2+ on the left side.  Respiratory: Effort normal and breath sounds normal. No respiratory distress.  GI: Soft. She exhibits no mass. There is no hepatomegaly. There is no tenderness.  Genitourinary:  Genitourinary Comments: Breast: No masses, skin abnormalities, or nipple discharge appreciated bilateral.  Musculoskeletal:        General: No edema.      Comments: No major deformity or signs of synovitis appreciated.  Lymphadenopathy:    She has no cervical or axillary adenopathy.       Right: No supraclavicular adenopathy present.       Left: No supraclavicular adenopathy present.  Neurological: She is alert and oriented to person, place, and time. She has normal strength. No cranial nerve deficit. Coordination and gait normal.  Reflex Scores:      Bicep reflexes are 2+ on the right side and 2+ on the left side.      Patellar reflexes are 2+ on the right side and 2+ on the left side. Skin: Skin is warm. No rash noted. No erythema.  Psychiatric: She is anxious. No depressed mood appreciated. Well groomed, good eye contact.    ASSESSMENT AND PLAN:  Ms Sundt was seen today for her CPE and to address some concerns/chronic medical problems.  Orders Placed This Encounter  Procedures   Tdap vaccine greater than or equal to 7yo IM   Flu Vaccine QUAD 36+ mos IM   Comprehensive metabolic panel   Lipid panel   TSH   CBC   Hemoglobin A1c   VITAMIN D 25 Hydroxy (Vit-D Deficiency, Fractures)   Vitamin B12   Lab Results  Component Value Date   ALT 12 11/24/2018   AST 19 11/24/2018   ALKPHOS 80 11/24/2018   BILITOT 0.7 11/24/2018   Lab Results  Component Value Date   CREATININE 1.08 11/24/2018   BUN 15 11/24/2018   NA 141 11/24/2018   K 4.0 11/24/2018   CL 107 11/24/2018   CO2 26 11/24/2018    Lab Results  Component Value Date   [id number] 11/24/2018   Lab Results  Component Value Date   WBC 2.6 (L) 11/24/2018   HGB 10.6 (L) 11/24/2018   HCT 33.6 (L) 11/24/2018   MCV 92.4 11/24/2018   PLT 266.0 11/24/2018   Lab Results  Component Value Date   TSH 0.51 11/24/2018   Lab Results  Component Value Date   CHOL 177 11/24/2018   HDL 45.20 11/24/2018   LDLCALC 117 (H) 11/24/2018   LDLDIRECT 148.9 10/25/2010   TRIG 76.0 11/24/2018   CHOLHDL 4 11/24/2018    Routine general medical examination at a health  care facility We discussed the importance of regular physical activity  and healthy diet for prevention of chronic illness and/or complications. Preventive guidelines reviewed. Vaccination updated.  Ca++ and vit D supplementation recommended. Next CPE in a year.  The 10-year ASCVD risk score Mikey Bussing DC Brooke Bonito., et al., 2013) is: 1.7%   Values used to calculate the score:     Age: 12 years     Sex: Female     Is Non-Hispanic African American: Yes     Diabetic: No     Tobacco smoker: No     Systolic Blood Pressure: 90 mmHg     Is BP treated: No     HDL Cholesterol: 45.2 mg/dL     Total Cholesterol: 177 mg/dL  Need for Tdap vaccination  -     Tdap vaccine greater than or equal to 7yo IM  Need for immunization against influenza -     Flu Vaccine QUAD 36+ mos IM  High risk medication use Labs requested by her psychiatrist done, results will be faxed to Dr Dorethea Clan. -     Hemoglobin A1c  Anemia, unspecified type Stable. Not improving with treatment. Follows with hematologist.  -     CBC -     Vitamin B12  Stage 3a chronic kidney disease Problem has been stable. Adequate hydration. Avoid NSAID's. Low salt diet. Continue monitoring BP regularly.  Postablative hypothyroidism No changes in current management, will follow labs done today and will give further recommendations accordingly.  Screening for lipoid disorders -     Lipid panel  Screening for endocrine, metabolic and immunity disorder -     Hemoglobin A1c  Vitamin D deficiency, unspecified No changes in current management, will follow labs done today and will give further recommendations accordingly.  Arthralgia, unspecified joint We discussed possible causes, ? OA. She attributes problem to medication she is receiving for anemia. Recommend addressing this with her hematologist. Tylenol 500 mg tid.    Return in 1 year (on 11/24/2019) for cpe.   Madeleyn Schwimmer [name], MD [facility]. [place] office.

## 2018-11-24 NOTE — Telephone Encounter (Signed)
Returned patient's phone call regarding rescheduling an appointment, forward message and patient should get a call back about rescheduling.

## 2018-11-24 NOTE — Patient Instructions (Addendum)
Health Maintenance Due  Topic Date Due  . INFLUENZA VACCINE  09/05/2018  . TETANUS/TDAP  10/29/2018    No flowsheet data found.   Today you have you routine preventive visit.  At least 150 minutes of moderate exercise per week, daily brisk walking for 15-30 min is a good exercise option. Healthy diet low in saturated (animal) fats and sweets and consisting of fresh fruits and vegetables, lean meats such as fish and white chicken and whole grains.  These are some of recommendations for screening depending of age and risk factors:   - Vaccines:  Tdap vaccine every 10 years.  Shingles vaccine recommended at age 64, could be given after 59 years of age but not sure about insurance coverage.   Pneumonia vaccines:  Prevnar 13 at 65 and Pneumovax at 92. Sometimes Pneumovax is giving earlier if history of smoking, lung disease,diabetes,kidney disease among some.    Screening for diabetes at age 18 and every 3 years.  Cervical cancer prevention:  Pap smear starts at 59 years of age and continues periodically until 59 years old in low risk women. Pap smear every 3 years between 32 and 67 years old. Pap smear every 3-5 years between women 3 and older if pap smear negative and HPV screening negative.   -Breast cancer: Mammogram: There is disagreement between experts about when to start screening in low risk asymptomatic female but recent recommendations are to start screening at 70 and not later than 59 years old , every 1-2 years and after 59 yo q 2 years. Screening is recommended until 59 years old but some women can continue screening depending of healthy issues.   Colon cancer screening: starts at 59 years old until 59 years old.  Cholesterol disorder screening at age 85 and every 3 years.  Also recommended:  1. Dental visit- Brush and floss your teeth twice daily; visit your dentist twice a year. 2. Eye doctor- Get an eye exam at least every 2 years. 3. Helmet use- Always wear  a helmet when riding a bicycle, motorcycle, rollerblading or skateboarding. 4. Safe sex- If you may be exposed to sexually transmitted infections, use a condom. 5. Seat belts- Seat belts can save your live; always wear one. 6. Smoke/Carbon Monoxide detectors- These detectors need to be installed on the appropriate level of your home. Replace batteries at least once a year. 7. Skin cancer- When out in the sun please cover up and use sunscreen 15 SPF or higher. 8. Violence- If anyone is threatening or hurting you, please tell your healthcare provider.  9. Drink alcohol in moderation- Limit alcohol intake to one drink or less per day. Never drink and drive.

## 2018-11-25 ENCOUNTER — Telehealth: Payer: Self-pay | Admitting: Hematology and Oncology

## 2018-11-25 NOTE — Telephone Encounter (Signed)
Karen [name] cld to reschedule her appt w/Dr. Lindi Adie to 11/23 at 345pm.

## 2018-11-25 NOTE — Telephone Encounter (Signed)
I received a msg to reschedule Karen [name]'s appt w/Dr. Lindi Adie. I cld and left the pt a vm to return my call.

## 2018-11-26 ENCOUNTER — Encounter: Payer: Self-pay | Admitting: Family Medicine

## 2018-11-30 ENCOUNTER — Telehealth: Payer: Self-pay | Admitting: Family

## 2018-11-30 NOTE — Telephone Encounter (Signed)
Called and LM for patient again to call back to schedule her appointments from 11/6 that she cancelled

## 2018-12-10 ENCOUNTER — Encounter: Payer: Managed Care, Other (non HMO) | Admitting: Hematology

## 2018-12-11 ENCOUNTER — Ambulatory Visit: Payer: Managed Care, Other (non HMO)

## 2018-12-11 ENCOUNTER — Ambulatory Visit: Payer: Managed Care, Other (non HMO) | Admitting: Hematology & Oncology

## 2018-12-11 ENCOUNTER — Other Ambulatory Visit: Payer: Managed Care, Other (non HMO)

## 2018-12-14 ENCOUNTER — Other Ambulatory Visit: Payer: Managed Care, Other (non HMO)

## 2018-12-14 ENCOUNTER — Ambulatory Visit: Payer: Managed Care, Other (non HMO)

## 2018-12-14 ENCOUNTER — Ambulatory Visit: Payer: Managed Care, Other (non HMO) | Admitting: Hematology & Oncology

## 2018-12-16 ENCOUNTER — Encounter: Payer: Managed Care, Other (non HMO) | Admitting: Hematology and Oncology

## 2018-12-27 NOTE — Progress Notes (Signed)
[facility] CONSULT NOTE  Patient Care Team: [name], [name], MD as PCP - General (Family Medicine)  CHIEF COMPLAINTS/PURPOSE OF CONSULTATION:  Newly diagnosed anemia  HISTORY OF PRESENTING ILLNESS:  Karen [name] 58 y.o. female is here because of recent diagnosis of anemia. She is a patient of Dr. Marin Olp and has requested a second opinion. She presents to the clinic today for initial evaluation and discussion of treatment options.   I reviewed her records extensively and collaborated the history with the patient.  MEDICAL HISTORY:  Past Medical History:  Diagnosis Date  . Anemia   . Barrett esophagus   . Depression   . DUB (dysfunctional uterine bleeding)   . GERD (gastroesophageal reflux disease)   . Graves disease   . Hypothyroidism     SURGICAL HISTORY: Past Surgical History:  Procedure Laterality Date  . CHOLECYSTECTOMY N/A 07/16/2012   Procedure: LAPAROSCOPIC CHOLECYSTECTOMY WITH INTRAOPERATIVE CHOLANGIOGRAM;  Surgeon: Joyice Faster. Cornett, MD;  Location: [facility];  Service: General;  Laterality: N/A;  . COLONOSCOPY    . FOOT SURGERY     Left  . UPPER GASTROINTESTINAL ENDOSCOPY      SOCIAL HISTORY: Social History   Socioeconomic History  . Marital status: Married    Spouse name: Not on file  . Number of children: 0  . Years of education: Not on file  . Highest education level: Not on file  Occupational History    Employer: [employer]  . Financial resource strain: Not on file  . Food insecurity    Worry: Not on file    Inability: Not on file  . Transportation needs    Medical: Not on file    Non-medical: Not on file  Tobacco Use  . Smoking status: Former Smoker    Packs/day: 1.00    Years: 20.00    Pack years: 20.00    Types: Cigarettes    Start date: 03/13/1967    Quit date: 07/14/1987    Years since quitting: 31.4  . Smokeless tobacco: Never Used  . Tobacco comment: quit 26 years ago  Substance and Sexual Activity  . Alcohol  use: No    Alcohol/week: 0.0 standard drinks  . Drug use: No  . Sexual activity: Not on file  Lifestyle  . Physical activity    Days per week: Not on file    Minutes per session: Not on file  . Stress: Not on file  Relationships  . Social Herbalist on phone: Not on file    Gets together: Not on file    Attends religious service: Not on file    Active member of club or organization: Not on file    Attends meetings of clubs or organizations: Not on file    Relationship status: Not on file  . Intimate partner violence    Fear of current or ex partner: Not on file    Emotionally abused: Not on file    Physically abused: Not on file    Forced sexual activity: Not on file  Other Topics Concern  . Not on file  Social History Narrative   Occupation: Freight forwarder   Regular exercise- yes   0 caffeine drinks     FAMILY HISTORY: Family History  Problem Relation Age of Onset  . Hypertension Mother   . Asthma Mother   . Cancer Father 48       larynx cancer   . Breast cancer Maternal Aunt  60  . Colon cancer Neg Hx   . Colon polyps Neg Hx   . Esophageal cancer Neg Hx   . Rectal cancer Neg Hx   . Stomach cancer Neg Hx     ALLERGIES:  is allergic to milk-related compounds.  MEDICATIONS:  Current Outpatient Medications  Medication Sig Dispense Refill  . buPROPion (WELLBUTRIN XL) 150 MG 24 hr tablet Take 150 mg by mouth daily.    Marland Kitchen escitalopram (LEXAPRO) 20 MG tablet Take 20 mg by mouth daily.      Marland Kitchen FOLIC ACID PO Take by mouth daily.    Marland Kitchen levothyroxine (SYNTHROID) 125 MCG tablet TAKE 1 TABLET BY MOUTH EVERY DAY 30 tablet 5  . omeprazole (PRILOSEC) 20 MG capsule TAKE 1 CAPSULE BY MOUTH EVERY DAY 30 capsule 5  . OVER THE COUNTER MEDICATION     . risperiDONE (RISPERDAL) 1 MG tablet Take 1 mg by mouth at bedtime.  1  . Vitamin D, Ergocalciferol, (DRISDOL) 1.25 MG (50000 UT) CAPS capsule Take 1 capsule (50,000 Units total) by mouth every 14 (fourteen) days. For 8  weeks. Then every 2 weeks. 6 capsule 3   No current facility-administered medications for this visit.     REVIEW OF SYSTEMS:   Constitutional: Denies fevers, chills or abnormal night sweats Eyes: Denies blurriness of vision, double vision or watery eyes Ears, nose, mouth, throat, and face: Denies mucositis or sore throat Respiratory: Denies cough, dyspnea or wheezes Cardiovascular: Denies palpitation, chest discomfort or lower extremity swelling Gastrointestinal:  Denies nausea, heartburn or change in bowel habits Skin: Denies abnormal skin rashes Lymphatics: Denies new lymphadenopathy or easy bruising Neurological:Denies numbness, tingling or new weaknesses Behavioral/Psych: Mood is stable, no new changes  Breast: Denies any palpable lumps or discharge All other systems were reviewed with the patient and are negative.  PHYSICAL EXAMINATION: ECOG PERFORMANCE STATUS: 1 - Symptomatic but completely ambulatory  Vitals:   12/28/18 1345  BP: (!) 144/87  Pulse: 70  Resp: 18  Temp: 98.2 F (36.8 C)  SpO2: 100%   Filed Weights   12/28/18 1345  Weight: 215 lb 8 oz (97.8 kg)    GENERAL:alert, no distress and comfortable SKIN: skin color, texture, turgor are normal, no rashes or significant lesions EYES: normal, conjunctiva are pink and non-injected, sclera clear OROPHARYNX:no exudate, no erythema and lips, buccal mucosa, and tongue normal  NECK: supple, thyroid normal size, non-tender, without nodularity LYMPH:  no palpable lymphadenopathy in the cervical, axillary or inguinal LUNGS: clear to auscultation and percussion with normal breathing effort HEART: regular rate & rhythm and no murmurs and no lower extremity edema ABDOMEN:abdomen soft, non-tender and normal bowel sounds Musculoskeletal:no cyanosis of digits and no clubbing  PSYCH: alert & oriented x 3 with fluent speech NEURO: no focal motor/sensory deficits  LABORATORY DATA:  I have reviewed the data as listed Lab  Results  Component Value Date   WBC 2.6 (L) 11/24/2018   HGB 10.6 (L) 11/24/2018   HCT 33.6 (L) 11/24/2018   MCV 92.4 11/24/2018   PLT 266.0 11/24/2018   Lab Results  Component Value Date   NA 141 11/24/2018   K 4.0 11/24/2018   CL 107 11/24/2018   CO2 26 11/24/2018    RADIOGRAPHIC STUDIES: I have personally reviewed the radiological reports and agreed with the findings in the report.  ASSESSMENT AND PLAN:  Iron deficiency anemia Previous iron infusion: June 2018 ESA therapy: Previously received Aranesp 2-3 times per year from 2014 (with Dr. Marin Olp) Current  treatment: Retacrit 20,000 units monthly started 10/02/2018  Lab review: 11/24/2018: Hemoglobin 10.6, WBC 2.6, MCV 92.4 I discussed with her that she is also leukopenic along with anemia. 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  I do not see any need for work-up.  Since she responded very well to Retacrit, there is no need to give the injection at this time.  We will plan to give Retacrit if her hemoglobin drops below 10. She will get labs drawn today. I will see her back in 2 months with labs.   All questions were answered. The patient knows to call the clinic with any problems, questions or concerns.   Rulon Eisenmenger, MD, MPH 12/28/2018    I, Molly Dorshimer, am acting as scribe for Nicholas Lose, MD.  I have reviewed the above documentation for accuracy and completeness, and I agree with the above.

## 2018-12-28 ENCOUNTER — Inpatient Hospital Stay: Payer: Managed Care, Other (non HMO)

## 2018-12-28 ENCOUNTER — Other Ambulatory Visit: Payer: Self-pay

## 2018-12-28 ENCOUNTER — Inpatient Hospital Stay: Payer: Managed Care, Other (non HMO) | Attending: Hematology & Oncology | Admitting: Hematology and Oncology

## 2018-12-28 VITALS — BP 144/87 | HR 70 | Temp 98.2°F | Resp 18 | Ht 70.0 in | Wt 215.5 lb

## 2018-12-28 DIAGNOSIS — D500 Iron deficiency anemia secondary to blood loss (chronic): Secondary | ICD-10-CM

## 2018-12-28 DIAGNOSIS — Z808 Family history of malignant neoplasm of other organs or systems: Secondary | ICD-10-CM | POA: Diagnosis not present

## 2018-12-28 DIAGNOSIS — D631 Anemia in chronic kidney disease: Secondary | ICD-10-CM

## 2018-12-28 DIAGNOSIS — D509 Iron deficiency anemia, unspecified: Secondary | ICD-10-CM | POA: Diagnosis present

## 2018-12-28 DIAGNOSIS — Z803 Family history of malignant neoplasm of breast: Secondary | ICD-10-CM | POA: Insufficient documentation

## 2018-12-28 DIAGNOSIS — Z87891 Personal history of nicotine dependence: Secondary | ICD-10-CM | POA: Diagnosis not present

## 2018-12-28 DIAGNOSIS — D638 Anemia in other chronic diseases classified elsewhere: Secondary | ICD-10-CM

## 2018-12-28 LAB — CMP (CANCER CENTER ONLY)
ALT: 14 U/L (ref 0–44)
AST: 25 U/L (ref 15–41)
Albumin: 3.9 g/dL (ref 3.5–5.0)
Alkaline Phosphatase: 83 U/L (ref 38–126)
Anion gap: 7 (ref 5–15)
BUN: 10 mg/dL (ref 6–20)
CO2: 26 mmol/L (ref 22–32)
Calcium: 9 mg/dL (ref 8.9–10.3)
Chloride: 109 mmol/L (ref 98–111)
Creatinine: 1.12 mg/dL — ABNORMAL HIGH (ref 0.44–1.00)
GFR, Est AFR Am: >60 mL/min (ref >60)
GFR, Estimated: 54 mL/min — ABNORMAL LOW (ref >60)
Glucose, Bld: 93 mg/dL (ref 70–99)
Potassium: 4 mmol/L (ref 3.5–5.1)
Sodium: 142 mmol/L (ref 135–145)
Total Bilirubin: 0.5 mg/dL (ref 0.3–1.2)
Total Protein: 7.4 g/dL (ref 6.5–8.1)

## 2018-12-28 LAB — CBC WITH DIFFERENTIAL (CANCER CENTER ONLY)
Abs Immature Granulocytes: 0.01 10*3/uL (ref 0.00–0.07)
Basophils Absolute: 0 10*3/uL (ref 0.0–0.1)
Basophils Relative: 0 %
Eosinophils Absolute: 0 10*3/uL (ref 0.0–0.5)
Eosinophils Relative: 1 %
HCT: 30.1 % — ABNORMAL LOW (ref 36.0–46.0)
Hemoglobin: 9.3 g/dL — ABNORMAL LOW (ref 12.0–15.0)
Immature Granulocytes: 0 %
Lymphocytes Relative: 43 %
Lymphs Abs: 1.4 10*3/uL (ref 0.7–4.0)
MCH: 28.6 pg (ref 26.0–34.0)
MCHC: 30.9 g/dL (ref 30.0–36.0)
MCV: 92.6 fL (ref 80.0–100.0)
Monocytes Absolute: 0.3 10*3/uL (ref 0.1–1.0)
Monocytes Relative: 10 %
Neutro Abs: 1.5 10*3/uL — ABNORMAL LOW (ref 1.7–7.7)
Neutrophils Relative %: 46 %
Platelet Count: 208 10*3/uL (ref 150–400)
RBC: 3.25 MIL/uL — ABNORMAL LOW (ref 3.87–5.11)
RDW: 13.5 % (ref 11.5–15.5)
WBC Count: 3.2 10*3/uL — ABNORMAL LOW (ref 4.0–10.5)
nRBC: 0 % (ref 0.0–0.2)

## 2018-12-28 LAB — IRON AND TIBC
Iron: 59 ug/dL (ref 41–142)
Saturation Ratios: 26 % (ref 21–57)
TIBC: 230 ug/dL — ABNORMAL LOW (ref 236–444)
UIBC: 171 ug/dL (ref 120–384)

## 2018-12-28 LAB — RETICULOCYTES
Immature Retic Fract: 10.4 % (ref 2.3–15.9)
RBC.: 3.25 MIL/uL — ABNORMAL LOW (ref 3.87–5.11)
Retic Count, Absolute: 41 10*3/uL (ref 19.0–186.0)
Retic Ct Pct: 1.3 % (ref 0.4–3.1)

## 2018-12-28 LAB — FERRITIN: Ferritin: 314 ng/mL — ABNORMAL HIGH (ref 11–307)

## 2018-12-28 NOTE — Assessment & Plan Note (Addendum)
Previous iron infusion: June 2018 ESA therapy: Previously received Aranesp 2-3 times per year from 2014 (with Dr. Marin Olp) Current treatment: Retacrit 20,000 units monthly started 10/02/2018  Lab review: 11/24/2018: Hemoglobin 10.6, WBC 2.6, MCV 92.4 I discussed with her that she is also leukopenic along with anemia. I recommended that we perform a bone marrow biopsy.

## 2018-12-29 ENCOUNTER — Telehealth: Payer: Self-pay | Admitting: Hematology and Oncology

## 2018-12-29 NOTE — Telephone Encounter (Signed)
I left a message regarding schedule  

## 2018-12-30 ENCOUNTER — Telehealth: Payer: Self-pay

## 2018-12-30 NOTE — Telephone Encounter (Signed)
Copied from [place] [id number]. Topic: General - Inquiry >> Dec 30, 2018  2:16 PM Richardo Priest, [place] wrote: Reason for CRM: Pt called in stating she would to have a CT scan done. Pt has had a headache for a bit. Please advise and call back [phone].

## 2019-01-04 ENCOUNTER — Other Ambulatory Visit: Payer: Self-pay | Admitting: Family Medicine

## 2019-01-04 DIAGNOSIS — G4459 Other complicated headache syndrome: Secondary | ICD-10-CM

## 2019-01-04 NOTE — Telephone Encounter (Signed)
Noted. Patient is aware. Nothing further needed. 

## 2019-01-04 NOTE — Telephone Encounter (Signed)
Order for brain MRI placed. Thanks, BJ

## 2019-01-08 ENCOUNTER — Telehealth: Payer: Self-pay | Admitting: Hematology and Oncology

## 2019-01-08 NOTE — Telephone Encounter (Signed)
Returned patient's phone call regarding rescheduling 01/25 appointment, rescheduled to 01/27.

## 2019-01-25 ENCOUNTER — Other Ambulatory Visit: Payer: Self-pay | Admitting: Family Medicine

## 2019-01-25 DIAGNOSIS — R519 Headache, unspecified: Secondary | ICD-10-CM

## 2019-03-01 ENCOUNTER — Ambulatory Visit: Payer: Managed Care, Other (non HMO) | Admitting: Hematology and Oncology

## 2019-03-01 ENCOUNTER — Other Ambulatory Visit: Payer: Managed Care, Other (non HMO)

## 2019-03-01 ENCOUNTER — Ambulatory Visit: Payer: Managed Care, Other (non HMO)

## 2019-03-02 NOTE — Progress Notes (Signed)
Patient Care Team: [name], [name], MD as PCP - General (Family Medicine)  DIAGNOSIS:    ICD-10-CM   1. Iron deficiency anemia due to chronic blood loss  D50.0     CHIEF COMPLIANT: Follow-up of anemia  INTERVAL HISTORY: Karen [name] is a 59 y.o. with above-mentioned history of anemia previously treated with Retacrit. She presents to the clinic today for follow-up.  She reports no new problems or concerns.  Denies any major symptoms related to anemia.  ALLERGIES:  is allergic to milk-related compounds.  MEDICATIONS:  Current Outpatient Medications  Medication Sig Dispense Refill  . buPROPion (WELLBUTRIN XL) 150 MG 24 hr tablet Take 150 mg by mouth daily.    Marland Kitchen escitalopram (LEXAPRO) 20 MG tablet Take 20 mg by mouth daily.      Marland Kitchen FOLIC ACID PO Take by mouth daily.    Marland Kitchen levothyroxine (SYNTHROID) 125 MCG tablet TAKE 1 TABLET BY MOUTH EVERY DAY 30 tablet 5  . omeprazole (PRILOSEC) 20 MG capsule TAKE 1 CAPSULE BY MOUTH EVERY DAY 30 capsule 5  . OVER THE COUNTER MEDICATION     . risperiDONE (RISPERDAL) 1 MG tablet Take 1 mg by mouth at bedtime.  1  . Vitamin D, Ergocalciferol, (DRISDOL) 1.25 MG (50000 UT) CAPS capsule Take 1 capsule (50,000 Units total) by mouth every 14 (fourteen) days. For 8 weeks. Then every 2 weeks. 6 capsule 3   No current facility-administered medications for this visit.    PHYSICAL EXAMINATION: ECOG PERFORMANCE STATUS: 1 - Symptomatic but completely ambulatory  Vitals:   03/03/19 1125  BP: 122/64  Pulse: 65  Resp: 18  Temp: (!) 97.4 F (36.3 C)  SpO2: 99%   Filed Weights   03/03/19 1125  Weight: 208 lb 3.2 oz (94.4 kg)    LABORATORY DATA:  I have reviewed the data as listed CMP Latest Ref Rng & Units 12/28/2018 11/24/2018 11/13/2018  Glucose 70 - 99 mg/dL 93 100(H) 79  BUN 6 - 20 mg/dL 10 15 10   Creatinine 0.44 - 1.00 mg/dL 1.12(H) 1.08 1.14(H)  Sodium 135 - 145 mmol/L 142 141 136  Potassium 3.5 - 5.1 mmol/L 4.0 4.0 3.5  Chloride 98 - 111  mmol/L 109 107 104  CO2 22 - 32 mmol/L 26 26 24   Calcium 8.9 - 10.3 mg/dL 9.0 9.4 9.4  Total Protein 6.5 - 8.1 g/dL 7.4 7.4 7.4  Total Bilirubin 0.3 - 1.2 mg/dL 0.5 0.7 0.4  Alkaline Phos 38 - 126 U/L 83 80 81  AST 15 - 41 U/L 25 19 19   ALT 0 - 44 U/L 14 12 12     Lab Results  Component Value Date   WBC 2.9 (L) 03/03/2019   HGB 9.2 (L) 03/03/2019   HCT 30.2 (L) 03/03/2019   MCV 93.5 03/03/2019   PLT 218 03/03/2019   NEUTROABS 1.5 (L) 03/03/2019    ASSESSMENT & PLAN:  Iron deficiency anemia Previous iron infusion: June 2018 ESA therapy: Previously received Aranesp 2-3 times per year from 2014 (with Dr. Marin Olp) Current treatment: Retacrit 20,000 units monthly started 10/02/2018 (2 doses received so far) goal: Hemoglobin of 10 or above  Lab review:   03/03/2019: Hemoglobin 9.2, ANC 1.5, WBC 2.9 Mild leukopenia: Cyclical Patient will come every other month for lab checks and Retacrit injections. Return to clinic in 6 months with labs and follow-up    No orders of the defined types were placed in this encounter.  The patient has a good understanding of  the overall plan. she agrees with it. she will call with any problems that may develop before the next visit here.  Total time spent: 20 mins including face to face time and time spent for planning, charting and coordination of care  Nicholas Lose, MD 03/03/2019  I, Cloyde Reams Dorshimer, am acting as scribe for Dr. Nicholas Lose.  I have reviewed the above documentation for accuracy and completeness, and I agree with the above.

## 2019-03-03 ENCOUNTER — Inpatient Hospital Stay (HOSPITAL_BASED_OUTPATIENT_CLINIC_OR_DEPARTMENT_OTHER): Payer: Managed Care, Other (non HMO) | Admitting: Hematology and Oncology

## 2019-03-03 ENCOUNTER — Inpatient Hospital Stay: Payer: Managed Care, Other (non HMO) | Attending: Hematology & Oncology

## 2019-03-03 ENCOUNTER — Inpatient Hospital Stay: Payer: Managed Care, Other (non HMO)

## 2019-03-03 ENCOUNTER — Other Ambulatory Visit: Payer: Self-pay

## 2019-03-03 DIAGNOSIS — D500 Iron deficiency anemia secondary to blood loss (chronic): Secondary | ICD-10-CM

## 2019-03-03 DIAGNOSIS — N189 Chronic kidney disease, unspecified: Secondary | ICD-10-CM | POA: Insufficient documentation

## 2019-03-03 DIAGNOSIS — D631 Anemia in chronic kidney disease: Secondary | ICD-10-CM | POA: Diagnosis present

## 2019-03-03 DIAGNOSIS — D638 Anemia in other chronic diseases classified elsewhere: Secondary | ICD-10-CM

## 2019-03-03 LAB — CBC WITH DIFFERENTIAL (CANCER CENTER ONLY)
Abs Immature Granulocytes: 0.01 10*3/uL (ref 0.00–0.07)
Basophils Absolute: 0 10*3/uL (ref 0.0–0.1)
Basophils Relative: 0 %
Eosinophils Absolute: 0.1 10*3/uL (ref 0.0–0.5)
Eosinophils Relative: 2 %
HCT: 30.2 % — ABNORMAL LOW (ref 36.0–46.0)
Hemoglobin: 9.2 g/dL — ABNORMAL LOW (ref 12.0–15.0)
Immature Granulocytes: 0 %
Lymphocytes Relative: 36 %
Lymphs Abs: 1.1 10*3/uL (ref 0.7–4.0)
MCH: 28.5 pg (ref 26.0–34.0)
MCHC: 30.5 g/dL (ref 30.0–36.0)
MCV: 93.5 fL (ref 80.0–100.0)
Monocytes Absolute: 0.3 10*3/uL (ref 0.1–1.0)
Monocytes Relative: 11 %
Neutro Abs: 1.5 10*3/uL — ABNORMAL LOW (ref 1.7–7.7)
Neutrophils Relative %: 51 %
Platelet Count: 218 10*3/uL (ref 150–400)
RBC: 3.23 MIL/uL — ABNORMAL LOW (ref 3.87–5.11)
RDW: 12.8 % (ref 11.5–15.5)
WBC Count: 2.9 10*3/uL — ABNORMAL LOW (ref 4.0–10.5)
nRBC: 0 % (ref 0.0–0.2)

## 2019-03-03 MED ORDER — EPOETIN ALFA-EPBX 10000 UNIT/ML IJ SOLN
INTRAMUSCULAR | Status: AC
  Filled 2019-03-03: qty 2

## 2019-03-03 MED ORDER — EPOETIN ALFA-EPBX 10000 UNIT/ML IJ SOLN
20000.0000 [IU] | Freq: Once | INTRAMUSCULAR | Status: AC
  Administered 2019-03-03: 20000 [IU] via SUBCUTANEOUS

## 2019-03-03 NOTE — Assessment & Plan Note (Signed)
Previous iron infusion: June 2018 ESA therapy: Previously received Aranesp 2-3 times per year from 2014 (with Dr. Marin Olp) Current treatment: Retacrit 20,000 units monthly started 10/02/2018 (2 doses received so far) goal: Hemoglobin of 10 or above  Lab review:   03/03/2019: Mild leukopenia: Cyclical Patient will come every other month for lab checks and Retacrit injections. Return to clinic in 6 months with labs and follow-up

## 2019-03-03 NOTE — Patient Instructions (Signed)

## 2019-03-04 ENCOUNTER — Telehealth: Payer: Self-pay | Admitting: Hematology and Oncology

## 2019-03-04 NOTE — Telephone Encounter (Signed)
I talk with patient regarding schedule  

## 2019-04-03 ENCOUNTER — Ambulatory Visit: Payer: Managed Care, Other (non HMO) | Attending: Internal Medicine

## 2019-04-03 DIAGNOSIS — Z23 Encounter for immunization: Secondary | ICD-10-CM | POA: Insufficient documentation

## 2019-04-03 NOTE — Progress Notes (Signed)
   Covid-19 Vaccination Clinic  Name:  Karen [name]    MRN: [id number] DOB: 23-Jan-1961  04/03/2019  Karen [name] was observed post Covid-19 immunization for 15 minutes without incidence. She was provided with Vaccine Information Sheet and instruction to access the V-Safe system.   Karen [name] was instructed to call 911 with any severe reactions post vaccine: Marland Kitchen Difficulty breathing  . Swelling of your face and throat  . A fast heartbeat  . A bad rash all over your body  . Dizziness and weakness    Immunizations Administered    Name Date Dose VIS Date Route   Pfizer COVID-19 Vaccine 04/03/2019  6:22 PM 0.3 mL 01/15/2019 Intramuscular   Manufacturer: [place]   Lot: [id number]   [place]: [id number]

## 2019-04-24 ENCOUNTER — Ambulatory Visit: Payer: Managed Care, Other (non HMO) | Attending: Internal Medicine

## 2019-04-24 DIAGNOSIS — Z23 Encounter for immunization: Secondary | ICD-10-CM

## 2019-04-24 NOTE — Progress Notes (Signed)
   Covid-19 Vaccination Clinic  Name:  Karen [name]    MRN: [id number] DOB: [birth year]  04/24/2019  [name] was observed post Covid-19 immunization for 15 minutes without incident. She was provided with Vaccine Information Sheet and instruction to access the V-Safe system.   [name] was instructed to call 911 with any severe reactions post vaccine: Marland Kitchen Difficulty breathing  . Swelling of face and throat  . A fast heartbeat  . A bad rash all over body  . Dizziness and weakness   Immunizations Administered    Name Date Dose VIS Date Route   Pfizer COVID-19 Vaccine 04/24/2019 11:54 AM 0.3 mL 01/15/2019 Intramuscular   Manufacturer: [place]   Lot: [id number]   [place]: [id number]

## 2019-05-02 ENCOUNTER — Other Ambulatory Visit: Payer: Self-pay | Admitting: Family Medicine

## 2019-05-03 ENCOUNTER — Other Ambulatory Visit: Payer: Self-pay

## 2019-05-03 ENCOUNTER — Inpatient Hospital Stay: Payer: Managed Care, Other (non HMO) | Attending: Hematology & Oncology

## 2019-05-03 ENCOUNTER — Inpatient Hospital Stay: Payer: Managed Care, Other (non HMO)

## 2019-05-03 VITALS — BP 128/62 | HR 62 | Temp 98.2°F | Resp 18

## 2019-05-03 DIAGNOSIS — D631 Anemia in chronic kidney disease: Secondary | ICD-10-CM

## 2019-05-03 DIAGNOSIS — D500 Iron deficiency anemia secondary to blood loss (chronic): Secondary | ICD-10-CM

## 2019-05-03 DIAGNOSIS — N189 Chronic kidney disease, unspecified: Secondary | ICD-10-CM | POA: Insufficient documentation

## 2019-05-03 LAB — CBC WITH DIFFERENTIAL (CANCER CENTER ONLY)
Abs Immature Granulocytes: 0.02 10*3/uL (ref 0.00–0.07)
Basophils Absolute: 0 10*3/uL (ref 0.0–0.1)
Basophils Relative: 0 %
Eosinophils Absolute: 0 10*3/uL (ref 0.0–0.5)
Eosinophils Relative: 1 %
HCT: 31.7 % — ABNORMAL LOW (ref 36.0–46.0)
Hemoglobin: 9.7 g/dL — ABNORMAL LOW (ref 12.0–15.0)
Immature Granulocytes: 1 %
Lymphocytes Relative: 31 %
Lymphs Abs: 0.9 10*3/uL (ref 0.7–4.0)
MCH: 28.7 pg (ref 26.0–34.0)
MCHC: 30.6 g/dL (ref 30.0–36.0)
MCV: 93.8 fL (ref 80.0–100.0)
Monocytes Absolute: 0.3 10*3/uL (ref 0.1–1.0)
Monocytes Relative: 9 %
Neutro Abs: 1.7 10*3/uL (ref 1.7–7.7)
Neutrophils Relative %: 58 %
Platelet Count: 221 10*3/uL (ref 150–400)
RBC: 3.38 MIL/uL — ABNORMAL LOW (ref 3.87–5.11)
RDW: 12.8 % (ref 11.5–15.5)
WBC Count: 3 10*3/uL — ABNORMAL LOW (ref 4.0–10.5)
nRBC: 0 % (ref 0.0–0.2)

## 2019-05-03 LAB — COMPREHENSIVE METABOLIC PANEL
ALT: 12 U/L (ref 0–44)
AST: 19 U/L (ref 15–41)
Albumin: 3.9 g/dL (ref 3.5–5.0)
Alkaline Phosphatase: 84 U/L (ref 38–126)
Anion gap: 8 (ref 5–15)
BUN: 11 mg/dL (ref 6–20)
CO2: 26 mmol/L (ref 22–32)
Calcium: 9.4 mg/dL (ref 8.9–10.3)
Chloride: 108 mmol/L (ref 98–111)
Creatinine, Ser: 1.13 mg/dL — ABNORMAL HIGH (ref 0.44–1.00)
GFR calc Af Amer: >60 mL/min (ref >60)
GFR calc non Af Amer: 53 mL/min — ABNORMAL LOW (ref >60)
Glucose, Bld: 86 mg/dL (ref 70–99)
Potassium: 4 mmol/L (ref 3.5–5.1)
Sodium: 142 mmol/L (ref 135–145)
Total Bilirubin: 0.5 mg/dL (ref 0.3–1.2)
Total Protein: 7.5 g/dL (ref 6.5–8.1)

## 2019-05-03 LAB — FERRITIN: Ferritin: 288 ng/mL (ref 11–307)

## 2019-05-03 LAB — IRON AND TIBC
Iron: 69 ug/dL (ref 41–142)
Saturation Ratios: 29 % (ref 21–57)
TIBC: 235 ug/dL — ABNORMAL LOW (ref 236–444)
UIBC: 166 ug/dL (ref 120–384)

## 2019-05-03 MED ORDER — EPOETIN ALFA-EPBX 10000 UNIT/ML IJ SOLN
INTRAMUSCULAR | Status: AC
  Filled 2019-05-03: qty 2

## 2019-05-03 MED ORDER — EPOETIN ALFA-EPBX 10000 UNIT/ML IJ SOLN
20000.0000 [IU] | Freq: Once | INTRAMUSCULAR | Status: AC
  Administered 2019-05-03: 20000 [IU] via SUBCUTANEOUS

## 2019-05-03 NOTE — Telephone Encounter (Signed)
SENT TO THE PHARMACY BY E-SCRIBE. 

## 2019-05-03 NOTE — Patient Instructions (Signed)

## 2019-05-14 ENCOUNTER — Telehealth: Payer: Self-pay | Admitting: Family Medicine

## 2019-05-14 NOTE — Telephone Encounter (Signed)
If new onset of LLE edema, we need to be sure it is not DVT.  Recommend ER/acute care evaluation. Thanks, BJ

## 2019-05-14 NOTE — Telephone Encounter (Signed)
Please advise 

## 2019-05-14 NOTE — Telephone Encounter (Signed)
Patient informed and verbalized understanding. Patient agreed to go to ER tonight. Patient has appointment scheduled for 05/19/19.

## 2019-05-14 NOTE — Telephone Encounter (Signed)
Pt left leg is swollen and she would like advice on what she can do for the swelling.

## 2019-05-18 ENCOUNTER — Other Ambulatory Visit: Payer: Self-pay

## 2019-05-18 ENCOUNTER — Encounter: Payer: Self-pay | Admitting: Family Medicine

## 2019-05-18 ENCOUNTER — Ambulatory Visit (INDEPENDENT_AMBULATORY_CARE_PROVIDER_SITE_OTHER): Payer: Managed Care, Other (non HMO) | Admitting: Family Medicine

## 2019-05-18 VITALS — BP 152/98 | HR 96 | Temp 95.5°F | Resp 12 | Ht 70.0 in | Wt 203.6 lb

## 2019-05-18 DIAGNOSIS — R519 Headache, unspecified: Secondary | ICD-10-CM | POA: Diagnosis not present

## 2019-05-18 DIAGNOSIS — S8012XD Contusion of left lower leg, subsequent encounter: Secondary | ICD-10-CM | POA: Diagnosis not present

## 2019-05-18 DIAGNOSIS — R252 Cramp and spasm: Secondary | ICD-10-CM

## 2019-05-18 DIAGNOSIS — N1831 Chronic kidney disease, stage 3a: Secondary | ICD-10-CM

## 2019-05-18 DIAGNOSIS — I10 Essential (primary) hypertension: Secondary | ICD-10-CM | POA: Diagnosis not present

## 2019-05-18 LAB — BASIC METABOLIC PANEL
BUN: 12 mg/dL (ref 6–23)
CO2: 27 mEq/L (ref 19–32)
Calcium: 9.1 mg/dL (ref 8.4–10.5)
Chloride: 101 mEq/L (ref 96–112)
Creatinine, Ser: 0.97 mg/dL (ref 0.40–1.20)
GFR: 70.88 mL/min (ref >60.00)
Glucose, Bld: 89 mg/dL (ref 70–99)
Potassium: 4.1 mEq/L (ref 3.5–5.1)
Sodium: 134 mEq/L — ABNORMAL LOW (ref 135–145)

## 2019-05-18 MED ORDER — AMLODIPINE BESYLATE 5 MG PO TABS: 2.5000 mg | ORAL_TABLET | Freq: Every day | ORAL | 1 refills | Status: DC

## 2019-05-18 NOTE — Progress Notes (Signed)
Chief Complaint  Patient presents with  . Hospitalization Follow-up    Swollen left leg and bruised, inner left thigh, had a bad charlie horse mucsle cramp  . Edema    swollen in left ankle into entire leg    HPI:  [name] is a 60 y.o. female, who is here today to follow on recent ER visit. She started with LLE edema that started on 05/12/19. She had a fall 05/12/19, no serious injury. Landed on left side,no head trauma.  Later she had a severe cramp in left tight that lasted a few minutes. Next day thigh edema was better but developed bruises in same area.  Sore like pain, better today. 5/10, it was 7-8/10. Worse at the end of the day after long standing/walking. Negative for fever, CP,palpitations,or SOB.  She was evaluated in the ER on 05/14/19, LE venous US negative for DVT.   BP was elevated during ER visit, 176/94. No Hx of HTN but BP has been elevated a few times in the past. + CKD III and chronic anemia. She has not noted gross hematuria,decreaed urine output,or foam in urine.  Mild dull headache, 5/10, no associated visual changes ,nausea,or vomiting. She has had intermittent episodes of headache,sometiems severe. Not sure about exacerbating or alleviating factors.  Brain MRI was not approved by her health insurance, so she was referred to neurologist. She did not accept appt and was supposed to call back to when "ready." No new associated symptoms.  Lab Results  Component Value Date   TSH 0.51 11/24/2018    Lab Results  Component Value Date   CREATININE 1.13 (H) 05/03/2019   BUN 11 05/03/2019   NA 142 05/03/2019   K 4.0 05/03/2019   CL 108 05/03/2019   CO2 26 05/03/2019    Review of Systems  Constitutional: Positive for fatigue. Negative for activity change, appetite change and fever.  HENT: Negative for mouth sores, nosebleeds and sore throat.   Eyes: Negative for pain and redness.  Respiratory: Negative for cough and wheezing.     Gastrointestinal: Negative for abdominal pain.       Negative for changes in bowel habits.  Endocrine: Negative for cold intolerance and heat intolerance.  Genitourinary: Negative for dysuria.  Musculoskeletal: Positive for myalgias. Negative for gait problem.  Skin: Negative for wound.  Neurological: Negative for syncope, facial asymmetry and weakness.  Psychiatric/Behavioral: Negative for confusion. The patient is nervous/anxious.   Rest see pertinent positives and negatives per HPI.   Current Outpatient Medications on File Prior to Visit  Medication Sig Dispense Refill  . buPROPion (WELLBUTRIN XL) 150 MG 24 hr tablet Take 150 mg by mouth daily.    Marland Kitchen escitalopram (LEXAPRO) 20 MG tablet Take 20 mg by mouth daily.      Marland Kitchen FOLIC ACID PO Take by mouth daily.    Marland Kitchen levothyroxine (SYNTHROID) 125 MCG tablet TAKE 1 TABLET BY MOUTH EVERY DAY 30 tablet 5  . omeprazole (PRILOSEC) 20 MG capsule TAKE 1 CAPSULE BY MOUTH EVERY DAY 30 capsule 5  . risperiDONE (RISPERDAL) 1 MG tablet Take 1 mg by mouth at bedtime.  1  . Vitamin D, Ergocalciferol, (DRISDOL) 1.25 MG (50000 UT) CAPS capsule Take 1 capsule (50,000 Units total) by mouth every 14 (fourteen) days. For 8 weeks. Then every 2 weeks. 6 capsule 3  . OVER THE COUNTER MEDICATION      No current facility-administered medications on file prior to visit.  Past Medical History:  Diagnosis Date  . Anemia   . Barrett esophagus   . Depression   . DUB (dysfunctional uterine bleeding)   . GERD (gastroesophageal reflux disease)   . Graves disease   . Hypothyroidism    Allergies  Allergen Reactions  . Milk-Related Compounds Other (See Comments)    Intolerance to milk products-causes gas    Social History   Socioeconomic History  . Marital status: Married    Spouse name: Not on file  . Number of children: 0  . Years of education: Not on file  . Highest education level: Not on file  Occupational History    Employer: [employer]  Tobacco Use   . Smoking status: Former Smoker    Packs/day: 1.00    Years: 20.00    Pack years: 20.00    Types: Cigarettes    Start date: 03/13/1967    Quit date: 07/14/1987    Years since quitting: 31.8  . Smokeless tobacco: Never Used  . Tobacco comment: quit 26 years ago  Substance and Sexual Activity  . Alcohol use: No    Alcohol/week: 0.0 standard drinks  . Drug use: No  . Sexual activity: Not on file  Other Topics Concern  . Not on file  Social History Narrative   Occupation: Freight forwarder   Regular exercise- yes   0 caffeine drinks    Social Determinants of Radio broadcast assistant Strain:   . Difficulty of Paying Living Expenses:   Food Insecurity:   . Worried About Charity fundraiser in the Last Year:   . Arboriculturist in the Last Year:   Transportation Needs:   . Film/video editor (Medical):   Marland Kitchen Lack of Transportation (Non-Medical):   Physical Activity:   . Days of Exercise per Week:   . Minutes of Exercise per Session:   Stress:   . Feeling of Stress :   Social Connections:   . Frequency of Communication with Friends and Family:   . Frequency of Social Gatherings with Friends and Family:   . Attends Religious Services:   . Active Member of Clubs or Organizations:   . Attends Archivist Meetings:   Marland Kitchen Marital Status:     Vitals:   05/18/19 1404  BP: (!) 152/98  Pulse: 96  Resp: 12  Temp: (!) 95.5 F (35.3 C)  SpO2: 94%   Body mass index is 29.21 kg/m.  Physical Exam  Nursing note and vitals reviewed. Constitutional: She is oriented to person, place, and time. She appears well-developed. No distress.  HENT:  Head: Normocephalic and atraumatic.  Mouth/Throat: Oropharynx is clear and moist and mucous membranes are normal.  Eyes: Pupils are equal, round, and reactive to light. Conjunctivae are normal.  Cardiovascular: Normal rate and regular rhythm.  No murmur heard. Pulses:      Dorsalis pedis pulses are 2+ on the right side and 2+ on  the left side.  Respiratory: Effort normal and breath sounds normal. No respiratory distress.  GI: Soft. She exhibits no mass. There is no hepatomegaly. There is no abdominal tenderness.  Musculoskeletal:        General: Edema (Trace pitting LE edema,bilateral) present.  Lymphadenopathy:    She has no cervical adenopathy.  Neurological: She is alert and oriented to person, place, and time. She has normal strength. No cranial nerve deficit. Gait normal.  Skin: Skin is warm. Ecchymosis noted. No rash noted. No erythema.  Psychiatric: Her mood appears anxious.  Well groomed, good eye contact.    ASSESSMENT AND PLAN:  Ms. Shaneaka was seen today for hospitalization follow-up and edema.  Diagnoses and all orders for this visit: Orders Placed This Encounter  Procedures  . Basic metabolic panel   Lab Results  Component Value Date   CREATININE 0.97 05/18/2019   BUN 12 05/18/2019   NA 134 (L) 05/18/2019   K 4.1 05/18/2019   CL 101 05/18/2019   CO2 27 05/18/2019    Traumatic ecchymosis of left lower leg, subsequent encounter Improving. Monitor for new symptoms. LE elevation. Recommend being cautious with BC powder for pain management, Tylenol is a better options.  Headache, unspecified headache type Stable. She was on Nortriptyline, discontinued because she did not feel like it was helping. Could also be caused by HTN. Instructed about warning signs.  Hypertension, essential, benign Re-checked: 158/96. Possible complications of elevated BP discussed. Low salt/DASH diet recommended. Amlodipine 2.5 mg daily, she was instructed to increase dose in 2-3 weeks if BP still 140/90 or higher. Some side effects discussed. Clearly instructed about warning signs.  -     amLODipine (NORVASC) 5 MG tablet; Take 0.5 tablets (2.5 mg total) by mouth daily.  Stage 3a chronic kidney disease Adequate BP controlled and hydration. Low salt diet. Avoid NSAID's.  Leg cramp Possible  etiologies discussed. ? Dehydration,electrolyte abnormality among some to consider. Instructed about warning signs.   Return in about 2 months (around 07/18/2019).    Aryn Safran G. [name], MD  [facility] [place] office.

## 2019-05-18 NOTE — Patient Instructions (Addendum)
A few things to remember from today's visit:   Consider arranging appointment with neurologist. Caution with BC powder and Ibuprofen. Amlodipine added today,start with 1/2 tab and monitor BP at home . If blood pressure still at 140/90 or higher increase me to whole tablet.  Low salt diet.  DASH Eating Plan DASH stands for "Dietary Approaches to Stop Hypertension." The DASH eating plan is a healthy eating plan that has been shown to reduce high blood pressure (hypertension). It may also reduce your risk for type 2 diabetes, heart disease, and stroke. The DASH eating plan may also help with weight loss. What are tips for following this plan?  General guidelines  Avoid eating more than 2,300 mg (milligrams) of salt (sodium) a day. If you have hypertension, you may need to reduce your sodium intake to 1,500 mg a day.  Limit alcohol intake to no more than 1 drink a day for nonpregnant women and 2 drinks a day for men. One drink equals 12 oz of beer, 5 oz of wine, or 1 oz of hard liquor.  Work with your health care provider to maintain a healthy body weight or to lose weight. Ask what an ideal weight is for you.  Get at least 30 minutes of exercise that causes your heart to beat faster (aerobic exercise) most days of the week. Activities may include walking, swimming, or biking.  Work with your health care provider or diet and nutrition specialist (dietitian) to adjust your eating plan to your individual calorie needs. Reading food labels   Check food labels for the amount of sodium per serving. Choose foods with less than 5 percent of the Daily Value of sodium. Generally, foods with less than 300 mg of sodium per serving fit into this eating plan.  To find whole grains, look for the word "whole" as the first word in the ingredient list. Shopping  Buy products labeled as "low-sodium" or "no salt added."  Buy fresh foods. Avoid canned foods and premade or frozen meals. Cooking  Avoid  adding salt when cooking. Use salt-free seasonings or herbs instead of table salt or sea salt. Check with your health care provider or pharmacist before using salt substitutes.  Do not fry foods. Cook foods using healthy methods such as baking, boiling, grilling, and broiling instead.  Cook with heart-healthy oils, such as olive, canola, soybean, or sunflower oil. Meal planning  Eat a balanced diet that includes: ? 5 or more servings of fruits and vegetables each day. At each meal, try to fill half of your plate with fruits and vegetables. ? Up to 6-8 servings of whole grains each day. ? Less than 6 oz of lean meat, poultry, or fish each day. A 3-oz serving of meat is about the same size as a deck of cards. One egg equals 1 oz. ? 2 servings of low-fat dairy each day. ? A serving of nuts, seeds, or beans 5 times each week. ? Heart-healthy fats. Healthy fats called Omega-3 fatty acids are found in foods such as flaxseeds and coldwater fish, like sardines, salmon, and mackerel.  Limit how much you eat of the following: ? Canned or prepackaged foods. ? Food that is high in trans fat, such as fried foods. ? Food that is high in saturated fat, such as fatty meat. ? Sweets, desserts, sugary drinks, and other foods with added sugar. ? Full-fat dairy products.  Do not salt foods before eating.  Try to eat at least 2 vegetarian meals each  week.  Eat more home-cooked food and less restaurant, buffet, and fast food.  When eating at a restaurant, ask that your food be prepared with less salt or no salt, if possible. What foods are recommended? The items listed may not be a complete list. Talk with your dietitian about what dietary choices are best for you. Grains Whole-grain or whole-wheat bread. Whole-grain or whole-wheat pasta. Brown rice. [place]. Bulgur. Whole-grain and low-sodium cereals. Pita bread. Low-fat, low-sodium crackers. Whole-wheat flour tortillas. Vegetables Fresh or  frozen vegetables (raw, steamed, roasted, or grilled). Low-sodium or reduced-sodium tomato and vegetable juice. Low-sodium or reduced-sodium tomato sauce and tomato paste. Low-sodium or reduced-sodium canned vegetables. Fruits All fresh, dried, or frozen fruit. Canned fruit in natural juice (without added sugar). Meat and other protein foods Skinless chicken or [place]. Ground chicken or [place]. Pork with fat trimmed off. Fish and seafood. Egg whites. Dried beans, peas, or lentils. Unsalted nuts, nut butters, and seeds. Unsalted canned beans. Lean cuts of beef with fat trimmed off. Low-sodium, lean deli meat. Dairy Low-fat (1%) or fat-free (skim) milk. Fat-free, low-fat, or reduced-fat cheeses. Nonfat, low-sodium ricotta or cottage cheese. Low-fat or nonfat yogurt. Low-fat, low-sodium cheese. Fats and oils Soft margarine without trans fats. Vegetable oil. Low-fat, reduced-fat, or light mayonnaise and salad dressings (reduced-sodium). Canola, safflower, olive, soybean, and sunflower oils. Avocado. Seasoning and other foods Herbs. Spices. Seasoning mixes without salt. Unsalted popcorn and pretzels. Fat-free sweets. What foods are not recommended? The items listed may not be a complete list. Talk with your dietitian about what dietary choices are best for you. Grains Baked goods made with fat, such as croissants, muffins, or some breads. Dry pasta or rice meal packs. Vegetables Creamed or fried vegetables. Vegetables in a cheese sauce. Regular canned vegetables (not low-sodium or reduced-sodium). Regular canned tomato sauce and paste (not low-sodium or reduced-sodium). Regular tomato and vegetable juice (not low-sodium or reduced-sodium). Angie Fava. Olives. Fruits Canned fruit in a light or heavy syrup. Fried fruit. Fruit in cream or butter sauce. Meat and other protein foods Fatty cuts of meat. Ribs. Fried meat. Berniece Salines. Sausage. Bologna and other processed lunch meats. Salami. Fatback. Hotdogs.  Bratwurst. Salted nuts and seeds. Canned beans with added salt. Canned or smoked fish. Whole eggs or egg yolks. Chicken or [place] with skin. Dairy Whole or 2% milk, cream, and half-and-half. Whole or full-fat cream cheese. Whole-fat or sweetened yogurt. Full-fat cheese. Nondairy creamers. Whipped toppings. Processed cheese and cheese spreads. Fats and oils Butter. Stick margarine. Lard. Shortening. Ghee. Bacon fat. Tropical oils, such as coconut, palm kernel, or palm oil. Seasoning and other foods Salted popcorn and pretzels. Onion salt, garlic salt, seasoned salt, table salt, and sea salt. Worcestershire sauce. Tartar sauce. Barbecue sauce. Teriyaki sauce. Soy sauce, including reduced-sodium. Steak sauce. Canned and packaged gravies. Fish sauce. Oyster sauce. Cocktail sauce. Horseradish that you find on the shelf. Ketchup. Mustard. Meat flavorings and tenderizers. Bouillon cubes. Hot sauce and Tabasco sauce. Premade or packaged marinades. Premade or packaged taco seasonings. Relishes. Regular salad dressings. Where to find more information:  National Heart, Lung, and [place]: https://wilson-eaton.com/  American Heart Association: www.heart.org Summary  The DASH eating plan is a healthy eating plan that has been shown to reduce high blood pressure (hypertension). It may also reduce your risk for type 2 diabetes, heart disease, and stroke.  With the DASH eating plan, you should limit salt (sodium) intake to 2,300 mg a day. If you have hypertension, you may need to reduce your  sodium intake to 1,500 mg a day.  When on the DASH eating plan, aim to eat more fresh fruits and vegetables, whole grains, lean proteins, low-fat dairy, and heart-healthy fats.  Work with your health care provider or diet and nutrition specialist (dietitian) to adjust your eating plan to your individual calorie needs. This information is not intended to replace advice given to you by your health care provider. Make sure you  discuss any questions you have with your health care provider. Document Revised: 01/03/2017 Document Reviewed: 01/15/2016 Elsevier Patient Education  [place].   Please be sure medication list is accurate. If a new problem present, please set up appointment sooner than planned today.

## 2019-05-19 ENCOUNTER — Ambulatory Visit: Payer: Managed Care, Other (non HMO) | Admitting: Family Medicine

## 2019-05-20 ENCOUNTER — Encounter: Payer: Self-pay | Admitting: Family Medicine

## 2019-06-09 ENCOUNTER — Telehealth: Payer: Self-pay | Admitting: Family Medicine

## 2019-06-09 ENCOUNTER — Ambulatory Visit (INDEPENDENT_AMBULATORY_CARE_PROVIDER_SITE_OTHER): Payer: Managed Care, Other (non HMO) | Admitting: Family Medicine

## 2019-06-09 ENCOUNTER — Other Ambulatory Visit: Payer: Self-pay

## 2019-06-09 ENCOUNTER — Encounter: Payer: Self-pay | Admitting: Family Medicine

## 2019-06-09 VITALS — BP 120/80 | HR 63 | Temp 97.7°F | Resp 16 | Ht 70.0 in | Wt 199.4 lb

## 2019-06-09 DIAGNOSIS — I10 Essential (primary) hypertension: Secondary | ICD-10-CM | POA: Diagnosis not present

## 2019-06-09 DIAGNOSIS — S8012XD Contusion of left lower leg, subsequent encounter: Secondary | ICD-10-CM | POA: Diagnosis not present

## 2019-06-09 DIAGNOSIS — R252 Cramp and spasm: Secondary | ICD-10-CM

## 2019-06-09 NOTE — Progress Notes (Signed)
ACUTE VISIT     Chief Complaint  Patient presents with  . Bruises    brusies on inner part of arm, tender to the touch, come and go, started a few months ago   HPI: [name] is a 59 y.o. female, who is here today with above concern. Easy bruising for a few months, sometimes with no known hx of trauma, mildly tender.  A couple days ago she noted ecchymotic lesions on arms. Occasionally nasal bleed, blood on tissue when cleaning nose. Negative for gum bleeding,melena,blood in stool,or gross hematuria. She has not noted fever,chills,night sweats,or abnormal wt loss.  She has history of chronic anemia, she follows with hematologist.  Lab Results  Component Value Date   WBC 3.0 (L) 05/03/2019   HGB 9.7 (L) 05/03/2019   HCT 31.7 (L) 05/03/2019   MCV 93.8 05/03/2019   PLT 221 05/03/2019    Lab Results  Component Value Date   TSH 0.51 11/24/2018   Last visit, 05/18/19, Amlodipine 2.5 mg was started for HTN. Home BP's 110's/70's. She is tolerating medication well. Negative for severe/frequent headache, visual changes, chest pain, dyspnea, palpitation, focal weakness, or edema.  Still having LE cramps, not as intense. Started K+ supplementation 2 weeks ago, hoping this will help.  Lab Results  Component Value Date   CREATININE 0.97 05/18/2019   BUN 12 05/18/2019   NA 134 (L) 05/18/2019   K 4.1 05/18/2019   CL 101 05/18/2019   CO2 27 05/18/2019    She has not noted LE edema,erythema,or skin rash. She has not identified exacerbating or alleviating factors.  Review of Systems  Constitutional: Negative for activity change, appetite change and fatigue.  HENT: Negative for mouth sores and sore throat.   Respiratory: Negative for cough and wheezing.   Gastrointestinal: Negative for abdominal pain, nausea and vomiting.       Negative for changes in bowel habits.  Endocrine: Negative for cold intolerance and heat intolerance.  Genitourinary: Negative for  decreased urine volume and dysuria.  Neurological: Negative for syncope, facial asymmetry and weakness.  Psychiatric/Behavioral: Negative for confusion. The patient is nervous/anxious.   Rest see pertinent positives and negatives per HPI.  Current Outpatient Medications on File Prior to Visit  Medication Sig Dispense Refill  . amLODipine (NORVASC) 5 MG tablet Take 0.5 tablets (2.5 mg total) by mouth daily. 45 tablet 1  . buPROPion (WELLBUTRIN XL) 150 MG 24 hr tablet Take 150 mg by mouth daily.    Marland Kitchen escitalopram (LEXAPRO) 20 MG tablet Take 20 mg by mouth daily.      Marland Kitchen FOLIC ACID PO Take by mouth daily.    Marland Kitchen levothyroxine (SYNTHROID) 125 MCG tablet TAKE 1 TABLET BY MOUTH EVERY DAY 30 tablet 5  . omeprazole (PRILOSEC) 20 MG capsule TAKE 1 CAPSULE BY MOUTH EVERY DAY 30 capsule 5  . POTASSIUM PO Take by mouth daily.    . risperiDONE (RISPERDAL) 1 MG tablet Take 1 mg by mouth at bedtime.  1  . Vitamin D, Ergocalciferol, (DRISDOL) 1.25 MG (50000 UT) CAPS capsule Take 1 capsule (50,000 Units total) by mouth every 14 (fourteen) days. For 8 weeks. Then every 2 weeks. 6 capsule 3   No current facility-administered medications on file prior to visit.    Past Medical History:  Diagnosis Date  . Anemia   . Barrett esophagus   . Depression   . DUB (dysfunctional uterine bleeding)   . GERD (gastroesophageal reflux disease)   .  Graves disease   . Hypothyroidism    Allergies  Allergen Reactions  . Milk-Related Compounds Other (See Comments)    Intolerance to milk products-causes gas    Social History   Socioeconomic History  . Marital status: Married    Spouse name: Not on file  . Number of children: 0  . Years of education: Not on file  . Highest education level: Not on file  Occupational History    Employer: [employer]  Tobacco Use  . Smoking status: Former Smoker    Packs/day: 1.00    Years: 20.00    Pack years: 20.00    Types: Cigarettes    Start date: 03/13/1967    Quit date:  07/14/1987    Years since quitting: 31.9  . Smokeless tobacco: Never Used  . Tobacco comment: quit 26 years ago  Substance and Sexual Activity  . Alcohol use: No    Alcohol/week: 0.0 standard drinks  . Drug use: No  . Sexual activity: Not on file  Other Topics Concern  . Not on file  Social History Narrative   Occupation: Freight forwarder   Regular exercise- yes   0 caffeine drinks    Social Determinants of Radio broadcast assistant Strain:   . Difficulty of Paying Living Expenses:   Food Insecurity:   . Worried About Charity fundraiser in the Last Year:   . Arboriculturist in the Last Year:   Transportation Needs:   . Film/video editor (Medical):   Marland Kitchen Lack of Transportation (Non-Medical):   Physical Activity:   . Days of Exercise per Week:   . Minutes of Exercise per Session:   Stress:   . Feeling of Stress :   Social Connections:   . Frequency of Communication with Friends and Family:   . Frequency of Social Gatherings with Friends and Family:   . Attends Religious Services:   . Active Member of Clubs or Organizations:   . Attends Archivist Meetings:   Marland Kitchen Marital Status:     Vitals:   06/09/19 1456  BP: 120/80  Pulse: 63  Resp: 16  Temp: 97.7 F (36.5 C)  SpO2: 99%   Body mass index is 28.61 kg/m.  Physical Exam  Nursing note and vitals reviewed. Constitutional: She is oriented to person, place, and time. She appears well-developed. No distress.  HENT:  Head: Normocephalic and atraumatic.  Mouth/Throat: Oropharynx is clear and moist and mucous membranes are normal.  Eyes: Pupils are equal, round, and reactive to light. Conjunctivae are normal.  Cardiovascular: Normal rate and regular rhythm.  No murmur heard. Respiratory: Effort normal and breath sounds normal. No respiratory distress.  GI: Soft. She exhibits no mass. There is no hepatomegaly. There is no abdominal tenderness.  Musculoskeletal:        General: No edema.    Lymphadenopathy:    She has no cervical adenopathy.  Neurological: She is alert and oriented to person, place, and time. She has normal strength. No cranial nerve deficit. Gait normal.  Skin: Skin is warm. Ecchymosis noted. No rash noted. No erythema.     Psychiatric: She has a normal mood and affect.  Well groomed, good eye contact.    ASSESSMENT AND PLAN:  Ms. Karen [name] was seen today for bruises.  Diagnoses and all orders for this visit:  Traumatic ecchymosis of left lower leg, subsequent encounter Most likely related with minor trauma given the fact most are mildly tender and disappear in  a few days. We discussed some side effects of Aspirin, no hx of CVD, so recommend discontinuing it. Instructed about warning signs. Intrusted to mention problem to her hematologist during next visit.  Hypertension, essential, benign BP adequately controlled. Continue Amlodipine 2.5 mg daily. Continue low salt diet. Continue monitoring BP regularly.  Leg cramp Caution with K+ supplementation. Stretching exercise and OTC icy hot/asper cream may help.   Return in about 6 months (around 12/10/2019) for cpe.Please cancel next f/u appt (07/2019)..   Dj Senteno G. [name], MD  [facility]. [place] office.  Discharge Instructions   None     A few things to remember from today's visit:   Continue over the counter potassium for now and let me know if it helps with cramps. Adequate hydration. Continue monitoring blood pressure. Monitor for signs of bleeding. Keeps your next appt with hematologist.   If you need refills please call your pharmacy. Do not use My Chart to request refills or for acute issues that need immediate attention.    Please be sure medication list is accurate. If a new problem present, please set up appointment sooner than planned today.

## 2019-06-09 NOTE — Telephone Encounter (Signed)
Patient scheduled ov for this afternoon to discuss with Dr. [name].

## 2019-06-09 NOTE — Patient Instructions (Signed)
A few things to remember from today's visit:   Continue over the counter potassium for now and let me know if it helps with cramps. Adequate hydration. Continue monitoring blood pressure. Monitor for signs of bleeding. Keeps your next appt with hematologist.   If you need refills please call your pharmacy. Do not use My Chart to request refills or for acute issues that need immediate attention.    Please be sure medication list is accurate. If a new problem present, please set up appointment sooner than planned today.

## 2019-06-09 NOTE — Telephone Encounter (Signed)
Bruises on the enter part of her arm and very tender to the touch and  would like to know what she needs to do.  She does not know if it is coming from one of her medications or where it is coming from.  Pt would like to have a call back.

## 2019-06-15 ENCOUNTER — Telehealth: Payer: Self-pay | Admitting: *Deleted

## 2019-06-15 ENCOUNTER — Other Ambulatory Visit: Payer: Self-pay | Admitting: Family Medicine

## 2019-06-15 NOTE — Telephone Encounter (Signed)
Patient called after hours line. Patient reports she would like allergist referral. She has itchy eyes with puffiness

## 2019-06-15 NOTE — Telephone Encounter (Signed)
Patient notified. Patient scheduled an appointment to discuss further options.

## 2019-06-15 NOTE — Telephone Encounter (Signed)
Usually I recommend allergist evaluation when treatments have not helped. I do not see any allergy medication on her medication list. If she is not taking medication, I recommend trying something first before immunologist evaluation. Zyrtec 10 mg daily in the morning may help. We have other medications that are prescription that we could add if problem is persistent. If she insist on referral to immunologist, it can be placed. Thanks,  BJ

## 2019-06-15 NOTE — Telephone Encounter (Signed)
Is it okay to place referral? Please advise.

## 2019-06-22 ENCOUNTER — Other Ambulatory Visit: Payer: Self-pay

## 2019-06-22 ENCOUNTER — Encounter: Payer: Self-pay | Admitting: Family Medicine

## 2019-06-22 ENCOUNTER — Ambulatory Visit (INDEPENDENT_AMBULATORY_CARE_PROVIDER_SITE_OTHER): Payer: Managed Care, Other (non HMO) | Admitting: Family Medicine

## 2019-06-22 VITALS — BP 122/70 | HR 64 | Temp 97.2°F | Resp 12 | Ht 70.0 in | Wt 199.0 lb

## 2019-06-22 DIAGNOSIS — T783XXA Angioneurotic edema, initial encounter: Secondary | ICD-10-CM | POA: Diagnosis not present

## 2019-06-22 DIAGNOSIS — L509 Urticaria, unspecified: Secondary | ICD-10-CM | POA: Diagnosis not present

## 2019-06-22 NOTE — Progress Notes (Signed)
ACUTE VISIT  Chief Complaint  Patient presents with  . Facial Swelling  . Urticaria   HPI: [name] is a 59 y.o. female, who is here today complaining of intermittent facial edema for the past few months. She called a few days ago requesting referral to allergist.  Edema is usually around the eyes but has had edema of upper lip. States that ir is not bad today.  She has not had associated fever, sore throat, stridor, dysphagia,dysphonia, cough,wheezing,or dyspnea.  Negative for new medication,soap,detergent,type of food,sick contact,outdoors exposure,or insect bite.  For a while she also has had recurrent episodes of urticaria. She has not identified exacerbating or alleviating factors.  She usually feels a burning sensation on the skin,she rubs/scratches area and a few minutes after she noted a raised erythematous rash. Negative for diarrhea,abdominal pain,nausea,or vomiting. She is taking OTC Benadryl. Problem has been stable but states that she is tired of taking Benadryl.  Review of Systems  Constitutional: Negative for activity change, appetite change and fatigue.  HENT: Negative for mouth sores and nosebleeds.   Eyes: Positive for itching. Negative for discharge, redness and visual disturbance.  Cardiovascular: Negative for chest pain, palpitations and leg swelling.  Gastrointestinal:       Negative for changes in bowel habits.  Genitourinary: Negative for decreased urine volume and hematuria.  Musculoskeletal: Negative for arthralgias and myalgias.  Allergic/Immunologic: Negative for environmental allergies.  Neurological: Negative for syncope, weakness and headaches.  Psychiatric/Behavioral: Negative for confusion. The patient is nervous/anxious.   Rest see pertinent positives and negatives per HPI.   Current Outpatient Medications on File Prior to Visit  Medication Sig Dispense Refill  . amLODipine (NORVASC) 5 MG tablet Take 0.5 tablets (2.5  mg total) by mouth daily. 45 tablet 1  . buPROPion (WELLBUTRIN XL) 150 MG 24 hr tablet Take 150 mg by mouth daily.    Marland Kitchen escitalopram (LEXAPRO) 20 MG tablet Take 20 mg by mouth daily.      Marland Kitchen FOLIC ACID PO Take by mouth daily.    Marland Kitchen levothyroxine (SYNTHROID) 125 MCG tablet TAKE 1 TABLET BY MOUTH EVERY DAY 30 tablet 5  . omeprazole (PRILOSEC) 20 MG capsule TAKE 1 CAPSULE BY MOUTH EVERY DAY 30 capsule 5  . POTASSIUM PO Take by mouth daily.    . risperiDONE (RISPERDAL) 1 MG tablet Take 1 mg by mouth at bedtime.  1  . Vitamin D, Ergocalciferol, (DRISDOL) 1.25 MG (50000 UT) CAPS capsule Take 1 capsule (50,000 Units total) by mouth every 14 (fourteen) days. For 8 weeks. Then every 2 weeks. 6 capsule 3   No current facility-administered medications on file prior to visit.     Past Medical History:  Diagnosis Date  . Anemia   . Barrett esophagus   . Depression   . DUB (dysfunctional uterine bleeding)   . GERD (gastroesophageal reflux disease)   . Graves disease   . Hypothyroidism    Allergies  Allergen Reactions  . Milk-Related Compounds Other (See Comments)    Intolerance to milk products-causes gas    Social History   Socioeconomic History  . Marital status: Married    Spouse name: Not on file  . Number of children: 0  . Years of education: Not on file  . Highest education level: Not on file  Occupational History    Employer: [employer]  Tobacco Use  . Smoking status: Former Smoker    Packs/day: 1.00    Years: 20.00  Pack years: 20.00    Types: Cigarettes    Start date: 03/13/1967    Quit date: 07/14/1987    Years since quitting: 31.9  . Smokeless tobacco: Never Used  . Tobacco comment: quit 26 years ago  Substance and Sexual Activity  . Alcohol use: No    Alcohol/week: 0.0 standard drinks  . Drug use: No  . Sexual activity: Not on file  Other Topics Concern  . Not on file  Social History Narrative   Occupation: Freight forwarder   Regular exercise- yes   0 caffeine  drinks    Social Determinants of Radio broadcast assistant Strain:   . Difficulty of Paying Living Expenses:   Food Insecurity:   . Worried About Charity fundraiser in the Last Year:   . Arboriculturist in the Last Year:   Transportation Needs:   . Film/video editor (Medical):   Marland Kitchen Lack of Transportation (Non-Medical):   Physical Activity:   . Days of Exercise per Week:   . Minutes of Exercise per Session:   Stress:   . Feeling of Stress :   Social Connections:   . Frequency of Communication with Friends and Family:   . Frequency of Social Gatherings with Friends and Family:   . Attends Religious Services:   . Active Member of Clubs or Organizations:   . Attends Archivist Meetings:   Marland Kitchen Marital Status:     Vitals:   06/22/19 1458  BP: 122/70  Pulse: 64  Resp: 12  Temp: (!) 97.2 F (36.2 C)  SpO2: 98%   Body mass index is 28.55 kg/m.  Physical Exam  Nursing note and vitals reviewed. Constitutional: She is oriented to person, place, and time. She appears well-developed. She does not appear ill. No distress.  HENT:  Head: Normocephalic and atraumatic.  Nose: Right sinus exhibits no maxillary sinus tenderness and no frontal sinus tenderness. Left sinus exhibits no maxillary sinus tenderness and no frontal sinus tenderness.  Mouth/Throat: Oropharynx is clear and moist and mucous membranes are normal.  Eyes: Pupils are equal, round, and reactive to light. Conjunctivae and EOM are normal.  Minimal edema of tarsal conjunctivae left eye. No conjunctival erythema. Lower mild palpebral edema,left.  Neck: No thyroid mass present.  Respiratory: Effort normal and breath sounds normal. No respiratory distress.  Lymphadenopathy:       Head (right side): No posterior auricular adenopathy present.       Head (left side): No posterior auricular adenopathy present.    She has no cervical adenopathy.  Neurological: She is alert and oriented to person, place, and  time. She has normal strength. No cranial nerve deficit. Gait normal.  Skin: Skin is warm. No rash noted. No erythema.  Psychiatric: Her mood appears anxious.  Well groomed,good eye contact.   ASSESSMENT AND PLAN:  Ms. [name] was seen today for facial swelling and urticaria.  Diagnoses and all orders for this visit:  Urticaria No lesions appreciated today. Recommend stopping Benadryl. Recommend starting Zyrtec 10 mg daily.  -     Ambulatory referral to Immunology  Angioedema, initial encounter Improved. I do not think oral steroids at needed at this time. Zyrtec 10 mg daily. Instructed about warning signs.   Return if symptoms worsen or fail to improve.   Masiya Claassen G. [name], MD  [facility]. [place] office.  Discharge Instructions   None    A few things to remember from today's visit:  Stop Benadryl. Try Cetirizine 10 mg daily in the morning. Immunology referral placed, please let them know you are taking Cetirizine, you may need to stop it for a few days before appt.   If you need refills please call your pharmacy. Do not use My Chart to request refills or for acute issues that need immediate attention.    Please be sure medication list is accurate. If a new problem present, please set up appointment sooner than planned today.

## 2019-06-22 NOTE — Patient Instructions (Signed)
A few things to remember from today's visit:   Stop Benadryl. Try Cetirizine 10 mg daily in the morning. Immunology referral placed, please let them know you are taking Cetirizine, you may need to stop it for a few days before appt.   If you need refills please call your pharmacy. Do not use My Chart to request refills or for acute issues that need immediate attention.    Please be sure medication list is accurate. If a new problem present, please set up appointment sooner than planned today.

## 2019-06-24 ENCOUNTER — Encounter: Payer: Self-pay | Admitting: Family Medicine

## 2019-06-30 ENCOUNTER — Telehealth: Payer: Self-pay

## 2019-06-30 NOTE — Telephone Encounter (Signed)
Called pt to advised that she left her folder with her AVS in it from her other doctors office. Was calling to see if she wanted to pick it up. If so, it is placed in the front office filling cabinet.

## 2019-07-02 ENCOUNTER — Inpatient Hospital Stay: Payer: Managed Care, Other (non HMO) | Attending: Hematology & Oncology

## 2019-07-02 ENCOUNTER — Other Ambulatory Visit: Payer: Self-pay | Admitting: Family Medicine

## 2019-07-02 ENCOUNTER — Other Ambulatory Visit: Payer: Self-pay

## 2019-07-02 ENCOUNTER — Inpatient Hospital Stay: Payer: Managed Care, Other (non HMO)

## 2019-07-02 VITALS — BP 125/74 | HR 84 | Resp 18

## 2019-07-02 DIAGNOSIS — D631 Anemia in chronic kidney disease: Secondary | ICD-10-CM | POA: Diagnosis present

## 2019-07-02 DIAGNOSIS — N189 Chronic kidney disease, unspecified: Secondary | ICD-10-CM

## 2019-07-02 DIAGNOSIS — D500 Iron deficiency anemia secondary to blood loss (chronic): Secondary | ICD-10-CM

## 2019-07-02 DIAGNOSIS — Z1231 Encounter for screening mammogram for malignant neoplasm of breast: Secondary | ICD-10-CM

## 2019-07-02 LAB — CBC WITH DIFFERENTIAL (CANCER CENTER ONLY)
Abs Immature Granulocytes: 0 10*3/uL (ref 0.00–0.07)
Basophils Absolute: 0 10*3/uL (ref 0.0–0.1)
Basophils Relative: 0 %
Eosinophils Absolute: 0 10*3/uL (ref 0.0–0.5)
Eosinophils Relative: 1 %
HCT: 28.7 % — ABNORMAL LOW (ref 36.0–46.0)
Hemoglobin: 8.9 g/dL — ABNORMAL LOW (ref 12.0–15.0)
Immature Granulocytes: 0 %
Lymphocytes Relative: 39 %
Lymphs Abs: 1 10*3/uL (ref 0.7–4.0)
MCH: 28.5 pg (ref 26.0–34.0)
MCHC: 31 g/dL (ref 30.0–36.0)
MCV: 92 fL (ref 80.0–100.0)
Monocytes Absolute: 0.3 10*3/uL (ref 0.1–1.0)
Monocytes Relative: 11 %
Neutro Abs: 1.2 10*3/uL — ABNORMAL LOW (ref 1.7–7.7)
Neutrophils Relative %: 49 %
Platelet Count: 202 10*3/uL (ref 150–400)
RBC: 3.12 MIL/uL — ABNORMAL LOW (ref 3.87–5.11)
RDW: 13.2 % (ref 11.5–15.5)
WBC Count: 2.5 10*3/uL — ABNORMAL LOW (ref 4.0–10.5)
nRBC: 0 % (ref 0.0–0.2)

## 2019-07-02 LAB — IRON AND TIBC
Iron: 50 ug/dL (ref 41–142)
Saturation Ratios: 20 % — ABNORMAL LOW (ref 21–57)
TIBC: 249 ug/dL (ref 236–444)
UIBC: 199 ug/dL (ref 120–384)

## 2019-07-02 LAB — CMP (CANCER CENTER ONLY)
ALT: 21 U/L (ref 0–44)
AST: 29 U/L (ref 15–41)
Albumin: 3.9 g/dL (ref 3.5–5.0)
Alkaline Phosphatase: 92 U/L (ref 38–126)
Anion gap: 10 (ref 5–15)
BUN: 9 mg/dL (ref 6–20)
CO2: 20 mmol/L — ABNORMAL LOW (ref 22–32)
Calcium: 9 mg/dL (ref 8.9–10.3)
Chloride: 107 mmol/L (ref 98–111)
Creatinine: 1 mg/dL (ref 0.44–1.00)
GFR, Est AFR Am: >60 mL/min (ref >60)
GFR, Estimated: >60 mL/min (ref >60)
Glucose, Bld: 86 mg/dL (ref 70–99)
Potassium: 3.7 mmol/L (ref 3.5–5.1)
Sodium: 137 mmol/L (ref 135–145)
Total Bilirubin: 0.5 mg/dL (ref 0.3–1.2)
Total Protein: 7.2 g/dL (ref 6.5–8.1)

## 2019-07-02 LAB — FERRITIN: Ferritin: 302 ng/mL (ref 11–307)

## 2019-07-02 MED ORDER — EPOETIN ALFA-EPBX 10000 UNIT/ML IJ SOLN
INTRAMUSCULAR | Status: AC
  Filled 2019-07-02: qty 2

## 2019-07-02 MED ORDER — EPOETIN ALFA-EPBX 10000 UNIT/ML IJ SOLN
20000.0000 [IU] | Freq: Once | INTRAMUSCULAR | Status: AC
  Administered 2019-07-02: 20000 [IU] via SUBCUTANEOUS

## 2019-07-02 NOTE — Patient Instructions (Signed)

## 2019-07-06 NOTE — Progress Notes (Signed)
Sent message to [facility] and she added Dr Landis Gandy nurse into the message to call patient she had some concerns about facial edema that her PCP thought was due to allergies.

## 2019-07-07 ENCOUNTER — Telehealth: Payer: Self-pay | Admitting: *Deleted

## 2019-07-07 NOTE — Telephone Encounter (Signed)
Received VM from pt.  Attempt x1 to return call, no answer, LVM to return call to the office.  

## 2019-07-08 ENCOUNTER — Telehealth: Payer: Self-pay | Admitting: *Deleted

## 2019-07-08 NOTE — Telephone Encounter (Signed)
Attempt x2 to return call to pt.  No answer, LVM to return call to the office.

## 2019-07-12 ENCOUNTER — Telehealth: Payer: Self-pay | Admitting: *Deleted

## 2019-07-12 NOTE — Telephone Encounter (Signed)
Received call from pt stating she has been trying to contact the office with no return call.  This is RNs 3rd attempt to return call to pt.  No answer left detailed message to return call to the office.

## 2019-07-14 ENCOUNTER — Telehealth: Payer: Self-pay | Admitting: *Deleted

## 2019-07-14 ENCOUNTER — Ambulatory Visit: Payer: Managed Care, Other (non HMO) | Admitting: Family Medicine

## 2019-07-14 ENCOUNTER — Telehealth: Payer: Self-pay

## 2019-07-14 NOTE — Telephone Encounter (Signed)
Received VM from pt stating she has been calling the office x1 week with no return call.  RN attempted to call pt at number provided by pt which is the same number in the chart for the 5th time.  No answer, LVM.

## 2019-07-14 NOTE — Telephone Encounter (Signed)
Karen [name] has called and not been able to reach [place] and wants to change physicians per nurse's report.  I tried to call today and left her a message with my direct line [phone] or cell [phone].  Advised her to call me back tomorrow so I can help her with her issues.  Gardiner Rhyme, RN.

## 2019-07-15 ENCOUNTER — Telehealth: Payer: Self-pay | Admitting: *Deleted

## 2019-07-15 NOTE — Progress Notes (Signed)
Patient Care Team: [name], [name], MD as PCP - General (Family Medicine)  DIAGNOSIS:    ICD-10-CM   1. Anemia of renal disease  N18.9    D63.1      CHIEF COMPLIANT: Follow-up of anemia  INTERVAL HISTORY: Karen [name] is a 59 y.o. with above-mentioned history of anemia previously treated with Retacrit. She presents to the clinic todayfor follow-up.  ALLERGIES:  is allergic to milk-related compounds.  MEDICATIONS:  Current Outpatient Medications  Medication Sig Dispense Refill   amLODipine (NORVASC) 5 MG tablet Take 0.5 tablets (2.5 mg total) by mouth daily. 45 tablet 1   buPROPion (WELLBUTRIN XL) 150 MG 24 hr tablet Take 150 mg by mouth daily.     escitalopram (LEXAPRO) 20 MG tablet Take 20 mg by mouth daily.       FOLIC ACID PO Take by mouth daily.     levothyroxine (SYNTHROID) 125 MCG tablet TAKE 1 TABLET BY MOUTH EVERY DAY 30 tablet 5   omeprazole (PRILOSEC) 20 MG capsule TAKE 1 CAPSULE BY MOUTH EVERY DAY 30 capsule 5   POTASSIUM PO Take by mouth daily.     risperiDONE (RISPERDAL) 1 MG tablet Take 1 mg by mouth at bedtime.  1   Vitamin D, Ergocalciferol, (DRISDOL) 1.25 MG (50000 UT) CAPS capsule Take 1 capsule (50,000 Units total) by mouth every 14 (fourteen) days. For 8 weeks. Then every 2 weeks. 6 capsule 3   No current facility-administered medications for this visit.    PHYSICAL EXAMINATION: ECOG PERFORMANCE STATUS: 1 - Symptomatic but completely ambulatory  Vitals:   07/16/19 1430  BP: (!) 141/73  Pulse: 80  Resp: 20  Temp: 98.7 F (37.1 C)  SpO2: 100%   Filed Weights   07/16/19 1430  Weight: 196 lb 14.4 oz (89.3 kg)    LABORATORY DATA:  I have reviewed the data as listed CMP Latest Ref Rng & Units 07/02/2019 05/18/2019 05/03/2019  Glucose 70 - 99 mg/dL 86 89 86  BUN 6 - 20 mg/dL '9 12 11  ' Creatinine 0.44 - 1.00 mg/dL 1.00 0.97 1.13(H)  Sodium 135 - 145 mmol/L 137 134(L) 142  Potassium 3.5 - 5.1 mmol/L 3.7 4.1 4.0  Chloride 98 - 111 mmol/L  107 101 108  CO2 22 - 32 mmol/L 20(L) 27 26  Calcium 8.9 - 10.3 mg/dL 9.0 9.1 9.4  Total Protein 6.5 - 8.1 g/dL 7.2 - 7.5  Total Bilirubin 0.3 - 1.2 mg/dL 0.5 - 0.5  Alkaline Phos 38 - 126 U/L 92 - 84  AST 15 - 41 U/L 29 - 19  ALT 0 - 44 U/L 21 - 12    Lab Results  Component Value Date   WBC 2.5 (L) 07/02/2019   HGB 8.9 (L) 07/02/2019   HCT 28.7 (L) 07/02/2019   MCV 92.0 07/02/2019   PLT 202 07/02/2019   NEUTROABS 1.2 (L) 07/02/2019    ASSESSMENT & PLAN:  Iron deficiency anemia Previous iron infusion: June 2018 ESA therapy: Previously received Aranesp 2-3 times per year from 2014(with Dr. Marin Olp) Current treatment: Retacrit 20,000 units monthly started 10/02/2018 (2 doses received so far) goal: Hemoglobin of 10 or above  Lab review:   03/03/2019: Hemoglobin 9.2, ANC 1.5, WBC 2.9 Mild leukopenia: Cyclical 1/66/0630: Hemoglobin 8.9, ANC 1.2  Patient will come every other month for lab checks and Retacrit injections last injection was 07/02/2019. Return to clinic every 2 months for Retacrit injections.  We will increase the dosage to 40,000 units every  2 months. Because of the anemia and leukopenia, I recommend that we do a bone marrow biopsy. She will be set up to do a bone marrow next Tuesday with Mendel Ryder. I will see her 1 week after the bone marrow to go over the results.   No orders of the defined types were placed in this encounter.  The patient has a good understanding of the overall plan. she agrees with it. she will call with any problems that may develop before the next visit here.  Total time spent: 30 mins including face to face time and time spent for planning, charting and coordination of care  Nicholas Lose, MD 07/16/2019  I, Cloyde Reams Dorshimer, am acting as scribe for Dr. Nicholas Lose.  I have reviewed the above documentation for accuracy and completeness, and I agree with the above.

## 2019-07-15 NOTE — Telephone Encounter (Signed)
Received call from pt, attempt x1 to return call.  No answer, LVM to return call to the office.

## 2019-07-16 ENCOUNTER — Telehealth: Payer: Self-pay | Admitting: *Deleted

## 2019-07-16 ENCOUNTER — Inpatient Hospital Stay: Payer: Managed Care, Other (non HMO) | Attending: Hematology & Oncology | Admitting: Hematology and Oncology

## 2019-07-16 ENCOUNTER — Other Ambulatory Visit: Payer: Self-pay

## 2019-07-16 VITALS — BP 141/73 | HR 80 | Temp 98.7°F | Resp 20 | Ht 71.5 in | Wt 196.9 lb

## 2019-07-16 DIAGNOSIS — E611 Iron deficiency: Secondary | ICD-10-CM | POA: Insufficient documentation

## 2019-07-16 DIAGNOSIS — Z79899 Other long term (current) drug therapy: Secondary | ICD-10-CM | POA: Insufficient documentation

## 2019-07-16 DIAGNOSIS — N189 Chronic kidney disease, unspecified: Secondary | ICD-10-CM

## 2019-07-16 DIAGNOSIS — D631 Anemia in chronic kidney disease: Secondary | ICD-10-CM | POA: Diagnosis present

## 2019-07-16 DIAGNOSIS — D72819 Decreased white blood cell count, unspecified: Secondary | ICD-10-CM | POA: Diagnosis not present

## 2019-07-16 NOTE — Assessment & Plan Note (Signed)
Previous iron infusion: June 2018 ESA therapy: Previously received Aranesp 2-3 times per year from 2014(with Dr. Marin Olp) Current treatment: Retacrit 20,000 units monthly started 10/02/2018 (2 doses received so far) goal: Hemoglobin of 10 or above  Lab review:   03/03/2019: Hemoglobin 9.2, ANC 1.5, WBC 2.9 Mild leukopenia: Cyclical 2/44/0102:  Patient will come every other month for lab checks and Retacrit injections last injection was 07/02/2019. Return to clinic in 6 months with labs and follow-up

## 2019-07-16 NOTE — Telephone Encounter (Signed)
Received call from pt with concerns of her hemoglobin dropping with complaints of increase fatigue.  Pt requesting in person apt with MD to review lab results.  Apt scheduled and pt verbalized understanding of date and time.

## 2019-07-19 ENCOUNTER — Telehealth: Payer: Self-pay

## 2019-07-19 ENCOUNTER — Ambulatory Visit: Payer: Managed Care, Other (non HMO) | Admitting: Family Medicine

## 2019-07-19 NOTE — Telephone Encounter (Signed)
FYI

## 2019-07-21 ENCOUNTER — Other Ambulatory Visit: Payer: Self-pay

## 2019-07-21 ENCOUNTER — Inpatient Hospital Stay: Payer: Managed Care, Other (non HMO)

## 2019-07-21 ENCOUNTER — Inpatient Hospital Stay (HOSPITAL_BASED_OUTPATIENT_CLINIC_OR_DEPARTMENT_OTHER): Payer: Managed Care, Other (non HMO) | Admitting: Adult Health

## 2019-07-21 VITALS — BP 139/73 | HR 68 | Temp 98.9°F | Resp 18

## 2019-07-21 DIAGNOSIS — D464 Refractory anemia, unspecified: Secondary | ICD-10-CM | POA: Diagnosis not present

## 2019-07-21 DIAGNOSIS — D72819 Decreased white blood cell count, unspecified: Secondary | ICD-10-CM | POA: Diagnosis not present

## 2019-07-21 DIAGNOSIS — N189 Chronic kidney disease, unspecified: Secondary | ICD-10-CM | POA: Diagnosis not present

## 2019-07-21 DIAGNOSIS — D708 Other neutropenia: Secondary | ICD-10-CM

## 2019-07-21 LAB — CBC (CANCER CENTER ONLY)
HCT: 29.2 % — ABNORMAL LOW (ref 36.0–46.0)
Hemoglobin: 9.2 g/dL — ABNORMAL LOW (ref 12.0–15.0)
MCH: 29.1 pg (ref 26.0–34.0)
MCHC: 31.5 g/dL (ref 30.0–36.0)
MCV: 92.4 fL (ref 80.0–100.0)
Platelet Count: 203 10*3/uL (ref 150–400)
RBC: 3.16 MIL/uL — ABNORMAL LOW (ref 3.87–5.11)
RDW: 13.7 % (ref 11.5–15.5)
WBC Count: 5.2 10*3/uL (ref 4.0–10.5)
nRBC: 0 % (ref 0.0–0.2)

## 2019-07-21 MED ORDER — LIDOCAINE HCL 2 % IJ SOLN
INTRAMUSCULAR | Status: AC
  Filled 2019-07-21: qty 20

## 2019-07-21 NOTE — Progress Notes (Signed)
7209 - Sacral dressing clean dry & intact, VS WNL, pt DC'd in stable condition.

## 2019-07-21 NOTE — Progress Notes (Signed)
INDICATION: refractory anemia, leukopenia  Brief examination was performed. ENT: adequate airway clearance Heart: regular rate and rhythm.No Murmurs Lungs: clear to auscultation, no wheezes, normal respiratory effort  Bone Marrow Biopsy and Aspiration Procedure Note   Informed consent was obtained and potential risks including bleeding, infection and pain were reviewed with the patient.  The patient's name, date of birth, identification, consent and allergies were verified prior to the start of procedure and time out was performed.  The left posterior iliac crest was chosen as the site of biopsy.  The skin was prepped with ChloraPrep.   12 cc of 2% lidocaine was used to provide local anaesthesia.   10 cc of bone marrow aspirate was obtained followed by 1cm biopsy.  Pressure was applied to the biopsy site and bandage was placed over the biopsy site. Patient was made to lie on the back for 30 mins prior to discharge.  The procedure was tolerated well. COMPLICATIONS: None BLOOD LOSS: none The patient was discharged home in stable condition with a follow up to review results.  Patient was provided with post bone marrow biopsy instructions and instructed to call if there was any bleeding or worsening pain.  Specimens sent for flow cytometry, cytogenetics and additional studies.  Signed Scot Dock, NP

## 2019-07-21 NOTE — Patient Instructions (Signed)
Bone Marrow Aspiration and Bone Marrow Biopsy, Adult, Care After This sheet gives you information about how to care for yourself after your procedure. Your health care provider may also give you more specific instructions. If you have problems or questions, contact your health care provider. What can I expect after the procedure? After the procedure, it is common to have:  Mild pain and tenderness.  Swelling.  Bruising. Follow these instructions at home: Puncture site care   Follow instructions from your health care provider about how to take care of the puncture site. Make sure you: ? Wash your hands with soap and water before and after you change your bandage (dressing). If soap and water are not available, use hand sanitizer. ? Change your dressing as told by your health care provider.  Check your puncture site every day for signs of infection. Check for: ? More redness, swelling, or pain. ? Fluid or blood. ? Warmth. ? Pus or a bad smell. Activity  Return to your normal activities as told by your health care provider. Ask your health care provider what activities are safe for you.  Do not lift anything that is heavier than 10 lb (4.5 kg), or the limit that you are told, until your health care provider says that it is safe.  Do not drive for 24 hours if you were given a sedative during your procedure. General instructions   Take over-the-counter and prescription medicines only as told by your health care provider.  Do not take baths, swim, or use a hot tub until your health care provider approves. Ask your health care provider if you may take showers. You may only be allowed to take sponge baths.  If directed, put ice on the affected area. To do this: ? Put ice in a plastic bag. ? Place a towel between your skin and the bag. ? Leave the ice on for 20 minutes, 2-3 times a day.  Keep all follow-up visits as told by your health care provider. This is important. Contact a  health care provider if:  Your pain is not controlled with medicine.  You have a fever.  You have more redness, swelling, or pain around the puncture site.  You have fluid or blood coming from the puncture site.  Your puncture site feels warm to the touch.  You have pus or a bad smell coming from the puncture site. Summary  After the procedure, it is common to have mild pain, tenderness, swelling, and bruising.  Follow instructions from your health care provider about how to take care of the puncture site and what activities are safe for you.  Take over-the-counter and prescription medicines only as told by your health care provider.  Contact a health care provider if you have any signs of infection, such as fluid or blood coming from the puncture site. This information is not intended to replace advice given to you by your health care provider. Make sure you discuss any questions you have with your health care provider. Document Revised: 06/09/2018 Document Reviewed: 06/09/2018 Elsevier Patient Education  2020 [employer] Inc[employer]  

## 2019-07-22 ENCOUNTER — Encounter: Payer: Self-pay | Admitting: Family Medicine

## 2019-07-22 ENCOUNTER — Telehealth: Payer: Self-pay | Admitting: Adult Health

## 2019-07-22 ENCOUNTER — Ambulatory Visit (INDEPENDENT_AMBULATORY_CARE_PROVIDER_SITE_OTHER): Payer: Managed Care, Other (non HMO) | Admitting: Family Medicine

## 2019-07-22 VITALS — BP 120/62 | HR 73 | Temp 97.7°F | Wt 192.6 lb

## 2019-07-22 DIAGNOSIS — N390 Urinary tract infection, site not specified: Secondary | ICD-10-CM

## 2019-07-22 DIAGNOSIS — R300 Dysuria: Secondary | ICD-10-CM

## 2019-07-22 LAB — POCT URINALYSIS DIPSTICK
Bilirubin, UA: NEGATIVE
Blood, UA: NEGATIVE
Glucose, UA: NEGATIVE
Ketones, UA: NEGATIVE
Leukocytes, UA: NEGATIVE
Nitrite, UA: NEGATIVE
Protein, UA: NEGATIVE
Spec Grav, UA: 1.02 (ref 1.010–1.025)
Urobilinogen, UA: 0.2 E.U./dL
pH, UA: 6 (ref 5.0–8.0)

## 2019-07-22 MED ORDER — CIPROFLOXACIN HCL 500 MG PO TABS
500.0000 mg | ORAL_TABLET | Freq: Two times a day (BID) | ORAL | 0 refills | Status: DC
Start: 2019-07-22 — End: 2019-08-19

## 2019-07-22 NOTE — Addendum Note (Signed)
Addended by: Marrion Coy on: 07/22/2019 11:59 AM   Modules accepted: Orders

## 2019-07-22 NOTE — Telephone Encounter (Signed)
Scheduled appt and cancelled an appt per 6/16 los. Pt confirmed new appt and appt cancellation.

## 2019-07-22 NOTE — Progress Notes (Signed)
   Subjective:    Patient ID: Karen [name], female    DOB: Nov 29, 1960, 59 y.o.   MRN: [id number]  HPI Here for 4 days of urinary urgency and burning. No back pain or fever. Drinking plenty of water.    Review of Systems  Constitutional: Negative.   Respiratory: Negative.   Cardiovascular: Negative.   Gastrointestinal: Negative.   Genitourinary: Positive for dysuria, frequency and urgency. Negative for flank pain and hematuria.       Objective:   Physical Exam Constitutional:      Appearance: Normal appearance.  Cardiovascular:     Rate and Rhythm: Normal rate and regular rhythm.     Pulses: Normal pulses.     Heart sounds: Normal heart sounds.  Pulmonary:     Effort: Pulmonary effort is normal.     Breath sounds: Normal breath sounds.  Abdominal:     General: Abdomen is flat. Bowel sounds are normal. There is no distension.     Palpations: Abdomen is soft. There is no mass.     Tenderness: There is no abdominal tenderness. There is no right CVA tenderness, left CVA tenderness, guarding or rebound.     Hernia: No hernia is present.  Neurological:     Mental Status: She is alert.           Assessment & Plan:  UTI, treat with Cipro. Culture the sample.  Alysia Penna, MD

## 2019-07-23 ENCOUNTER — Ambulatory Visit: Payer: Managed Care, Other (non HMO) | Admitting: Family Medicine

## 2019-07-23 LAB — URINE CULTURE
MICRO NUMBER:: 10603430
SPECIMEN QUALITY:: ADEQUATE

## 2019-07-28 ENCOUNTER — Telehealth: Payer: Self-pay | Admitting: Hematology and Oncology

## 2019-07-28 ENCOUNTER — Encounter (HOSPITAL_COMMUNITY): Payer: Self-pay | Admitting: Hematology and Oncology

## 2019-07-28 NOTE — Telephone Encounter (Signed)
R/s appt per 6/21 sch message - pt is aware of new appt.

## 2019-07-30 LAB — SURGICAL PATHOLOGY

## 2019-08-03 NOTE — Progress Notes (Signed)
  HEMATOLOGY-ONCOLOGY TELEPHONE VISIT PROGRESS NOTE  I connected with [place] on 08/04/2019 at  9:30 AM EDT by telephone and verified that I am speaking with the correct person using two identifiers.  I discussed the limitations, risks, security and privacy concerns of performing an evaluation and management service by telephone and the availability of in person appointments.  I also discussed with the patient that there may be a patient responsible charge related to this service. The patient expressed understanding and agreed to proceed.   History of Present Illness: Karen [name] is a 59 y.o. female with above-mentioned history of anemiapreviously treated with Retacrit. She underwent a bone marrow biopsy on 07/21/19 and presents over the phone today for follow-up.  She reports to be doing quite well.  She did not have any major side effects of the bone marrow biopsy.  Observations/Objective:  Mild fatigue from anemia.   Assessment Plan:  Anemia of renal disease Previous iron infusion: June 2018 ESA therapy: Previously received Aranesp 2-3 times per year from 2014(with Dr. Marin Olp) Current treatment: Retacrit 20,000 units monthly started 10/02/2018(2 doses received so far) goal: Hemoglobin of 10 or above  Bone marrow biopsy performed because of anemia and leukopenia: Normocellular bone marrow with trilineage hematopoiesis with mild megakaryocytic atypia  Pathology review: I discussed with the patient the details of the bone marrow biopsy.  There is no clonal hematopoietic disorders.  Lab review: 07/21/2019: Hemoglobin 9.2 Interestingly WBC was 5.2 and platelet count 203 on that day  Return to clinic every 2 months for Retacrit injections.  We will increase the dosage to 40,000 units every 2 months    I discussed the assessment and treatment plan with the patient. The patient was provided an opportunity to ask questions and all were answered. The patient agreed with the plan  and demonstrated an understanding of the instructions. The patient was advised to call back or seek an in-person evaluation if the symptoms worsen or if the condition fails to improve as anticipated.   I provided 11 minutes of non-face-to-face time during this encounter.   Rulon Eisenmenger, MD 08/04/2019    I, Molly Dorshimer, am acting as scribe for Nicholas Lose, MD.  I have reviewed the above documentation for accuracy and completeness, and I agree with the above.

## 2019-08-04 ENCOUNTER — Inpatient Hospital Stay (HOSPITAL_BASED_OUTPATIENT_CLINIC_OR_DEPARTMENT_OTHER): Payer: Managed Care, Other (non HMO) | Admitting: Hematology and Oncology

## 2019-08-04 DIAGNOSIS — N189 Chronic kidney disease, unspecified: Secondary | ICD-10-CM | POA: Diagnosis not present

## 2019-08-04 DIAGNOSIS — D631 Anemia in chronic kidney disease: Secondary | ICD-10-CM

## 2019-08-04 NOTE — Assessment & Plan Note (Signed)
Previous iron infusion: June 2018 ESA therapy: Previously received Aranesp 2-3 times per year from 2014(with Dr. Marin Olp) Current treatment: Retacrit 20,000 units monthly started 10/02/2018(2 doses received so far) goal: Hemoglobin of 10 or above  Bone marrow biopsy performed because of anemia and leukopenia: Normocellular bone marrow with trilineage hematopoiesis with mild megakaryocytic atypia  Pathology review: I discussed with the patient the details of the bone marrow biopsy.  There is no clonal hematopoietic disorders.  Lab review: 07/21/2019: Hemoglobin 9.2 Interestingly WBC was 5.2 and platelet count 203 on that day  Return to clinic every 2 months for Retacrit injections.  We will increase the dosage to 40,000 units every 2 months

## 2019-08-05 ENCOUNTER — Ambulatory Visit: Payer: Self-pay | Admitting: Allergy & Immunology

## 2019-08-05 ENCOUNTER — Other Ambulatory Visit: Payer: Self-pay

## 2019-08-05 ENCOUNTER — Telehealth: Payer: Self-pay | Admitting: Hematology and Oncology

## 2019-08-05 ENCOUNTER — Ambulatory Visit: Payer: Managed Care, Other (non HMO) | Admitting: Adult Health

## 2019-08-05 ENCOUNTER — Ambulatory Visit
Admission: RE | Admit: 2019-08-05 | Discharge: 2019-08-05 | Disposition: A | Discharge status: Home / Self Care | Payer: Managed Care, Other (non HMO) | Source: Ambulatory Visit | Attending: Family Medicine | Admitting: Family Medicine

## 2019-08-05 DIAGNOSIS — Z1231 Encounter for screening mammogram for malignant neoplasm of breast: Secondary | ICD-10-CM

## 2019-08-05 NOTE — Telephone Encounter (Signed)
No 6/30 los, no changes made to patient schedule  

## 2019-08-06 ENCOUNTER — Ambulatory Visit: Payer: Self-pay | Admitting: Allergy

## 2019-08-16 ENCOUNTER — Other Ambulatory Visit: Payer: Self-pay

## 2019-08-16 ENCOUNTER — Encounter: Payer: Self-pay | Admitting: Family Medicine

## 2019-08-16 ENCOUNTER — Ambulatory Visit (INDEPENDENT_AMBULATORY_CARE_PROVIDER_SITE_OTHER): Payer: Managed Care, Other (non HMO) | Admitting: Family Medicine

## 2019-08-16 VITALS — BP 130/78 | HR 70 | Temp 98.4°F | Resp 12 | Ht 71.5 in | Wt 197.1 lb

## 2019-08-16 DIAGNOSIS — T2020XA Burn of second degree of head, face, and neck, unspecified site, initial encounter: Secondary | ICD-10-CM | POA: Diagnosis not present

## 2019-08-16 DIAGNOSIS — R600 Localized edema: Secondary | ICD-10-CM

## 2019-08-16 DIAGNOSIS — R0602 Shortness of breath: Secondary | ICD-10-CM | POA: Diagnosis not present

## 2019-08-16 MED ORDER — DOXYCYCLINE HYCLATE 100 MG PO TABS: 100.0000 mg | ORAL_TABLET | Freq: Two times a day (BID) | ORAL | 0 refills | Status: AC

## 2019-08-16 NOTE — Progress Notes (Signed)
Chief Complaint  Patient presents with  . eye swelling   HPI: [name] is a 59 y.o. female with hx of CKD,HTN,anemia here today with above complaint. She has had problem intermittent for a few months. Last week she had another episode,applied local ice for 15 min and burn lower eye lid, left eye Immunology evaluation is pending. A few days ago a transient episode of SOB in the middle of the night, she thought it was caused by a nightmare, lasted a couple of minutes.  Benadryl 25 mg helped.  Negative for fever,chills, visual changes,sore throat,cough,CP,abdominal pain,N/Vmfocal neurologic deficit. She is also reporting unintentional wt loss. She has not changed her diet or level of activity since 02/2019. She fasts 3 times per week and walks a few times per week for 30-60 minutes.  Lab Results  Component Value Date   TSH 0.51 11/24/2018   Lab Results  Component Value Date   CREATININE 1.00 07/02/2019   BUN 9 07/02/2019   NA 137 07/02/2019   K 3.7 07/02/2019   CL 107 07/02/2019   CO2 20 (L) 07/02/2019   Lab Results  Component Value Date   CRP <1.0 08/26/2018   Lab Results  Component Value Date   WBC 5.2 07/21/2019   HGB 9.2 (L) 07/21/2019   HCT 29.2 (L) 07/21/2019   MCV 92.4 07/21/2019   PLT 203 07/21/2019    Review of Systems  Constitutional: Negative for activity change, appetite change and fatigue.  HENT: Negative for mouth sores, nosebleeds and trouble swallowing.   Cardiovascular: Negative for palpitations and leg swelling.  Gastrointestinal:       Negative for changes in bowel habits.  Genitourinary: Negative for decreased urine volume and hematuria.  Musculoskeletal: Negative for gait problem and myalgias.  Neurological: Negative for syncope, weakness and headaches.  Psychiatric/Behavioral: Negative for confusion.  Rest see pertinent positives and negatives per HPI.  Current Outpatient Medications on File Prior to Visit  Medication Sig  Dispense Refill  . amLODipine (NORVASC) 5 MG tablet Take 0.5 tablets (2.5 mg total) by mouth daily. 45 tablet 1  . buPROPion (WELLBUTRIN XL) 150 MG 24 hr tablet Take 150 mg by mouth daily.    . ciprofloxacin (CIPRO) 500 MG tablet Take 1 tablet (500 mg total) by mouth 2 (two) times daily. 14 tablet 0  . escitalopram (LEXAPRO) 20 MG tablet Take 20 mg by mouth daily.      Marland Kitchen FOLIC ACID PO Take by mouth daily.    Marland Kitchen levothyroxine (SYNTHROID) 125 MCG tablet TAKE 1 TABLET BY MOUTH EVERY DAY 30 tablet 5  . omeprazole (PRILOSEC) 20 MG capsule TAKE 1 CAPSULE BY MOUTH EVERY DAY 30 capsule 5  . POTASSIUM PO Take by mouth daily.    . risperiDONE (RISPERDAL) 1 MG tablet Take 1 mg by mouth at bedtime.  1  . Vitamin D, Ergocalciferol, (DRISDOL) 1.25 MG (50000 UT) CAPS capsule Take 1 capsule (50,000 Units total) by mouth every 14 (fourteen) days. For 8 weeks. Then every 2 weeks. 6 capsule 3   No current facility-administered medications on file prior to visit.   Past Medical History:  Diagnosis Date  . Anemia   . Barrett esophagus   . Depression   . DUB (dysfunctional uterine bleeding)   . GERD (gastroesophageal reflux disease)   . Graves disease   . Hypothyroidism    Allergies  Allergen Reactions  . Milk-Related Compounds Other (See Comments)    Intolerance to milk products-causes gas  Social History   Socioeconomic History  . Marital status: Married    Spouse name: Not on file  . Number of children: 0  . Years of education: Not on file  . Highest education level: Not on file  Occupational History    Employer: [employer]  Tobacco Use  . Smoking status: Former Smoker    Packs/day: 1.00    Years: 20.00    Pack years: 20.00    Types: Cigarettes    Start date: 03/13/1967    Quit date: 07/14/1987    Years since quitting: 32.1  . Smokeless tobacco: Never Used  . Tobacco comment: quit 26 years ago  Vaping Use  . Vaping Use: Never used  Substance and Sexual Activity  . Alcohol use: No     Alcohol/week: 0.0 standard drinks  . Drug use: No  . Sexual activity: Not on file  Other Topics Concern  . Not on file  Social History Narrative   Occupation: Freight forwarder   Regular exercise- yes   0 caffeine drinks    Social Determinants of Radio broadcast assistant Strain:   . Difficulty of Paying Living Expenses:   Food Insecurity:   . Worried About Charity fundraiser in the Last Year:   . Arboriculturist in the Last Year:   Transportation Needs:   . Film/video editor (Medical):   Marland Kitchen Lack of Transportation (Non-Medical):   Physical Activity:   . Days of Exercise per Week:   . Minutes of Exercise per Session:   Stress:   . Feeling of Stress :   Social Connections:   . Frequency of Communication with Friends and Family:   . Frequency of Social Gatherings with Friends and Family:   . Attends Religious Services:   . Active Member of Clubs or Organizations:   . Attends Archivist Meetings:   Marland Kitchen Marital Status:    Vitals:   08/16/19 1409  BP: 130/78  Pulse: 70  Resp: 12  Temp: 98.4 F (36.9 C)  SpO2: 98%   Body mass index is 27.11 kg/m.  Physical Exam Vitals and nursing note reviewed.  Constitutional:      General: She is not in acute distress.    Appearance: She is well-developed.  HENT:     Head: Normocephalic and atraumatic.     Mouth/Throat:     Mouth: Mucous membranes are moist.     Pharynx: Oropharynx is clear.  Eyes:     Conjunctiva/sclera: Conjunctivae normal.     Pupils: Pupils are equal, round, and reactive to light.      Comments: Left periocular edema, more noticeable in lower eye lid. Flat blister and surrounded erythema. No induration, mild local heart. See picture.  Cardiovascular:     Rate and Rhythm: Normal rate and regular rhythm.     Heart sounds: No murmur heard.   Pulmonary:     Effort: Pulmonary effort is normal. No respiratory distress.     Breath sounds: Normal breath sounds.  Abdominal:     Palpations:  Abdomen is soft. There is no hepatomegaly or mass.     Tenderness: There is no abdominal tenderness.  Lymphadenopathy:     Cervical: No cervical adenopathy.  Skin:    General: Skin is warm.     Findings: No erythema or rash.  Neurological:     Mental Status: She is alert and oriented to person, place, and time.     Cranial Nerves: No cranial  nerve deficit.     Gait: Gait normal.  Psychiatric:     Comments: Well groomed, good eye contact.       ASSESSMENT AND PLAN:  Ms.Emme was seen today for eye swelling.  Diagnoses and all orders for this visit:  Lab Results  Component Value Date   TSH 0.43 08/16/2019   Lab Results  Component Value Date   CRP 1.8 08/16/2019   Facial edema No known etiology at this time. ? Allergic, Benadryl seems to help. Other possible causes discussed. Instructed about warning signs. Keep appt with immunologist.  -     DG Chest 2 View; Future -     C-reactive protein -     TSH  SOB (shortness of breath) On time episode. + Former smoker. Lung auscultation is normal today.  -     DG Chest 2 View; Future  Facial burn, second degree, initial encounter Recommend stopping local icing. Keep area clean with soap and water. Course of abx recommended, some side effects discussed.   Other orders -     doxycycline (VIBRA-TABS) 100 MG tablet; Take 1 tablet (100 mg total) by mouth 2 (two) times daily for 7 days.   Return if symptoms worsen or fail to improve.   Safal Halderman G. [name], MD  [facility]. [place] office.   A few things to remember from today's visit:  Facial edema - Plan: DG Chest 2 View, C-reactive protein, TSH  SOB (shortness of breath) - Plan: DG Chest 2 View  Facial burn, second degree, initial encounter  Keep appointment with immunologist. Avoid applying ice to your face in the future. Keep area clean with soap and water.   If you need refills please call your pharmacy. Do not use My Chart to request  refills or for acute issues that need immediate attention.    Please be sure medication list is accurate. If a new problem present, please set up appointment sooner than planned today.

## 2019-08-16 NOTE — Patient Instructions (Addendum)
A few things to remember from today's visit:  Facial edema - Plan: DG Chest 2 View, C-reactive protein, TSH  SOB (shortness of breath) - Plan: DG Chest 2 View  Facial burn, second degree, initial encounter  Keep appointment with immunologist. Avoid applying ice to your face in the future. Keep area clean with soap and water.   If you need refills please call your pharmacy. Do not use My Chart to request refills or for acute issues that need immediate attention.    Please be sure medication list is accurate. If a new problem present, please set up appointment sooner than planned today.

## 2019-08-17 ENCOUNTER — Ambulatory Visit (INDEPENDENT_AMBULATORY_CARE_PROVIDER_SITE_OTHER)
Admission: RE | Admit: 2019-08-17 | Discharge: 2019-08-17 | Disposition: A | Discharge status: Home / Self Care | Payer: Managed Care, Other (non HMO) | Source: Ambulatory Visit | Attending: Family Medicine | Admitting: Family Medicine

## 2019-08-17 DIAGNOSIS — R0602 Shortness of breath: Secondary | ICD-10-CM

## 2019-08-17 DIAGNOSIS — R600 Localized edema: Secondary | ICD-10-CM | POA: Diagnosis not present

## 2019-08-17 LAB — TSH: TSH: 0.43 mIU/L (ref 0.40–4.50)

## 2019-08-17 LAB — C-REACTIVE PROTEIN: CRP: 1.8 mg/L (ref <8.0)

## 2019-08-27 ENCOUNTER — Ambulatory Visit: Payer: Managed Care, Other (non HMO) | Admitting: Hematology and Oncology

## 2019-08-31 NOTE — Progress Notes (Addendum)
Patient Care Team: [name], [name], MD as PCP - General (Family Medicine)  DIAGNOSIS:    ICD-10-CM   1. Anemia of renal disease  N18.9    D63.1     CHIEF COMPLIANT: Follow-up of anemia  INTERVAL HISTORY: Karen [name] is a 58 y.o. with above-mentioned history of anemiaon treatment with Retacrit. She presents to the clinic today for follow-up.   Swelling underneath the left eye with erythema.  She showed me pictures of what it looked like before.  I suspect that she may have had shingles and it has resolved.  She is seeing a dermatologist tomorrow.  ALLERGIES:  is allergic to milk-related compounds.  MEDICATIONS:  Current Outpatient Medications  Medication Sig Dispense Refill  . amLODipine (NORVASC) 5 MG tablet Take 0.5 tablets (2.5 mg total) by mouth daily. 45 tablet 1  . buPROPion (WELLBUTRIN XL) 150 MG 24 hr tablet Take 150 mg by mouth daily.    Marland Kitchen escitalopram (LEXAPRO) 20 MG tablet Take 20 mg by mouth daily.      Marland Kitchen FOLIC ACID PO Take by mouth daily.    Marland Kitchen levothyroxine (SYNTHROID) 125 MCG tablet TAKE 1 TABLET BY MOUTH EVERY DAY 30 tablet 5  . omeprazole (PRILOSEC) 20 MG capsule TAKE 1 CAPSULE BY MOUTH EVERY DAY 30 capsule 5  . POTASSIUM PO Take by mouth daily.    . risperiDONE (RISPERDAL) 1 MG tablet Take 1 mg by mouth at bedtime.  1  . Vitamin D, Ergocalciferol, (DRISDOL) 1.25 MG (50000 UT) CAPS capsule Take 1 capsule (50,000 Units total) by mouth every 14 (fourteen) days. For 8 weeks. Then every 2 weeks. 6 capsule 3   No current facility-administered medications for this visit.    PHYSICAL EXAMINATION: ECOG PERFORMANCE STATUS: 1 - Symptomatic but completely ambulatory  Vitals:   09/01/19 1137  BP: (!) 114/60  Pulse: 72  Resp: 20  Temp: 98.5 F (36.9 C)  SpO2: 100%   Filed Weights   09/01/19 1137  Weight: 178 lb 9.6 oz (81 kg)    LABORATORY DATA:  I have reviewed the data as listed CMP Latest Ref Rng & Units 07/02/2019 05/18/2019 05/03/2019  Glucose 70 - 99  mg/dL 86 89 86  BUN 6 - 20 mg/dL '9 12 11  ' Creatinine 0.44 - 1.00 mg/dL 1.00 0.97 1.13(H)  Sodium 135 - 145 mmol/L 137 134(L) 142  Potassium 3.5 - 5.1 mmol/L 3.7 4.1 4.0  Chloride 98 - 111 mmol/L 107 101 108  CO2 22 - 32 mmol/L 20(L) 27 26  Calcium 8.9 - 10.3 mg/dL 9.0 9.1 9.4  Total Protein 6.5 - 8.1 g/dL 7.2 - 7.5  Total Bilirubin 0.3 - 1.2 mg/dL 0.5 - 0.5  Alkaline Phos 38 - 126 U/L 92 - 84  AST 15 - 41 U/L 29 - 19  ALT 0 - 44 U/L 21 - 12    Lab Results  Component Value Date   WBC 2.2 (L) 09/01/2019   HGB 9.0 (L) 09/01/2019   HCT 27.9 (L) 09/01/2019   MCV 91.2 09/01/2019   PLT 187 09/01/2019   NEUTROABS 1.2 (L) 09/01/2019    ASSESSMENT & PLAN:  Anemia of renal disease Previous iron infusion: June 2018 ESA therapy: Previously received Aranesp 2-3 times per year from 2014(with Dr. Marin Olp) Current treatment: Retacrit 20,000 units monthly started 10/02/2018(2 doses received so far) goal: Hemoglobin of 10 or above  Bone marrow biopsy performed because of anemia and leukopenia: Normocellular bone marrow with trilineage hematopoiesis with  mild megakaryocytic atypia  Lab review: 07/21/2019: Hemoglobin 9.2, WBC was 5.2 and platelet count 203 09/01/2019: Hb 9, WBC 2.2 ANC 1.2  Swelling underneath the left eye: With erythema and skin reaction: Much improved today.  She is seeing dermatologist.  I reviewed the pictures that she showed me and it look like shingles to me.  She tells me that she had shingles vaccine previously.  Return to clinicevery 2 months for Retacrit injections.On 40,000 units every 2 months RTC in 6 months with labs   No orders of the defined types were placed in this encounter.  The patient has a good understanding of the overall plan. she agrees with it. she will call with any problems that may develop before the next visit here.  Total time spent: 20 mins including face to face time and time spent for planning, charting and coordination of  care  Nicholas Lose, MD 09/01/2019  I, Cloyde Reams Dorshimer, am acting as scribe for Dr. Nicholas Lose.  I have reviewed the above documentation for accuracy and completeness, and I agree with the above.

## 2019-09-01 ENCOUNTER — Other Ambulatory Visit: Payer: Self-pay

## 2019-09-01 ENCOUNTER — Inpatient Hospital Stay: Payer: Managed Care, Other (non HMO) | Attending: Hematology & Oncology

## 2019-09-01 ENCOUNTER — Inpatient Hospital Stay: Payer: Managed Care, Other (non HMO)

## 2019-09-01 ENCOUNTER — Inpatient Hospital Stay (HOSPITAL_BASED_OUTPATIENT_CLINIC_OR_DEPARTMENT_OTHER): Payer: Managed Care, Other (non HMO) | Admitting: Hematology and Oncology

## 2019-09-01 DIAGNOSIS — N189 Chronic kidney disease, unspecified: Secondary | ICD-10-CM

## 2019-09-01 DIAGNOSIS — D631 Anemia in chronic kidney disease: Secondary | ICD-10-CM | POA: Diagnosis present

## 2019-09-01 DIAGNOSIS — D500 Iron deficiency anemia secondary to blood loss (chronic): Secondary | ICD-10-CM

## 2019-09-01 LAB — CBC WITH DIFFERENTIAL (CANCER CENTER ONLY)
Abs Immature Granulocytes: 0 10*3/uL (ref 0.00–0.07)
Basophils Absolute: 0 10*3/uL (ref 0.0–0.1)
Basophils Relative: 1 %
Eosinophils Absolute: 0 10*3/uL (ref 0.0–0.5)
Eosinophils Relative: 1 %
HCT: 27.9 % — ABNORMAL LOW (ref 36.0–46.0)
Hemoglobin: 9 g/dL — ABNORMAL LOW (ref 12.0–15.0)
Immature Granulocytes: 0 %
Lymphocytes Relative: 33 %
Lymphs Abs: 0.7 10*3/uL (ref 0.7–4.0)
MCH: 29.4 pg (ref 26.0–34.0)
MCHC: 32.3 g/dL (ref 30.0–36.0)
MCV: 91.2 fL (ref 80.0–100.0)
Monocytes Absolute: 0.3 10*3/uL (ref 0.1–1.0)
Monocytes Relative: 12 %
Neutro Abs: 1.2 10*3/uL — ABNORMAL LOW (ref 1.7–7.7)
Neutrophils Relative %: 53 %
Platelet Count: 187 10*3/uL (ref 150–400)
RBC: 3.06 MIL/uL — ABNORMAL LOW (ref 3.87–5.11)
RDW: 12.7 % (ref 11.5–15.5)
WBC Count: 2.2 10*3/uL — ABNORMAL LOW (ref 4.0–10.5)
nRBC: 0 % (ref 0.0–0.2)

## 2019-09-01 LAB — FERRITIN: Ferritin: 534 ng/mL — ABNORMAL HIGH (ref 11–307)

## 2019-09-01 LAB — CMP (CANCER CENTER ONLY)
ALT: 48 U/L — ABNORMAL HIGH (ref 0–44)
AST: 106 U/L — ABNORMAL HIGH (ref 15–41)
Albumin: 4 g/dL (ref 3.5–5.0)
Alkaline Phosphatase: 77 U/L (ref 38–126)
Anion gap: 14 (ref 5–15)
BUN: 11 mg/dL (ref 6–20)
CO2: 19 mmol/L — ABNORMAL LOW (ref 22–32)
Calcium: 10 mg/dL (ref 8.9–10.3)
Chloride: 100 mmol/L (ref 98–111)
Creatinine: 1.48 mg/dL — ABNORMAL HIGH (ref 0.44–1.00)
GFR, Est AFR Am: 44 mL/min — ABNORMAL LOW (ref >60)
GFR, Estimated: 38 mL/min — ABNORMAL LOW (ref >60)
Glucose, Bld: 64 mg/dL — ABNORMAL LOW (ref 70–99)
Potassium: 4.1 mmol/L (ref 3.5–5.1)
Sodium: 133 mmol/L — ABNORMAL LOW (ref 135–145)
Total Bilirubin: 1.1 mg/dL (ref 0.3–1.2)
Total Protein: 7.5 g/dL (ref 6.5–8.1)

## 2019-09-01 LAB — IRON AND TIBC
Iron: 52 ug/dL (ref 41–142)
Saturation Ratios: 24 % (ref 21–57)
TIBC: 215 ug/dL — ABNORMAL LOW (ref 236–444)
UIBC: 163 ug/dL (ref 120–384)

## 2019-09-01 MED ORDER — EPOETIN ALFA-EPBX 40000 UNIT/ML IJ SOLN
40000.0000 [IU] | Freq: Once | INTRAMUSCULAR | Status: AC
  Administered 2019-09-01: 40000 [IU] via SUBCUTANEOUS

## 2019-09-01 NOTE — Patient Instructions (Signed)

## 2019-09-01 NOTE — Assessment & Plan Note (Signed)
Previous iron infusion: June 2018 ESA therapy: Previously received Aranesp 2-3 times per year from 2014(with Dr. Marin Olp) Current treatment: Retacrit 20,000 units monthly started 10/02/2018(2 doses received so far) goal: Hemoglobin of 10 or above  Bone marrow biopsy performed because of anemia and leukopenia: Normocellular bone marrow with trilineage hematopoiesis with mild megakaryocytic atypia  Lab review: 07/21/2019: Hemoglobin 9.2, WBC was 5.2 and platelet count 203 09/01/2019:   Return to clinicevery 2 months for Retacrit injections.We will increase the dosage to 40,000 units every 2 months

## 2019-09-02 ENCOUNTER — Encounter: Payer: Self-pay | Admitting: Allergy

## 2019-09-02 ENCOUNTER — Ambulatory Visit (INDEPENDENT_AMBULATORY_CARE_PROVIDER_SITE_OTHER): Payer: Managed Care, Other (non HMO) | Admitting: Allergy

## 2019-09-02 VITALS — BP 86/64 | HR 88 | Temp 97.8°F | Ht 69.0 in | Wt 178.4 lb

## 2019-09-02 DIAGNOSIS — T783XXD Angioneurotic edema, subsequent encounter: Secondary | ICD-10-CM | POA: Diagnosis not present

## 2019-09-02 DIAGNOSIS — L508 Other urticaria: Secondary | ICD-10-CM | POA: Diagnosis not present

## 2019-09-02 NOTE — Progress Notes (Signed)
New Patient Note  RE: Karen [name] MRN: [id number] DOB: Jul 02, 1960 Date of Office Visit: 09/02/2019  Referring provider: [name], [name], MD Primary care provider: [name], [name], MD  Chief Complaint: eye swelling  History of present illness: Karen [name] is a 59 y.o. female presenting today for consultation for facial edema.    She states she has been having hives since Jan 2021 and she also has been having swelling around her eye.  She has had lip swelling as well.  She states while at school she developed with hives and states was "really bad" in Jan 2021 and she did see her PCP at that time.  She was taking Zyrtec 1 tablet a day but states it was not helping and that she stopped taking it about 3 weeks ago.  She has been using Benadryl as needed instead. She states her skin can feel warm before the hives pop-up.  She has had episodes of hives since the onset on several occasions.  Hives did not leave marks or bruising. No joint aches/pains.  No fevers. No stings.  No change in body products.  She states the amlodipine w is the only as new medication she has taken however it was started after after the onset of hives.  She has not been able to identify any consistent things in regards to the onset of hives and swelling. With her eye swelling she states she was using a frozen water bottle as a compress and developed a bone blister that is still healing.  She saw her PCP for this who prescribed a course of doxycycline.   There was some concern that her Epoetin alfa injection that she gets to maintain her hemoglobin level may be causing the facial swelling that she states that her facial swelling is listed as a side effect.  She discussed this with her hematologist who did not feel it was related.  She states she has been getting his infusions every 2 to 3 months for years.  She does not report increase in hives or swelling episodes shortly after receiving these infusions.  She states she  does avoid milk/dairy as it causes extreme abdominal pain.  Even lactose free milk causes abdominal pain.   No history of eczema, food allergy, asthma or seasonal/perenial allergic rhinitis/conjunctivitis.    She is on a 21 day fast and she is on day 14.  She states her BP usually goes down during these fasts.   Review of systems: Review of Systems  Constitutional: Negative.   HENT: Negative.   Eyes: Negative.   Respiratory: Negative.   Cardiovascular: Negative.   Gastrointestinal: Negative.   Musculoskeletal: Negative.   Skin:       See HPI  Neurological: Negative.     All other systems negative unless noted above in HPI  Past medical history: Past Medical History:  Diagnosis Date   Anemia    Barrett esophagus    Depression    DUB (dysfunctional uterine bleeding)    GERD (gastroesophageal reflux disease)    Graves disease    Hypothyroidism     Past surgical history: Past Surgical History:  Procedure Laterality Date   CHOLECYSTECTOMY N/A 07/16/2012   Procedure: LAPAROSCOPIC CHOLECYSTECTOMY WITH INTRAOPERATIVE CHOLANGIOGRAM;  Surgeon: Joyice Faster. Cornett, MD;  Location: [facility];  Service: General;  Laterality: N/A;   COLONOSCOPY     FOOT SURGERY     Left   UPPER GASTROINTESTINAL ENDOSCOPY      Family  history:  Family History  Problem Relation Age of Onset   Hypertension Mother    Asthma Mother    Cancer Father 74       larynx cancer    Breast cancer Maternal Aunt 77   Colon cancer Neg Hx    Colon polyps Neg Hx    Esophageal cancer Neg Hx    Rectal cancer Neg Hx    Stomach cancer Neg Hx     Social history: Lives in a home with out carpeting with gas heating and central cooling.  No pets in the home.  There is no concern for water damage, mildew or roaches in the home.  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  Medication List: Current Outpatient Medications  Medication Sig  Dispense Refill   amLODipine (NORVASC) 5 MG tablet Take 0.5 tablets (2.5 mg total) by mouth daily. 45 tablet 1   buPROPion (WELLBUTRIN XL) 150 MG 24 hr tablet Take 150 mg by mouth daily.     escitalopram (LEXAPRO) 20 MG tablet Take 20 mg by mouth daily.       FOLIC ACID PO Take by mouth daily.     levothyroxine (SYNTHROID) 125 MCG tablet TAKE 1 TABLET BY MOUTH EVERY DAY 30 tablet 5   omeprazole (PRILOSEC) 20 MG capsule TAKE 1 CAPSULE BY MOUTH EVERY DAY 30 capsule 5   POTASSIUM PO Take by mouth daily.     risperiDONE (RISPERDAL) 1 MG tablet Take 1 mg by mouth at bedtime.  1   Vitamin D, Ergocalciferol, (DRISDOL) 1.25 MG (50000 UT) CAPS capsule Take 1 capsule (50,000 Units total) by mouth every 14 (fourteen) days. For 8 weeks. Then every 2 weeks. 6 capsule 3   No current facility-administered medications for this visit.    Known medication allergies: Allergies  Allergen Reactions   Milk-Related Compounds Other (See Comments)    Intolerance to milk products-causes gas     Physical examination: Blood pressure (!) 82/54, pulse 88, temperature 97.8 F (36.6 C), height 5\' 9"  (1.753 m), weight 178 lb 6.4 oz (80.9 kg), SpO2 96 %.  General: Alert, interactive, in no acute distress. HEENT: PERRLA, left lower lid edematous with a purplish discoloration, TMs pearly gray, turbinates non-edematous without discharge, post-pharynx non erythematous. Neck: Supple without lymphadenopathy. Lungs: Clear to auscultation without wheezing, rhonchi or rales. {no increased work of breathing. CV: Normal S1, S2 without murmurs. Abdomen: Nondistended, nontender. Skin: Warm and dry, without lesions or rashes. Extremities:  No clubbing, cyanosis or edema. Neuro:   Grossly intact.  Diagnositics/Labs:  Allergy testing: deferred due ongoing angioedema  Assessment and plan: Chronic urticaria with angioedema  - at this time etiology of hives and swelling is unknown.  Hives can be caused by a variety  of different triggers including illness/infection, foods, medications, stings, exercise, pressure, vibrations, extremes of temperature to name a few however majority of the time there is no identifiable trigger.  Your symptoms have been ongoing for >6 weeks making this chronic thus will obtain labwork to evaluate: CMP, tryptase, hive panel, environmental panel, alpha-gal panel, inflammatory markers, ANA  - to help with hive and swelling management recommend taking the following antihistamine regimen: Zyrtec 10mg  1 tab twice a day with Pepcid 20mg  1 tab twice a day  - if the above regimen is not effective in managing hive/swelling episodes then Singulair may be added to this regimen  - if the above regimen  is not effective in managing hive/swelling episodes then Xolair monthly injections may be initiated.  We can discuss Xolair if needed in future  - should significant symptoms recur or new symptoms occur, a journal is to be kept recording any foods eaten, beverages consumed, medications taken, activities performed, and environmental conditions within a 6 hour time period prior to the onset of symptoms.  Follow-up in 2-3 months or sooner if needed  I appreciate the opportunity to take part in Enid's care. Please do not hesitate to contact me with questions.  Sincerely,   Prudy Feeler, MD Allergy/Immunology [facility]

## 2019-09-02 NOTE — Patient Instructions (Addendum)
Hives and Swelling  - at this time etiology of hives and swelling is unknown.  Hives can be caused by a variety of different triggers including illness/infection, foods, medications, stings, exercise, pressure, vibrations, extremes of temperature to name a few however majority of the time there is no identifiable trigger.  Your symptoms have been ongoing for >6 weeks making this chronic thus will obtain labwork to evaluate: CMP, tryptase, hive panel, environmental panel, alpha-gal panel, inflammatory markers, ANA  - to help with hive and swelling management recommend taking the following antihistamine regimen: Zyrtec 10mg  1 tab twice a day with Pepcid 20mg  1 tab twice a day  - if the above regimen is not effective in managing hive/swelling episodes then Singulair may be added to this regimen  - if the above regimen is not effective in managing hive/swelling episodes then Xolair monthly injections may be initiated.  We can discuss Xolair if needed in future  - should significant symptoms recur or new symptoms occur, a journal is to be kept recording any foods eaten, beverages consumed, medications taken, activities performed, and environmental conditions within a 6 hour time period prior to the onset of symptoms.  Follow-up in 2-3 months or sooner if needed

## 2019-09-10 ENCOUNTER — Other Ambulatory Visit: Payer: Self-pay | Admitting: Family Medicine

## 2019-09-11 LAB — SEDIMENTATION RATE: Sed Rate: 55 mm/hr — ABNORMAL HIGH (ref 0–40)

## 2019-09-11 LAB — ENA+DNA/DS+SJORGEN'S
ENA RNP Ab: 0.2 AI (ref 0.0–0.9)
ENA SM Ab Ser-aCnc: 0.3 AI (ref 0.0–0.9)
ENA SSA (RO) Ab: 7.8 AI — ABNORMAL HIGH (ref 0.0–0.9)
ENA SSB (LA) Ab: <0.2 AI (ref 0.0–0.9)
dsDNA Ab: 5 IU/mL (ref 0–9)

## 2019-09-11 LAB — ALLERGENS W/TOTAL IGE AREA 2

## 2019-09-11 LAB — ALPHA-GAL PANEL
Alpha Gal IgE*: <0.1 kU/L (ref <0.10)
Beef (Bos spp) IgE: <0.1 kU/L (ref <0.35)
Class Interpretation: 0
Class Interpretation: 0
Class Interpretation: 0
Lamb/Mutton (Ovis spp) IgE: <0.1 kU/L (ref <0.35)
Pork (Sus spp) IgE: <0.1 kU/L (ref <0.35)

## 2019-09-11 LAB — CHRONIC URTICARIA: cu index: 4.7 (ref <10)

## 2019-09-11 LAB — THYROGLOBULIN ANTIBODY: Thyroglobulin Antibody: <1 IU/mL (ref 0.0–0.9)

## 2019-09-11 LAB — COMPREHENSIVE METABOLIC PANEL
ALT: 48 IU/L — ABNORMAL HIGH (ref 0–32)
AST: 105 IU/L — ABNORMAL HIGH (ref 0–40)
Albumin/Globulin Ratio: 1.3 (ref 1.2–2.2)
Albumin: 4.3 g/dL (ref 3.8–4.9)
Alkaline Phosphatase: 87 IU/L (ref 48–121)
BUN/Creatinine Ratio: 7 — ABNORMAL LOW (ref 12–28)
BUN: 9 mg/dL (ref 8–27)
Bilirubin Total: 0.6 mg/dL (ref 0.0–1.2)
CO2: 19 mmol/L — ABNORMAL LOW (ref 20–29)
Calcium: 9.7 mg/dL (ref 8.7–10.3)
Chloride: 98 mmol/L (ref 96–106)
Creatinine, Ser: 1.34 mg/dL — ABNORMAL HIGH (ref 0.57–1.00)
GFR calc Af Amer: 50 mL/min/{1.73_m2} — ABNORMAL LOW (ref >59)
GFR calc non Af Amer: 43 mL/min/{1.73_m2} — ABNORMAL LOW (ref >59)
Globulin, Total: 3.4 g/dL (ref 1.5–4.5)
Glucose: 68 mg/dL (ref 65–99)
Potassium: 4.3 mmol/L (ref 3.5–5.2)
Sodium: 138 mmol/L (ref 134–144)
Total Protein: 7.7 g/dL (ref 6.0–8.5)

## 2019-09-11 LAB — THYROID PEROXIDASE ANTIBODY: Thyroperoxidase Ab SerPl-aCnc: <8 IU/mL (ref 0–34)

## 2019-09-11 LAB — ANA W/REFLEX: Anti Nuclear Antibody (ANA): POSITIVE — AB

## 2019-09-11 LAB — TRYPTASE: Tryptase: 5.2 ug/L (ref 2.2–13.2)

## 2019-09-14 ENCOUNTER — Telehealth: Payer: Self-pay | Admitting: Family Medicine

## 2019-09-14 DIAGNOSIS — R768 Other specified abnormal immunological findings in serum: Secondary | ICD-10-CM

## 2019-09-14 NOTE — Telephone Encounter (Signed)
Please see the allergists note.

## 2019-09-14 NOTE — Telephone Encounter (Signed)
Pt is calling in stating the she would like to know what she should do going forward after seeing the allergist.

## 2019-09-15 ENCOUNTER — Ambulatory Visit: Payer: Self-pay | Admitting: Allergy

## 2019-09-15 NOTE — Telephone Encounter (Signed)
ATC x 1. I don't see where rheumatology referral was placed, we can enter it if pt would like to move forward.

## 2019-09-15 NOTE — Telephone Encounter (Signed)
My understanding is that Dr Bary Leriche is going to refer her to rheumatologist because abnormal tests. If Dr Bary Leriche recommended follow up, she can continue doing so. She needs to let us know if she does not receive information about rheumatology appt in 1 to 2 weeks. Thanks, BJ

## 2019-09-16 ENCOUNTER — Telehealth (INDEPENDENT_AMBULATORY_CARE_PROVIDER_SITE_OTHER): Payer: Managed Care, Other (non HMO) | Admitting: Family Medicine

## 2019-09-16 ENCOUNTER — Encounter: Payer: Self-pay | Admitting: Family Medicine

## 2019-09-16 VITALS — Wt 173.0 lb

## 2019-09-16 DIAGNOSIS — M79672 Pain in left foot: Secondary | ICD-10-CM | POA: Diagnosis not present

## 2019-09-16 DIAGNOSIS — R2242 Localized swelling, mass and lump, left lower limb: Secondary | ICD-10-CM

## 2019-09-16 DIAGNOSIS — R899 Unspecified abnormal finding in specimens from other organs, systems and tissues: Secondary | ICD-10-CM

## 2019-09-16 NOTE — Patient Instructions (Signed)
-  seek inperson orthopedic evaluation today at the [facility] urgent care or the [facility]. If you can not be seen there than please seek in-person care at the [facility] immediately.  -please obtain a copy of the abnormal laboratory result and follow up or consult your primary care doctor regarding the results

## 2019-09-16 NOTE — Progress Notes (Signed)
Virtual Visit via Video Note  I connected with Karen [name]  on 09/16/19 at 12:20 PM EDT by a video enabled telemedicine application and verified that I am speaking with the correct person using two identifiers.  Location patient: home Location provider:work or home office Persons participating in the virtual visit: patient, provider  I discussed the limitations of evaluation and management by telemedicine and the availability of in person appointments. The patient expressed understanding and agreed to proceed.   HPI:  Acute visit for foot swelling: -started yesterday -pain and lump the size of a "golf ball" on L plantar heal - hurts and feels like a knot when she first steps out of bed, swelling/knot that resolves some as the day goes on, but with persistent pain and now redness spreading along the foot -has hx anemia of renal disease, on abx from opthomologist for facial infection - keflex, hypothyroidism, Barrett esophagus -reports did some tests with allergist and the results came back yesterday with one abnormal rheum labs - waiting on a call back from allergist to explain better and they will send to PCP -denies fevers, malaise, redness elsewhere other than face   ROS: See pertinent positives and negatives per HPI.  Past Medical History:  Diagnosis Date  . Anemia   . Barrett esophagus   . Depression   . DUB (dysfunctional uterine bleeding)   . GERD (gastroesophageal reflux disease)   . Graves disease   . Hypothyroidism     Past Surgical History:  Procedure Laterality Date  . CHOLECYSTECTOMY N/A 07/16/2012   Procedure: LAPAROSCOPIC CHOLECYSTECTOMY WITH INTRAOPERATIVE CHOLANGIOGRAM;  Surgeon: Joyice Faster. Cornett, MD;  Location: [facility];  Service: General;  Laterality: N/A;  . COLONOSCOPY    . FOOT SURGERY     Left  . UPPER GASTROINTESTINAL ENDOSCOPY      Family History  Problem Relation Age of Onset  . Hypertension Mother   . Asthma Mother   . Cancer Father 20       larynx  cancer   . Breast cancer Maternal Aunt 60  . Colon cancer Neg Hx   . Colon polyps Neg Hx   . Esophageal cancer Neg Hx   . Rectal cancer Neg Hx   . Stomach cancer Neg Hx     SOCIAL HX: see hpi   Current Outpatient Medications:  .  amLODipine (NORVASC) 5 MG tablet, Take 0.5 tablets (2.5 mg total) by mouth daily., Disp: 45 tablet, Rfl: 1 .  buPROPion (WELLBUTRIN XL) 150 MG 24 hr tablet, Take 150 mg by mouth daily., Disp: , Rfl:  .  cephALEXin (KEFLEX) 500 MG capsule, Take 500 mg by mouth 2 (two) times daily., Disp: , Rfl:  .  escitalopram (LEXAPRO) 20 MG tablet, Take 20 mg by mouth daily.  , Disp: , Rfl:  .  FOLIC ACID PO, Take by mouth daily., Disp: , Rfl:  .  levothyroxine (SYNTHROID) 125 MCG tablet, TAKE 1 TABLET BY MOUTH EVERY DAY, Disp: 30 tablet, Rfl: 5 .  omeprazole (PRILOSEC) 20 MG capsule, TAKE 1 CAPSULE BY MOUTH EVERY DAY, Disp: 30 capsule, Rfl: 5 .  POTASSIUM PO, Take by mouth daily., Disp: , Rfl:  .  risperiDONE (RISPERDAL) 1 MG tablet, Take 1 mg by mouth at bedtime., Disp: , Rfl: 1 .  Vitamin D, Ergocalciferol, (DRISDOL) 1.25 MG (50000 UNIT) CAPS capsule, TAKE 1 CAPSULE BY MOUTH EVERY 14 (FOURTEEN) DAYS. FOR 8 WEEKS. THEN EVERY 2 WEEKS., Disp: 2 capsule, Rfl: 11  EXAM:  VITALS per  patient if applicable:  GENERAL: alert, oriented, appears well and in no acute distress  HEENT: atraumatic, conjunttiva clear, no obvious abnormalities on inspection of external nose and ears  NECK: normal movements of the head and neck  LUNGS: on inspection no signs of respiratory distress, breathing rate appears normal, no obvious gross SOB, gasping or wheezing  CV: no obvious cyanosis  MS: she has difficulty positioning the camera so that I can evaluate her foot well - however I can appreciated a sharply demarcated area of erythema on the post plantar surface of her foot where she is having discomfort  PSYCH/NEURO: pleasant and cooperative, no obvious depression or anxiety, speech and  thought processing grossly intact  ASSESSMENT AND PLAN:  Discussed the following assessment and plan:  Left foot pain  Mass of left foot  Abnormal laboratory test result  -we discussed possible serious and likely etiologies, options for evaluation and workup, limitations of telemedicine visit vs in person visit, treatment, treatment risks and precautions. Odd symptoms with appreciable area of erythema on the plantar foot. Because of this advised inperson ortho care today and advised her of sever orthopedic urgent options at [facility] and [facility]. She agrees to seek care there. Also advised to obtain copy of the abnormal rheum lab and schedule follow up or communicate this to PCP. > 20 minutes spent on this appt. Patient agrees to seek prompt in person care if worsening, new symptoms arise, or if is not improving with treatment.   I discussed the assessment and treatment plan with the patient. The patient was provided an opportunity to ask questions and all were answered. The patient agreed with the plan and demonstrated an understanding of the instructions.   The patient was advised to call back or seek an in-person evaluation if the symptoms worsen or if the condition fails to improve as anticipated.   Karen Kern, DO

## 2019-09-21 NOTE — Telephone Encounter (Signed)
I spoke with patient. She would like to move forward with referral. Referral has been entered and advised patient to let me know if she doesn't hear from them within 1-2 weeks. Patient verbalized understanding.

## 2019-09-21 NOTE — Addendum Note (Signed)
Addended by: Rodrigo Ran on: 09/21/2019 09:24 AM   Modules accepted: Orders

## 2019-09-27 ENCOUNTER — Telehealth: Payer: Self-pay | Admitting: Family Medicine

## 2019-09-27 DIAGNOSIS — R768 Other specified abnormal immunological findings in serum: Secondary | ICD-10-CM

## 2019-09-27 NOTE — Telephone Encounter (Signed)
New referral placed to [facility].

## 2019-09-27 NOTE — Telephone Encounter (Signed)
Faxed notes & referral over to [facility].

## 2019-09-27 NOTE — Telephone Encounter (Signed)
I left patient a voicemail letting her know that everything has been faxed over & to call back if she needs anything else.

## 2019-09-27 NOTE — Telephone Encounter (Signed)
Pt called back and said the [place] office will see her if a second referral is sent and it's an emergency. They said to call the office at [phone]

## 2019-09-27 NOTE — Telephone Encounter (Signed)
Pt said she was referred to rheumatologist and they are not seeing anyone until February and she needs to see someone sooner.  Please call pt at [phone]  Please

## 2019-09-29 NOTE — Telephone Encounter (Signed)
Pt stated it would be a few weeks before a doctor can look at her situation but pt wants to be seen earlier so they gave her [place].   Pt can be reached at [phone]

## 2019-10-29 ENCOUNTER — Other Ambulatory Visit: Payer: Self-pay | Admitting: *Deleted

## 2019-10-29 ENCOUNTER — Inpatient Hospital Stay: Payer: Managed Care, Other (non HMO)

## 2019-10-29 ENCOUNTER — Other Ambulatory Visit: Payer: Self-pay

## 2019-10-29 ENCOUNTER — Telehealth: Payer: Self-pay

## 2019-10-29 ENCOUNTER — Inpatient Hospital Stay: Payer: Managed Care, Other (non HMO) | Attending: Hematology & Oncology

## 2019-10-29 ENCOUNTER — Telehealth: Payer: Self-pay | Admitting: *Deleted

## 2019-10-29 VITALS — BP 120/64 | HR 58 | Temp 98.3°F | Resp 18

## 2019-10-29 DIAGNOSIS — N189 Chronic kidney disease, unspecified: Secondary | ICD-10-CM

## 2019-10-29 DIAGNOSIS — D631 Anemia in chronic kidney disease: Secondary | ICD-10-CM

## 2019-10-29 DIAGNOSIS — D500 Iron deficiency anemia secondary to blood loss (chronic): Secondary | ICD-10-CM

## 2019-10-29 LAB — CBC WITH DIFFERENTIAL (CANCER CENTER ONLY)
Abs Immature Granulocytes: 0 10*3/uL (ref 0.00–0.07)
Basophils Absolute: 0 10*3/uL (ref 0.0–0.1)
Basophils Relative: 1 %
Eosinophils Absolute: 0 10*3/uL (ref 0.0–0.5)
Eosinophils Relative: 1 %
HCT: 29.1 % — ABNORMAL LOW (ref 36.0–46.0)
Hemoglobin: 9.1 g/dL — ABNORMAL LOW (ref 12.0–15.0)
Immature Granulocytes: 0 %
Lymphocytes Relative: 35 %
Lymphs Abs: 1 10*3/uL (ref 0.7–4.0)
MCH: 28.7 pg (ref 26.0–34.0)
MCHC: 31.3 g/dL (ref 30.0–36.0)
MCV: 91.8 fL (ref 80.0–100.0)
Monocytes Absolute: 0.4 10*3/uL (ref 0.1–1.0)
Monocytes Relative: 14 %
Neutro Abs: 1.4 10*3/uL — ABNORMAL LOW (ref 1.7–7.7)
Neutrophils Relative %: 49 %
Platelet Count: 204 10*3/uL (ref 150–400)
RBC: 3.17 MIL/uL — ABNORMAL LOW (ref 3.87–5.11)
RDW: 14.6 % (ref 11.5–15.5)
WBC Count: 2.9 10*3/uL — ABNORMAL LOW (ref 4.0–10.5)
nRBC: 0 % (ref 0.0–0.2)

## 2019-10-29 LAB — VITAMIN B12: Vitamin B-12: 290 pg/mL (ref 180–914)

## 2019-10-29 LAB — IRON AND TIBC
Iron: 45 ug/dL (ref 41–142)
Saturation Ratios: 19 % — ABNORMAL LOW (ref 21–57)
TIBC: 244 ug/dL (ref 236–444)
UIBC: 198 ug/dL (ref 120–384)

## 2019-10-29 LAB — FERRITIN: Ferritin: 259 ng/mL (ref 11–307)

## 2019-10-29 MED ORDER — EPOETIN ALFA-EPBX 40000 UNIT/ML IJ SOLN
INTRAMUSCULAR | Status: AC
  Filled 2019-10-29: qty 1

## 2019-10-29 MED ORDER — EPOETIN ALFA-EPBX 40000 UNIT/ML IJ SOLN: 40000.0000 [IU] | INTRAMUSCULAR | Status: DC

## 2019-10-29 MED ORDER — EPOETIN ALFA-EPBX 40000 UNIT/ML IJ SOLN
40000.0000 [IU] | Freq: Once | INTRAMUSCULAR | Status: AC
  Administered 2019-10-29: 40000 [IU] via SUBCUTANEOUS

## 2019-10-29 NOTE — Patient Instructions (Signed)

## 2019-10-29 NOTE — Telephone Encounter (Signed)
Pt. [facility] [phone] Attn Will DFaar PA_C Pt requested CBC and CMP informed Pt that a CMP was not drawn today.  And I will fax over the CBC with diff. Pt verbalized understanding.

## 2019-10-29 NOTE — Telephone Encounter (Signed)
Called [facility], gave patient information and verbal order for Retacrit 40,000 to be delivered to pt home along with syringe kit with 3 refills.

## 2019-11-01 NOTE — Addendum Note (Signed)
Addended by: Tora Kindred on: 11/01/2019 04:47 PM   Modules accepted: Orders

## 2019-11-07 ENCOUNTER — Other Ambulatory Visit: Payer: Self-pay | Admitting: Family Medicine

## 2019-11-07 DIAGNOSIS — I10 Essential (primary) hypertension: Secondary | ICD-10-CM

## 2019-11-17 ENCOUNTER — Telehealth: Payer: Self-pay | Admitting: Family Medicine

## 2019-11-17 NOTE — Telephone Encounter (Signed)
Pt call and want a call back to talk  about a result she got from a appt she had today.

## 2019-11-17 NOTE — Telephone Encounter (Signed)
I called and spoke with pt. She went to see a rheumatologist in [facility]. They tested her for Lupus due to the elevated sed rate & positive ANA. 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 They wanted her to start on medication today, but she wants a second opinion on it. She will bring the test results with her to her physical on Monday.

## 2019-11-19 ENCOUNTER — Telehealth: Payer: Self-pay | Admitting: Family Medicine

## 2019-11-19 NOTE — Telephone Encounter (Signed)
FYI :Pt called in stated that she was not able to get the images for her appointment on Monday with Dr. [name] due to the place in [place] is closed so she will have them fax it to the office on Monday.

## 2019-11-22 ENCOUNTER — Encounter: Payer: Self-pay | Admitting: Family Medicine

## 2019-11-22 ENCOUNTER — Other Ambulatory Visit: Payer: Self-pay

## 2019-11-22 ENCOUNTER — Ambulatory Visit (INDEPENDENT_AMBULATORY_CARE_PROVIDER_SITE_OTHER): Payer: Managed Care, Other (non HMO) | Admitting: Family Medicine

## 2019-11-22 VITALS — BP 136/84 | HR 76 | Temp 98.4°F | Resp 16 | Ht 69.0 in | Wt 186.4 lb

## 2019-11-22 DIAGNOSIS — R768 Other specified abnormal immunological findings in serum: Secondary | ICD-10-CM | POA: Diagnosis not present

## 2019-11-22 DIAGNOSIS — N1831 Chronic kidney disease, stage 3a: Secondary | ICD-10-CM | POA: Diagnosis not present

## 2019-11-22 DIAGNOSIS — Z780 Asymptomatic menopausal state: Secondary | ICD-10-CM | POA: Diagnosis not present

## 2019-11-22 DIAGNOSIS — Z1211 Encounter for screening for malignant neoplasm of colon: Secondary | ICD-10-CM

## 2019-11-22 DIAGNOSIS — Z13220 Encounter for screening for lipoid disorders: Secondary | ICD-10-CM

## 2019-11-22 DIAGNOSIS — Z1329 Encounter for screening for other suspected endocrine disorder: Secondary | ICD-10-CM

## 2019-11-22 DIAGNOSIS — Z13228 Encounter for screening for other metabolic disorders: Secondary | ICD-10-CM

## 2019-11-22 DIAGNOSIS — Z0000 Encounter for general adult medical examination without abnormal findings: Secondary | ICD-10-CM | POA: Diagnosis not present

## 2019-11-22 DIAGNOSIS — Z23 Encounter for immunization: Secondary | ICD-10-CM | POA: Diagnosis not present

## 2019-11-22 DIAGNOSIS — Z130 Encounter for screening for diseases of the blood and blood-forming organs and certain disorders involving the immune mechanism: Secondary | ICD-10-CM

## 2019-11-22 NOTE — Patient Instructions (Addendum)
Today you have you routine preventive visit. A few things to remember from today's visit:  Routine general medical examination at a health care facility  Asymptomatic postmenopausal estrogen deficiency - Plan: DEXAScan  Screening for lipoid disorders - Plan: Lipid panel  Screening for endocrine, metabolic and immunity disorder - Plan: Hemoglobin A1c  Positive ANA (antinuclear antibody) - Plan: Ambulatory referral to Rheumatology  If you need refills please call your pharmacy. Do not use My Chart to request refills or for acute issues that need immediate attention.   Please be sure medication list is accurate. If a new problem present, please set up appointment sooner than planned today.  At least 150 minutes of moderate exercise per week, daily brisk walking for 15-30 min is a good exercise option. Healthy diet low in saturated (animal) fats and sweets and consisting of fresh fruits and vegetables, lean meats such as fish and white chicken and whole grains.  These are some of recommendations for screening depending of age and risk factors:  - Vaccines:  Tdap vaccine every 10 years.  Shingles vaccine recommended at age 106, could be given after 60 years of age but not sure about insurance coverage.   Pneumonia vaccines: Pneumovax at 46. Sometimes Pneumovax is giving earlier if history of smoking, lung disease,diabetes,kidney disease among some.  Screening for diabetes at age 52 and every 3 years.  Cervical cancer prevention:  Pap smear starts at 60 years of age and continues periodically until 60 years old in low risk women. Pap smear every 3 years between 82 and 10 years old. Pap smear every 3-5 years between women 59 and older if pap smear negative and HPV screening negative.   -Breast cancer: Mammogram: There is disagreement between experts about when to start screening in low risk asymptomatic female but recent recommendations are to start screening at 66 and not later than  60 years old , every 1-2 years and after 60 yo q 2 years. Screening is recommended until 60 years old but some women can continue screening depending of healthy issues.  Colon cancer screening: Has been recently changed to 60 yo. Insurance may not cover until you are 60 years old. Screening is recommended until 60 years old.  Cholesterol disorder screening at age 26 and every 3 years.  Also recommended:  1. Dental visit- Brush and floss your teeth twice daily; visit your dentist twice a year. 2. Eye doctor- Get an eye exam at least every 2 years. 3. Helmet use- Always wear a helmet when riding a bicycle, motorcycle, rollerblading or skateboarding. 4. Safe sex- If you may be exposed to sexually transmitted infections, use a condom. 5. Seat belts- Seat belts can save your live; always wear one. 6. Smoke/Carbon Monoxide detectors- These detectors need to be installed on the appropriate level of your home. Replace batteries at least once a year. 7. Skin cancer- When out in the sun please cover up and use sunscreen 15 SPF or higher. 8. Violence- If anyone is threatening or hurting you, please tell your healthcare provider.  9. Drink alcohol in moderation- Limit alcohol intake to one drink or less per day. Never drink and drive. 10. Calcium supplementation 1000 to 1200 mg daily, ideally through your diet.  Vitamin D supplementation 800 units daily.

## 2019-11-22 NOTE — Progress Notes (Signed)
HPI: Karen [name] is a pleasant 59 y.o. female, who is here today for her routine physical.  Last CPE: 11/24/18.  Regular exercise 3 or more time per week: Not regularly since she is back to work. Following a healthy diet: Yes She lives with her husband.  Chronic medical problems: Bipolar disorder,HTN,CKD III,anemia of chronic disease,and hypothyroidism among some.  Pap smear: 11/24/17 negative for malignancy and HPV was not detected. Hx of abnormal pap smears: Negative  Immunization History  Administered Date(s) Administered  . Influenza Split 11/06/2010, 12/18/2011  . Influenza Whole 10/22/2007, 10/28/2008, 11/02/2009  . Influenza,inj,Quad PF,6+ Mos 11/25/2012, 12/15/2013, 10/11/2015, 11/22/2016, 11/24/2017, 11/24/2018, 11/22/2019  . PFIZER SARS-COV-2 Vaccination 04/03/2019, 04/24/2019  . PPD Test 08/22/2014  . Pneumococcal Polysaccharide-23 11/22/2016  . Td 02/04/1998, 10/28/2008  . Tdap 11/24/2018  . Zoster Recombinat (Shingrix) 11/24/2017, 01/23/2018, 11/22/2019   Mammogram: 08/05/2019 Colonoscopy: 11/27/2009. DEXA: N/A  Hep C screening: 11/22/16 NR.  Concerns today:  Evaluated by immunologist because recurrent facial edema and urticaria. Immunologist ruled out allergies. ANA positive, so she was referred to rheumatologist.  According to pt, she was dx'ed with lupus, not sure about meaning of Dx. Treatment was recommended but she prefers to hold on medications for now.  She has not noted fever,chills,malar rash,hair loss,oral/nasal mucosa ulcers. Negative for arthralgias,joint edema,or erythema. 09/27/19 she was evaluated in acute care for LLE edema.  Lab Results  Component Value Date   ESRSEDRATE 55 (H) 09/02/2019   10/28/19 ESR and CRP in normal range, 21 and 3 respectively.  Lab Results  Component Value Date   CRP 1.8 08/16/2019   Denies foam in urine or gross hematuria.  Chronic anemia. She follows with hematologist. On Epoetin  infusions.  Lab Results  Component Value Date   WBC 2.9 (L) 10/29/2019   HGB 9.1 (L) 10/29/2019   HCT 29.1 (L) 10/29/2019   MCV 91.8 10/29/2019   PLT 204 10/29/2019   She is requesting referral to rheumatologist here in town, it is hard for her to commute.  HTN and CKD III: She is on Amlodipine 5 mg daily.  Related to Comprehensive Metabolic Panel Component 77/41/42 09/25/19  Glucose 89 78  BUN 12 13  Creatinine 1.14High 1.04High  eGFR If NonAfrican American 52Low --  eGFR If African American 60  --  BUN/Creatinine Ratio 11Low --  Sodium 141 --  Potassium 3.6 4.2  Chloride 105 --  CO2 23 26  CALCIUM 8.7 --  Total Protein 6.6 6.2  Albumin, Serum 3.9 --  Globulin, Total 2.7 --  Albumin/Globulin Ratio 1.4 --  Total Bilirubin 0.5 --  Alkaline Phosphatase 108  --  AST 21 25  ALT (SGPT) 26 --  AGAP -- 9  ALBUMIN/GLOBULIN RATIO -- 1.4  ALK PHOS -- 61  ALT -- 19  Alb -- 3.6  BUN/CREAT RATIO -- 12.5  Ca -- 9.1  Cl -- 103  GFR AFRICAN AMERICAN -- 67   GFR Non African American -- 59   GLOBULIN -- 2.6  Na -- 138  T Bili -- 0.31    Review of Systems  Constitutional: Negative for appetite change, fatigue and fever.  HENT: Negative for hearing loss, mouth sores and sore throat.   Eyes: Negative for redness and visual disturbance.  Respiratory: Negative for cough, shortness of breath and wheezing.   Cardiovascular: Positive for leg swelling. Negative for chest pain.  Gastrointestinal: Negative for abdominal pain, nausea and vomiting.       No changes in  bowel habits.  Endocrine: Negative for cold intolerance, heat intolerance, polydipsia, polyphagia and polyuria.  Genitourinary: Negative for decreased urine volume, dysuria, vaginal bleeding and vaginal discharge.  Musculoskeletal: Negative for gait problem and myalgias.  Skin: Negative for color change and rash.  Allergic/Immunologic: Positive for environmental allergies.  Neurological: Negative for syncope,  weakness and headaches.  Hematological: Negative for adenopathy. Does not bruise/bleed easily.  Psychiatric/Behavioral: Negative for confusion and hallucinations. The patient is nervous/anxious.   All other systems reviewed and are negative.  Current Outpatient Medications on File Prior to Visit  Medication Sig Dispense Refill  . amLODipine (NORVASC) 5 MG tablet TAKE 1/2 TABLET BY MOUTH DAILY 15 tablet 5  . buPROPion (WELLBUTRIN XL) 150 MG 24 hr tablet Take 150 mg by mouth daily.    . diclofenac (VOLTAREN) 75 MG EC tablet Take 75 mg by mouth 2 (two) times daily.    Marland Kitchen escitalopram (LEXAPRO) 20 MG tablet Take 20 mg by mouth daily.      Marland Kitchen FOLIC ACID PO Take by mouth daily.    Marland Kitchen levothyroxine (SYNTHROID) 125 MCG tablet TAKE 1 TABLET BY MOUTH EVERY DAY 30 tablet 5  . omeprazole (PRILOSEC) 20 MG capsule TAKE 1 CAPSULE BY MOUTH EVERY DAY 30 capsule 5  . risperiDONE (RISPERDAL) 1 MG tablet Take 1 mg by mouth at bedtime.  1  . Vitamin D, Ergocalciferol, (DRISDOL) 1.25 MG (50000 UNIT) CAPS capsule TAKE 1 CAPSULE BY MOUTH EVERY 14 (FOURTEEN) DAYS. FOR 8 WEEKS. THEN EVERY 2 WEEKS. 2 capsule 11  . POTASSIUM PO Take by mouth daily.     No current facility-administered medications on file prior to visit.   Past Medical History:  Diagnosis Date  . Anemia   . Barrett esophagus   . Depression   . DUB (dysfunctional uterine bleeding)   . GERD (gastroesophageal reflux disease)   . Graves disease   . Hypothyroidism     Past Surgical History:  Procedure Laterality Date  . CHOLECYSTECTOMY N/A 07/16/2012   Procedure: LAPAROSCOPIC CHOLECYSTECTOMY WITH INTRAOPERATIVE CHOLANGIOGRAM;  Surgeon: Joyice Faster. Cornett, MD;  Location: [facility];  Service: General;  Laterality: N/A;  . COLONOSCOPY    . FOOT SURGERY     Left  . UPPER GASTROINTESTINAL ENDOSCOPY      Allergies  Allergen Reactions  . Milk-Related Compounds Other (See Comments)    Intolerance to milk products-causes gas    Family History  Problem  Relation Age of Onset  . Hypertension Mother   . Asthma Mother   . Cancer Father 70       larynx cancer   . Breast cancer Maternal Aunt 60  . Colon cancer Neg Hx   . Colon polyps Neg Hx   . Esophageal cancer Neg Hx   . Rectal cancer Neg Hx   . Stomach cancer Neg Hx     Social History   Socioeconomic History  . Marital status: Married    Spouse name: Not on file  . Number of children: 0  . Years of education: Not on file  . Highest education level: Not on file  Occupational History    Employer: [employer]  Tobacco Use  . Smoking status: Former Smoker    Packs/day: 1.00    Years: 20.00    Pack years: 20.00    Types: Cigarettes    Start date: 03/13/1967    Quit date: 07/14/1987    Years since quitting: 32.3  . Smokeless tobacco: Never Used  . Tobacco comment: quit  26 years ago  Vaping Use  . Vaping Use: Never used  Substance and Sexual Activity  . Alcohol use: No    Alcohol/week: 0.0 standard drinks  . Drug use: No  . Sexual activity: Not on file  Other Topics Concern  . Not on file  Social History Narrative   Occupation: Freight forwarder   Regular exercise- yes   0 caffeine drinks    Social Determinants of Radio broadcast assistant Strain:   . Difficulty of Paying Living Expenses: Not on file  Food Insecurity:   . Worried About Charity fundraiser in the Last Year: Not on file  . Ran Out of Food in the Last Year: Not on file  Transportation Needs:   . Lack of Transportation (Medical): Not on file  . Lack of Transportation (Non-Medical): Not on file  Physical Activity:   . Days of Exercise per Week: Not on file  . Minutes of Exercise per Session: Not on file  Stress:   . Feeling of Stress : Not on file  Social Connections:   . Frequency of Communication with Friends and Family: Not on file  . Frequency of Social Gatherings with Friends and Family: Not on file  . Attends Religious Services: Not on file  . Active Member of Clubs or Organizations: Not on file   . Attends Archivist Meetings: Not on file  . Marital Status: Not on file     Vitals:   11/22/19 1435  BP: 136/84  Pulse: 76  Resp: 16  Temp: 98.4 F (36.9 C)  SpO2: 96%   Body mass index is 27.53 kg/m.   Wt Readings from Last 3 Encounters:  11/22/19 186 lb 6.4 oz (84.6 kg)  09/16/19 173 lb (78.5 kg)  09/02/19 178 lb 6.4 oz (80.9 kg)     Physical Exam Vitals and nursing note reviewed.  Constitutional:      General: She is not in acute distress.    Appearance: She is well-developed.  HENT:     Head: Normocephalic and atraumatic.     Right Ear: Hearing, tympanic membrane, ear canal and external ear normal.     Left Ear: Hearing, tympanic membrane, ear canal and external ear normal.     Mouth/Throat:     Mouth: Mucous membranes are moist.     Pharynx: Oropharynx is clear. Uvula midline.  Eyes:     Extraocular Movements: Extraocular movements intact.     Conjunctiva/sclera: Conjunctivae normal.     Pupils: Pupils are equal, round, and reactive to light.  Neck:     Thyroid: No thyromegaly.     Trachea: No tracheal deviation.  Cardiovascular:     Rate and Rhythm: Normal rate and regular rhythm.     Pulses:          Dorsalis pedis pulses are 2+ on the right side and 2+ on the left side.     Heart sounds: No murmur heard.   Pulmonary:     Effort: Pulmonary effort is normal. No respiratory distress.     Breath sounds: Normal breath sounds.  Abdominal:     Palpations: Abdomen is soft. There is no hepatomegaly or mass.     Tenderness: There is no abdominal tenderness.  Genitourinary:    Comments: Breast: No masses, skin changes,or nipple discharge bilateral. Musculoskeletal:     Right lower leg: No edema.     Left lower leg: No edema.     Comments: No signs of  synovitis appreciated.  Lymphadenopathy:     Cervical: No cervical adenopathy.     Upper Body:     Right upper body: No supraclavicular or axillary adenopathy.     Left upper body: No  supraclavicular or axillary adenopathy.  Skin:    General: Skin is warm.     Findings: No erythema or rash.  Neurological:     Mental Status: She is alert and oriented to person, place, and time.     Cranial Nerves: No cranial nerve deficit.     Coordination: Coordination normal.     Gait: Gait normal.     Deep Tendon Reflexes:     Reflex Scores:      Bicep reflexes are 2+ on the right side and 2+ on the left side.      Patellar reflexes are 2+ on the right side and 2+ on the left side. Psychiatric:        Mood and Affect: Mood is anxious.   ASSESSMENT AND PLAN:  Karen [name] was here today annual physical examination.  Orders Placed This Encounter  Procedures  . DEXAScan  . Flu Vaccine QUAD 36+ mos IM  . Varicella-zoster vaccine IM  . Lipid panel  . Hemoglobin A1c  . Ambulatory referral to Rheumatology  . Ambulatory referral to Gastroenterology   Lab Results  Component Value Date   HGBA1C 4.6 11/22/2019   Lab Results  Component Value Date   CHOL 152 11/22/2019   HDL 47 (L) 11/22/2019   Mulat 88 11/22/2019   LDLDIRECT 148.9 10/25/2010   TRIG 80 11/22/2019   CHOLHDL 3.2 11/22/2019   Routine general medical examination at a health care facility We discussed the importance of regular physical activity and healthy diet for prevention of chronic illness and/or complications. Preventive guidelines reviewed. Vaccination updated. Aspirin for primary prevention discussed. Ca++ and vit D supplementation recommended. Next CPE in a year.  The 10-year ASCVD risk score Mikey Bussing DC Brooke Bonito., et al., 2013) is: 6.9%   Values used to calculate the score:     Age: 14 years     Sex: Female     Is Non-Hispanic African American: Yes     Diabetic: No     Tobacco smoker: No     Systolic Blood Pressure: 631 mmHg     Is BP treated: Yes     HDL Cholesterol: 47 mg/dL     Total Cholesterol: 152 mg/dL  Asymptomatic postmenopausal estrogen deficiency -     DEXAScan;  Future  Screening for lipoid disorders -     Lipid panel  Screening for endocrine, metabolic and immunity disorder -     Hemoglobin A1c  Positive ANA (antinuclear antibody) She is very concerned about the probability of having lupus. We discussed possible causes, Dx criteria for lupus and physiopathology. I have not received copy of recent labs done at rheumatologist's office. We went through all lab results I have available at this time, those ordered by immunologist.  She would like a  rheumatolgist in town, so referral placed.  Need for influenza vaccination -     Flu Vaccine QUAD 36+ mos IM  Need for shingles vaccine -     Varicella-zoster vaccine IM  Colon cancer screening -     Ambulatory referral to Gastroenterology  Stage 3a chronic kidney disease ([place]) Problem has been stable. We discussed Dx,prognosis,and available treatments. Adequate BP controlled and hydration, low salt diet to continue. Avoidance of NSAID's, Diclofenac recently prescribed by ortho.  Return in 6 months (on 05/22/2020) for 5-6 months HTN,CKD III.  Spenser Harren G. [name], MD  [facility]. [place] office.   Today you have you routine preventive visit. A few things to remember from today's visit:  Routine general medical examination at a health care facility  Asymptomatic postmenopausal estrogen deficiency - Plan: DEXAScan  Screening for lipoid disorders - Plan: Lipid panel  Screening for endocrine, metabolic and immunity disorder - Plan: Hemoglobin A1c  Positive ANA (antinuclear antibody) - Plan: Ambulatory referral to Rheumatology  If you need refills please call your pharmacy. Do not use My Chart to request refills or for acute issues that need immediate attention.   Please be sure medication list is accurate. If a new problem present, please set up appointment sooner than planned today.  At least 150 minutes of moderate exercise per week, daily brisk walking for 15-30 min is  a good exercise option. Healthy diet low in saturated (animal) fats and sweets and consisting of fresh fruits and vegetables, lean meats such as fish and white chicken and whole grains.  These are some of recommendations for screening depending of age and risk factors:  - Vaccines:  Tdap vaccine every 10 years.  Shingles vaccine recommended at age 13, could be given after 60 years of age but not sure about insurance coverage.   Pneumonia vaccines: Pneumovax at 8. Sometimes Pneumovax is giving earlier if history of smoking, lung disease,diabetes,kidney disease among some.  Screening for diabetes at age 48 and every 3 years.  Cervical cancer prevention:  Pap smear starts at 60 years of age and continues periodically until 60 years old in low risk women. Pap smear every 3 years between 10 and 31 years old. Pap smear every 3-5 years between women 36 and older if pap smear negative and HPV screening negative.   -Breast cancer: Mammogram: There is disagreement between experts about when to start screening in low risk asymptomatic female but recent recommendations are to start screening at 68 and not later than 60 years old , every 1-2 years and after 60 yo q 2 years. Screening is recommended until 60 years old but some women can continue screening depending of healthy issues.  Colon cancer screening: Has been recently changed to 60 yo. Insurance may not cover until you are 60 years old. Screening is recommended until 60 years old.  Cholesterol disorder screening at age 56 and every 3 years.  Also recommended:  1. Dental visit- Brush and floss your teeth twice daily; visit your dentist twice a year. 2. Eye doctor- Get an eye exam at least every 2 years. 3. Helmet use- Always wear a helmet when riding a bicycle, motorcycle, rollerblading or skateboarding. 4. Safe sex- If you may be exposed to sexually transmitted infections, use a condom. 5. Seat belts- Seat belts can save your live; always  wear one. 6. Smoke/Carbon Monoxide detectors- These detectors need to be installed on the appropriate level of your home. Replace batteries at least once a year. 7. Skin cancer- When out in the sun please cover up and use sunscreen 15 SPF or higher. 8. Violence- If anyone is threatening or hurting you, please tell your healthcare provider.  9. Drink alcohol in moderation- Limit alcohol intake to one drink or less per day. Never drink and drive. 10. Calcium supplementation 1000 to 1200 mg daily, ideally through your diet.  Vitamin D supplementation 800 units daily.

## 2019-11-23 LAB — HEMOGLOBIN A1C
Hgb A1c MFr Bld: 4.6 % of total Hgb (ref <5.7)
Mean Plasma Glucose: 85 (calc)
eAG (mmol/L): 4.7 (calc)

## 2019-11-23 LAB — LIPID PANEL
Cholesterol: 152 mg/dL (ref <200)
HDL: 47 mg/dL — ABNORMAL LOW (ref >=50)
LDL Cholesterol (Calc): 88 mg/dL (calc)
Non-HDL Cholesterol (Calc): 105 mg/dL (calc) (ref <130)
Total CHOL/HDL Ratio: 3.2 (calc) (ref <5.0)
Triglycerides: 80 mg/dL (ref <150)

## 2019-12-10 ENCOUNTER — Encounter: Payer: Managed Care, Other (non HMO) | Admitting: Family Medicine

## 2019-12-24 ENCOUNTER — Encounter: Payer: Self-pay | Admitting: *Deleted

## 2019-12-24 ENCOUNTER — Other Ambulatory Visit: Payer: Self-pay | Admitting: *Deleted

## 2019-12-24 MED ORDER — RETACRIT 40000 UNIT/ML IJ SOLN: 40000.0000 [IU] | INTRAMUSCULAR | 3 refills | Status: DC

## 2019-12-24 NOTE — Progress Notes (Signed)
Received message from pharmacy requesting to cancel pt upcoming injection apts for retacrit stating pt will be self administering medication at home.  RN attempt x 1 to contact pt and inform her that the injection apt will be canceled but she will still need to come in for lab work prior to self administration. No answer, LVM to return call to the office.  RN placed call to [facility] who has stated they do not cover pt insurance and transferred prescription to [facility] who has stated they never received the prescription.  RN gave verbal order to pharmacist with [facility] for Retacrit 40,000 units every two months starting 12/31/19 as well as sent a prescription electronically.

## 2019-12-27 ENCOUNTER — Other Ambulatory Visit: Payer: Self-pay | Admitting: *Deleted

## 2019-12-27 ENCOUNTER — Telehealth: Payer: Self-pay | Admitting: *Deleted

## 2019-12-27 MED ORDER — RETACRIT 40000 UNIT/ML IJ SOLN: 40000.0000 [IU] | INTRAMUSCULAR | 3 refills | Status: DC

## 2019-12-27 NOTE — Telephone Encounter (Signed)
RN attempt to contact pt regarding home self administration of Retacrit.  No answer, LVM to return call to the office.

## 2019-12-29 ENCOUNTER — Other Ambulatory Visit: Payer: Self-pay

## 2019-12-29 ENCOUNTER — Ambulatory Visit (INDEPENDENT_AMBULATORY_CARE_PROVIDER_SITE_OTHER): Payer: Managed Care, Other (non HMO) | Admitting: Family Medicine

## 2019-12-29 ENCOUNTER — Encounter: Payer: Self-pay | Admitting: Family Medicine

## 2019-12-29 ENCOUNTER — Telehealth: Payer: Self-pay

## 2019-12-29 VITALS — BP 116/72 | HR 75 | Temp 98.1°F | Resp 16 | Ht 69.0 in | Wt 189.0 lb

## 2019-12-29 DIAGNOSIS — M79674 Pain in right toe(s): Secondary | ICD-10-CM

## 2019-12-29 DIAGNOSIS — Z5181 Encounter for therapeutic drug level monitoring: Secondary | ICD-10-CM

## 2019-12-29 DIAGNOSIS — N1831 Chronic kidney disease, stage 3a: Secondary | ICD-10-CM | POA: Diagnosis not present

## 2019-12-29 DIAGNOSIS — D500 Iron deficiency anemia secondary to blood loss (chronic): Secondary | ICD-10-CM

## 2019-12-29 DIAGNOSIS — I10 Essential (primary) hypertension: Secondary | ICD-10-CM

## 2019-12-29 NOTE — Progress Notes (Signed)
Chief Complaint  Patient presents with  . medication concern   HPI: Karen [name] is a 59 y.o. female, who is here today to discuss some side effects of medication she started taking recently. Dx'ed with lupus. 3-4 weeks she was started on Hydroxychloroquine 200 mg by her rheumatologist. She has tolerated medication well sp far. Started Hydroxychloroquine 200 mg daily and about a week ago increased it to bid.  She is concerned about possible interaction with Lexapro. According to pt, her pharmacist will not fill Rx for Hydroxychloroquine until her rheumatologist calls pharmacy. She has tried to contact her rheumatologist.  HTN and CKD III: She is on Amlodipine 5 mg daily. She has not noted  chest pain, dyspnea, palpitation,orthopnea,PND, or edema. Negative for gross hematuria,foam in urine,or decreased urine output.  Glucose 65 - 99 mg/dL 89        BUN 8 - 27 mg/dL 12        Creatinine 0.57 - 1.00 mg/dL 1.14High        eGFR If NonAfrican American >59 mL/min/1.73 52Low        eGFR If African American >59 mL/min/1.73 60  **[facility] currently reports eGFR in compliance with the current**  recommendations of the [employer]. [facility] will  update reporting as new guidelines are published from the NKF-ASN  Task force.      BUN/Creatinine Ratio 12 - 28  11Low        Sodium 134 - 144 mmol/L 141        Potassium 3.5 - 5.2 mmol/L 3.6        Chloride 96 - 106 mmol/L 105        CO2 20 - 29 mmol/L 23        CALCIUM 8.7 - 10.3 mg/dL 8.7        Total Protein 6.0 - 8.5 g/dL 6.6        Albumin, Serum 3.8 - 4.9 g/dL 3.9        Globulin, Total 1.5 - 4.5 g/dL 2.7        Albumin/Globulin Ratio 1.2 - 2.2  1.4        Total Bilirubin 0.0 - 1.2 mg/dL 0.5        Alkaline Phosphatase 44 - 121 IU/L 108        **Please note reference interval change**      AST 0 - 40 IU/L 21        ALT (SGPT) 0 - 32 IU/L 26         She has had some IP right  foot pain. It has been treated by ortho (Dr Alfredo Martinez) with Diclofenac. She thinks is may be plantar fascitis recurrence and has called her pharmacy for a refills. Pain is mild,it has improved. No hx of trauma.  Iron def anemia:Follows with Dr Lindi Adie. Hematologist's office has tried to contact her in regard to home self administration of Retacrit. She does not think she would be able to give herself injections.   Ref Range & Units 1 mo ago  WBC 3.4 - 10.8 x10E3/uL 2.9Low      RBC 3.77 - 5.28 x10E6/uL 3.19Low      Hemoglobin 11.1 - 15.9 g/dL 9.6Low      Hematocrit 34.0 - 46.6 % 29.5Low      MCV 79 - 97 fL 93      MCH 26.6 - 33.0 pg 30.1      MCHC 31.5 - 35.7 g/dL 32.5  RDW 11.7 - 15.4 % 13.6      Platelet Count 150 - 450 x10E3/uL 231      Neutrophils Not Estab. % 55      Lymphs Relative  Not Estab. % 32      Monocytes Not Estab. % 12      Eos Relative  Not Estab. % 1      Basos Relative  Not Estab. % 0      Neutrophils Absolute 1.4 - 7.0 x10E3/uL 1.6      Lymphocytes Absolute 0.7 - 3.1 x10E3/uL 0.9      Monocytes Absolute 0.1 - 0.9 x10E3/uL 0.3      Eosinophils Absolute 0.0 - 0.4 x10E3/uL 0.0      Basophils Absolute 0.0 - 0.2 x10E3/uL 0.0      Immature Granulocytes Not Estab. % 0      Immature Grans (Abs) 0.0 - 0.1 x10E3/uL 0.0     Review of Systems  Constitutional: Negative for activity change, appetite change, fatigue and fever.  HENT: Negative for mouth sores, nosebleeds and sore throat.   Eyes: Negative for redness and visual disturbance.  Respiratory: Negative for cough and wheezing.   Gastrointestinal: Negative for abdominal pain, nausea and vomiting.       Negative for changes in bowel habits.  Neurological: Negative for syncope, weakness and headaches.  Rest see pertinent positives and negatives per HPI.  Current Outpatient Medications on File Prior to Visit  Medication Sig Dispense Refill  . amLODipine (NORVASC) 5 MG tablet TAKE 1/2  TABLET BY MOUTH DAILY 15 tablet 5  . buPROPion (WELLBUTRIN XL) 150 MG 24 hr tablet Take 150 mg by mouth daily.    Marland Kitchen epoetin alfa-epbx (RETACRIT) 35573 UNIT/ML injection Inject 1 mL (40,000 Units total) into the skin every 30 (thirty) days. Self administer subcutaneously every 60 days after receiving lab work. 1.8 mL 3  . escitalopram (LEXAPRO) 20 MG tablet Take 20 mg by mouth daily.      Marland Kitchen FOLIC ACID PO Take by mouth daily.    Marland Kitchen levothyroxine (SYNTHROID) 125 MCG tablet TAKE 1 TABLET BY MOUTH EVERY DAY 30 tablet 5  . omeprazole (PRILOSEC) 20 MG capsule TAKE 1 CAPSULE BY MOUTH EVERY DAY 30 capsule 5  . risperiDONE (RISPERDAL) 1 MG tablet Take 1 mg by mouth at bedtime.  1  . Vitamin D, Ergocalciferol, (DRISDOL) 1.25 MG (50000 UNIT) CAPS capsule TAKE 1 CAPSULE BY MOUTH EVERY 14 (FOURTEEN) DAYS. FOR 8 WEEKS. THEN EVERY 2 WEEKS. 2 capsule 11   No current facility-administered medications on file prior to visit.   Past Medical History:  Diagnosis Date  . Anemia   . Barrett esophagus   . Depression   . DUB (dysfunctional uterine bleeding)   . GERD (gastroesophageal reflux disease)   . Graves disease   . Hypothyroidism    Allergies  Allergen Reactions  . Milk-Related Compounds Other (See Comments)    Intolerance to milk products-causes gas    Social History   Socioeconomic History  . Marital status: Married    Spouse name: Not on file  . Number of children: 0  . Years of education: Not on file  . Highest education level: Not on file  Occupational History    Employer: [employer]  Tobacco Use  . Smoking status: Former Smoker    Packs/day: 1.00    Years: 20.00    Pack years: 20.00    Types: Cigarettes    Start date: 03/13/1967  Quit date: 07/14/1987    Years since quitting: 32.4  . Smokeless tobacco: Never Used  . Tobacco comment: quit 26 years ago  Vaping Use  . Vaping Use: Never used  Substance and Sexual Activity  . Alcohol use: No    Alcohol/week: 0.0 standard drinks  . Drug  use: No  . Sexual activity: Not on file  Other Topics Concern  . Not on file  Social History Narrative   Occupation: Freight forwarder   Regular exercise- yes   0 caffeine drinks    Social Determinants of Radio broadcast assistant Strain:   . Difficulty of Paying Living Expenses: Not on file  Food Insecurity:   . Worried About Charity fundraiser in the Last Year: Not on file  . Ran Out of Food in the Last Year: Not on file  Transportation Needs:   . Lack of Transportation (Medical): Not on file  . Lack of Transportation (Non-Medical): Not on file  Physical Activity:   . Days of Exercise per Week: Not on file  . Minutes of Exercise per Session: Not on file  Stress:   . Feeling of Stress : Not on file  Social Connections:   . Frequency of Communication with Friends and Family: Not on file  . Frequency of Social Gatherings with Friends and Family: Not on file  . Attends Religious Services: Not on file  . Active Member of Clubs or Organizations: Not on file  . Attends Archivist Meetings: Not on file  . Marital Status: Not on file    Vitals:   12/29/19 1011  BP: 116/72  Pulse: 75  Resp: 16  Temp: 98.1 F (36.7 C)  SpO2: 98%   Body mass index is 27.91 kg/m.  Physical Exam Vitals and nursing note reviewed.  Constitutional:      General: She is not in acute distress.    Appearance: She is well-developed.  HENT:     Head: Normocephalic and atraumatic.  Eyes:     Conjunctiva/sclera: Conjunctivae normal.     Pupils: Pupils are equal, round, and reactive to light.  Cardiovascular:     Rate and Rhythm: Normal rate and regular rhythm.     Pulses:          Dorsalis pedis pulses are 2+ on the right side and 2+ on the left side.     Heart sounds: No murmur heard.   Pulmonary:     Effort: Pulmonary effort is normal. No respiratory distress.     Breath sounds: Normal breath sounds.  Abdominal:     Palpations: Abdomen is soft. There is no mass.      Tenderness: There is no abdominal tenderness.  Musculoskeletal:     Comments: Right foot: No edema,erythema,or pain upon palpation of IP toes joints or plantar fascia.  Lymphadenopathy:     Cervical: No cervical adenopathy.  Skin:    General: Skin is warm.     Findings: No erythema or rash.  Neurological:     Mental Status: She is alert and oriented to person, place, and time.     Cranial Nerves: No cranial nerve deficit.     Gait: Gait normal.  Psychiatric:     Comments: Well groomed, good eye contact.    ASSESSMENT AND PLAN:  Ms.Rhys was seen today for medication concern.  Diagnoses and all orders for this visit: Orders Placed This Encounter  Procedures  . EKG 12-Lead    Stage 3a chronic kidney  disease ([place]) Problem has been stable. Low salt diet. Strongly recommend avoiding NSAID;s. Adequate hydration and BP control.  Hypertension, essential, benign BP adequately controlled. Continue Amlodipine 5 mg daily/ Low salt diet. Side effects of NSAID's discussed.  Medication monitoring encounter We reviewed side effects of Hydroxychloroquine, including risk of arrhythmias and interaction with SSRI's like Lexapro. The risk of cardiac arrhythmia is rare at current dose. Based on risk benefits, at this time, it seems like benefit outweigh the risk. EKG today:SR, LAD,and mild bradycardia.Otherwise normal. EKG done on 10/11/15, no significant changes. Recommend further discussion with her rheumatologist, for now continue taking medication.  Pain of toe of right foot Stop Diclofenac. Tylenol 500 mg 3-4 times daily prn.   Return if symptoms worsen or fail to improve, for Keep next appt..   Tyri Elmore G. [name], MD  [facility]. [employer] office.   A few things to remember from today's visit:  Hypertension, essential, benign - Plan: EKG 12-Lead  Medication monitoring encounter  If you need refills please call your pharmacy. Do not use My Chart to request refills  or for acute issues that need immediate attention.    Please be sure medication list is accurate. If a new problem present, please set up appointment sooner than planned today. You doctor os going to monitor your kidney,liver,and blood to be sure numbers are stable. Cardiac side effects are rear and you do not have history of heart disease.  Stop Diclofenac.  Hydroxychloroquine tablets What is this medicine? HYDROXYCHLOROQUINE (hye drox ee KLOR oh kwin) is used to treat rheumatoid arthritis and systemic lupus erythematosus. It is also used to treat malaria. This medicine may be used for other purposes; ask your health care provider or pharmacist if you have questions. COMMON BRAND NAME(S): Plaquenil, Quineprox What should I tell my health care provider before I take this medicine? They need to know if you have any of these conditions:  diabetes  eye disease, vision problems  G6PD deficiency  heart disease  history of irregular heartbeat  if you often drink alcohol  kidney disease  liver disease  porphyria  psoriasis  an unusual or allergic reaction to chloroquine, hydroxychloroquine, other medicines, foods, dyes, or preservatives  pregnant or trying to get pregnant  breast-feeding How should I use this medicine? Take this medicine by mouth with a glass of water. Follow the directions on the prescription label. Do not cut, crush or chew this medicine. Swallow the tablets whole. Take this medicine with food. Avoid taking antacids within 4 hours of taking this medicine. It is best to separate these medicines by at least 4 hours. Take your medicine at regular intervals. Do not take it more often than directed. Take all of your medicine as directed even if you think you are better. Do not skip doses or stop your medicine early. Talk to your pediatrician regarding the use of this medicine in children. While this drug may be prescribed for selected conditions, precautions do  apply. Overdosage: If you think you have taken too much of this medicine contact a poison control center or emergency room at once. NOTE: This medicine is only for you. Do not share this medicine with others. What if I miss a dose? If you miss a dose, take it as soon as you can. If it is almost time for your next dose, take only that dose. Do not take double or extra doses. What may interact with this medicine? Do not take this medicine with any of  the following medications:  cisapride  dronedarone  pimozide  thioridazine This medicine may also interact with the following medications:  ampicillin  antacids  cimetidine  cyclosporine  digoxin  kaolin  medicines for diabetes, like insulin, glipizide, glyburide  medicines for seizures like carbamazepine, phenobarbital, phenytoin  mefloquine  methotrexate  other medicines that prolong the QT interval (cause an abnormal heart rhythm)  praziquantel This list may not describe all possible interactions. Give your health care provider a list of all the medicines, herbs, non-prescription drugs, or dietary supplements you use. Also tell them if you smoke, drink alcohol, or use illegal drugs. Some items may interact with your medicine. What should I watch for while using this medicine? Visit your health care professional for regular checks on your progress. Tell your health care professional if your symptoms do not start to get better or if they get worse. You may need blood work done while you are taking this medicine. If you take other medicines that can affect heart rhythm, you may need more testing. Talk to your health care professional if you have questions. Your vision may be tested before and during use of this medicine. Tell your health care professional right away if you have any change in your eyesight. What side effects may I notice from receiving this medicine? Side effects that you should report to your doctor or health  care professional as soon as possible:  allergic reactions like skin rash, itching or hives, swelling of the face, lips, or tongue  changes in vision  decreased hearing or ringing of the ears  muscle weakness  redness, blistering, peeling or loosening of the skin, including inside the mouth  sensitivity to light  signs and symptoms of a dangerous change in heartbeat or heart rhythm like chest pain; dizziness; fast or irregular heartbeat; palpitations; feeling faint or lightheaded, falls; breathing problems  signs and symptoms of liver injury like dark yellow or brown urine; general ill feeling or flu-like symptoms; light-colored stools; loss of appetite; nausea; right upper belly pain; unusually weak or tired; yellowing of the eyes or skin  signs and symptoms of low blood sugar such as feeling anxious; confusion; dizziness; increased hunger; unusually weak or tired; sweating; shakiness; cold; irritable; headache; blurred vision; fast heartbeat; loss of consciousness  suicidal thoughts  uncontrollable head, mouth, neck, arm, or leg movements Side effects that usually do not require medical attention (report to your doctor or health care professional if they continue or are bothersome):  diarrhea  dizziness  hair loss  headache  irritable  loss of appetite  nausea, vomiting  stomach pain This list may not describe all possible side effects. Call your doctor for medical advice about side effects. You may report side effects to FDA at 1-800-FDA-1088. Where should I keep my medicine? Keep out of the reach of children. Store at room temperature between 15 and 30 degrees C (59 and 86 degrees F). Protect from moisture and light. Throw away any unused medicine after the expiration date. NOTE: This sheet is a summary. It may not cover all possible information. If you have questions about this medicine, talk to your doctor, pharmacist, or health care provider.  2020 Elsevier/Gold  Standard (2018-06-01 12:56:32)

## 2019-12-29 NOTE — Telephone Encounter (Signed)
RN received request for medication clarification from [facility].    RN spoke with Jarrett Soho, to provide clarification.  RN was on hold for 17 minutes.  No clarification needed at this time, however RN was notified that pharmacy is awaiting prior authorization.  Prior authorization was submitted on 11/23.  RN received faxed from [facility] with a denial of medication, Retacrit 40,000 units.  RN forward request for possible peer to peer to have medication approved to providers.    RN notified patient of above, verbalized understanding.  Pt aware to come in Friday for Labs only - pt is aware that she will not be having injection at clinic on 11/26.

## 2019-12-29 NOTE — Patient Instructions (Addendum)
A few things to remember from today's visit:  Hypertension, essential, benign - Plan: EKG 12-Lead  Medication monitoring encounter  If you need refills please call your pharmacy. Do not use My Chart to request refills or for acute issues that need immediate attention.    Please be sure medication list is accurate. If a new problem present, please set up appointment sooner than planned today. You doctor os going to monitor your kidney,liver,and blood to be sure numbers are stable. Cardiac side effects are rear and you do not have history of heart disease.  Stop Diclofenac.  Hydroxychloroquine tablets What is this medicine? HYDROXYCHLOROQUINE (hye drox ee KLOR oh kwin) is used to treat rheumatoid arthritis and systemic lupus erythematosus. It is also used to treat malaria. This medicine may be used for other purposes; ask your health care provider or pharmacist if you have questions. COMMON BRAND NAME(S): Plaquenil, Quineprox What should I tell my health care provider before I take this medicine? They need to know if you have any of these conditions:  diabetes  eye disease, vision problems  G6PD deficiency  heart disease  history of irregular heartbeat  if you often drink alcohol  kidney disease  liver disease  porphyria  psoriasis  an unusual or allergic reaction to chloroquine, hydroxychloroquine, other medicines, foods, dyes, or preservatives  pregnant or trying to get pregnant  breast-feeding How should I use this medicine? Take this medicine by mouth with a glass of water. Follow the directions on the prescription label. Do not cut, crush or chew this medicine. Swallow the tablets whole. Take this medicine with food. Avoid taking antacids within 4 hours of taking this medicine. It is best to separate these medicines by at least 4 hours. Take your medicine at regular intervals. Do not take it more often than directed. Take all of your medicine as directed even if  you think you are better. Do not skip doses or stop your medicine early. Talk to your pediatrician regarding the use of this medicine in children. While this drug may be prescribed for selected conditions, precautions do apply. Overdosage: If you think you have taken too much of this medicine contact a poison control center or emergency room at once. NOTE: This medicine is only for you. Do not share this medicine with others. What if I miss a dose? If you miss a dose, take it as soon as you can. If it is almost time for your next dose, take only that dose. Do not take double or extra doses. What may interact with this medicine? Do not take this medicine with any of the following medications:  cisapride  dronedarone  pimozide  thioridazine This medicine may also interact with the following medications:  ampicillin  antacids  cimetidine  cyclosporine  digoxin  kaolin  medicines for diabetes, like insulin, glipizide, glyburide  medicines for seizures like carbamazepine, phenobarbital, phenytoin  mefloquine  methotrexate  other medicines that prolong the QT interval (cause an abnormal heart rhythm)  praziquantel This list may not describe all possible interactions. Give your health care provider a list of all the medicines, herbs, non-prescription drugs, or dietary supplements you use. Also tell them if you smoke, drink alcohol, or use illegal drugs. Some items may interact with your medicine. What should I watch for while using this medicine? Visit your health care professional for regular checks on your progress. Tell your health care professional if your symptoms do not start to get better or if they get  worse. You may need blood work done while you are taking this medicine. If you take other medicines that can affect heart rhythm, you may need more testing. Talk to your health care professional if you have questions. Your vision may be tested before and during use of this  medicine. Tell your health care professional right away if you have any change in your eyesight. What side effects may I notice from receiving this medicine? Side effects that you should report to your doctor or health care professional as soon as possible:  allergic reactions like skin rash, itching or hives, swelling of the face, lips, or tongue  changes in vision  decreased hearing or ringing of the ears  muscle weakness  redness, blistering, peeling or loosening of the skin, including inside the mouth  sensitivity to light  signs and symptoms of a dangerous change in heartbeat or heart rhythm like chest pain; dizziness; fast or irregular heartbeat; palpitations; feeling faint or lightheaded, falls; breathing problems  signs and symptoms of liver injury like dark yellow or brown urine; general ill feeling or flu-like symptoms; light-colored stools; loss of appetite; nausea; right upper belly pain; unusually weak or tired; yellowing of the eyes or skin  signs and symptoms of low blood sugar such as feeling anxious; confusion; dizziness; increased hunger; unusually weak or tired; sweating; shakiness; cold; irritable; headache; blurred vision; fast heartbeat; loss of consciousness  suicidal thoughts  uncontrollable head, mouth, neck, arm, or leg movements Side effects that usually do not require medical attention (report to your doctor or health care professional if they continue or are bothersome):  diarrhea  dizziness  hair loss  headache  irritable  loss of appetite  nausea, vomiting  stomach pain This list may not describe all possible side effects. Call your doctor for medical advice about side effects. You may report side effects to FDA at 1-800-FDA-1088. Where should I keep my medicine? Keep out of the reach of children. Store at room temperature between 15 and 30 degrees C (59 and 86 degrees F). Protect from moisture and light. Throw away any unused medicine after  the expiration date. NOTE: This sheet is a summary. It may not cover all possible information. If you have questions about this medicine, talk to your doctor, pharmacist, or health care provider.  2020 Elsevier/Gold Standard (2018-06-01 12:56:32)

## 2019-12-31 ENCOUNTER — Other Ambulatory Visit: Payer: Self-pay

## 2019-12-31 ENCOUNTER — Ambulatory Visit: Payer: Managed Care, Other (non HMO)

## 2019-12-31 ENCOUNTER — Telehealth: Payer: Self-pay | Admitting: Adult Health

## 2019-12-31 ENCOUNTER — Inpatient Hospital Stay: Payer: Managed Care, Other (non HMO) | Attending: Hematology & Oncology

## 2019-12-31 DIAGNOSIS — D500 Iron deficiency anemia secondary to blood loss (chronic): Secondary | ICD-10-CM

## 2019-12-31 LAB — CBC WITH DIFFERENTIAL (CANCER CENTER ONLY)
Abs Immature Granulocytes: 0.01 10*3/uL (ref 0.00–0.07)
Basophils Absolute: 0 10*3/uL (ref 0.0–0.1)
Basophils Relative: 1 %
Eosinophils Absolute: 0.4 10*3/uL (ref 0.0–0.5)
Eosinophils Relative: 9 %
HCT: 29.5 % — ABNORMAL LOW (ref 36.0–46.0)
Hemoglobin: 9.1 g/dL — ABNORMAL LOW (ref 12.0–15.0)
Immature Granulocytes: 0 %
Lymphocytes Relative: 40 %
Lymphs Abs: 1.5 10*3/uL (ref 0.7–4.0)
MCH: 27.7 pg (ref 26.0–34.0)
MCHC: 30.8 g/dL (ref 30.0–36.0)
MCV: 89.9 fL (ref 80.0–100.0)
Monocytes Absolute: 0.4 10*3/uL (ref 0.1–1.0)
Monocytes Relative: 10 %
Neutro Abs: 1.5 10*3/uL — ABNORMAL LOW (ref 1.7–7.7)
Neutrophils Relative %: 40 %
Platelet Count: 217 10*3/uL (ref 150–400)
RBC: 3.28 MIL/uL — ABNORMAL LOW (ref 3.87–5.11)
RDW: 12.6 % (ref 11.5–15.5)
WBC Count: 3.7 10*3/uL — ABNORMAL LOW (ref 4.0–10.5)
nRBC: 0 % (ref 0.0–0.2)

## 2019-12-31 LAB — IRON AND TIBC
Iron: 89 ug/dL (ref 41–142)
Saturation Ratios: 38 % (ref 21–57)
TIBC: 234 ug/dL — ABNORMAL LOW (ref 236–444)
UIBC: 145 ug/dL (ref 120–384)

## 2019-12-31 LAB — FERRITIN: Ferritin: 357 ng/mL — ABNORMAL HIGH (ref 11–307)

## 2019-12-31 NOTE — Telephone Encounter (Signed)
Called peer to peer as requested.  Spoke with pharmacy.  They noted all they needed was a fax of the updated labs to open a new case at [phone].  Karen Bihari, NP

## 2019-12-31 NOTE — Progress Notes (Signed)
Pt came in today inquiring about Reacrit injection. Pt states she has been reeceiving calls from [facility] but hangs up on them because she thought it was a spam call. This LPN informed pt it is not spam and it is important she answers their calls. Pt understands retacrit is in process for peer review per previous note on 11/24.

## 2020-01-03 ENCOUNTER — Telehealth: Payer: Self-pay

## 2020-01-03 NOTE — Telephone Encounter (Signed)
RN called [facility] to follow up on approval of Retacrit - Recent labs were faxed on 11/26.   RN was on hold for 25 minutes while pharmacy staff reviewed recent information.  Per staff at [facility] new labs were not received.  RN reviewed lab values - Per pharmacist new PA will need to be submitted with recent labs.   RN will forward to our designated staff that completes PA's for this medication.

## 2020-01-05 ENCOUNTER — Telehealth: Payer: Self-pay

## 2020-01-05 NOTE — Telephone Encounter (Signed)
RN placed call to [facility] to check status of PA that was resubmitted on 11/30.   RN spoke with Herbie Baltimore at the [employer] [facility].  Confirmation received that PA was received, and verbalized decision should be faxed by 01/19/2020.  RN requested for urgent PA, Herbie Baltimore was able to update status to urgent.  Decision should be faxed to office within 72 hours.   RN placed call to Vonzell Schlatter at [phone], [facility] patient advocate, voicemail left for her updating on status.   RN spent approximately 30 minutes obtaining above information.

## 2020-01-06 ENCOUNTER — Encounter: Payer: Self-pay | Admitting: *Deleted

## 2020-01-06 NOTE — Progress Notes (Signed)
Received fax from [employer] stating Retacrit injection was denied because "the speed of pts red blood cell drop shows that she needs a blood transfusion".  Pt Hgb has been maintaining at 8.9-9.1 over the last year in a half with the help of the Retacrit injection.  Wilber Bihari, NP placed call for urgent appeal and documentation successfully faxed showing pts labs coinciding with the Retacrit injection since August of 2020.

## 2020-01-07 ENCOUNTER — Telehealth: Payer: Self-pay

## 2020-01-07 NOTE — Telephone Encounter (Signed)
This LPN spoke with Wilber Bihari, NP regarding PA for Retacrit. Mendel Ryder states P2P was denied but they requested further information regarding approval such as a letter from [place] stating why. Pt has been informed and assured her we would keep her up to date with status.

## 2020-01-10 ENCOUNTER — Telehealth: Payer: Self-pay | Admitting: Adult Health

## 2020-01-10 NOTE — Telephone Encounter (Signed)
Received message that was sent Friday, 12/2 at 445 requesting that I return a call to [phone] about Karen [name] and her ESA.  I called today, at 131 pm.  I left a message asking for a return call.    Wilber Bihari, NP

## 2020-01-12 ENCOUNTER — Encounter: Payer: Self-pay | Admitting: *Deleted

## 2020-01-12 NOTE — Progress Notes (Signed)
Pt insurance company has denied Retacrit injections despite several peer to peers being completed.  Per MD at this time pt will not receive injection and will return to the office in 1 month for lab and f/u with MD to review labs and future tx options. RN attempt to contact pt to notify pt of plan, no answer.  LVM to return call to the office.

## 2020-01-22 ENCOUNTER — Other Ambulatory Visit: Payer: Managed Care, Other (non HMO)

## 2020-01-22 DIAGNOSIS — Z20822 Contact with and (suspected) exposure to covid-19: Secondary | ICD-10-CM

## 2020-01-25 ENCOUNTER — Telehealth: Payer: Self-pay

## 2020-01-25 ENCOUNTER — Other Ambulatory Visit: Payer: Self-pay

## 2020-01-25 ENCOUNTER — Telehealth: Payer: Self-pay | Admitting: Hematology and Oncology

## 2020-01-25 DIAGNOSIS — D508 Other iron deficiency anemias: Secondary | ICD-10-CM

## 2020-01-25 NOTE — Progress Notes (Signed)
I left pt a voicemail to call the office back tomorrow morning to schedule a lab appt.

## 2020-01-25 NOTE — Telephone Encounter (Signed)
Called patient to discuss nurse visit scheduled for Shingrix vaccine on 01/24/21.Pt has received vaccine and does not need to get an extra dose. During the conversation pt asked if PCP could put in an order to have Hgb drawn. Please advise.

## 2020-01-25 NOTE — Telephone Encounter (Signed)
Rescheduled appt due to provider's PAL. Patient requested labs to be done w/PCP. Labs cancelled for here. Patient will bring records to appt. Patient is aware of changes.

## 2020-01-26 ENCOUNTER — Ambulatory Visit: Payer: Managed Care, Other (non HMO)

## 2020-01-26 LAB — NOVEL CORONAVIRUS, NAA: SARS-CoV-2, NAA: NOT DETECTED

## 2020-02-11 ENCOUNTER — Other Ambulatory Visit: Payer: Self-pay

## 2020-02-11 ENCOUNTER — Ambulatory Visit (AMBULATORY_SURGERY_CENTER): Payer: Self-pay

## 2020-02-11 VITALS — Ht 69.0 in | Wt 198.0 lb

## 2020-02-11 DIAGNOSIS — Z1211 Encounter for screening for malignant neoplasm of colon: Secondary | ICD-10-CM

## 2020-02-11 MED ORDER — PLENVU 140 G PO SOLR: 1.0000 | ORAL | 0 refills | Status: DC

## 2020-02-11 NOTE — Progress Notes (Signed)
No egg or soy allergy known to patient  °No issues with past sedation with any surgeries or procedures °No intubation problems in the past  °No FH of Malignant Hyperthermia °No diet pills per patient °No home 02 use per patient  °No blood thinners per patient  °Pt denies issues with constipation  °No A fib or A flutter  °EMMI video via MyChart  °COVID 19 guidelines implemented in PV today with Pt and RN  °Pt is fully vaccinated  for Covid  °Coupon given to pt in PV today , Code to Pharmacy  °Due to the COVID-19 pandemic we are asking patients to follow certain guidelines.  °Pt aware of COVID protocols and [facility] guidelines  ° °

## 2020-02-16 NOTE — Progress Notes (Signed)
Patient Care Team: [name], [name], MD as PCP - General (Family Medicine)  DIAGNOSIS:    ICD-10-CM   1. Anemia of renal disease  N18.9    D63.1     CHIEF COMPLIANT: Follow-up of anemia  INTERVAL HISTORY: Karen [name] is a 60 y.o. with above-mentioned history of anemiaon treatment with Retacrit. At-home Retacrit was denied by her insurance company after several appeals.She presentsto the clinic today for follow-up to review her labs and treatment options.  Her insurance has not approved Retacrit injections and therefore we did not give it in November better in spite of that her hemoglobin has remained stable.  She has not noticed any increase symptoms.  She was started on Plaquenil in October.  She thinks that the Plaquenil may be helping her hemoglobin levels but it is unclear  ALLERGIES:  is allergic to milk-related compounds.  MEDICATIONS:  Current Outpatient Medications  Medication Sig Dispense Refill   amLODipine (NORVASC) 5 MG tablet TAKE 1/2 TABLET BY MOUTH DAILY 15 tablet 5   buPROPion (WELLBUTRIN XL) 150 MG 24 hr tablet Take 150 mg by mouth daily.     epoetin alfa-epbx (RETACRIT) 30076 UNIT/ML injection Inject 1 mL (40,000 Units total) into the skin every 30 (thirty) days. Self administer subcutaneously every 60 days after receiving lab work. (Patient not taking: Reported on 02/11/2020) 1.8 mL 3   escitalopram (LEXAPRO) 20 MG tablet Take 20 mg by mouth daily.     FOLIC ACID PO Take by mouth daily.     hydroquinone 4 % cream Apply topically at bedtime.     hydroxychloroquine (PLAQUENIL) 200 MG tablet Take 1 tablet by mouth in the morning and at bedtime.     levothyroxine (SYNTHROID) 125 MCG tablet TAKE 1 TABLET BY MOUTH EVERY DAY 30 tablet 5   omeprazole (PRILOSEC) 20 MG capsule TAKE 1 CAPSULE BY MOUTH EVERY DAY 30 capsule 5   PEG-KCl-NaCl-NaSulf-Na Asc-C (PLENVU) 140 [name] SOLR Take 1 kit by mouth as directed. Manufacturer's coupon Universal coupon code:BIN: P2366821;  GROUP: [id number]; PCN: [id number]; ID: [id number]; PAY NO MORE $50; NO prior authorization 1 each 0   risperiDONE (RISPERDAL) 1 MG tablet Take 1 mg by mouth at bedtime.  1   Vitamin D, Ergocalciferol, (DRISDOL) 1.25 MG (50000 UNIT) CAPS capsule TAKE 1 CAPSULE BY MOUTH EVERY 14 (FOURTEEN) DAYS. FOR 8 WEEKS. THEN EVERY 2 WEEKS. 2 capsule 11   No current facility-administered medications for this visit.    PHYSICAL EXAMINATION: ECOG PERFORMANCE STATUS: 1 - Symptomatic but completely ambulatory  Vitals:   02/17/20 0809  BP: 136/79  Pulse: 73  Resp: 17  Temp: (!) 97.3 F (36.3 C)  SpO2: 100%   Filed Weights   02/17/20 0809  Weight: 195 lb 8 oz (88.7 kg)     LABORATORY DATA:  I have reviewed the data as listed CMP Latest Ref Rng & Units 09/02/2019 09/01/2019 07/02/2019  Glucose 65 - 99 mg/dL 68 64(L) 86  BUN 8 - 27 mg/dL '9 11 9  ' Creatinine 0.57 - 1.00 mg/dL 1.34(H) 1.48(H) 1.00  Sodium 134 - 144 mmol/L 138 133(L) 137  Potassium 3.5 - 5.2 mmol/L 4.3 4.1 3.7  Chloride 96 - 106 mmol/L 98 100 107  CO2 20 - 29 mmol/L 19(L) 19(L) 20(L)  Calcium 8.7 - 10.3 mg/dL 9.7 10.0 9.0  Total Protein 6.0 - 8.5 [name]/dL 7.7 7.5 7.2  Total Bilirubin 0.0 - 1.2 mg/dL 0.6 1.1 0.5  Alkaline Phos 48 - 121 IU/L 87  77 92  AST 0 - 40 IU/L 105(H) 106(H) 29  ALT 0 - 32 IU/L 48(H) 48(H) 21    Lab Results  Component Value Date   WBC 4.0 02/17/2020   HGB 9.3 (L) 02/17/2020   HCT 29.9 (L) 02/17/2020   MCV 92.9 02/17/2020   PLT 235 02/17/2020   NEUTROABS 1.8 02/17/2020    ASSESSMENT & PLAN:  Anemia of renal disease Previous iron infusion: June 2018 ESA therapy: Previously received Aranesp 2-3 times per year from 2014(with Dr. Marin Olp) Current treatment: Retacrit 20,000 units monthly started 10/02/2018(2 doses received so far) goal: Hemoglobin of 10 or above  Bone marrow biopsy performed because of anemia and leukopenia: Normocellular bone marrow with trilineage hematopoiesis with mild megakaryocytic  atypia  Lab review: 07/21/2019: Hemoglobin 9.2, WBC was 5.2 and platelet count 203 09/01/2019: Hb 9, WBC 2.2 ANC 1.2 12/31/2019: Hemoglobin 9.1 (Retacrit was not given because of insurance issues) 02/17/2020: Hemoglobin 9.3 I recommended holding off on Retacrit injections at this time. Return to clinic in 3 months with labs   No orders of the defined types were placed in this encounter.  The patient has a good understanding of the overall plan. she agrees with it. she will call with any problems that may develop before the next visit here.  Total time spent: 20 mins including face to face time and time spent for planning, charting and coordination of care  Nicholas Lose, MD 02/17/2020  I, Cloyde Reams Dorshimer, am acting as scribe for Dr. Nicholas Lose.  I have reviewed the above documentation for accuracy and completeness, and I agree with the above.

## 2020-02-17 ENCOUNTER — Inpatient Hospital Stay: Payer: Managed Care, Other (non HMO) | Attending: Hematology & Oncology

## 2020-02-17 ENCOUNTER — Other Ambulatory Visit: Payer: Self-pay

## 2020-02-17 ENCOUNTER — Telehealth: Payer: Self-pay | Admitting: Hematology and Oncology

## 2020-02-17 ENCOUNTER — Inpatient Hospital Stay (HOSPITAL_BASED_OUTPATIENT_CLINIC_OR_DEPARTMENT_OTHER): Payer: Managed Care, Other (non HMO) | Admitting: Hematology and Oncology

## 2020-02-17 ENCOUNTER — Other Ambulatory Visit: Payer: Self-pay | Admitting: Hematology and Oncology

## 2020-02-17 DIAGNOSIS — D500 Iron deficiency anemia secondary to blood loss (chronic): Secondary | ICD-10-CM

## 2020-02-17 DIAGNOSIS — D631 Anemia in chronic kidney disease: Secondary | ICD-10-CM

## 2020-02-17 DIAGNOSIS — N189 Chronic kidney disease, unspecified: Secondary | ICD-10-CM | POA: Insufficient documentation

## 2020-02-17 DIAGNOSIS — D72819 Decreased white blood cell count, unspecified: Secondary | ICD-10-CM | POA: Diagnosis not present

## 2020-02-17 DIAGNOSIS — Z79899 Other long term (current) drug therapy: Secondary | ICD-10-CM | POA: Insufficient documentation

## 2020-02-17 LAB — CBC WITH DIFFERENTIAL (CANCER CENTER ONLY)
Abs Immature Granulocytes: 0.01 10*3/uL (ref 0.00–0.07)
Basophils Absolute: 0 10*3/uL (ref 0.0–0.1)
Basophils Relative: 1 %
Eosinophils Absolute: 0.1 10*3/uL (ref 0.0–0.5)
Eosinophils Relative: 3 %
HCT: 29.9 % — ABNORMAL LOW (ref 36.0–46.0)
Hemoglobin: 9.3 g/dL — ABNORMAL LOW (ref 12.0–15.0)
Immature Granulocytes: 0 %
Lymphocytes Relative: 40 %
Lymphs Abs: 1.6 10*3/uL (ref 0.7–4.0)
MCH: 28.9 pg (ref 26.0–34.0)
MCHC: 31.1 g/dL (ref 30.0–36.0)
MCV: 92.9 fL (ref 80.0–100.0)
Monocytes Absolute: 0.4 10*3/uL (ref 0.1–1.0)
Monocytes Relative: 10 %
Neutro Abs: 1.8 10*3/uL (ref 1.7–7.7)
Neutrophils Relative %: 46 %
Platelet Count: 235 10*3/uL (ref 150–400)
RBC: 3.22 MIL/uL — ABNORMAL LOW (ref 3.87–5.11)
RDW: 14.1 % (ref 11.5–15.5)
WBC Count: 4 10*3/uL (ref 4.0–10.5)
nRBC: 0 % (ref 0.0–0.2)

## 2020-02-17 LAB — RETICULOCYTES
Immature Retic Fract: 14.1 % (ref 2.3–15.9)
RBC.: 3.18 MIL/uL — ABNORMAL LOW (ref 3.87–5.11)
Retic Count, Absolute: 52.8 10*3/uL (ref 19.0–186.0)
Retic Ct Pct: 1.7 % (ref 0.4–3.1)

## 2020-02-17 LAB — IRON AND TIBC
Iron: 79 ug/dL (ref 41–142)
Saturation Ratios: 31 % (ref 21–57)
TIBC: 253 ug/dL (ref 236–444)
UIBC: 174 ug/dL (ref 120–384)

## 2020-02-17 LAB — FERRITIN: Ferritin: 196 ng/mL (ref 11–307)

## 2020-02-17 NOTE — Telephone Encounter (Signed)
Scheduled appointments per 1/13 los. Spoke to patient who is aware of appointments date and times.  

## 2020-02-17 NOTE — Assessment & Plan Note (Signed)
Previous iron infusion: June 2018 ESA therapy: Previously received Aranesp 2-3 times per year from 2014(with Dr. Marin Olp) Current treatment: Retacrit 20,000 units monthly started 10/02/2018(2 doses received so far) goal: Hemoglobin of 10 or above  Bone marrow biopsy performed because of anemia and leukopenia: Normocellular bone marrow with trilineage hematopoiesis with mild megakaryocytic atypia  Lab review: 07/21/2019: Hemoglobin 9.2, WBC was 5.2 and platelet count 203 09/01/2019: Hb 9, WBC 2.2 ANC 1.2 12/31/2019: Hemoglobin 9.1 (Retacrit was not given because of insurance issues) 02/17/2020:

## 2020-02-21 ENCOUNTER — Encounter: Payer: Self-pay | Admitting: Gastroenterology

## 2020-02-24 ENCOUNTER — Ambulatory Visit: Payer: Managed Care, Other (non HMO) | Admitting: Hematology and Oncology

## 2020-02-25 ENCOUNTER — Encounter: Payer: Managed Care, Other (non HMO) | Admitting: Gastroenterology

## 2020-03-03 ENCOUNTER — Other Ambulatory Visit: Payer: Managed Care, Other (non HMO)

## 2020-03-03 ENCOUNTER — Ambulatory Visit: Payer: Managed Care, Other (non HMO)

## 2020-03-03 ENCOUNTER — Ambulatory Visit: Payer: Managed Care, Other (non HMO) | Admitting: Hematology and Oncology

## 2020-03-09 ENCOUNTER — Other Ambulatory Visit: Payer: Self-pay | Admitting: Family Medicine

## 2020-03-10 ENCOUNTER — Ambulatory Visit (AMBULATORY_SURGERY_CENTER): Payer: Managed Care, Other (non HMO) | Admitting: Gastroenterology

## 2020-03-10 ENCOUNTER — Encounter: Payer: Self-pay | Admitting: Gastroenterology

## 2020-03-10 ENCOUNTER — Other Ambulatory Visit: Payer: Self-pay

## 2020-03-10 VITALS — BP 126/62 | HR 59 | Temp 97.3°F | Resp 12 | Ht 69.0 in | Wt 198.0 lb

## 2020-03-10 DIAGNOSIS — D120 Benign neoplasm of cecum: Secondary | ICD-10-CM | POA: Diagnosis not present

## 2020-03-10 DIAGNOSIS — Z1211 Encounter for screening for malignant neoplasm of colon: Secondary | ICD-10-CM

## 2020-03-10 DIAGNOSIS — D125 Benign neoplasm of sigmoid colon: Secondary | ICD-10-CM | POA: Diagnosis not present

## 2020-03-10 MED ORDER — SODIUM CHLORIDE 0.9 % IV SOLN: 500.0000 mL | Freq: Once | INTRAVENOUS | Status: DC

## 2020-03-10 NOTE — Progress Notes (Signed)
Called to room to assist during endoscopic procedure.  Patient ID and intended procedure confirmed with present staff. Received instructions for my participation in the procedure from the performing physician.  

## 2020-03-10 NOTE — Op Note (Signed)
[facility] Patient Name: Karen [name] Procedure Date: 03/10/2020 3:35 PM MRN: [id number] Endoscopist: Ladene Artist , MD Age: 60 Referring MD:  Date of Birth: 1960-04-19 Gender: Female Account #: [id number] Procedure:                Colonoscopy Indications:              Screening for colorectal malignant neoplasm Medicines:                Monitored Anesthesia Care Procedure:                Pre-Anesthesia Assessment:                           - Prior to the procedure, a History and Physical                            was performed, and patient medications and                            allergies were reviewed. The patient's tolerance of                            previous anesthesia was also reviewed. The risks                            and benefits of the procedure and the sedation                            options and risks were discussed with the patient.                            All questions were answered, and informed consent                            was obtained. Prior Anticoagulants: The patient has                            taken no previous anticoagulant or antiplatelet                            agents. ASA Grade Assessment: II - A patient with                            mild systemic disease. After reviewing the risks                            and benefits, the patient was deemed in                            satisfactory condition to undergo the procedure.                           After obtaining informed consent, the colonoscope  was passed under direct vision. Throughout the                            procedure, the patient's blood pressure, pulse, and                            oxygen saturations were monitored continuously. The                            Olympus CF-HQ190L [id number]) Colonoscope was                            introduced through the anus and advanced to the the                            cecum, identified by  appendiceal orifice and                            ileocecal valve. The ileocecal valve, appendiceal                            orifice, and rectum were photographed. The quality                            of the bowel preparation was good. The colonoscopy                            was performed without difficulty. The patient                            tolerated the procedure well. Scope In: 3:42:55 PM Scope Out: 4:09:03 PM Scope Withdrawal Time: 0 hours 21 minutes 12 seconds  Total Procedure Duration: 0 hours 26 minutes 8 seconds  Findings:                 The perianal and digital rectal examinations were                            normal.                           A 7 mm polyp was found in the cecum. The polyp was                            sessile. The polyp was removed with a cold snare.                            Resection and retrieval were complete.                           Five sessile polyps were found in the distal                            sigmoid colon. The polyps were 4 to 5 mm in size.  These polyps were removed with a cold snare.                            Resection and retrieval were complete.                           A few small-mouthed diverticula were found in the                            sigmoid colon.                           The exam was otherwise without abnormality on                            direct and retroflexion views. Complications:            No immediate complications. Estimated blood loss:                            None. Estimated Blood Loss:     Estimated blood loss: none. Impression:               - One 7 mm polyp in the cecum, removed with a cold                            snare. Resected and retrieved.                           - Five 4 to 5 mm polyps in the distal sigmoid                            colon, removed with a cold snare. Resected and                            retrieved.                           -  Diverticulosis in the sigmoid colon.                           - The examination was otherwise normal on direct                            and retroflexion views. Recommendation:           - Repeat colonoscopy after studies are complete for                            surveillance based on pathology results.                           - Patient has a contact number available for                            emergencies. The signs and symptoms of potential  delayed complications were discussed with the                            patient. Return to normal activities tomorrow.                            Written discharge instructions were provided to the                            patient.                           - Resume previous diet.                           - Continue present medications.                           - Await pathology results. Ladene Artist, MD 03/10/2020 4:12:43 PM This report has been signed electronically.

## 2020-03-10 NOTE — Patient Instructions (Signed)
Handouts given:  Diverticulosis, Polyps Resume previous diet  Continue current medications Await pathology results  YOU HAD AN ENDOSCOPIC PROCEDURE TODAY AT THE [facility]:   Refer to the procedure report that was given to you for any specific questions about what was found during the examination.  If the procedure report does not answer your questions, please call your gastroenterologist to clarify.  If you requested that your care partner not be given the details of your procedure findings, then the procedure report has been included in a sealed envelope for you to review at your convenience later.  YOU SHOULD EXPECT: Some feelings of bloating in the abdomen. Passage of more gas than usual.  Walking can help get rid of the air that was put into your GI tract during the procedure and reduce the bloating. If you had a lower endoscopy (such as a colonoscopy or flexible sigmoidoscopy) you may notice spotting of blood in your stool or on the toilet paper. If you underwent a bowel prep for your procedure, you may not have a normal bowel movement for a few days.  Please Note:  You might notice some irritation and congestion in your nose or some drainage.  This is from the oxygen used during your procedure.  There is no need for concern and it should clear up in a day or so.  SYMPTOMS TO REPORT IMMEDIATELY:   Following lower endoscopy (colonoscopy or flexible sigmoidoscopy):  Excessive amounts of blood in the stool  Significant tenderness or worsening of abdominal pains  Swelling of the abdomen that is new, acute  Fever of 100F or higher  For urgent or emergent issues, a gastroenterologist can be reached at any hour by calling [phone]. Do not use MyChart messaging for urgent concerns.   DIET:  We do recommend a small meal at first, but then you may proceed to your regular diet.  Drink plenty of fluids but you should avoid alcoholic beverages for 24 hours.  ACTIVITY:  You  should plan to take it easy for the rest of today and you should NOT DRIVE or use heavy machinery until tomorrow (because of the sedation medicines used during the test).    FOLLOW UP: Our staff will call the number listed on your records 48-72 hours following your procedure to check on you and address any questions or concerns that you may have regarding the information given to you following your procedure. If we do not reach you, we will leave a message.  We will attempt to reach you two times.  During this call, we will ask if you have developed any symptoms of COVID 19. If you develop any symptoms (ie: fever, flu-like symptoms, shortness of breath, cough etc.) before then, please call [phone].  If you test positive for Covid 19 in the 2 weeks post procedure, please call and report this information to us.    If any biopsies were taken you will be contacted by phone or by letter within the next 1-3 weeks.  Please call us at [phone] if you have not heard about the biopsies in 3 weeks.   SIGNATURES/CONFIDENTIALITY: You and/or your care partner have signed paperwork which will be entered into your electronic medical record.  These signatures attest to the fact that that the information above on your After Visit Summary has been reviewed and is understood.  Full responsibility of the confidentiality of this discharge information lies with you and/or your care-partner. 

## 2020-03-10 NOTE — Progress Notes (Signed)
VS by SB.  previsit completed by BG.  No changes to health hx.

## 2020-03-10 NOTE — Progress Notes (Signed)
Report given to PACU, vss 

## 2020-03-14 ENCOUNTER — Telehealth: Payer: Self-pay

## 2020-03-14 NOTE — Telephone Encounter (Signed)
  Follow up Call-  Call back number 03/10/2020  Post procedure Call Back phone  # [phone]  Permission to leave phone message Yes  Some recent data might be hidden     Patient questions:  Do you have a fever, pain , or abdominal swelling? No. Pain Score  0 *  Have you tolerated food without any problems? Yes.    Have you been able to return to your normal activities? Yes.    Do you have any questions about your discharge instructions: Diet   No. Medications  No. Follow up visit  No.  Do you have questions or concerns about your Care? No.  Actions: * If pain score is 4 or above: No action needed, pain <4. 1. Have you developed a fever since your procedure? no  2.   Have you had an respiratory symptoms (SOB or cough) since your procedure? no  3.   Have you tested positive for COVID 19 since your procedure no  4.   Have you had any family members/close contacts diagnosed with the COVID 19 since your procedure?  no   If yes to any of these questions please route to Joylene John, RN and Joella Prince, RN

## 2020-03-29 ENCOUNTER — Encounter: Payer: Self-pay | Admitting: Gastroenterology

## 2020-04-27 ENCOUNTER — Other Ambulatory Visit: Payer: Self-pay | Admitting: Family Medicine

## 2020-04-27 DIAGNOSIS — I10 Essential (primary) hypertension: Secondary | ICD-10-CM

## 2020-05-16 NOTE — Progress Notes (Signed)
Patient Care Team: [name], [name], MD as PCP - General (Family Medicine)  DIAGNOSIS:    ICD-10-CM   1. Anemia of renal disease  N18.9    D63.1     CHIEF COMPLIANT: Follow-up of anemia  INTERVAL HISTORY: Karen [name] is a 60 y.o. with above-mentioned history of anemiaontreatmentwith Retacrit and Plaquenil.Shepresentsto the clinictoday for follow-up.  She does not complain of any fatigue or shortness of breath exertion denies any dizziness or lightheadedness.  ALLERGIES:  is allergic to milk-related compounds.  MEDICATIONS:  Current Outpatient Medications  Medication Sig Dispense Refill  . amLODipine (NORVASC) 5 MG tablet TAKE 1/2 TABLET BY MOUTH DAILY 15 tablet 5  . buPROPion (WELLBUTRIN XL) 150 MG 24 hr tablet Take 150 mg by mouth daily.    Marland Kitchen epoetin alfa-epbx (RETACRIT) 66599 UNIT/ML injection Inject 1 mL (40,000 Units total) into the skin every 30 (thirty) days. Self administer subcutaneously every 60 days after receiving lab work. (Patient not taking: No sig reported) 1.8 mL 3  . escitalopram (LEXAPRO) 20 MG tablet Take 20 mg by mouth daily.    Marland Kitchen FOLIC ACID PO Take by mouth daily.    . hydroquinone 4 % cream Apply topically at bedtime.    . hydroxychloroquine (PLAQUENIL) 200 MG tablet Take 1 tablet by mouth in the morning and at bedtime.    Marland Kitchen levothyroxine (SYNTHROID) 125 MCG tablet TAKE 1 TABLET BY MOUTH EVERY DAY 90 tablet 1  . omeprazole (PRILOSEC) 20 MG capsule TAKE 1 CAPSULE BY MOUTH EVERY DAY 30 capsule 5  . risperiDONE (RISPERDAL) 1 MG tablet Take 1 mg by mouth at bedtime.  1  . Vitamin D, Ergocalciferol, (DRISDOL) 1.25 MG (50000 UNIT) CAPS capsule TAKE 1 CAPSULE BY MOUTH EVERY 14 (FOURTEEN) DAYS. FOR 8 WEEKS. THEN EVERY 2 WEEKS. 2 capsule 11   No current facility-administered medications for this visit.    PHYSICAL EXAMINATION: ECOG PERFORMANCE STATUS: 0 - Asymptomatic  Vitals:   05/17/20 0827  BP: 120/64  Pulse: 76  Resp: 18  Temp: (!) 97.5 F (36.4  C)  SpO2: 100%   Filed Weights   05/17/20 0827  Weight: 194 lb 6.4 oz (88.2 kg)     LABORATORY DATA:  I have reviewed the data as listed CMP Latest Ref Rng & Units 09/02/2019 09/01/2019 07/02/2019  Glucose 65 - 99 mg/dL 68 64(L) 86  BUN 8 - 27 mg/dL '9 11 9  ' Creatinine 0.57 - 1.00 mg/dL 1.34(H) 1.48(H) 1.00  Sodium 134 - 144 mmol/L 138 133(L) 137  Potassium 3.5 - 5.2 mmol/L 4.3 4.1 3.7  Chloride 96 - 106 mmol/L 98 100 107  CO2 20 - 29 mmol/L 19(L) 19(L) 20(L)  Calcium 8.7 - 10.3 mg/dL 9.7 10.0 9.0  Total Protein 6.0 - 8.5 g/dL 7.7 7.5 7.2  Total Bilirubin 0.0 - 1.2 mg/dL 0.6 1.1 0.5  Alkaline Phos 48 - 121 IU/L 87 77 92  AST 0 - 40 IU/L 105(H) 106(H) 29  ALT 0 - 32 IU/L 48(H) 48(H) 21    Lab Results  Component Value Date   WBC 2.9 (L) 05/17/2020   HGB 10.1 (L) 05/17/2020   HCT 31.3 (L) 05/17/2020   MCV 91.0 05/17/2020   PLT 192 05/17/2020   NEUTROABS 1.3 (L) 05/17/2020    ASSESSMENT & PLAN:  Anemia of renal disease Previous iron infusion: June 2018 ESA therapy: Previously received Aranesp 2-3 times per year from 2014(with Dr. Marin Olp) Current treatment: Retacrit 20,000 units monthly started 10/02/2018(2 doses  received so far) goal: Hemoglobin of 10 or above  Bone marrow biopsy performed because of anemia and leukopenia: Normocellular bone marrow with trilineage hematopoiesis with mild megakaryocytic atypia  Lab review: 07/21/2019: Hemoglobin 9.2,WBC was 5.2 and platelet count 203 09/01/2019:Hb 9, WBC 2.2 ANC 1.2 12/31/2019: Hemoglobin 9.1 (Retacrit was not given because of insurance issues) 02/17/2020: Hemoglobin 9.3 05/17/20: Hemoglobin 10.1, ANC 1.3  I discussed with her she does not need Aranesp injections. Return to clinic in 6 months with labs     No orders of the defined types were placed in this encounter.  The patient has a good understanding of the overall plan. she agrees with it. she will call with any problems that may develop before the next  visit here.  Total time spent: 20 mins including face to face time and time spent for planning, charting and coordination of care  Rulon Eisenmenger, MD, MPH 05/17/2020  I, Molly Dorshimer, am acting as scribe for Dr. Nicholas Lose.  I have reviewed the above documentation for accuracy and completeness, and I agree with the above.

## 2020-05-16 NOTE — Assessment & Plan Note (Signed)
Previous iron infusion: June 2018 ESA therapy: Previously received Aranesp 2-3 times per year from 2014(with Dr. Marin Olp) Current treatment: Retacrit 20,000 units monthly started 10/02/2018(2 doses received so far) goal: Hemoglobin of 10 or above  Bone marrow biopsy performed because of anemia and leukopenia: Normocellular bone marrow with trilineage hematopoiesis with mild megakaryocytic atypia  Lab review: 07/21/2019: Hemoglobin 9.2,WBC was 5.2 and platelet count 203 09/01/2019:Hb 9, WBC 2.2 ANC 1.2 12/31/2019: Hemoglobin 9.1 (Retacrit was not given because of insurance issues) 02/17/2020: Hemoglobin 9.3 05/17/20:   Return to clinic in 3 months with labs

## 2020-05-17 ENCOUNTER — Other Ambulatory Visit: Payer: Self-pay

## 2020-05-17 ENCOUNTER — Inpatient Hospital Stay (HOSPITAL_BASED_OUTPATIENT_CLINIC_OR_DEPARTMENT_OTHER): Payer: Managed Care, Other (non HMO) | Admitting: Hematology and Oncology

## 2020-05-17 ENCOUNTER — Inpatient Hospital Stay: Payer: Managed Care, Other (non HMO) | Attending: Hematology & Oncology

## 2020-05-17 DIAGNOSIS — N189 Chronic kidney disease, unspecified: Secondary | ICD-10-CM | POA: Insufficient documentation

## 2020-05-17 DIAGNOSIS — Z79899 Other long term (current) drug therapy: Secondary | ICD-10-CM | POA: Diagnosis not present

## 2020-05-17 DIAGNOSIS — D631 Anemia in chronic kidney disease: Secondary | ICD-10-CM | POA: Diagnosis present

## 2020-05-17 LAB — RETICULOCYTES
Immature Retic Fract: 10.5 % (ref 2.3–15.9)
RBC.: 3.54 MIL/uL — ABNORMAL LOW (ref 3.87–5.11)
Retic Count, Absolute: 43.2 10*3/uL (ref 19.0–186.0)
Retic Ct Pct: 1.2 % (ref 0.4–3.1)

## 2020-05-17 LAB — CBC WITH DIFFERENTIAL (CANCER CENTER ONLY)
Abs Immature Granulocytes: 0 10*3/uL (ref 0.00–0.07)
Basophils Absolute: 0 10*3/uL (ref 0.0–0.1)
Basophils Relative: 0 %
Eosinophils Absolute: 0.1 10*3/uL (ref 0.0–0.5)
Eosinophils Relative: 2 %
HCT: 31.3 % — ABNORMAL LOW (ref 36.0–46.0)
Hemoglobin: 10.1 g/dL — ABNORMAL LOW (ref 12.0–15.0)
Immature Granulocytes: 0 %
Lymphocytes Relative: 42 %
Lymphs Abs: 1.2 10*3/uL (ref 0.7–4.0)
MCH: 29.4 pg (ref 26.0–34.0)
MCHC: 32.3 g/dL (ref 30.0–36.0)
MCV: 91 fL (ref 80.0–100.0)
Monocytes Absolute: 0.3 10*3/uL (ref 0.1–1.0)
Monocytes Relative: 11 %
Neutro Abs: 1.3 10*3/uL — ABNORMAL LOW (ref 1.7–7.7)
Neutrophils Relative %: 45 %
Platelet Count: 192 10*3/uL (ref 150–400)
RBC: 3.44 MIL/uL — ABNORMAL LOW (ref 3.87–5.11)
RDW: 12.7 % (ref 11.5–15.5)
WBC Count: 2.9 10*3/uL — ABNORMAL LOW (ref 4.0–10.5)
nRBC: 0 % (ref 0.0–0.2)

## 2020-05-17 LAB — IRON AND TIBC
Iron: 60 ug/dL (ref 41–142)
Saturation Ratios: 22 % (ref 21–57)
TIBC: 271 ug/dL (ref 236–444)
UIBC: 211 ug/dL (ref 120–384)

## 2020-05-17 LAB — FERRITIN: Ferritin: 173 ng/mL (ref 11–307)

## 2020-05-23 ENCOUNTER — Telehealth: Payer: Self-pay | Admitting: Hematology and Oncology

## 2020-05-23 NOTE — Telephone Encounter (Signed)
Scheduled per 4/13 los. Called and spoke with pt confirmed 10/13 appts

## 2020-05-24 ENCOUNTER — Ambulatory Visit: Payer: Managed Care, Other (non HMO) | Admitting: Family Medicine

## 2020-05-25 ENCOUNTER — Other Ambulatory Visit: Payer: Self-pay

## 2020-05-25 LAB — COMPREHENSIVE METABOLIC PANEL: Calcium: 9.4 (ref 8.7–10.7)

## 2020-05-25 LAB — BASIC METABOLIC PANEL
BUN: 11 (ref 4–21)
CO2: 21 (ref 13–22)
Chloride: 105 (ref 99–108)
Creatinine: 1.2 — AB (ref 0.5–1.1)
Glucose: 50
Potassium: 3.9 (ref 3.4–5.3)
Sodium: 141 (ref 137–147)

## 2020-05-25 LAB — CBC AND DIFFERENTIAL
HCT: 34 — AB (ref 36–46)
Hemoglobin: 10.6 — AB (ref 12.0–16.0)
Platelets: 205 (ref 150–399)
WBC: 2.7

## 2020-05-25 LAB — CBC: RBC: 3.72 — AB (ref 3.87–5.11)

## 2020-05-25 LAB — HEPATIC FUNCTION PANEL
ALT: 11 (ref 7–35)
AST: 23 (ref 13–35)
Alkaline Phosphatase: 93 (ref 25–125)
Bilirubin, Total: 0.3

## 2020-05-26 ENCOUNTER — Encounter: Payer: Self-pay | Admitting: Family Medicine

## 2020-05-26 ENCOUNTER — Ambulatory Visit (INDEPENDENT_AMBULATORY_CARE_PROVIDER_SITE_OTHER): Payer: Managed Care, Other (non HMO) | Admitting: Family Medicine

## 2020-05-26 VITALS — BP 130/80 | HR 70 | Temp 98.2°F | Resp 16 | Ht 69.0 in | Wt 200.0 lb

## 2020-05-26 DIAGNOSIS — E663 Overweight: Secondary | ICD-10-CM

## 2020-05-26 DIAGNOSIS — M359 Systemic involvement of connective tissue, unspecified: Secondary | ICD-10-CM

## 2020-05-26 DIAGNOSIS — N1831 Chronic kidney disease, stage 3a: Secondary | ICD-10-CM | POA: Diagnosis not present

## 2020-05-26 DIAGNOSIS — I10 Essential (primary) hypertension: Secondary | ICD-10-CM

## 2020-05-26 DIAGNOSIS — M329 Systemic lupus erythematosus, unspecified: Secondary | ICD-10-CM | POA: Insufficient documentation

## 2020-05-26 NOTE — Progress Notes (Signed)
HPI: [name] is a 60 y.o. female, who is here today for 6 months follow up.   She was last seen on 12/29/19. Since her last visit she has followed with rheumatologist. She had blood work done at her rheumatologist's office.  Connective tissue disease, she is on Plaquenil 200 mg bid, which she has tolerated well. Negative for arthralgias and skin rash.  Her anemia has improved. She follows with hematologist regularly, last seen on 05/17/20, 6 months follow up was recommended.  Lab Results  Component Value Date   WBC 2.9 (L) 05/17/2020   HGB 10.1 (L) 05/17/2020   HCT 31.3 (L) 05/17/2020   MCV 91.0 05/17/2020   PLT 192 05/17/2020   HTN: She is on Amlodipine 2.5 mg daily. She is not checking BP at home. Negative for severe/frequent headache, visual changes, chest pain, dyspnea, palpitation, focal weakness, or edema. CKD III: She has not noted gross hematuria, foam in urine,or decreased urine outout.  Lab Results  Component Value Date   CREATININE 1.34 (H) 09/02/2019   BUN 9 09/02/2019   NA 138 09/02/2019   K 4.3 09/02/2019   CL 98 09/02/2019   CO2 19 (L) 09/02/2019   01/27/2020 Cr 1.18, e GFR 58.  She is not exercising regularly. She has not been consistent with following a healthful diet. Loves to eat ice cream, has it almost daily.  Review of Systems  Constitutional: Negative for activity change, appetite change, fatigue and fever.  HENT: Negative for mouth sores, nosebleeds, sore throat and trouble swallowing.   Respiratory: Negative for cough and wheezing.   Cardiovascular: Negative for palpitations.  Gastrointestinal: Negative for abdominal pain, nausea and vomiting.       Negative for changes in bowel habits.  Genitourinary: Negative for hematuria.  Neurological: Negative for syncope and facial asymmetry.  Rest of ROS, see pertinent positives sand negatives in HPI  Current Outpatient Medications on File Prior to Visit  Medication Sig Dispense  Refill  . amLODipine (NORVASC) 5 MG tablet TAKE 1/2 TABLET BY MOUTH DAILY 15 tablet 5  . buPROPion (WELLBUTRIN XL) 150 MG 24 hr tablet Take 150 mg by mouth daily.    Marland Kitchen epoetin alfa-epbx (RETACRIT) 33295 UNIT/ML injection Inject 1 mL (40,000 Units total) into the skin every 30 (thirty) days. Self administer subcutaneously every 60 days after receiving lab work. (Patient not taking: No sig reported) 1.8 mL 3  . escitalopram (LEXAPRO) 20 MG tablet Take 20 mg by mouth daily.    Marland Kitchen FOLIC ACID PO Take by mouth daily.    . hydroquinone 4 % cream Apply topically at bedtime.    . hydroxychloroquine (PLAQUENIL) 200 MG tablet Take 1 tablet by mouth in the morning and at bedtime.    Marland Kitchen levothyroxine (SYNTHROID) 125 MCG tablet TAKE 1 TABLET BY MOUTH EVERY DAY 90 tablet 1  . omeprazole (PRILOSEC) 20 MG capsule TAKE 1 CAPSULE BY MOUTH EVERY DAY 30 capsule 5  . risperiDONE (RISPERDAL) 1 MG tablet Take 1 mg by mouth at bedtime.  1  . Vitamin D, Ergocalciferol, (DRISDOL) 1.25 MG (50000 UNIT) CAPS capsule TAKE 1 CAPSULE BY MOUTH EVERY 14 (FOURTEEN) DAYS. FOR 8 WEEKS. THEN EVERY 2 WEEKS. 2 capsule 11   No current facility-administered medications on file prior to visit.   Past Medical History:  Diagnosis Date  . 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 Barrett esophagus   . Depression  hx of  . DUB (dysfunctional uterine bleeding)   . GERD (gastroesophageal reflux disease)    on meds  . Graves disease   . Hypertension    on meds  . Hypothyroidism    on meds   Allergies  Allergen Reactions  . Milk-Related Compounds Other (See Comments)    Intolerance to milk products-causes gas    Social History   Socioeconomic History  . Marital status: Married    Spouse name: Not on file  . Number of children: 0  . Years of education: Not on file  . Highest education level: Not on file  Occupational History    Employer: [employer]  Tobacco Use  . Smoking status: Former Smoker     Packs/day: 1.00    Years: 20.00    Pack years: 20.00    Types: Cigarettes    Start date: 03/13/1967    Quit date: 07/14/1987    Years since quitting: 32.8  . Smokeless tobacco: Never Used  . Tobacco comment: quit 26 years ago  Vaping Use  . Vaping Use: Never used  Substance and Sexual Activity  . Alcohol use: No    Alcohol/week: 0.0 standard drinks  . Drug use: No  . Sexual activity: Not on file  Other Topics Concern  . Not on file  Social History Narrative   Occupation: Estate agentorklift operator   Regular exercise- yes   0 caffeine drinks    Social Determinants of Corporate investment bankerHealth   Financial Resource Strain: Not on file  Food Insecurity: Not on file  Transportation Needs: Not on file  Physical Activity: Not on file  Stress: Not on file  Social Connections: Not on file    Vitals:   05/26/20 1114  BP: 130/80  Pulse: 70  Resp: 16  Temp: 98.2 F (36.8 C)  SpO2: 96%   Wt Readings from Last 3 Encounters:  05/26/20 200 lb (90.7 kg)  05/17/20 194 lb 6.4 oz (88.2 kg)  03/10/20 198 lb (89.8 kg)   Body mass index is 29.53 kg/m.  Physical Exam Vitals and nursing note reviewed.  Constitutional:      General: She is not in acute distress.    Appearance: She is well-developed.  HENT:     Head: Normocephalic and atraumatic.     Mouth/Throat:     Mouth: Mucous membranes are moist.     Pharynx: Oropharynx is clear.  Eyes:     Conjunctiva/sclera: Conjunctivae normal.  Cardiovascular:     Rate and Rhythm: Normal rate and regular rhythm.     Pulses:          Dorsalis pedis pulses are 2+ on the right side and 2+ on the left side.     Heart sounds: No murmur heard.   Pulmonary:     Effort: Pulmonary effort is normal. No respiratory distress.     Breath sounds: Normal breath sounds.  Abdominal:     Palpations: Abdomen is soft. There is no hepatomegaly or mass.     Tenderness: There is no abdominal tenderness.  Lymphadenopathy:     Cervical: No cervical adenopathy.  Skin:    General:  Skin is warm.     Findings: No erythema or rash.  Neurological:     Mental Status: She is alert and oriented to person, place, and time.     Cranial Nerves: No cranial nerve deficit.     Gait: Gait normal.  Psychiatric:     Comments: Well groomed, good eye contact.  ASSESSMENT AND PLAN:  Ms. Karen [name] was seen today for 6 months follow-up.  Diagnoses and all orders for this visit:  Stage 3a chronic kidney disease ([place]) Problem has been stable. Adequate BP controlled. Low salt diet and adequate hydration. Avoid NSAID's.  Hypertension, essential, benign BP adequately controlled. She had blood work at [employer] rheumatologist's office yesterday. Continue Amlodipine 2.5 mg daily. Low salt diet.  Connective tissue disease ([facility]) On Plaquenil 200 mg bid. Eye exam current. Following with rheumatologist.  Overweight (BMI 25.0-29.9) Since her last visit she has gained some wt. We discussed benefits of wt loss as well as adverse effects of obesity. Consistency with healthy diet and physical activity recommended.   Return in about 26 weeks (around 11/24/2020) for cpe.   Eldred Sooy G. [name], MD  [facility]. [place] office.   A few things to remember from today's visit:   Stage 3a chronic kidney disease ([place])  Hypertension, essential, benign  Connective tissue disease ([place])  If you need refills please call your pharmacy. Do not use My Chart to request refills or for acute issues that need immediate attention.   Today we did not do labs because most likely your rheumatologist checked your kidney function yesterday. Since you are going to be seeing other providers, I think it is ok to see you back in 11/2020, then we will do fasting labs. Caution with frequent ice cream.   Please be sure medication list is accurate. If a new problem present, please set up appointment sooner than planned today.

## 2020-05-26 NOTE — Patient Instructions (Addendum)
A few things to remember from today's visit:   Stage 3a chronic kidney disease ([place])  Hypertension, essential, benign  Connective tissue disease ([place])  If you need refills please call your pharmacy. Do not use My Chart to request refills or for acute issues that need immediate attention.   Today we did not do labs because most likely your rheumatologist checked your kidney function yesterday. Since you are going to be seeing other providers, I think it is ok to see you back in 11/2020, then we will do fasting labs. Caution with frequent ice cream.   Please be sure medication list is accurate. If a new problem present, please set up appointment sooner than planned today.

## 2020-05-27 MED ORDER — AMLODIPINE BESYLATE 5 MG PO TABS: 0.5000 | ORAL_TABLET | Freq: Every day | ORAL | 2 refills | Status: DC

## 2020-05-30 ENCOUNTER — Encounter: Payer: Self-pay | Admitting: Family Medicine

## 2020-07-14 ENCOUNTER — Other Ambulatory Visit: Payer: Self-pay | Admitting: Family Medicine

## 2020-07-14 DIAGNOSIS — Z1231 Encounter for screening mammogram for malignant neoplasm of breast: Secondary | ICD-10-CM

## 2020-07-25 ENCOUNTER — Other Ambulatory Visit: Payer: Self-pay | Admitting: Family Medicine

## 2020-07-25 ENCOUNTER — Other Ambulatory Visit: Payer: Self-pay

## 2020-07-25 DIAGNOSIS — E559 Vitamin D deficiency, unspecified: Secondary | ICD-10-CM

## 2020-08-15 ENCOUNTER — Ambulatory Visit (INDEPENDENT_AMBULATORY_CARE_PROVIDER_SITE_OTHER): Payer: Managed Care, Other (non HMO) | Admitting: Family Medicine

## 2020-08-15 ENCOUNTER — Encounter: Payer: Self-pay | Admitting: Family Medicine

## 2020-08-15 ENCOUNTER — Other Ambulatory Visit: Payer: Self-pay

## 2020-08-15 VITALS — BP 128/80 | HR 65 | Temp 97.8°F | Resp 16 | Ht 69.0 in | Wt 197.0 lb

## 2020-08-15 DIAGNOSIS — L299 Pruritus, unspecified: Secondary | ICD-10-CM | POA: Diagnosis not present

## 2020-08-15 DIAGNOSIS — L509 Urticaria, unspecified: Secondary | ICD-10-CM | POA: Diagnosis not present

## 2020-08-15 DIAGNOSIS — E559 Vitamin D deficiency, unspecified: Secondary | ICD-10-CM

## 2020-08-15 LAB — HEPATIC FUNCTION PANEL
ALT: 12 U/L (ref 0–35)
AST: 20 U/L (ref 0–37)
Albumin: 4.2 g/dL (ref 3.5–5.2)
Alkaline Phosphatase: 83 U/L (ref 39–117)
Bilirubin, Direct: 0.1 mg/dL (ref 0.0–0.3)
Total Bilirubin: 0.5 mg/dL (ref 0.2–1.2)
Total Protein: 7.2 g/dL (ref 6.0–8.3)

## 2020-08-15 LAB — VITAMIN D 25 HYDROXY (VIT D DEFICIENCY, FRACTURES): VITD: 47.53 ng/mL (ref 30.00–100.00)

## 2020-08-15 MED ORDER — METHYLPREDNISOLONE ACETATE 40 MG/ML IJ SUSP
40.0000 mg | Freq: Once | INTRAMUSCULAR | Status: AC
  Administered 2020-08-15: 40 mg via INTRAMUSCULAR

## 2020-08-15 MED ORDER — TRIAMCINOLONE ACETONIDE 0.1 % EX CREA: 1.0000 "application " | TOPICAL_CREAM | Freq: Every day | CUTANEOUS | 1 refills | Status: DC | PRN

## 2020-08-15 MED ORDER — HYDROXYZINE HCL 25 MG PO TABS
25.0000 mg | ORAL_TABLET | Freq: Two times a day (BID) | ORAL | 1 refills | Status: DC | PRN
Start: 2020-08-15 — End: 2021-08-27

## 2020-08-15 NOTE — Patient Instructions (Addendum)
A few things to remember from today's visit:   Generalized pruritus - Plan: hydrOXYzine (ATARAX/VISTARIL) 25 MG tablet  Urticaria - Plan: triamcinolone cream (KENALOG) 0.1 %, hydrOXYzine (ATARAX/VISTARIL) 25 MG tablet  If you need refills please call your pharmacy. Today you received Depo medrol 40 mg x 1.  Do not use My Chart to request refills or for acute issues that need immediate attention.   Continue adequate moisturized skin. Triamcinolone daily as needed. Hydroxyzine causes drowsiness, so you can take it at night and take Zyrtec 10 mg daily in the morning. Please sign a release form ,so we can obtain copy of recent labs, most likely your liver was checked.  Please be sure medication list is accurate. If a new problem present, please set up appointment sooner than planned today.

## 2020-08-15 NOTE — Progress Notes (Signed)
Chief Complaint  Patient presents with   Urticaria    Showed up on Saturday, pt took benadryl Saturday & Sunday, did not help.   HPI: [name] is a 60 y.o. female with hx of CKD, iron def anemia,and depression here today complaining of pruritus "everywhere"and erythematous skin lesions as described above. She has had urticaria in the past, work up showed positive ANA's. She has been dx'ed with lupus, follows with rheumatologist and currently on Plaquenil. Problem started 3 days ago, intermittent.  Rash on arms and right LE, areas with no rash are also pruritic.  Negative for new medications,detergents/soap/body product. No known insect bites or outdoor exposure. No sick contact or recent travel.  She has not identified exacerbating. Benadryl 25 mg helped. Negative for fever,chills,night sweats,sore throat,oral edema/lesions,cough,wheezing,or SOB. She has not noted abdominal pain,changes in bowel habits,N/V,or jaundice. She has had abnormal LFT's.  Vit D deficiency: She is on Ergocalciferol 50,000 U q 2 weeks. Last 25 OH vit D a year ago was 44.2.  Review of Systems  Constitutional:  Positive for fatigue. Negative for appetite change.  HENT:  Negative for trouble swallowing and voice change.   Eyes:  Negative for discharge and redness.  Cardiovascular:  Negative for chest pain, palpitations and leg swelling.  Musculoskeletal:  Negative for gait problem and myalgias.  Skin:  Positive for rash.  Allergic/Immunologic: Negative for environmental allergies and food allergies.  Neurological:  Negative for syncope, weakness and headaches.  Hematological:  Negative for adenopathy. Does not bruise/bleed easily.  Rest see pertinent positives and negatives per HPI.  Current Outpatient Medications on File Prior to Visit  Medication Sig Dispense Refill   amLODipine (NORVASC) 5 MG tablet Take 0.5 tablets (2.5 mg total) by mouth daily. 45 tablet 2   buPROPion (WELLBUTRIN XL) 150  MG 24 hr tablet Take 150 mg by mouth daily.     escitalopram (LEXAPRO) 20 MG tablet Take 20 mg by mouth daily.     FOLIC ACID PO Take by mouth daily.     hydroquinone 4 % cream Apply topically at bedtime.     hydroxychloroquine (PLAQUENIL) 200 MG tablet Take 1 tablet by mouth in the morning and at bedtime.     levothyroxine (SYNTHROID) 125 MCG tablet TAKE 1 TABLET BY MOUTH EVERY DAY 90 tablet 1   omeprazole (PRILOSEC) 20 MG capsule TAKE 1 CAPSULE BY MOUTH EVERY DAY 30 capsule 5   risperiDONE (RISPERDAL) 1 MG tablet Take 1 mg by mouth at bedtime.  1   Vitamin D, Ergocalciferol, (DRISDOL) 1.25 MG (50000 UNIT) CAPS capsule TAKE 1 CAPSULE BY MOUTH EVERY 14 (FOURTEEN) DAYS. FOR 8 WEEKS. THEN EVERY 2 WEEKS. 2 capsule 0   epoetin alfa-epbx (RETACRIT) 29937 UNIT/ML injection Inject 1 mL (40,000 Units total) into the skin every 30 (thirty) days. Self administer subcutaneously every 60 days after receiving lab work. (Patient not taking: No sig reported) 1.8 mL 3   No current facility-administered medications on file prior to visit.   Past Medical History:  Diagnosis Date   Anemia    IDA-not taking meds at this time due to insurance   Barrett esophagus    Depression    hx of   DUB (dysfunctional uterine bleeding)    GERD (gastroesophageal reflux disease)    on meds   Graves disease    Hypertension    on meds   Hypothyroidism    on meds   Allergies  Allergen Reactions   Milk-Related Compounds  Other (See Comments)    Intolerance to milk products-causes gas    Social History   Socioeconomic History   Marital status: Married    Spouse name: Not on file   Number of children: 0   Years of education: Not on file   Highest education level: Not on file  Occupational History    Employer: [employer]  Tobacco Use   Smoking status: Former    Packs/day: 1.00    Years: 20.00    Pack years: 20.00    Types: Cigarettes    Start date: 03/13/1967    Quit date: 07/14/1987    Years since quitting: 33.1    Smokeless tobacco: Never   Tobacco comments:    quit 26 years ago  Vaping Use   Vaping Use: Never used  Substance and Sexual Activity   Alcohol use: No    Alcohol/week: 0.0 standard drinks   Drug use: No   Sexual activity: Not on file  Other Topics Concern   Not on file  Social History Narrative   Occupation: Freight forwarder   Regular exercise- yes   0 caffeine drinks    Social Determinants of Radio broadcast assistant Strain: Not on file  Food Insecurity: Not on file  Transportation Needs: Not on file  Physical Activity: Not on file  Stress: Not on file  Social Connections: Not on file   Vitals:   08/15/20 1057  BP: 128/80  Pulse: 65  Resp: 16  Temp: 97.8 F (36.6 C)  SpO2: 99%   Body mass index is 29.09 kg/m.  Physical Exam Vitals and nursing note reviewed.  Constitutional:      General: She is not in acute distress.    Appearance: She is well-developed.  HENT:     Head: Normocephalic and atraumatic.     Mouth/Throat:     Mouth: Mucous membranes are moist.     Pharynx: Oropharynx is clear.  Eyes:     General: No scleral icterus.    Conjunctiva/sclera: Conjunctivae normal.  Cardiovascular:     Rate and Rhythm: Normal rate and regular rhythm.  Pulmonary:     Effort: Pulmonary effort is normal. No respiratory distress.     Breath sounds: Normal breath sounds.  Lymphadenopathy:     Cervical: No cervical adenopathy.  Skin:    General: Skin is warm.     Findings: Rash present.          Comments: On back no rash.Scratching marks on upper back, bilateral. Above right knee mildly erythematous macular lesion 3-4 cm, and small erythematous raised lesion left forearm,1.5 cm.  Neurological:     Mental Status: She is alert and oriented to person, place, and time.  Psychiatric:        Mood and Affect: Mood is anxious.     Comments: Well groomed, good eye contact.   ASSESSMENT AND PLAN:  [name] was seen today for urticaria.  Diagnoses and all orders  for this visit: Orders Placed This Encounter  Procedures   Hepatic function panel   Lab Results  Component Value Date   ALT 12 08/15/2020   AST 20 08/15/2020   ALKPHOS 83 08/15/2020   BILITOT 0.5 08/15/2020   Generalized pruritus Some areas with no evidence of rash. Daily moisturizer:Vaseline,Eucerin,or Cetaphil. Hydroxyzine 25 mg bid prn, mainly at bedtime.  -     hydrOXYzine (ATARAX/VISTARIL) 25 MG tablet; Take 1 tablet (25 mg total) by mouth 2 (two) times daily as needed for itching. -  methylPREDNISolone acetate (DEPO-MEDROL) injection 40 mg  Urticaria Here in the office and after verbal consent,she received Depo Medrol 40 mg IM x 1, waiting in office for 15 min, well tolerated. Topical triamcinolone daily prn, some side effects discussed. Zyrtec 10 mg daily am and Hydroxyzine 25 mg at night prn.  -     triamcinolone cream (KENALOG) 0.1 %; Apply 1 application topically daily as needed. -     hydrOXYzine (ATARAX/VISTARIL) 25 MG tablet; Take 1 tablet (25 mg total) by mouth 2 (two) times daily as needed for itching. -     methylPREDNISolone acetate (DEPO-MEDROL) injection 40 mg  Vitamin D deficiency Continue same dose of vit D, treatment will be adjusted if needed.  Return if symptoms worsen or fail to improve, for Keep next appt..   Niva Murren G. [name], MD  [facility]. [place] office.

## 2020-08-16 ENCOUNTER — Other Ambulatory Visit: Payer: Self-pay | Admitting: Family Medicine

## 2020-08-17 ENCOUNTER — Encounter: Payer: Self-pay | Admitting: Family Medicine

## 2020-08-17 MED ORDER — VITAMIN D (ERGOCALCIFEROL) 1.25 MG (50000 UNIT) PO CAPS: ORAL_CAPSULE | ORAL | 2 refills | Status: DC

## 2020-09-07 ENCOUNTER — Ambulatory Visit
Admission: RE | Admit: 2020-09-07 | Discharge: 2020-09-07 | Disposition: A | Discharge status: Home / Self Care | Payer: Managed Care, Other (non HMO) | Source: Ambulatory Visit | Attending: Family Medicine | Admitting: Family Medicine

## 2020-09-07 ENCOUNTER — Other Ambulatory Visit: Payer: Self-pay

## 2020-09-07 DIAGNOSIS — Z1231 Encounter for screening mammogram for malignant neoplasm of breast: Secondary | ICD-10-CM

## 2020-11-07 NOTE — Progress Notes (Signed)
Patient Care Team: Karen [name], Karen G, MD as PCP - General (Family Medicine)  DIAGNOSIS:    ICD-10-CM   1. Anemia of renal disease  N18.9    D63.1       CHIEF COMPLIANT: Follow-up of anemia  INTERVAL HISTORY: Karen [name] is a 61 y.o. with above-mentioned history of anemia on treatment with Retacrit and Plaquenil. She presents to the clinic today for follow-up.  She does not have any symptoms of anemia.  She in fact feels pretty normal as she always has been.  Her last injection with Retacrit was about a year ago.  She has not noticed any blood loss or any other problems of bleeding.  ALLERGIES:  is allergic to milk-related compounds.  MEDICATIONS:  Current Outpatient Medications  Medication Sig Dispense Refill   amLODipine (NORVASC) 5 MG tablet Take 0.5 tablets (2.5 mg total) by mouth daily. 45 tablet 2   buPROPion (WELLBUTRIN XL) 150 MG 24 hr tablet Take 150 mg by mouth daily.     epoetin alfa-epbx (RETACRIT) 35573 UNIT/ML injection Inject 1 mL (40,000 Units total) into the skin every 30 (thirty) days. Self administer subcutaneously every 60 days after receiving lab work. (Patient not taking: No sig reported) 1.8 mL 3   escitalopram (LEXAPRO) 20 MG tablet Take 20 mg by mouth daily.     FOLIC ACID PO Take by mouth daily.     hydroquinone 4 % cream Apply topically at bedtime.     hydroxychloroquine (PLAQUENIL) 200 MG tablet Take 1 tablet by mouth in the morning and at bedtime.     hydrOXYzine (ATARAX/VISTARIL) 25 MG tablet Take 1 tablet (25 mg total) by mouth 2 (two) times daily as needed for itching. 30 tablet 1   levothyroxine (SYNTHROID) 125 MCG tablet TAKE 1 TABLET BY MOUTH EVERY DAY 90 tablet 1   omeprazole (PRILOSEC) 20 MG capsule TAKE 1 CAPSULE BY MOUTH EVERY DAY 30 capsule 5   risperiDONE (RISPERDAL) 1 MG tablet Take 1 mg by mouth at bedtime.  1   triamcinolone cream (KENALOG) 0.1 % Apply 1 application topically daily as needed. 45 [name] 1   Vitamin D, Ergocalciferol, (DRISDOL)  1.25 MG (50000 UNIT) CAPS capsule TAKE 1 CAPSULE BY MOUTH EVERY 14 (FOURTEEN) DAYS. FOR 8 WEEKS. THEN EVERY 2 WEEKS. 6 capsule 2   No current facility-administered medications for this visit.    PHYSICAL EXAMINATION: ECOG PERFORMANCE STATUS: 1 - Symptomatic but completely ambulatory  Vitals:   11/08/20 1044  BP: (!) 141/74  Pulse: 64  Resp: 18  Temp: (!) 97.4 F (36.3 C)  SpO2: 100%   Filed Weights   11/08/20 1044  Weight: 189 lb 12.8 oz (86.1 kg)    LABORATORY DATA:  I have reviewed the data as listed CMP Latest Ref Rng & Units 08/15/2020 05/25/2020 09/02/2019  Glucose 65 - 99 mg/dL - - 68  BUN 4 - 21 - 11 9  Creatinine 0.5 - 1.1 - 1.2(A) 1.34(H)  Sodium 137 - 147 - 141 138  Potassium 3.4 - 5.3 - 3.9 4.3  Chloride 99 - 108 - 105 98  CO2 13 - 22 - 21 19(L)  Calcium 8.7 - 10.7 - 9.4 9.7  Total Protein 6.0 - 8.3 [name]/dL 7.2 - 7.7  Total Bilirubin 0.2 - 1.2 mg/dL 0.5 - 0.6  Alkaline Phos 39 - 117 U/L 83 93 87  AST 0 - 37 U/L 20 23 105(H)  ALT 0 - 35 U/L 12 11 48(H)  Lab Results  Component Value Date   WBC 2.7 (L) 11/08/2020   HGB 9.2 (L) 11/08/2020   HCT 29.3 (L) 11/08/2020   MCV 92.4 11/08/2020   PLT 198 11/08/2020   NEUTROABS 1.5 (L) 11/08/2020    ASSESSMENT & PLAN:  Anemia of renal disease Previous iron infusion: June 2018 ESA therapy: Previously received Aranesp 2-3 times per year from 2014 (with Dr. Marin Olp) Current treatment: Retacrit 20,000 units monthly started 10/02/2018 (2 doses received so far) goal: Hemoglobin of 10 or above   Bone marrow biopsy performed because of anemia and leukopenia: Normocellular bone marrow with trilineage hematopoiesis with mild megakaryocytic atypia   Lab review:   07/21/2019: Hemoglobin 9.2, WBC was 5.2 and platelet count 203 09/01/2019: Hb 9, WBC 2.2 ANC 1.2 12/31/2019: Hemoglobin 9.1 (Retacrit was not given because of insurance issues) 02/17/2020: Hemoglobin 9.3 05/17/20: Hemoglobin 10.1, ANC 1.3 11/08/2020: Hemoglobin  9.2  I discussed with her that the hemoglobin is below 10 [name] but she is completely asymptomatic.  Therefore would like to hold off on restarting her Retacrit injections and see her back in 3 months with blood work and follow-up.        No orders of the defined types were placed in this encounter.  The patient has a good understanding of the overall plan. she agrees with it. she will call with any problems that may develop before the next visit here.  Total time spent: 20 mins including face to face time and time spent for planning, charting and coordination of care  Rulon Eisenmenger, MD, MPH 11/08/2020  I, Thana Ates, am acting as scribe for Dr. Nicholas Lose.  I have reviewed the above documentation for accuracy and completeness, and I agree with the above.

## 2020-11-08 ENCOUNTER — Ambulatory Visit: Payer: Managed Care, Other (non HMO) | Admitting: Hematology and Oncology

## 2020-11-08 ENCOUNTER — Inpatient Hospital Stay (HOSPITAL_BASED_OUTPATIENT_CLINIC_OR_DEPARTMENT_OTHER): Payer: Managed Care, Other (non HMO) | Admitting: Hematology and Oncology

## 2020-11-08 ENCOUNTER — Other Ambulatory Visit: Payer: Self-pay

## 2020-11-08 ENCOUNTER — Other Ambulatory Visit: Payer: Managed Care, Other (non HMO)

## 2020-11-08 ENCOUNTER — Inpatient Hospital Stay: Payer: Managed Care, Other (non HMO) | Attending: Hematology and Oncology

## 2020-11-08 DIAGNOSIS — N189 Chronic kidney disease, unspecified: Secondary | ICD-10-CM | POA: Insufficient documentation

## 2020-11-08 DIAGNOSIS — D631 Anemia in chronic kidney disease: Secondary | ICD-10-CM | POA: Insufficient documentation

## 2020-11-08 DIAGNOSIS — D72819 Decreased white blood cell count, unspecified: Secondary | ICD-10-CM | POA: Diagnosis not present

## 2020-11-08 DIAGNOSIS — Z79899 Other long term (current) drug therapy: Secondary | ICD-10-CM | POA: Insufficient documentation

## 2020-11-08 LAB — CMP (CANCER CENTER ONLY)
ALT: 10 U/L (ref 0–44)
AST: 19 U/L (ref 15–41)
Albumin: 3.8 g/dL (ref 3.5–5.0)
Alkaline Phosphatase: 77 U/L (ref 38–126)
Anion gap: 9 (ref 5–15)
BUN: 10 mg/dL (ref 8–23)
CO2: 26 mmol/L (ref 22–32)
Calcium: 9.3 mg/dL (ref 8.9–10.3)
Chloride: 109 mmol/L (ref 98–111)
Creatinine: 1.03 mg/dL — ABNORMAL HIGH (ref 0.44–1.00)
GFR, Estimated: >60 mL/min (ref >60)
Glucose, Bld: 96 mg/dL (ref 70–99)
Potassium: 3.9 mmol/L (ref 3.5–5.1)
Sodium: 144 mmol/L (ref 135–145)
Total Bilirubin: 0.6 mg/dL (ref 0.3–1.2)
Total Protein: 7.1 g/dL (ref 6.5–8.1)

## 2020-11-08 LAB — CBC WITH DIFFERENTIAL (CANCER CENTER ONLY)
Abs Immature Granulocytes: 0 10*3/uL (ref 0.00–0.07)
Basophils Absolute: 0 10*3/uL (ref 0.0–0.1)
Basophils Relative: 0 %
Eosinophils Absolute: 0.1 10*3/uL (ref 0.0–0.5)
Eosinophils Relative: 2 %
HCT: 29.3 % — ABNORMAL LOW (ref 36.0–46.0)
Hemoglobin: 9.2 g/dL — ABNORMAL LOW (ref 12.0–15.0)
Immature Granulocytes: 0 %
Lymphocytes Relative: 30 %
Lymphs Abs: 0.8 10*3/uL (ref 0.7–4.0)
MCH: 29 pg (ref 26.0–34.0)
MCHC: 31.4 g/dL (ref 30.0–36.0)
MCV: 92.4 fL (ref 80.0–100.0)
Monocytes Absolute: 0.3 10*3/uL (ref 0.1–1.0)
Monocytes Relative: 12 %
Neutro Abs: 1.5 10*3/uL — ABNORMAL LOW (ref 1.7–7.7)
Neutrophils Relative %: 56 %
Platelet Count: 198 10*3/uL (ref 150–400)
RBC: 3.17 MIL/uL — ABNORMAL LOW (ref 3.87–5.11)
RDW: 13.1 % (ref 11.5–15.5)
WBC Count: 2.7 10*3/uL — ABNORMAL LOW (ref 4.0–10.5)
nRBC: 0 % (ref 0.0–0.2)

## 2020-11-08 LAB — IRON AND TIBC
Iron: 57 ug/dL (ref 41–142)
Saturation Ratios: 22 % (ref 21–57)
TIBC: 263 ug/dL (ref 236–444)
UIBC: 206 ug/dL (ref 120–384)

## 2020-11-08 LAB — FERRITIN: Ferritin: 188 ng/mL (ref 11–307)

## 2020-11-08 NOTE — Assessment & Plan Note (Signed)
Previous iron infusion: June 2018 ESA therapy: Previously received Aranesp 2-3 times per year from 2014(with Dr. Marin Olp) Current treatment: Retacrit 20,000 units monthly started 10/02/2018(2 doses received so far) goal: Hemoglobin of 10 or above  Bone marrow biopsy performed because of anemia and leukopenia: Normocellular bone marrow with trilineage hematopoiesis with mild megakaryocytic atypia  Lab review: 07/21/2019: Hemoglobin 9.2,WBC was 5.2 and platelet count 203 09/01/2019:Hb 9, WBC 2.2 ANC 1.2 12/31/2019: Hemoglobin 9.1 (Retacrit was not given because of insurance issues) 02/17/2020:Hemoglobin 9.3 05/17/20: Hemoglobin 10.1, ANC 1.3  I discussed with her she does not need Aranesp injections. Return to clinic in 6 months with labs

## 2020-11-16 ENCOUNTER — Other Ambulatory Visit: Payer: Managed Care, Other (non HMO)

## 2020-11-16 ENCOUNTER — Ambulatory Visit: Payer: Managed Care, Other (non HMO) | Admitting: Hematology and Oncology

## 2020-11-19 ENCOUNTER — Other Ambulatory Visit: Payer: Self-pay | Admitting: Family Medicine

## 2020-12-26 NOTE — Progress Notes (Signed)
HPI: [name] is a 60 y.o. female, who is here today for her routine physical.  Last CPE: 11/22/19  Regular exercise 3 or more time per week: Not regularly but active at work. Following a healthy diet: She is not eating ice cream as frequent as she was. She lives with her husband.   Chronic medical problems: Bipolar disorder,HTN,CKD III,anemia of chronic disease,and hypothyroidism among some.  She had her eye exam yesterday, 3 months follow up to check for glaucoma. She is on Prednisone taper, dx;ed with erythema nodosum. LE lesions are improving.  Pap smear: 11/24/17 negative for malignancy and HPV was not detected. Hx of abnormal pap smears: Negative  Immunization History  Administered Date(s) Administered   Influenza Split 11/06/2010, 12/18/2011   Influenza Whole 10/22/2007, 10/28/2008, 11/02/2009   Influenza,inj,Quad PF,6+ Mos 11/25/2012, 12/15/2013, 10/11/2015, 11/22/2016, 11/24/2017, 11/24/2018, 11/22/2019, 12/27/2020   PFIZER(Purple Top)SARS-COV-2 Vaccination 04/03/2019, 04/24/2019, 11/26/2019, 05/27/2020   PPD Test 08/22/2014   Pfizer Covid-19 Vaccine Bivalent Booster 70yr & up 10/28/2020   Pneumococcal Polysaccharide-23 11/22/2016   Td 02/04/1998, 10/28/2008   Tdap 11/24/2018   Zoster Recombinat (Shingrix) 11/24/2017, 01/23/2018, 11/22/2019   Health Maintenance  Topic Date Due   MAMMOGRAM  09/08/2022   PAP SMEAR-Modifier  11/25/2022   COLONOSCOPY (Pts 45-430yrInsurance coverage will need to be confirmed)  03/11/2023   TETANUS/TDAP  11/23/2028   INFLUENZA VACCINE  Completed   COVID-19 Vaccine  Completed   Hepatitis C Screening  Completed   HIV Screening  Completed   Zoster Vaccines- Shingrix  Completed   HPV VACCINES  Aged Out   Pneumococcal Vaccine [id number] old  Discontinued   She has no concerns today.  Hypertension:  Medications:amlodipine 5 mg 1/2 tablet daily. Side effects:none  Negative for unusual or severe headache, visual changes,  exertional chest pain, dyspnea,  focal weakness, or edema.  Lab Results  Component Value Date   CREATININE 1.03 (H) 11/08/2020   BUN 10 11/08/2020   NA 144 11/08/2020   K 3.9 11/08/2020   CL 109 11/08/2020   CO2 26 11/08/2020   Hypothyroidism: Currently she is on levothyroxine 125 mcg daily.  Lab Results  Component Value Date   TSH 0.43 08/16/2019   Lab Results  Component Value Date   HGBA1C 4.6 11/22/2019   Lab Results  Component Value Date   CHOL 152 11/22/2019   HDL 47 (L) 11/22/2019   LDLCALC 88 11/22/2019   LDLDIRECT 148.9 10/25/2010   TRIG 80 11/22/2019   CHOLHDL 3.2 11/22/2019   Leg cramps today x 2 early today. She has had problem before, she wonders if her K+ is low. Negative for associated edema,erythema,or LE pain.  Her psychiatrist is requesting blood work done that includes Vit D. Vit D def, she is on Ergocalciferol 50,000 U q 2 weeks. 25 OH vit D was 10 about 4 years ago.  Review of Systems  Constitutional:  Negative for appetite change, fatigue and fever.  HENT:  Negative for hearing loss, mouth sores and sore throat.   Eyes:  Negative for redness and visual disturbance.  Respiratory:  Negative for cough, shortness of breath and wheezing.   Cardiovascular:  Negative for chest pain and leg swelling.  Gastrointestinal:  Negative for abdominal pain, nausea and vomiting.       No changes in bowel habits.  Endocrine: Negative for cold intolerance, heat intolerance, polydipsia, polyphagia and polyuria.  Genitourinary:  Negative for decreased urine volume, dysuria, hematuria, vaginal bleeding and vaginal discharge.  Musculoskeletal:  Negative for gait problem and joint swelling.  Skin:  Negative for color change and rash.  Allergic/Immunologic: Negative for environmental allergies.  Neurological:  Negative for syncope, weakness and headaches.  Hematological:  Negative for adenopathy. Does not bruise/bleed easily.  Psychiatric/Behavioral:  Negative for  confusion and sleep disturbance.   All other systems reviewed and are negative.  Current Outpatient Medications on File Prior to Visit  Medication Sig Dispense Refill   amLODipine (NORVASC) 5 MG tablet Take 0.5 tablets (2.5 mg total) by mouth daily. 45 tablet 2   buPROPion (WELLBUTRIN XL) 150 MG 24 hr tablet Take 150 mg by mouth daily.     epoetin alfa-epbx (RETACRIT) 85885 UNIT/ML injection Inject 1 mL (40,000 Units total) into the skin every 30 (thirty) days. Self administer subcutaneously every 60 days after receiving lab work. 1.8 mL 3   escitalopram (LEXAPRO) 20 MG tablet Take 20 mg by mouth daily.     FOLIC ACID PO Take by mouth daily.     hydroquinone 4 % cream Apply topically at bedtime.     hydroxychloroquine (PLAQUENIL) 200 MG tablet Take 1 tablet by mouth in the morning and at bedtime.     levothyroxine (SYNTHROID) 125 MCG tablet TAKE 1 TABLET BY MOUTH EVERY DAY 90 tablet 1   omeprazole (PRILOSEC) 20 MG capsule TAKE 1 CAPSULE BY MOUTH EVERY DAY 30 capsule 5   risperiDONE (RISPERDAL) 1 MG tablet Take 1 mg by mouth at bedtime.  1   Vitamin D, Ergocalciferol, (DRISDOL) 1.25 MG (50000 UNIT) CAPS capsule TAKE 1 CAPSULE BY MOUTH EVERY 14 (FOURTEEN) DAYS. FOR 8 WEEKS. THEN EVERY 2 WEEKS. 6 capsule 2   hydrOXYzine (ATARAX/VISTARIL) 25 MG tablet Take 1 tablet (25 mg total) by mouth 2 (two) times daily as needed for itching. (Patient not taking: Reported on 12/27/2020) 30 tablet 1   triamcinolone cream (KENALOG) 0.1 % Apply 1 application topically daily as needed. (Patient not taking: Reported on 12/27/2020) 45 g 1   No current facility-administered medications on file prior to visit.   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 Cornett, MD;  Location: [facility];  Service: General;  Laterality: N/A;   COLONOSCOPY  2011   MS-F/V-moveiprep(food)-polypoid   UPPER GASTROINTESTINAL ENDOSCOPY  2019   MS-MAC-recall 3 yrs    Allergies  Allergen Reactions   Milk-Related Compounds Other (See Comments)    Intolerance to milk products-causes gas    Family History  Problem Relation Age of Onset   Hypertension Mother    Asthma Mother    Cancer Father 69       larynx cancer    Breast cancer Maternal Aunt 52   Colon cancer Neg Hx    Colon polyps Neg Hx    Esophageal cancer Neg Hx    Rectal cancer Neg Hx    Stomach cancer Neg Hx     Social History   Socioeconomic History  Marital status: Married    Spouse name: Not on file   Number of children: 0   Years of education: Not on file   Highest education level: Not on file  Occupational History    Employer: [employer]  Tobacco Use   Smoking status: Former    Packs/day: 1.00    Years: 20.00    Pack years: 20.00    Types: Cigarettes    Start date: 03/13/1967    Quit date: 07/14/1987    Years since quitting: 33.4   Smokeless tobacco: Never   Tobacco comments:    quit 26 years ago  Vaping Use   Vaping Use: Never used  Substance and Sexual Activity   Alcohol use: No    Alcohol/week: 0.0 standard drinks   Drug use: No   Sexual activity: Not on file  Other Topics Concern   Not on file  Social History Narrative   Occupation: Freight forwarder   Regular exercise- yes   0 caffeine drinks    Social Determinants of Radio broadcast assistant Strain: Not on file  Food Insecurity: Not on file  Transportation Needs: Not on file  Physical Activity: Not on file  Stress: Not on file  Social Connections: Not on file   Vitals:   12/27/20 0913  BP: 128/78   Pulse: 64  Resp: 16  Temp: 98.2 F (36.8 C)  SpO2: 99%   Body mass index is 27.64 kg/m.  Wt Readings from Last 3 Encounters:  12/27/20 187 lb 3.2 oz (84.9 kg)  11/08/20 189 lb 12.8 oz (86.1 kg)  08/15/20 197 lb (89.4 kg)   Physical Exam Vitals and nursing note reviewed.  Constitutional:      General: She is not in acute distress.    Appearance: She is well-developed.  HENT:     Head: Normocephalic and atraumatic.     Right Ear: Tympanic membrane, ear canal and external ear normal.     Left Ear: Tympanic membrane, ear canal and external ear normal.     Mouth/Throat:     Mouth: Mucous membranes are moist.     Pharynx: Oropharynx is clear. Uvula midline.  Eyes:     Extraocular Movements: Extraocular movements intact.     Conjunctiva/sclera: Conjunctivae normal.     Pupils: Pupils are equal, round, and reactive to light.  Neck:     Thyroid: No thyromegaly.  Cardiovascular:     Rate and Rhythm: Normal rate and regular rhythm.     Pulses:          Dorsalis pedis pulses are 2+ on the right side and 2+ on the left side.     Heart sounds: No murmur heard. Pulmonary:     Effort: Pulmonary effort is normal. No respiratory distress.     Breath sounds: Normal breath sounds.  Abdominal:     Palpations: Abdomen is soft. There is no hepatomegaly or mass.     Tenderness: There is no abdominal tenderness.  Genitourinary:    Comments: Deferred to gyn. Musculoskeletal:     Comments: No signs of synovitis appreciated. Nodular lesions on LE's, once in each one, above medial ankle.  Lymphadenopathy:     Cervical: No cervical adenopathy.     Upper Body:     Right upper body: No supraclavicular adenopathy.     Left upper body: No supraclavicular adenopathy.  Skin:    General: Skin is warm.     Findings: No erythema or rash.  Neurological:  General: No focal deficit present.     Mental Status: She is alert and oriented to person, place, and time.     Cranial Nerves: No cranial  nerve deficit.     Coordination: Coordination normal.     Gait: Gait normal.     Deep Tendon Reflexes:     Reflex Scores:      Bicep reflexes are 2+ on the right side and 2+ on the left side.      Patellar reflexes are 2+ on the right side and 2+ on the left side. Psychiatric:        Speech: Speech normal.     Comments: Well groomed, good eye contact.   ASSESSMENT AND PLAN:  Karen [name] was here today annual physical examination.  Orders Placed This Encounter  Procedures   Flu Vaccine QUAD 68moIM (Fluarix, Fluzone & Alfiuria Quad PF)   Lipid panel   TSH   Hemoglobin A1c   VITAMIN D 25 Hydroxy (Vit-D Deficiency, Fractures)   Basic metabolic panel   Magnesium   Lab Results  Component Value Date   CREATININE 1.23 (H) 12/27/2020   BUN 15 12/27/2020   NA 137 12/27/2020   K 4.2 12/27/2020   CL 102 12/27/2020   CO2 28 12/27/2020   Lab Results  Component Value Date   CHOL 205 (H) 12/27/2020   HDL 75.50 12/27/2020   LDLCALC 119 (H) 12/27/2020   LDLDIRECT 148.9 10/25/2010   TRIG 54.0 12/27/2020   CHOLHDL 3 12/27/2020   Lab Results  Component Value Date   TSH 0.02 (L) 12/27/2020   Lab Results  Component Value Date   HGBA1C 4.9 12/27/2020   Routine general medical examination at a health care facility We discussed the importance of regular physical activity and healthy diet for prevention of chronic illness and/or complications. Preventive guidelines reviewed. Vaccination up to date. Ca++ and vit D supplementation to continue. Next CPE in a year.  The 10-year ASCVD risk score (Arnett DK, et al., 2019) is: 6.3%   Values used to calculate the score:     Age: [age]years     Sex: Female     Is Non-Hispanic African American: Yes     Diabetic: No     Tobacco smoker: No     Systolic Blood Pressure: 1782mmHg     Is BP treated: Yes     HDL Cholesterol: 75.5 mg/dL     Total Cholesterol: 205 mg/dL  Screening for endocrine, metabolic and immunity disorder -      Hemoglobin A1c  Leg cramps We discussed possible etiologies. Adequate hydration. Further recommendations according to lab results.  Vitamin D deficiency, unspecified Continue Ergocalciferol 50,000 U q 2 weeks. Further recommendation according to 25 OH result.  Need for influenza vaccination -     Flu Vaccine QUAD 677moM (Fluarix, Fluzone & Alfiuria Quad PF)  Dyslipidemia (high LDL; low HDL) Non pharmacologic treatment recommended for now. Further recommendations will be given according to 10 years CVD risk score and lipid panel numbers.   Hypertension, essential, benign BP adequately controlled. No changes in current management.  Hypothyroidism Problem has been well controlled. No changes in Levothyroxine dose.  Return in 6 months (on 06/26/2021).  Dmiya Malphrus G. JoMartiniqueMD  [facility].

## 2020-12-27 ENCOUNTER — Encounter: Payer: Self-pay | Admitting: Family Medicine

## 2020-12-27 ENCOUNTER — Ambulatory Visit (INDEPENDENT_AMBULATORY_CARE_PROVIDER_SITE_OTHER): Payer: Managed Care, Other (non HMO) | Admitting: Family Medicine

## 2020-12-27 VITALS — BP 128/78 | HR 64 | Temp 98.2°F | Resp 16 | Ht 69.0 in | Wt 187.2 lb

## 2020-12-27 DIAGNOSIS — Z13220 Encounter for screening for lipoid disorders: Secondary | ICD-10-CM

## 2020-12-27 DIAGNOSIS — E785 Hyperlipidemia, unspecified: Secondary | ICD-10-CM

## 2020-12-27 DIAGNOSIS — Z23 Encounter for immunization: Secondary | ICD-10-CM | POA: Diagnosis not present

## 2020-12-27 DIAGNOSIS — I10 Essential (primary) hypertension: Secondary | ICD-10-CM | POA: Diagnosis not present

## 2020-12-27 DIAGNOSIS — Z13228 Encounter for screening for other metabolic disorders: Secondary | ICD-10-CM | POA: Diagnosis not present

## 2020-12-27 DIAGNOSIS — E538 Deficiency of other specified B group vitamins: Secondary | ICD-10-CM

## 2020-12-27 DIAGNOSIS — E039 Hypothyroidism, unspecified: Secondary | ICD-10-CM | POA: Diagnosis not present

## 2020-12-27 DIAGNOSIS — Z0000 Encounter for general adult medical examination without abnormal findings: Secondary | ICD-10-CM

## 2020-12-27 DIAGNOSIS — Z130 Encounter for screening for diseases of the blood and blood-forming organs and certain disorders involving the immune mechanism: Secondary | ICD-10-CM | POA: Diagnosis not present

## 2020-12-27 DIAGNOSIS — R252 Cramp and spasm: Secondary | ICD-10-CM | POA: Diagnosis not present

## 2020-12-27 DIAGNOSIS — E559 Vitamin D deficiency, unspecified: Secondary | ICD-10-CM

## 2020-12-27 DIAGNOSIS — Z1329 Encounter for screening for other suspected endocrine disorder: Secondary | ICD-10-CM

## 2020-12-27 LAB — BASIC METABOLIC PANEL
BUN: 15 mg/dL (ref 6–23)
CO2: 28 mEq/L (ref 19–32)
Calcium: 9.2 mg/dL (ref 8.4–10.5)
Chloride: 102 mEq/L (ref 96–112)
Creatinine, Ser: 1.23 mg/dL — ABNORMAL HIGH (ref 0.40–1.20)
GFR: 47.4 mL/min — ABNORMAL LOW (ref >60.00)
Glucose, Bld: 87 mg/dL (ref 70–99)
Potassium: 4.2 mEq/L (ref 3.5–5.1)
Sodium: 137 mEq/L (ref 135–145)

## 2020-12-27 LAB — LIPID PANEL
Cholesterol: 205 mg/dL — ABNORMAL HIGH (ref 0–200)
HDL: 75.5 mg/dL (ref >39.00)
LDL Cholesterol: 119 mg/dL — ABNORMAL HIGH (ref 0–99)
NonHDL: 129.51
Total CHOL/HDL Ratio: 3
Triglycerides: 54 mg/dL (ref 0.0–149.0)
VLDL: 10.8 mg/dL (ref 0.0–40.0)

## 2020-12-27 LAB — MAGNESIUM: Magnesium: 1.8 mg/dL (ref 1.5–2.5)

## 2020-12-27 LAB — VITAMIN D 25 HYDROXY (VIT D DEFICIENCY, FRACTURES): VITD: 39.82 ng/mL (ref 30.00–100.00)

## 2020-12-27 LAB — TSH: TSH: 0.02 u[IU]/mL — ABNORMAL LOW (ref 0.35–5.50)

## 2020-12-27 LAB — HEMOGLOBIN A1C: Hgb A1c MFr Bld: 4.9 % (ref 4.6–6.5)

## 2020-12-27 NOTE — Patient Instructions (Addendum)
A few things to remember from today's visit:  Routine general medical examination at a health care facility  Hypertension, essential, benign  Dyslipidemia (high LDL; low HDL) - Plan: Lipid panel  Screening for endocrine, metabolic and immunity disorder - Plan: Hemoglobin A1c  Hypothyroidism, unspecified type - Plan: TSH  B12 deficiency  Leg cramps - Plan: Basic metabolic panel, Magnesium  Vitamin D deficiency, unspecified - Plan: VITAMIN D 25 Hydroxy (Vit-D Deficiency, Fractures)  If you need refills please call your pharmacy. Do not use My Chart to request refills or for acute issues that need immediate attention.   Please be sure medication list is accurate. If a new problem present, please set up appointment sooner than planned today.  Health Maintenance, Female Adopting a healthy lifestyle and getting preventive care are important in promoting health and wellness. Ask your health care provider about: The right schedule for you to have regular tests and exams. Things you can do on your own to prevent diseases and keep yourself healthy. What should I know about diet, weight, and exercise? Eat a healthy diet  Eat a diet that includes plenty of vegetables, fruits, low-fat dairy products, and lean protein. Do not eat a lot of foods that are high in solid fats, added sugars, or sodium. Maintain a healthy weight Body mass index (BMI) is used to identify weight problems. It estimates body fat based on height and weight. Your health care provider can help determine your BMI and help you achieve or maintain a healthy weight. Get regular exercise Get regular exercise. This is one of the most important things you can do for your health. Most adults should: Exercise for at least 150 minutes each week. The exercise should increase your heart rate and make you sweat (moderate-intensity exercise). Do strengthening exercises at least twice a week. This is in addition to the  moderate-intensity exercise. Spend less time sitting. Even light physical activity can be beneficial. Watch cholesterol and blood lipids Have your blood tested for lipids and cholesterol at 61 years of age, then have this test every 5 years. Have your cholesterol levels checked more often if: Your lipid or cholesterol levels are high. You are older than 61 years of age. You are at high risk for heart disease. What should I know about cancer screening? Depending on your health history and family history, you may need to have cancer screening at various ages. This may include screening for: Breast cancer. Cervical cancer. Colorectal cancer. Skin cancer. Lung cancer. What should I know about heart disease, diabetes, and high blood pressure? Blood pressure and heart disease High blood pressure causes heart disease and increases the risk of stroke. This is more likely to develop in people who have high blood pressure readings or are overweight. Have your blood pressure checked: Every 3-5 years if you are 46-47 years of age. Every year if you are 13 years old or older. Diabetes Have regular diabetes screenings. This checks your fasting blood sugar level. Have the screening done: Once every three years after age 105 if you are at a normal weight and have a low risk for diabetes. More often and at a younger age if you are overweight or have a high risk for diabetes. What should I know about preventing infection? Hepatitis B If you have a higher risk for hepatitis B, you should be screened for this virus. Talk with your health care provider to find out if you are at risk for hepatitis B infection. Hepatitis C  Testing is recommended for: Everyone born from [birth year] through 1966. Anyone with known risk factors for hepatitis C. Sexually transmitted infections (STIs) Get screened for STIs, including gonorrhea and chlamydia, if: You are sexually active and are younger than 61 years of age. You are  older than 61 years of age and your health care provider tells you that you are at risk for this type of infection. Your sexual activity has changed since you were last screened, and you are at increased risk for chlamydia or gonorrhea. Ask your health care provider if you are at risk. Ask your health care provider about whether you are at high risk for HIV. Your health care provider may recommend a prescription medicine to help prevent HIV infection. If you choose to take medicine to prevent HIV, you should first get tested for HIV. You should then be tested every 3 months for as long as you are taking the medicine. Pregnancy If you are about to stop having your period (premenopausal) and you may become pregnant, seek counseling before you get pregnant. Take 400 to 800 micrograms (mcg) of folic acid every day if you become pregnant. Ask for birth control (contraception) if you want to prevent pregnancy. Osteoporosis and menopause Osteoporosis is a disease in which the bones lose minerals and strength with aging. This can result in bone fractures. If you are 70 years old or older, or if you are at risk for osteoporosis and fractures, ask your health care provider if you should: Be screened for bone loss. Take a calcium or vitamin D supplement to lower your risk of fractures. Be given hormone replacement therapy (HRT) to treat symptoms of menopause. Follow these instructions at home: Alcohol use Do not drink alcohol if: Your health care provider tells you not to drink. You are pregnant, may be pregnant, or are planning to become pregnant. If you drink alcohol: Limit how much you have to: 0-1 drink a day. Know how much alcohol is in your drink. In the [place] one drink equals one 12 oz bottle of beer (355 mL), one 5 oz glass of wine (148 mL), or one 1 oz glass of hard liquor (44 mL). Lifestyle Do not use any products that contain nicotine or tobacco. These products include cigarettes, chewing  tobacco, and vaping devices, such as e-cigarettes. If you need help quitting, ask your health care provider. Do not use street drugs. Do not share needles. Ask your health care provider for help if you need support or information about quitting drugs. General instructions Schedule regular health, dental, and eye exams. Stay current with your vaccines. Tell your health care provider if: You often feel depressed. You have ever been abused or do not feel safe at home. Summary Adopting a healthy lifestyle and getting preventive care are important in promoting health and wellness. Follow your health care provider's instructions about healthy diet, exercising, and getting tested or screened for diseases. Follow your health care provider's instructions on monitoring your cholesterol and blood pressure. This information is not intended to replace advice given to you by your health care provider. Make sure you discuss any questions you have with your health care provider. Document Revised: 06/12/2020 Document Reviewed: 06/12/2020 Elsevier Patient Education  [place].

## 2020-12-27 NOTE — Assessment & Plan Note (Signed)
Problem has been well controlled. No changes in Levothyroxine dose. 

## 2020-12-27 NOTE — Assessment & Plan Note (Signed)
BP adequately controlled. No changes in current management. 

## 2020-12-27 NOTE — Assessment & Plan Note (Signed)
Non pharmacologic treatment recommended for now. Further recommendations will be given according to 10 years CVD risk score and lipid panel numbers. 

## 2021-01-15 ENCOUNTER — Other Ambulatory Visit: Payer: Self-pay | Admitting: Family Medicine

## 2021-02-14 ENCOUNTER — Telehealth: Payer: Self-pay

## 2021-02-14 NOTE — Telephone Encounter (Signed)
-----   Message from Rodrigo Ran, [place] sent at 01/05/2021 10:01 AM EST ----- TSH in 6-8 weeks.

## 2021-02-14 NOTE — Telephone Encounter (Signed)
Can you get her scheduled for a lab appointment? The order is already in, thank you!

## 2021-02-16 ENCOUNTER — Other Ambulatory Visit: Payer: Self-pay | Admitting: *Deleted

## 2021-02-16 DIAGNOSIS — D631 Anemia in chronic kidney disease: Secondary | ICD-10-CM

## 2021-02-19 ENCOUNTER — Inpatient Hospital Stay: Payer: Managed Care, Other (non HMO) | Attending: Hematology and Oncology

## 2021-02-19 ENCOUNTER — Other Ambulatory Visit (INDEPENDENT_AMBULATORY_CARE_PROVIDER_SITE_OTHER): Payer: Managed Care, Other (non HMO)

## 2021-02-19 ENCOUNTER — Other Ambulatory Visit: Payer: Self-pay

## 2021-02-19 DIAGNOSIS — N189 Chronic kidney disease, unspecified: Secondary | ICD-10-CM | POA: Insufficient documentation

## 2021-02-19 DIAGNOSIS — D631 Anemia in chronic kidney disease: Secondary | ICD-10-CM | POA: Diagnosis present

## 2021-02-19 DIAGNOSIS — E039 Hypothyroidism, unspecified: Secondary | ICD-10-CM | POA: Diagnosis not present

## 2021-02-19 LAB — FERRITIN: Ferritin: 224 ng/mL (ref 11–307)

## 2021-02-19 LAB — IRON AND IRON BINDING CAPACITY (CC-WL,HP ONLY)
Iron: 50 ug/dL (ref 28–170)
Saturation Ratios: 18 % (ref 10.4–31.8)
TIBC: 280 ug/dL (ref 250–450)
UIBC: 230 ug/dL (ref 148–442)

## 2021-02-19 LAB — CMP (CANCER CENTER ONLY)
ALT: 10 U/L (ref 0–44)
AST: 17 U/L (ref 15–41)
Albumin: 3.9 g/dL (ref 3.5–5.0)
Alkaline Phosphatase: 56 U/L (ref 38–126)
Anion gap: 7 (ref 5–15)
BUN: 16 mg/dL (ref 8–23)
CO2: 25 mmol/L (ref 22–32)
Calcium: 8.9 mg/dL (ref 8.9–10.3)
Chloride: 109 mmol/L (ref 98–111)
Creatinine: 1.24 mg/dL — ABNORMAL HIGH (ref 0.44–1.00)
GFR, Estimated: 50 mL/min — ABNORMAL LOW (ref >60)
Glucose, Bld: 94 mg/dL (ref 70–99)
Potassium: 4 mmol/L (ref 3.5–5.1)
Sodium: 141 mmol/L (ref 135–145)
Total Bilirubin: 0.4 mg/dL (ref 0.3–1.2)
Total Protein: 6.8 g/dL (ref 6.5–8.1)

## 2021-02-19 LAB — CBC WITH DIFFERENTIAL (CANCER CENTER ONLY)
Abs Immature Granulocytes: 0.01 10*3/uL (ref 0.00–0.07)
Basophils Absolute: 0 10*3/uL (ref 0.0–0.1)
Basophils Relative: 0 %
Eosinophils Absolute: 0.1 10*3/uL (ref 0.0–0.5)
Eosinophils Relative: 1 %
HCT: 32.2 % — ABNORMAL LOW (ref 36.0–46.0)
Hemoglobin: 10 g/dL — ABNORMAL LOW (ref 12.0–15.0)
Immature Granulocytes: 0 %
Lymphocytes Relative: 16 %
Lymphs Abs: 0.7 10*3/uL (ref 0.7–4.0)
MCH: 29.2 pg (ref 26.0–34.0)
MCHC: 31.1 g/dL (ref 30.0–36.0)
MCV: 93.9 fL (ref 80.0–100.0)
Monocytes Absolute: 0.6 10*3/uL (ref 0.1–1.0)
Monocytes Relative: 15 %
Neutro Abs: 2.8 10*3/uL (ref 1.7–7.7)
Neutrophils Relative %: 68 %
Platelet Count: 200 10*3/uL (ref 150–400)
RBC: 3.43 MIL/uL — ABNORMAL LOW (ref 3.87–5.11)
RDW: 13.3 % (ref 11.5–15.5)
WBC Count: 4.2 10*3/uL (ref 4.0–10.5)
nRBC: 0 % (ref 0.0–0.2)

## 2021-02-19 LAB — T4, FREE: Free T4: 0.93 ng/dL (ref 0.60–1.60)

## 2021-02-19 LAB — TSH: TSH: 0.04 u[IU]/mL — ABNORMAL LOW (ref 0.35–5.50)

## 2021-02-19 LAB — VITAMIN B12: Vitamin B-12: 337 pg/mL (ref 180–914)

## 2021-02-25 ENCOUNTER — Other Ambulatory Visit: Payer: Self-pay | Admitting: Family Medicine

## 2021-02-25 DIAGNOSIS — E039 Hypothyroidism, unspecified: Secondary | ICD-10-CM

## 2021-02-25 MED ORDER — LEVOTHYROXINE SODIUM 88 MCG PO TABS: 88.0000 ug | ORAL_TABLET | Freq: Every day | ORAL | 0 refills | Status: DC

## 2021-04-20 ENCOUNTER — Telehealth: Payer: Self-pay

## 2021-04-20 NOTE — Telephone Encounter (Signed)
-----   Message from Rodrigo Ran, [place] sent at 02/26/2021  3:09 PM EST ----- ?TSH in 8 weeks. ? ?

## 2021-04-20 NOTE — Telephone Encounter (Signed)
I left patient a message to call back to schedule a recheck on her TSH level.  ?

## 2021-04-20 NOTE — Telephone Encounter (Signed)
Pt sch for lab on 05-04-2021  ?

## 2021-05-03 ENCOUNTER — Other Ambulatory Visit: Payer: Self-pay | Admitting: Family Medicine

## 2021-05-04 ENCOUNTER — Other Ambulatory Visit (INDEPENDENT_AMBULATORY_CARE_PROVIDER_SITE_OTHER): Payer: Managed Care, Other (non HMO)

## 2021-05-04 ENCOUNTER — Other Ambulatory Visit: Payer: Managed Care, Other (non HMO)

## 2021-05-04 DIAGNOSIS — E039 Hypothyroidism, unspecified: Secondary | ICD-10-CM | POA: Diagnosis not present

## 2021-05-04 LAB — TSH: TSH: 0.72 u[IU]/mL (ref 0.35–5.50)

## 2021-05-20 ENCOUNTER — Other Ambulatory Visit: Payer: Self-pay | Admitting: Family Medicine

## 2021-05-20 DIAGNOSIS — E039 Hypothyroidism, unspecified: Secondary | ICD-10-CM

## 2021-06-18 ENCOUNTER — Other Ambulatory Visit: Payer: Self-pay | Admitting: Family Medicine

## 2021-06-18 DIAGNOSIS — I10 Essential (primary) hypertension: Secondary | ICD-10-CM

## 2021-07-04 ENCOUNTER — Telehealth: Payer: Self-pay | Admitting: Family Medicine

## 2021-07-04 NOTE — Telephone Encounter (Signed)
I spoke with patient. She is aware that lab results were normal & to continue the levothyroxine 88 mcg.

## 2021-07-04 NOTE — Telephone Encounter (Signed)
Pt is calling and her tsh results was release to mychart in march. Pt does not look at her mychart message and would like her blood work result

## 2021-08-09 ENCOUNTER — Other Ambulatory Visit: Payer: Self-pay | Admitting: Family Medicine

## 2021-08-09 DIAGNOSIS — Z1231 Encounter for screening mammogram for malignant neoplasm of breast: Secondary | ICD-10-CM

## 2021-08-10 ENCOUNTER — Other Ambulatory Visit: Payer: Self-pay | Admitting: Family Medicine

## 2021-08-10 DIAGNOSIS — E039 Hypothyroidism, unspecified: Secondary | ICD-10-CM

## 2021-08-24 NOTE — Progress Notes (Unsigned)
ACUTE VISIT No chief complaint on file.  HPI: [name] is a 61 y.o. female, who is here today complaining of *** HPI  Review of Systems Rest see pertinent positives and negatives per HPI.  Current Outpatient Medications on File Prior to Visit  Medication Sig Dispense Refill  . amLODipine (NORVASC) 5 MG tablet TAKE 1/2 TABLET BY MOUTH DAILY 45 tablet 2  . buPROPion (WELLBUTRIN XL) 150 MG 24 hr tablet Take 150 mg by mouth daily.    Marland Kitchen epoetin alfa-epbx (RETACRIT) 00938 UNIT/ML injection Inject 1 mL (40,000 Units total) into the skin every 30 (thirty) days. Self administer subcutaneously every 60 days after receiving lab work. 1.8 mL 3  . escitalopram (LEXAPRO) 20 MG tablet Take 20 mg by mouth daily.    Marland Kitchen FOLIC ACID PO Take by mouth daily.    . hydroquinone 4 % cream Apply topically at bedtime.    . hydroxychloroquine (PLAQUENIL) 200 MG tablet Take 1 tablet by mouth in the morning and at bedtime.    . hydrOXYzine (ATARAX/VISTARIL) 25 MG tablet Take 1 tablet (25 mg total) by mouth 2 (two) times daily as needed for itching. (Patient not taking: Reported on 12/27/2020) 30 tablet 1  . levothyroxine (SYNTHROID) 88 MCG tablet TAKE 1 TABLET BY MOUTH EVERY DAY. (STOP THE 125 MCG DOSE) 30 tablet 2  . omeprazole (PRILOSEC) 20 MG capsule TAKE 1 CAPSULE BY MOUTH EVERY DAY 30 capsule 5  . risperiDONE (RISPERDAL) 1 MG tablet Take 1 mg by mouth at bedtime.  1  . triamcinolone cream (KENALOG) 0.1 % Apply 1 application topically daily as needed. (Patient not taking: Reported on 12/27/2020) 45 g 1  . Vitamin D, Ergocalciferol, (DRISDOL) 1.25 MG (50000 UNIT) CAPS capsule TAKE 1 CAPSULE BY MOUTH EVERY 14 (FOURTEEN) DAYS. FOR 8 WEEKS. THEN EVERY 2 WEEKS. 6 capsule 2   No current facility-administered medications on file prior to visit.     Past Medical History:  Diagnosis Date  . 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 Barrett esophagus   . Depression    hx of  . DUB  (dysfunctional uterine bleeding)   . GERD (gastroesophageal reflux disease)    on meds  . Graves disease   . Hypertension    on meds  . Hypothyroidism    on meds   Allergies  Allergen Reactions  . Milk-Related Compounds Other (See Comments)    Intolerance to milk products-causes gas    Social History   Socioeconomic History  . Marital status: Married    Spouse name: Not on file  . Number of children: 0  . Years of education: Not on file  . Highest education level: Not on file  Occupational History    Employer: [employer]  Tobacco Use  . Smoking status: Former    Packs/day: 1.00    Years: 20.00    Total pack years: 20.00    Types: Cigarettes    Start date: 03/13/1967    Quit date: 07/14/1987    Years since quitting: 34.1  . Smokeless tobacco: Never  . Tobacco comments:    quit 26 years ago  Vaping Use  . Vaping Use: Never used  Substance and Sexual Activity  . Alcohol use: No    Alcohol/week: 0.0 standard drinks of alcohol  . Drug use: No  . Sexual activity: Not on file  Other Topics Concern  . Not on file  Social History Narrative  Occupation: Freight forwarder   Regular exercise- yes   0 caffeine drinks    Social Determinants of Radio broadcast assistant Strain: Not on file  Food Insecurity: Not on file  Transportation Needs: Not on file  Physical Activity: Not on file  Stress: Not on file  Social Connections: Not on file    There were no vitals filed for this visit. There is no height or weight on file to calculate BMI.  Physical Exam  ASSESSMENT AND PLAN:  There are no diagnoses linked to this encounter.   No follow-ups on file.   Amelio Brosky G. [name], MD  [facility]. [place] office.  Discharge Instructions   None

## 2021-08-27 ENCOUNTER — Ambulatory Visit (INDEPENDENT_AMBULATORY_CARE_PROVIDER_SITE_OTHER): Payer: Managed Care, Other (non HMO) | Admitting: Family Medicine

## 2021-08-27 ENCOUNTER — Encounter: Payer: Self-pay | Admitting: Family Medicine

## 2021-08-27 VITALS — BP 128/80 | HR 80 | Temp 98.6°F | Resp 12 | Ht 69.0 in | Wt 206.0 lb

## 2021-08-27 DIAGNOSIS — R0781 Pleurodynia: Secondary | ICD-10-CM | POA: Diagnosis not present

## 2021-08-27 DIAGNOSIS — D464 Refractory anemia, unspecified: Secondary | ICD-10-CM | POA: Diagnosis not present

## 2021-08-27 DIAGNOSIS — L810 Postinflammatory hyperpigmentation: Secondary | ICD-10-CM | POA: Diagnosis not present

## 2021-08-27 DIAGNOSIS — R229 Localized swelling, mass and lump, unspecified: Secondary | ICD-10-CM | POA: Diagnosis not present

## 2021-08-27 DIAGNOSIS — M359 Systemic involvement of connective tissue, unspecified: Secondary | ICD-10-CM

## 2021-08-27 NOTE — Patient Instructions (Addendum)
A few things to remember from today's visit:  Costal margin pain - Plan: DG Ribs Unilateral W/Chest Right  Erythematous skin nodule  Hyperpigmentation of skin, postinflammatory  If you need refills please call your pharmacy. Do not use My Chart to request refills or for acute issues that need immediate attention. Pending appt with rheumatologist.  Please be sure medication list is accurate. If a new problem present, please set up appointment sooner than planned today. Erythema Nodosum  Erythema nodosum is a skin condition in which patches of fat under the skin of the lower legs become inflamed. This causes painful bumps (nodules) to form. What are the causes? This condition may be caused by: Infections. Strep throat (pharyngitis) is a common cause. Certain medicines, especially birth control pills, penicillin, and sulfa medicines. Sarcoidosis. Pregnancy. Certain inflammatory conditions, such as Crohn's disease. Certain cancers. In some cases, the cause may not be known. What increases the risk? This condition is more likely to develop in young adult women. This may be related to birth control pills. What are the signs or symptoms? The main symptom of this condition is the development of nodules that: Look like raised bruises and are tender to the touch. Usually appear on the front of the lower legs (shins) and may also appear on the arms or the abdomen. Gradually change in color from pink to brown. Leave a dark mark that fades away after several months. Other symptoms besides nodules may include: Fever. Tiredness(fatigue). Joint pain. How is this diagnosed? This condition may be diagnosed based on: Your symptoms and your medical history. A physical exam. Tests, such as: Blood tests. X-rays. A skin sample may be removed (skin biopsy) to be examined by a specialist (pathologist). How is this treated? Treatment for this condition depends on the cause. The nodules usually  go away after the underlying cause is treated, such as after medicine is used to fight infection. Treatment may also include: NSAIDs, such as ibuprofen, for pain and inflammation. Potassium iodide supplements. Steroid medicines. Resting and raising (elevating) the affected legs. Follow these instructions at home: Medicines Take over-the-counter and prescription medicines only as told by your health care provider. If you were prescribed an antibiotic medicine, take or apply it as told by your health care provider. Do not stop using the antibiotic even if you start to feel better. Managing pain, stiffness, and swelling  If directed, put ice on the affected area. To do this: Put ice in a plastic bag. Place a towel between your skin and the bag. Leave the ice on for 20 minutes, 2-3 times a day. Remove the ice if your skin turns bright red. This is very important. If you cannot feel pain, heat, or cold, you have a greater risk of damage to the area. Elevate the affected area above the level of your heart while you are sitting or lying down. Wear a compression bandage as told by your health care provider. Activity Rest and return to your normal activities as told by your health care provider. Ask your health care provider what activities are safe for you. Avoid very intense (vigorous) exercise until your symptoms go away. General instructions Avoid scratching your skin. To relieve itchiness, make a paste with dry oatmeal and warm water, then put the paste on itchy areas. Let the paste dry, remove it, and then apply moisturizer. You may do this 2-3 times a day, or as needed. Keep all follow-up visits. This is important. Contact a health care provider if  you: Have symptoms that do not get better with treatment and home care. Have a fever that does not go away. Vomit more than one time. Have pain that gets worse. Have a sore throat. Summary Erythema nodosum is a skin condition that causes  painful bumps (nodules) to form. The bumps usually appear on the front of the lower legs (shins). Avoid very intense (vigorous) exercise until your symptoms go away. Contact a health care provider if you have a fever or if your symptoms do not improve. This information is not intended to replace advice given to you by your health care provider. Make sure you discuss any questions you have with your health care provider. Document Revised: 04/05/2020 Document Reviewed: 04/05/2020 Elsevier Patient Education  [place].

## 2021-08-29 ENCOUNTER — Ambulatory Visit (INDEPENDENT_AMBULATORY_CARE_PROVIDER_SITE_OTHER)
Admission: RE | Admit: 2021-08-29 | Discharge: 2021-08-29 | Disposition: A | Discharge status: Home / Self Care | Payer: Managed Care, Other (non HMO) | Source: Ambulatory Visit | Attending: Family Medicine | Admitting: Family Medicine

## 2021-08-29 DIAGNOSIS — R0781 Pleurodynia: Secondary | ICD-10-CM

## 2021-09-10 ENCOUNTER — Ambulatory Visit
Admission: RE | Admit: 2021-09-10 | Discharge: 2021-09-10 | Disposition: A | Discharge status: Home / Self Care | Payer: Managed Care, Other (non HMO) | Source: Ambulatory Visit | Attending: Family Medicine | Admitting: Family Medicine

## 2021-09-10 DIAGNOSIS — Z1231 Encounter for screening mammogram for malignant neoplasm of breast: Secondary | ICD-10-CM

## 2021-10-15 ENCOUNTER — Telehealth: Payer: Self-pay | Admitting: Hematology and Oncology

## 2021-10-15 NOTE — Telephone Encounter (Signed)
Scheduled per 9/11 in basket, message has been left  

## 2021-10-18 ENCOUNTER — Telehealth: Payer: Self-pay

## 2021-10-18 NOTE — Telephone Encounter (Signed)
/  Pt called and LVM stating she needs to be scheduled for an appt with MD as it has been 6 mos. She was contacted by one of our schedulers 9/11 advising she had been scheduled 9/18 at [place] for labs. Called pt back and LVM indicating she is scheduled 9/18.

## 2021-10-19 ENCOUNTER — Other Ambulatory Visit: Payer: Self-pay

## 2021-10-19 DIAGNOSIS — D500 Iron deficiency anemia secondary to blood loss (chronic): Secondary | ICD-10-CM

## 2021-10-22 ENCOUNTER — Inpatient Hospital Stay: Payer: Managed Care, Other (non HMO) | Admitting: Hematology and Oncology

## 2021-10-22 ENCOUNTER — Inpatient Hospital Stay: Payer: Managed Care, Other (non HMO) | Attending: Family Medicine

## 2021-10-22 NOTE — Assessment & Plan Note (Deleted)
Previous iron infusion: June 2018 ESA therapy: Previously received Aranesp 2-3 times per year from 2014(with Dr. Marin Olp) Current treatment: Retacrit 20,000 units monthly started 10/02/2018(2 doses received so far) goal: Hemoglobin of 10 or above  Bone marrow biopsy performed because of anemia and leukopenia: Normocellular bone marrow with trilineage hematopoiesis with mild megakaryocytic atypia  Lab review: 07/21/2019: Hemoglobin 9.2,WBC was 5.2 and platelet count 203 09/01/2019:Hb 9, WBC 2.2 ANC 1.2 12/31/2019: Hemoglobin 9.1 (Retacrit was not given because of insurance issues) 02/17/2020:Hemoglobin 9.3 05/17/20:Hemoglobin 10.1, ANC 1.3 11/08/2020: Hemoglobin 9.2 10/22/2021:

## 2021-11-06 ENCOUNTER — Ambulatory Visit: Payer: Managed Care, Other (non HMO) | Admitting: Hematology and Oncology

## 2021-11-06 ENCOUNTER — Other Ambulatory Visit: Payer: Managed Care, Other (non HMO)

## 2021-11-06 ENCOUNTER — Other Ambulatory Visit: Payer: Self-pay | Admitting: *Deleted

## 2021-11-06 DIAGNOSIS — D500 Iron deficiency anemia secondary to blood loss (chronic): Secondary | ICD-10-CM

## 2021-11-07 ENCOUNTER — Inpatient Hospital Stay: Payer: Managed Care, Other (non HMO) | Attending: Hematology and Oncology

## 2021-11-07 DIAGNOSIS — Z79899 Other long term (current) drug therapy: Secondary | ICD-10-CM | POA: Diagnosis not present

## 2021-11-07 DIAGNOSIS — D72819 Decreased white blood cell count, unspecified: Secondary | ICD-10-CM | POA: Insufficient documentation

## 2021-11-07 DIAGNOSIS — D631 Anemia in chronic kidney disease: Secondary | ICD-10-CM | POA: Insufficient documentation

## 2021-11-07 DIAGNOSIS — N189 Chronic kidney disease, unspecified: Secondary | ICD-10-CM | POA: Diagnosis present

## 2021-11-07 DIAGNOSIS — D500 Iron deficiency anemia secondary to blood loss (chronic): Secondary | ICD-10-CM

## 2021-11-07 LAB — CBC WITH DIFFERENTIAL (CANCER CENTER ONLY)
Abs Immature Granulocytes: 0 10*3/uL (ref 0.00–0.07)
Basophils Absolute: 0 10*3/uL (ref 0.0–0.1)
Basophils Relative: 1 %
Eosinophils Absolute: 0 10*3/uL (ref 0.0–0.5)
Eosinophils Relative: 0 %
HCT: 30 % — ABNORMAL LOW (ref 36.0–46.0)
Hemoglobin: 9.7 g/dL — ABNORMAL LOW (ref 12.0–15.0)
Immature Granulocytes: 0 %
Lymphocytes Relative: 16 %
Lymphs Abs: 0.7 10*3/uL (ref 0.7–4.0)
MCH: 30.2 pg (ref 26.0–34.0)
MCHC: 32.3 g/dL (ref 30.0–36.0)
MCV: 93.5 fL (ref 80.0–100.0)
Monocytes Absolute: 0.4 10*3/uL (ref 0.1–1.0)
Monocytes Relative: 9 %
Neutro Abs: 3.3 10*3/uL (ref 1.7–7.7)
Neutrophils Relative %: 74 %
Platelet Count: 214 10*3/uL (ref 150–400)
RBC: 3.21 MIL/uL — ABNORMAL LOW (ref 3.87–5.11)
RDW: 12.4 % (ref 11.5–15.5)
WBC Count: 4.4 10*3/uL (ref 4.0–10.5)
nRBC: 0 % (ref 0.0–0.2)

## 2021-11-07 LAB — CMP (CANCER CENTER ONLY)
ALT: 32 U/L (ref 0–44)
AST: 56 U/L — ABNORMAL HIGH (ref 15–41)
Albumin: 4.4 g/dL (ref 3.5–5.0)
Alkaline Phosphatase: 55 U/L (ref 38–126)
Anion gap: 6 (ref 5–15)
BUN: 27 mg/dL — ABNORMAL HIGH (ref 8–23)
CO2: 23 mmol/L (ref 22–32)
Calcium: 9.7 mg/dL (ref 8.9–10.3)
Chloride: 109 mmol/L (ref 98–111)
Creatinine: 1.53 mg/dL — ABNORMAL HIGH (ref 0.44–1.00)
GFR, Estimated: 38 mL/min — ABNORMAL LOW (ref >60)
Glucose, Bld: 93 mg/dL (ref 70–99)
Potassium: 4.7 mmol/L (ref 3.5–5.1)
Sodium: 138 mmol/L (ref 135–145)
Total Bilirubin: 1 mg/dL (ref 0.3–1.2)
Total Protein: 7 g/dL (ref 6.5–8.1)

## 2021-11-07 LAB — IRON AND IRON BINDING CAPACITY (CC-WL,HP ONLY)
Iron: 81 ug/dL (ref 28–170)
Saturation Ratios: 26 % (ref 10.4–31.8)
TIBC: 308 ug/dL (ref 250–450)
UIBC: 227 ug/dL

## 2021-11-07 LAB — FERRITIN: Ferritin: 113 ng/mL (ref 11–307)

## 2021-11-08 ENCOUNTER — Inpatient Hospital Stay (HOSPITAL_BASED_OUTPATIENT_CLINIC_OR_DEPARTMENT_OTHER): Payer: Managed Care, Other (non HMO) | Admitting: Hematology and Oncology

## 2021-11-08 DIAGNOSIS — N189 Chronic kidney disease, unspecified: Secondary | ICD-10-CM

## 2021-11-08 DIAGNOSIS — D631 Anemia in chronic kidney disease: Secondary | ICD-10-CM | POA: Diagnosis not present

## 2021-11-08 NOTE — Progress Notes (Signed)
Patient Care Team: [name], [name], MD as PCP - General (Family Medicine)  DIAGNOSIS:  Encounter Diagnosis  Name Primary?   Anemia of renal disease     CHIEF COMPLIANT: Follow-up of anemia  INTERVAL HISTORY: Karen [name] is a 61 y.o. with above-mentioned history of anemia on treatment with Retacrit and Plaquenil. She presents to the clinic today for follow-up. She states that everything is fine.   ALLERGIES:  is allergic to milk-related compounds.  MEDICATIONS:  Current Outpatient Medications  Medication Sig Dispense Refill   amLODipine (NORVASC) 5 MG tablet TAKE 1/2 TABLET BY MOUTH DAILY 45 tablet 2   buPROPion (WELLBUTRIN XL) 150 MG 24 hr tablet Take 150 mg by mouth daily.     escitalopram (LEXAPRO) 20 MG tablet Take 20 mg by mouth daily.     FOLIC ACID PO Take by mouth daily.     hydroxychloroquine (PLAQUENIL) 200 MG tablet Take 200 mg by mouth 2 (two) times daily.     levothyroxine (SYNTHROID) 88 MCG tablet TAKE 1 TABLET BY MOUTH EVERY DAY. (STOP THE 125 MCG DOSE) 30 tablet 2   omeprazole (PRILOSEC) 20 MG capsule TAKE 1 CAPSULE BY MOUTH EVERY DAY 30 capsule 5   predniSONE (DELTASONE) 10 MG tablet Take by mouth.     risperiDONE (RISPERDAL) 1 MG tablet Take 1 mg by mouth at bedtime.  1   Vitamin D, Ergocalciferol, (DRISDOL) 1.25 MG (50000 UNIT) CAPS capsule TAKE 1 CAPSULE BY MOUTH EVERY 14 (FOURTEEN) DAYS. FOR 8 WEEKS. THEN EVERY 2 WEEKS. 6 capsule 2   No current facility-administered medications for this visit.    PHYSICAL EXAMINATION: ECOG PERFORMANCE STATUS: 1 - Symptomatic but completely ambulatory  Vitals:   11/08/21 1425  BP: (!) 136/58  Pulse: 83  Resp: 18  Temp: 97.9 F (36.6 C)  SpO2: 100%   Filed Weights   11/08/21 1425  Weight: 203 lb 3.2 oz (92.2 kg)      LABORATORY DATA:  I have reviewed the data as listed    Latest Ref Rng & Units 11/07/2021    2:24 PM 02/19/2021    9:48 AM 12/27/2020    9:53 AM  CMP  Glucose 70 - 99 mg/dL 93  94  87    BUN 8 - 23 mg/dL '27  16  15   ' Creatinine 0.44 - 1.00 mg/dL 1.53  1.24  1.23   Sodium 135 - 145 mmol/L 138  141  137   Potassium 3.5 - 5.1 mmol/L 4.7  4.0  4.2   Chloride 98 - 111 mmol/L 109  109  102   CO2 22 - 32 mmol/L '23  25  28   ' Calcium 8.9 - 10.3 mg/dL 9.7  8.9  9.2   Total Protein 6.5 - 8.1 g/dL 7.0  6.8    Total Bilirubin 0.3 - 1.2 mg/dL 1.0  0.4    Alkaline Phos 38 - 126 U/L 55  56    AST 15 - 41 U/L 56  17    ALT 0 - 44 U/L 32  10      Lab Results  Component Value Date   WBC 4.4 11/07/2021   HGB 9.7 (L) 11/07/2021   HCT 30.0 (L) 11/07/2021   MCV 93.5 11/07/2021   PLT 214 11/07/2021   NEUTROABS 3.3 11/07/2021    ASSESSMENT & PLAN:  Anemia of renal disease Previous iron infusion: June 2018 ESA therapy: Previously received Aranesp 2-3 times per year from 2014 (with Dr.  Ennever) Current treatment: Retacrit 20,000 units monthly started 10/02/2018 (2 doses received so far) goal: Hemoglobin of 10 or above Last Retacrit 10/29/2019  Bone marrow biopsy performed because of anemia and leukopenia: Normocellular bone marrow with trilineage hematopoiesis with mild megakaryocytic atypia   Lab review:   07/21/2019: Hemoglobin 9.2, WBC was 5.2 and platelet count 203 09/01/2019: Hb 9, WBC 2.2 ANC 1.2 12/31/2019: Hemoglobin 9.1 (Retacrit was not given because of insurance issues) 02/17/2020: Hemoglobin 9.3 05/17/20: Hemoglobin 10.1, ANC 1.3 11/08/2020: Hemoglobin 9.2 11/08/2021: Hemoglobin 9.7  Patient is going through treatment for lupus through [facility].   I discussed with her that the hemoglobin is below 10 g but she is completely asymptomatic.   She had Retacrit September 2021 and after that she has not had any further injections. Return to clinic in 6 months with labs and follow-up   Orders Placed This Encounter  Procedures   CBC with Differential ([place] Only)    Standing Status:   Future    Standing Expiration Date:   11/09/2022   The patient has a good  understanding of the overall plan. she agrees with it. she will call with any problems that may develop before the next visit here. Total time spent: 30 mins including face to face time and time spent for planning, charting and co-ordination of care   Harriette Ohara, MD 11/08/21    I Gardiner Coins am scribing for Dr. Lindi Adie  I have reviewed the above documentation for accuracy and completeness, and I agree with the above.

## 2021-11-08 NOTE — Assessment & Plan Note (Signed)
Previous iron infusion: June 2018 ESA therapy: Previously received Aranesp 2-3 times per year from 2014(with Dr. Marin Olp) Current treatment: Retacrit 20,000 units monthly started 10/02/2018(2 doses received so far) goal: Hemoglobin of 10 or above Last Retacrit 10/29/2019  Bone marrow biopsy performed because of anemia and leukopenia: Normocellular bone marrow with trilineage hematopoiesis with mild megakaryocytic atypia  Lab review: 07/21/2019: Hemoglobin 9.2,WBC was 5.2 and platelet count 203 09/01/2019:Hb 9, WBC 2.2 ANC 1.2 12/31/2019: Hemoglobin 9.1 (Retacrit was not given because of insurance issues) 02/17/2020:Hemoglobin 9.3 05/17/20:Hemoglobin 10.1, ANC 1.3 11/08/2020: Hemoglobin 9.2  I discussed with her that the hemoglobin is below 10 g but she is completely asymptomatic.   She had Retacrit September 2021 and after that she has not had any further injections.

## 2021-11-28 ENCOUNTER — Other Ambulatory Visit: Payer: Self-pay | Admitting: Family Medicine

## 2021-11-28 ENCOUNTER — Encounter: Payer: Self-pay | Admitting: Family

## 2021-11-28 ENCOUNTER — Ambulatory Visit (INDEPENDENT_AMBULATORY_CARE_PROVIDER_SITE_OTHER): Payer: Managed Care, Other (non HMO) | Admitting: Family Medicine

## 2021-11-28 ENCOUNTER — Encounter: Payer: Self-pay | Admitting: Family Medicine

## 2021-11-28 VITALS — BP 120/66 | HR 75 | Temp 97.6°F | Ht 69.0 in | Wt 193.9 lb

## 2021-11-28 DIAGNOSIS — E039 Hypothyroidism, unspecified: Secondary | ICD-10-CM

## 2021-11-28 DIAGNOSIS — N3001 Acute cystitis with hematuria: Secondary | ICD-10-CM | POA: Diagnosis not present

## 2021-11-28 DIAGNOSIS — R319 Hematuria, unspecified: Secondary | ICD-10-CM

## 2021-11-28 LAB — POC URINALSYSI DIPSTICK (AUTOMATED)
Bilirubin, UA: NEGATIVE
Glucose, UA: NEGATIVE
Ketones, UA: NEGATIVE
Nitrite, UA: POSITIVE
Protein, UA: POSITIVE — AB
Spec Grav, UA: >=1.03 — AB (ref 1.010–1.025)
Urobilinogen, UA: 0.2 E.U./dL
pH, UA: 6 (ref 5.0–8.0)

## 2021-11-28 MED ORDER — CEPHALEXIN 500 MG PO CAPS: 500.0000 mg | ORAL_CAPSULE | Freq: Three times a day (TID) | ORAL | 0 refills | Status: DC

## 2021-11-28 NOTE — Progress Notes (Signed)
Established Patient Office Visit  Subjective   Patient ID: Karen [name], female    DOB: Jun 25, 1960  Age: 61 y.o. MRN: [id number]  Chief Complaint  Patient presents with   Hematuria    X1 day   Urinary Frequency    HPI   Patient seen with 1 day history of some urine urgency and little bit of blood-tinged mucus this morning.  No fever.  Denies any nausea or vomiting.  No flank pain.  Has had UTIs in the past but not recently.  No recent instrumentation of bladder.  She does have history of lupus and is on Plaquenil.  No recent vaginal bleeding or vaginal spotting.  Past Medical History:  Diagnosis Date   Anemia    IDA-not taking meds at this time due to insurance   Barrett esophagus    Depression    hx of   DUB (dysfunctional uterine bleeding)    GERD (gastroesophageal reflux disease)    on meds   Graves disease    Hypertension    on meds   Hypothyroidism    on meds   Past Surgical History:  Procedure Laterality Date   bunionectomy Left    CHOLECYSTECTOMY N/A 07/16/2012   Procedure: LAPAROSCOPIC CHOLECYSTECTOMY WITH INTRAOPERATIVE CHOLANGIOGRAM;  Surgeon: Joyice Faster. Cornett, MD;  Location: [place];  Service: General;  Laterality: N/A;   COLONOSCOPY  2011   MS-F/V-moveiprep(food)-polypoid   UPPER GASTROINTESTINAL ENDOSCOPY  2019   MS-MAC-recall 3 yrs    reports that she quit smoking about 34 years ago. Her smoking use included cigarettes. She started smoking about 54 years ago. She has a 20.00 pack-year smoking history. She has never used smokeless tobacco. She reports that she does not drink alcohol and does not use drugs. family history includes Asthma in her mother; Breast cancer (age of onset: 2) in her maternal aunt; Cancer (age of onset: 62) in her father; Hypertension in her mother. Allergies  Allergen Reactions   Milk-Related Compounds Other (See Comments)    Intolerance to milk products-causes gas    Review of Systems  Constitutional:  Negative for chills  and fever.  Gastrointestinal:  Negative for abdominal pain, nausea and vomiting.  Genitourinary:  Positive for frequency and urgency. Negative for flank pain.      Objective:     BP 120/66 (BP Location: Left Arm, Patient Position: Sitting, Cuff Size: Normal)   Pulse 75   Temp 97.6 F (36.4 C) (Oral)   Ht _0  (1.753 m)   Wt 193 lb 14.4 oz (88 kg)   SpO2 98%   BMI 28.63 kg/m    Physical Exam Vitals reviewed.  Constitutional:      Appearance: Normal appearance.  Cardiovascular:     Rate and Rhythm: Normal rate and regular rhythm.  Pulmonary:     Effort: Pulmonary effort is normal.     Breath sounds: Normal breath sounds.  Neurological:     Mental Status: She is alert.      Results for orders placed or performed in visit on 11/28/21  POCT Urinalysis Dipstick (Automated)  Result Value Ref Range   Color, UA yellow    Clarity, UA cloudy    Glucose, UA Negative Negative   Bilirubin, UA negative    Ketones, UA negative    Spec Grav, UA >=1.030 (A) 1.010 - 1.025   Blood, UA 3+    pH, UA 6.0 5.0 - 8.0   Protein, UA Positive (A) Negative   Urobilinogen, UA  0.2 0.2 or 1.0 E.U./dL   Nitrite, UA positive    Leukocytes, UA Moderate (2+) (A) Negative      The 10-year ASCVD risk score (Arnett DK, et al., 2019) is: 5.8%    Assessment & Plan:   Problem List Items Addressed This Visit   None Visit Diagnoses     Hematuria, unspecified type    -  Primary   Relevant Orders   POCT Urinalysis Dipstick (Automated) (Completed)   Urine Culture     Symptoms and urine dipstick suggest probable UTI.  She has positive leukocytes, nitrites, and blood.  Patient nontoxic.  No fever.  -Urine culture sent -Keflex 500 mg 3 times daily for 7 days -Increase fluid intake -Follow-up for any persistent or worsening symptoms  No follow-ups on file.    Carolann Littler, MD

## 2021-11-29 ENCOUNTER — Ambulatory Visit (INDEPENDENT_AMBULATORY_CARE_PROVIDER_SITE_OTHER): Payer: Managed Care, Other (non HMO)

## 2021-11-29 ENCOUNTER — Ambulatory Visit (INDEPENDENT_AMBULATORY_CARE_PROVIDER_SITE_OTHER): Payer: Managed Care, Other (non HMO) | Admitting: Podiatry

## 2021-11-29 ENCOUNTER — Encounter: Payer: Self-pay | Admitting: Family

## 2021-11-29 DIAGNOSIS — M216X2 Other acquired deformities of left foot: Secondary | ICD-10-CM

## 2021-11-29 DIAGNOSIS — M778 Other enthesopathies, not elsewhere classified: Secondary | ICD-10-CM

## 2021-11-29 DIAGNOSIS — L84 Corns and callosities: Secondary | ICD-10-CM | POA: Diagnosis not present

## 2021-11-29 DIAGNOSIS — M79672 Pain in left foot: Secondary | ICD-10-CM

## 2021-11-29 MED ORDER — UREA 40 % EX CREA: 1.0000 | TOPICAL_CREAM | Freq: Every day | CUTANEOUS | 1 refills | Status: DC

## 2021-11-29 NOTE — Progress Notes (Signed)
Sub 3 /5 callus Urea

## 2021-12-01 LAB — URINE CULTURE
MICRO NUMBER:: 14099677
SPECIMEN QUALITY:: ADEQUATE

## 2021-12-02 NOTE — Progress Notes (Signed)
Subjective:   Patient ID: Karen [name], female   DOB: 61 y.o.   MRN: [id number]   HPI Chief Complaint  Patient presents with   Callouses    Callus on the left forefoot, Started having pain 3 months ago since going back to work in Aug, Rate of pain 7 out of 10, aches, had surg 30 years ago on left foot for bunion    61 year old female presents with above concerns.  She said the calluses causing pain.  Denies any swelling redness or any drainage.   Review of Systems  All other systems reviewed and are negative.  Past Medical History:  Diagnosis Date   Anemia    IDA-not taking meds at this time due to insurance   Barrett esophagus    Depression    hx of   DUB (dysfunctional uterine bleeding)    GERD (gastroesophageal reflux disease)    on meds   Graves disease    Hypertension    on meds   Hypothyroidism    on meds    Past Surgical History:  Procedure Laterality Date   bunionectomy Left    CHOLECYSTECTOMY N/A 07/16/2012   Procedure: LAPAROSCOPIC CHOLECYSTECTOMY WITH INTRAOPERATIVE CHOLANGIOGRAM;  Surgeon: Joyice Faster. Cornett, MD;  Location: [place];  Service: General;  Laterality: N/A;   COLONOSCOPY  2011   MS-F/V-moveiprep(food)-polypoid   UPPER GASTROINTESTINAL ENDOSCOPY  2019   MS-MAC-recall 3 yrs     Current Outpatient Medications:    urea (CARMOL) 40 % CREA, Apply 1 Application topically daily., Disp: 198 g, Rfl: 1   amLODipine (NORVASC) 5 MG tablet, TAKE 1/2 TABLET BY MOUTH DAILY, Disp: 45 tablet, Rfl: 2   buPROPion (WELLBUTRIN XL) 150 MG 24 hr tablet, Take 150 mg by mouth daily., Disp: , Rfl:    cephALEXin (KEFLEX) 500 MG capsule, Take 1 capsule (500 mg total) by mouth 3 (three) times daily., Disp: 21 capsule, Rfl: 0   escitalopram (LEXAPRO) 20 MG tablet, Take 20 mg by mouth daily., Disp: , Rfl:    FOLIC ACID PO, Take by mouth daily., Disp: , Rfl:    hydroxychloroquine (PLAQUENIL) 200 MG tablet, Take 200 mg by mouth 2 (two) times daily., Disp: , Rfl:     levothyroxine (SYNTHROID) 88 MCG tablet, TAKE 1 TABLET BY MOUTH EVERY DAY. (STOP THE 125 MCG DOSE), Disp: 30 tablet, Rfl: 2   omeprazole (PRILOSEC) 20 MG capsule, TAKE 1 CAPSULE BY MOUTH EVERY DAY, Disp: 30 capsule, Rfl: 5   predniSONE (DELTASONE) 10 MG tablet, Take by mouth., Disp: , Rfl:    risperiDONE (RISPERDAL) 1 MG tablet, Take 1 mg by mouth at bedtime., Disp: , Rfl: 1   Vitamin D, Ergocalciferol, (DRISDOL) 1.25 MG (50000 UNIT) CAPS capsule, TAKE 1 CAPSULE BY MOUTH EVERY 14 (FOURTEEN) DAYS. FOR 8 WEEKS. THEN EVERY 2 WEEKS., Disp: 6 capsule, Rfl: 2  Allergies  Allergen Reactions   Milk-Related Compounds Other (See Comments)    Intolerance to milk products-causes gas           Objective:  Physical Exam  General: AAO x3, NAD  Dermatological: Hyperkeratotic lesion left foot submetatarsal 3 and 5.  Upon debridement there is no underlying ulceration drainage or signs of infection.  No evidence of foreign body.  No open lesions.  Vascular: Dorsalis Pedis artery and Posterior Tibial artery pedal pulses are 2/4 bilateral with immedate capillary fill time. There is no pain with calf compression, swelling, warmth, erythema.   Neruologic: Grossly intact via light touch bilateral.  Musculoskeletal: Decreased range of motion of first MPJ.  Prominent metatarsal heads.  Tenderness along the callus areas but no other areas of discomfort.  Gait: Unassisted, Nonantalgic.       Assessment:   61 year old hyperkeratotic lesions, prominent metatarsal heads left foot     Plan:  -Treatment options discussed including all alternatives, risks, and complications -Etiology of symptoms were discussed -X-rays were obtained and reviewed with the patient.  3 views left foot were obtained.  Residual bunion is noted with arthritic changes present.  Previous stress fracture noted to the second metatarsal. -Discussed both conservative as well as surgical options.  Today I sharply debrided the calluses  without any complications or bleeding.  Recommended urea cream to applied.  Discussed offloading.  Dispensed offloading pads but consider custom orthotics to help offload as well if needed.  Also if symptoms continue he can always consider surgical intervention if needed.  Trula Slade DPM

## 2021-12-04 ENCOUNTER — Other Ambulatory Visit: Payer: Self-pay | Admitting: Podiatry

## 2021-12-04 DIAGNOSIS — M778 Other enthesopathies, not elsewhere classified: Secondary | ICD-10-CM

## 2021-12-07 ENCOUNTER — Ambulatory Visit: Payer: Managed Care, Other (non HMO) | Admitting: Podiatry

## 2022-01-17 ENCOUNTER — Telehealth: Payer: Self-pay

## 2022-01-17 NOTE — Telephone Encounter (Signed)
---  Caller states she had issue with urination on Nov 1 and cephalexin 7 days and completed. Caller states she had blood in urine and frequency and cloudy today. This nurse had phone issues and got disconnected with caller and not able to reach her x2 afterwards.  01/14/2022 8:58:20 PM Clinical Call Solocinski, RN, [facility]  Comments User: Lois Huxley, RN Date/Time Eilene Ghazi Time): 01/14/2022 7:12:32 PM when dialed and line connected got message to enter id followed by #.  01/17/22 1220 - LVM to see if patient need to schedule in office appt with any provider.

## 2022-01-29 ENCOUNTER — Encounter: Payer: Managed Care, Other (non HMO) | Admitting: Family Medicine

## 2022-01-29 NOTE — Progress Notes (Signed)
HPI: Karen [name] is a 61 y.o. female with PMHx significant for iron def anemia,CKD III, lupus, depression,vit D deficiency, hypothyroidism,and HLD here today for her routine physical.  Last CPE: 12/27/2020 She has been exercising by walking once or twice a week and has made dietary changes, resulting in a 33-pound weight loss and elimination of sugar from her diet.  She has been seeing a "natural doctor", at [facility] in [place], and is due for a follow-up visit in six months.  Immunization History  Administered Date(s) Administered   Influenza Split 11/06/2010, 12/18/2011   Influenza Whole 10/22/2007, 10/28/2008, 11/02/2009   Influenza,inj,Quad PF,6+ Mos 11/25/2012, 12/15/2013, 10/11/2015, 11/22/2016, 11/24/2017, 11/24/2018, 11/22/2019, 12/27/2020   PFIZER(Purple Top)SARS-COV-2 Vaccination 04/03/2019, 04/24/2019, 11/26/2019, 05/27/2020   PPD Test 08/22/2014   Pfizer Covid-19 Vaccine Bivalent Booster 21yrs & up 10/28/2020   Pneumococcal Polysaccharide-23 11/22/2016   Td 02/04/1998, 10/28/2008   Tdap 11/24/2018   Zoster Recombinat (Shingrix) 11/24/2017, 01/23/2018, 11/22/2019   Health Maintenance  Topic Date Due   INFLUENZA VACCINE  09/04/2021   COVID-19 Vaccine (6 - 2023-24 season) 02/15/2022 (Originally 10/05/2021)   PAP SMEAR-Modifier  11/25/2022   COLONOSCOPY (Pts 45-50yrs Insurance coverage will need to be confirmed)  03/11/2023   MAMMOGRAM  09/11/2023   DTaP/Tdap/Td (4 - Td or Tdap) 11/23/2028   Hepatitis C Screening  Completed   HIV Screening  Completed   Zoster Vaccines- Shingrix  Completed   HPV VACCINES  Aged Out   Hypertension: Currently she is on amlodipine 2.5 mg daily, she is not checking BP at home. She would like to try without medication. CKD III: In 11/2021 e GFR 38 (50).  Vit D deficiency on Ergocalciferol 50,000 U q 2 weeks.  She reported 1 episode of gross hematuria after being treated for UTI. Currently she is not having any urinary  symptoms.  Lab Results  Component Value Date   CREATININE 1.53 (H) 11/07/2021   BUN 27 (H) 11/07/2021   NA 138 11/07/2021   K 4.7 11/07/2021   CL 109 11/07/2021   CO2 23 11/07/2021   Lab Results  Component Value Date   MICROALBUR <0.7 08/26/2018   MICROALBUR <0.7 11/22/2016   Hyperlipidemia: Currently she is on nonpharmacologic treatment. Lab Results  Component Value Date   CHOL 205 (H) 12/27/2020   HDL 75.50 12/27/2020   LDLCALC 119 (H) 12/27/2020   LDLDIRECT 148.9 10/25/2010   TRIG 54.0 12/27/2020   CHOLHDL 3 12/27/2020   Hypothyroidism: Currently she is on levothyroxine 88 mcg daily. Lab Results  Component Value Date   TSH 0.72 05/04/2021   She follows with rheumatology regularly, on chronic prednisone treatment, 10 mg daily. She sees her psychiatrist every 6 months. She has an appointment with her podiatrist today.  Review of Systems  Constitutional:  Negative for activity change, appetite change and fever.  HENT:  Negative for hearing loss, mouth sores, sore throat and trouble swallowing.   Eyes:  Negative for redness and visual disturbance.  Respiratory:  Negative for cough, shortness of breath and wheezing.   Cardiovascular:  Negative for chest pain and leg swelling.  Gastrointestinal:  Negative for abdominal pain, nausea and vomiting.       No changes in bowel habits.  Endocrine: Negative for cold intolerance, heat intolerance, polydipsia, polyphagia and polyuria.  Genitourinary:  Negative for decreased urine volume, dysuria, hematuria, vaginal bleeding and vaginal discharge.  Musculoskeletal:  Positive for arthralgias. Negative for gait problem and myalgias.  Skin:  Negative for color  change and rash.  Allergic/Immunologic: Negative for environmental allergies.  Neurological:  Negative for seizures, syncope, weakness and headaches.  Hematological:  Negative for adenopathy. Does not bruise/bleed easily.  Psychiatric/Behavioral:  Negative for confusion and  hallucinations.   All other systems reviewed and are negative.  Current Outpatient Medications on File Prior to Visit  Medication Sig Dispense Refill   amLODipine (NORVASC) 5 MG tablet TAKE 1/2 TABLET BY MOUTH DAILY 45 tablet 2   buPROPion (WELLBUTRIN XL) 150 MG 24 hr tablet Take 150 mg by mouth daily.     escitalopram (LEXAPRO) 20 MG tablet Take 20 mg by mouth daily.     FOLIC ACID PO Take by mouth daily.     hydroxychloroquine (PLAQUENIL) 200 MG tablet Take 200 mg by mouth 2 (two) times daily.     levothyroxine (SYNTHROID) 88 MCG tablet TAKE 1 TABLET BY MOUTH EVERY DAY. (STOP THE 125 MCG DOSE) 30 tablet 2   omeprazole (PRILOSEC) 20 MG capsule TAKE 1 CAPSULE BY MOUTH EVERY DAY 30 capsule 5   predniSONE (DELTASONE) 10 MG tablet Take by mouth.     risperiDONE (RISPERDAL) 1 MG tablet Take 1 mg by mouth at bedtime.  1   urea (CARMOL) 40 % CREA Apply 1 Application topically daily. 198 g 1   Vitamin D, Ergocalciferol, (DRISDOL) 1.25 MG (50000 UNIT) CAPS capsule TAKE 1 CAPSULE BY MOUTH EVERY 14 (FOURTEEN) DAYS. FOR 8 WEEKS. THEN EVERY 2 WEEKS. 6 capsule 2   No current facility-administered medications on file prior to visit.   Past Medical History:  Diagnosis Date   Anemia    IDA-not taking meds at this time due to insurance   Barrett esophagus    Depression    hx of   DUB (dysfunctional uterine bleeding)    GERD (gastroesophageal reflux disease)    on meds   Graves disease    Hypertension    on meds   Hypothyroidism    on meds   Past Surgical History:  Procedure Laterality Date   bunionectomy Left    CHOLECYSTECTOMY N/A 07/16/2012   Procedure: LAPAROSCOPIC CHOLECYSTECTOMY WITH INTRAOPERATIVE CHOLANGIOGRAM;  Surgeon: Clovis Pu. Cornett, MD;  Location: [facility] OR;  Service: General;  Laterality: N/A;   COLONOSCOPY  2011   MS-F/V-moveiprep(food)-polypoid   UPPER GASTROINTESTINAL ENDOSCOPY  2019   MS-MAC-recall 3 yrs   Allergies  Allergen Reactions   Milk-Related Compounds Other (See  Comments)    Intolerance to milk products-causes gas    Family History  Problem Relation Age of Onset   Hypertension Mother    Asthma Mother    Cancer Father 75       larynx cancer    Breast cancer Maternal Aunt 60   Colon cancer Neg Hx    Colon polyps Neg Hx    Esophageal cancer Neg Hx    Rectal cancer Neg Hx    Stomach cancer Neg Hx     Social History   Socioeconomic History   Marital status: Married    Spouse name: Not on file   Number of children: 0   Years of education: Not on file   Highest education level: Not on file  Occupational History    Employer: [employer]  Tobacco Use   Smoking status: Former    Packs/day: 1.00    Years: 20.00    Total pack years: 20.00    Types: Cigarettes    Start date: 03/13/1967    Quit date: 07/14/1987    Years since quitting:  34.5   Smokeless tobacco: Never   Tobacco comments:    quit 26 years ago  Vaping Use   Vaping Use: Never used  Substance and Sexual Activity   Alcohol use: No    Alcohol/week: 0.0 standard drinks of alcohol   Drug use: No   Sexual activity: Not on file  Other Topics Concern   Not on file  Social History Narrative   Occupation: Estate agent   Regular exercise- yes   0 caffeine drinks    Social Determinants of Corporate investment banker Strain: Not on file  Food Insecurity: Not on file  Transportation Needs: Not on file  Physical Activity: Not on file  Stress: Not on file  Social Connections: Not on file   Today's Vitals   01/30/22 1112  BP: 124/70  Pulse: 68  Resp: 16  Temp: 98.2 F (36.8 C)  TempSrc: Oral  SpO2: 98%  Weight: 182 lb 2 oz (82.6 kg)  Height: 5\' 9"  (1.753 m)  Body mass index is 26.9 kg/m. Wt Readings from Last 3 Encounters:  01/30/22 182 lb 2 oz (82.6 kg)  11/28/21 193 lb 14.4 oz (88 kg)  11/08/21 203 lb 3.2 oz (92.2 kg)  Physical Exam Vitals and nursing note reviewed.  Constitutional:      General: She is not in acute distress.    Appearance: She is  well-developed.  HENT:     Head: Normocephalic and atraumatic.     Right Ear: Hearing, tympanic membrane, ear canal and external ear normal.     Left Ear: Hearing, tympanic membrane, ear canal and external ear normal.     Mouth/Throat:     Mouth: Mucous membranes are moist.     Pharynx: Oropharynx is clear. Uvula midline.  Eyes:     Extraocular Movements: Extraocular movements intact.     Conjunctiva/sclera: Conjunctivae normal.     Pupils: Pupils are equal, round, and reactive to light.  Neck:     Thyroid: No thyromegaly.     Trachea: No tracheal deviation.  Cardiovascular:     Rate and Rhythm: Normal rate and regular rhythm.     Pulses:          Dorsalis pedis pulses are 2+ on the right side and 2+ on the left side.     Heart sounds: No murmur heard. Pulmonary:     Effort: Pulmonary effort is normal. No respiratory distress.     Breath sounds: Normal breath sounds.  Abdominal:     Palpations: Abdomen is soft. There is no hepatomegaly or mass.     Tenderness: There is no abdominal tenderness.  Genitourinary:    Comments: Deferred to gyn. Musculoskeletal:     Comments: No major deformity or signs of synovitis appreciated.  Lymphadenopathy:     Cervical: No cervical adenopathy.     Upper Body:     Right upper body: No supraclavicular adenopathy.     Left upper body: No supraclavicular adenopathy.  Skin:    General: Skin is warm.     Findings: No erythema or rash.  Neurological:     General: No focal deficit present.     Mental Status: She is alert and oriented to person, place, and time.     Cranial Nerves: No cranial nerve deficit.     Coordination: Coordination normal.     Gait: Gait normal.     Deep Tendon Reflexes:     Reflex Scores:      Bicep reflexes are  2+ on the right side and 2+ on the left side.      Patellar reflexes are 2+ on the right side and 2+ on the left side. Psychiatric:     Comments: Well groomed, good eye contact.   ASSESSMENT AND PLAN: Karen [name] was here today annual physical examination. Lab Results  Component Value Date   CREATININE 1.20 01/30/2022   BUN 20 01/30/2022   NA 141 01/30/2022   K 4.4 01/30/2022   CL 106 01/30/2022   CO2 29 01/30/2022   Lab Results  Component Value Date   ALT 14 01/30/2022   AST 21 01/30/2022   ALKPHOS 42 01/30/2022   BILITOT 0.6 01/30/2022   Lab Results  Component Value Date   MICROALBUR <0.7 01/30/2022   MICROALBUR <0.7 08/26/2018   Lab Results  Component Value Date   TSH 1.34 01/30/2022   Lab Results  Component Value Date   CHOL 167 01/30/2022   HDL 64.80 01/30/2022   LDLCALC 92 01/30/2022   LDLDIRECT 148.9 10/25/2010   TRIG 51.0 01/30/2022   CHOLHDL 3 01/30/2022   Routine general medical examination at a health care facility Assessment & Plan: We discussed the importance of regular physical activity and healthy diet for prevention of chronic illness and/or complications. Preventive guidelines reviewed. Vaccination up to date. Ca++ and vit D supplementation recommended. Next CPE in a year.   Hypothyroidism, unspecified type Assessment & Plan: Problem has been well controlled. Continue levothyroxine 88 mcg daily. Further recommendations according to TSH result.  Orders: -     TSH; Future  Hypertension, essential, benign Assessment & Plan: BP adequately controlled. She would like to try without medication, so she will stop Amlodipine 2.5 mg. Instructed to monitor BP daily for 3-4 weeks and to let me know about readings. F/U in 6 months, before if needed.   Dyslipidemia (high LDL; low HDL) Assessment & Plan: Non pharmacologic treatment recommended for now. Further recommendations will be given according to 10 years CVD risk score and lipid panel numbers.  Orders: -     Lipid panel; Future -     Hepatic function panel; Future  Stage 3a chronic kidney disease (HCC) Assessment & Plan: Last creatinine 1.5 and GFR 38 (50). If renal function is  not back to her baseline, will recommend establishing with nephrologist. Continue adequate hydration and low-salt diet. Stressed the importance of adequate BP control. Continue avoiding NSAIDs.  Orders: -     Microalbumin / creatinine urine ratio; Future -     Basic metabolic panel; Future  Gross hematuria Assessment & Plan: One time episode, completed abx treatment for UTI. Currently she is asymptomatic but until a couple days ago she was still having pressure like sensation. UA with reflex Ucx ordered today.  Orders: -     Urinalysis w microscopic + reflex cultur  Vitamin D deficiency, unspecified Assessment & Plan: Continue ergocalciferol 50,000 units every 2 weeks. Further recommendation will be given according to 25 OH vitamin D result.  Orders: -     VITAMIN D 25 Hydroxy (Vit-D Deficiency, Fractures); Future  Asymptomatic postmenopausal estrogen deficiency -     DG Bone Density; Future  Other orders -     REFLEXIVE URINE CULTURE  Return in 6 months (on 08/01/2022) for chronic problems.  Karen Artz G. [place], MD  [facility]. [place] office.

## 2022-01-30 ENCOUNTER — Encounter: Payer: Self-pay | Admitting: Podiatry

## 2022-01-30 ENCOUNTER — Ambulatory Visit (INDEPENDENT_AMBULATORY_CARE_PROVIDER_SITE_OTHER): Payer: Managed Care, Other (non HMO) | Admitting: Podiatry

## 2022-01-30 ENCOUNTER — Encounter: Payer: Self-pay | Admitting: Family Medicine

## 2022-01-30 ENCOUNTER — Ambulatory Visit: Payer: Managed Care, Other (non HMO) | Admitting: Family Medicine

## 2022-01-30 VITALS — BP 124/70 | HR 68 | Temp 98.2°F | Resp 16 | Ht 69.0 in | Wt 182.1 lb

## 2022-01-30 DIAGNOSIS — E559 Vitamin D deficiency, unspecified: Secondary | ICD-10-CM

## 2022-01-30 DIAGNOSIS — E039 Hypothyroidism, unspecified: Secondary | ICD-10-CM | POA: Diagnosis not present

## 2022-01-30 DIAGNOSIS — L600 Ingrowing nail: Secondary | ICD-10-CM

## 2022-01-30 DIAGNOSIS — M216X2 Other acquired deformities of left foot: Secondary | ICD-10-CM

## 2022-01-30 DIAGNOSIS — E785 Hyperlipidemia, unspecified: Secondary | ICD-10-CM | POA: Diagnosis not present

## 2022-01-30 DIAGNOSIS — Z780 Asymptomatic menopausal state: Secondary | ICD-10-CM

## 2022-01-30 DIAGNOSIS — I10 Essential (primary) hypertension: Secondary | ICD-10-CM

## 2022-01-30 DIAGNOSIS — Z0000 Encounter for general adult medical examination without abnormal findings: Secondary | ICD-10-CM

## 2022-01-30 DIAGNOSIS — N1831 Chronic kidney disease, stage 3a: Secondary | ICD-10-CM | POA: Diagnosis not present

## 2022-01-30 DIAGNOSIS — R310 Gross hematuria: Secondary | ICD-10-CM

## 2022-01-30 LAB — BASIC METABOLIC PANEL
BUN: 20 mg/dL (ref 6–23)
CO2: 29 mEq/L (ref 19–32)
Calcium: 9.1 mg/dL (ref 8.4–10.5)
Chloride: 106 mEq/L (ref 96–112)
Creatinine, Ser: 1.2 mg/dL (ref 0.40–1.20)
GFR: 48.45 mL/min — ABNORMAL LOW (ref >60.00)
Glucose, Bld: 85 mg/dL (ref 70–99)
Potassium: 4.4 mEq/L (ref 3.5–5.1)
Sodium: 141 mEq/L (ref 135–145)

## 2022-01-30 LAB — LIPID PANEL
Cholesterol: 167 mg/dL (ref 0–200)
HDL: 64.8 mg/dL (ref >39.00)
LDL Cholesterol: 92 mg/dL (ref 0–99)
NonHDL: 102.18
Total CHOL/HDL Ratio: 3
Triglycerides: 51 mg/dL (ref 0.0–149.0)
VLDL: 10.2 mg/dL (ref 0.0–40.0)

## 2022-01-30 LAB — HEPATIC FUNCTION PANEL
ALT: 14 U/L (ref 0–35)
AST: 21 U/L (ref 0–37)
Albumin: 4 g/dL (ref 3.5–5.2)
Alkaline Phosphatase: 42 U/L (ref 39–117)
Bilirubin, Direct: 0.2 mg/dL (ref 0.0–0.3)
Total Bilirubin: 0.6 mg/dL (ref 0.2–1.2)
Total Protein: 6.7 g/dL (ref 6.0–8.3)

## 2022-01-30 LAB — VITAMIN D 25 HYDROXY (VIT D DEFICIENCY, FRACTURES): VITD: 36.89 ng/mL (ref 30.00–100.00)

## 2022-01-30 LAB — TSH: TSH: 1.34 u[IU]/mL (ref 0.35–5.50)

## 2022-01-30 LAB — MICROALBUMIN / CREATININE URINE RATIO
Creatinine,U: 81.4 mg/dL
Microalb Creat Ratio: 0.9 mg/g (ref 0.0–30.0)
Microalb, Ur: <0.7 mg/dL (ref 0.0–1.9)

## 2022-01-30 NOTE — Progress Notes (Signed)
Subjective:   Patient ID: Karen [name], female   DOB: 61 y.o.   MRN: [id number]   HPI Patient presents stating she is gone in a lot of pain in the left big toenail and she thinks she needs to have it removed she does need to work the next few days.  States it has been very sore and that she returns to her other job next week and she has lesions on the bottom of the foot   ROS      Objective:  Physical Exam  Neurovascular status intact with a damaged thickened painful big toenail left that is dystrophic with chronic keratotic lesion formation left over right     Assessment:  Ingrown toenail deformity with structural changes left big toe and chronic lesion formation     Plan:  Reviewed condition I do think the nail needs to be removed she wants to get this done and we will get a wait to the end of the week so that she does not have as much time off.  She wants to get this done I explained procedure today I debrided lesions and discussed the removal of the nail which will be necessary

## 2022-01-30 NOTE — Assessment & Plan Note (Signed)
BP adequately controlled. She would like to try without medication, so she will stop Amlodipine 2.5 mg. Instructed to monitor BP daily for 3-4 weeks and to let me know about readings. F/U in 6 months, before if needed.

## 2022-01-30 NOTE — Assessment & Plan Note (Signed)
Continue ergocalciferol 50,000 units every 2 weeks. Further recommendation will be given according to 25 OH vitamin D result.

## 2022-01-30 NOTE — Assessment & Plan Note (Signed)
Last creatinine 1.5 and GFR 38 (50). If renal function is not back to her baseline, will recommend establishing with nephrologist. Continue adequate hydration and low-salt diet. Stressed the importance of adequate BP control. Continue avoiding NSAIDs.

## 2022-01-30 NOTE — Patient Instructions (Addendum)
A few things to remember from today's visit:  Routine general medical examination at a health care facility  Hypothyroidism, unspecified type - Plan: TSH  Hypertension, essential, benign  Dyslipidemia (high LDL; low HDL) - Plan: Lipid panel, Hepatic function panel  Stage 3a chronic kidney disease ([place]) - Plan: Microalbumin / creatinine urine ratio, Basic metabolic panel  Gross hematuria - Plan: Urinalysis with Culture Reflex  Vitamin D deficiency, unspecified - Plan: VITAMIN D 25 Hydroxy (Vit-D Deficiency, Fractures)  Asymptomatic postmenopausal estrogen deficiency - Plan: DEXAScan  Stop Amlodipine 2.5 mg and monitor blood pressure daily. If blood pressure starts going up to 130/80's, you need to resume medication.  If you need refills for medications you take chronically, please call your pharmacy. Do not use My Chart to request refills or for acute issues that need immediate attention. If you send a my chart message, it may take a few days to be addressed, specially if I am not in the office.  Please be sure medication list is accurate. If a new problem present, please set up appointment sooner than planned today.  Health Maintenance, Female Adopting a healthy lifestyle and getting preventive care are important in promoting health and wellness. Ask your health care provider about: The right schedule for you to have regular tests and exams. Things you can do on your own to prevent diseases and keep yourself healthy. What should I know about diet, weight, and exercise? Eat a healthy diet  Eat a diet that includes plenty of vegetables, fruits, low-fat dairy products, and lean protein. Do not eat a lot of foods that are high in solid fats, added sugars, or sodium. Maintain a healthy weight Body mass index (BMI) is used to identify weight problems. It estimates body fat based on height and weight. Your health care provider can help determine your BMI and help you achieve or  maintain a healthy weight. Get regular exercise Get regular exercise. This is one of the most important things you can do for your health. Most adults should: Exercise for at least 150 minutes each week. The exercise should increase your heart rate and make you sweat (moderate-intensity exercise). Do strengthening exercises at least twice a week. This is in addition to the moderate-intensity exercise. Spend less time sitting. Even light physical activity can be beneficial. Watch cholesterol and blood lipids Have your blood tested for lipids and cholesterol at 62 years of age, then have this test every 5 years. Have your cholesterol levels checked more often if: Your lipid or cholesterol levels are high. You are older than 62 years of age. You are at high risk for heart disease. What should I know about cancer screening? Depending on your health history and family history, you may need to have cancer screening at various ages. This may include screening for: Breast cancer. Cervical cancer. Colorectal cancer. Skin cancer. Lung cancer. What should I know about heart disease, diabetes, and high blood pressure? Blood pressure and heart disease High blood pressure causes heart disease and increases the risk of stroke. This is more likely to develop in people who have high blood pressure readings or are overweight. Have your blood pressure checked: Every 3-5 years if you are 16-65 years of age. Every year if you are 36 years old or older. Diabetes Have regular diabetes screenings. This checks your fasting blood sugar level. Have the screening done: Once every three years after age 71 if you are at a normal weight and have a low risk for  diabetes. More often and at a younger age if you are overweight or have a high risk for diabetes. What should I know about preventing infection? Hepatitis B If you have a higher risk for hepatitis B, you should be screened for this virus. Talk with your health  care provider to find out if you are at risk for hepatitis B infection. Hepatitis C Testing is recommended for: Everyone born from [birth year] through 1966. Anyone with known risk factors for hepatitis C. Sexually transmitted infections (STIs) Get screened for STIs, including gonorrhea and chlamydia, if: You are sexually active and are younger than 62 years of age. You are older than 62 years of age and your health care provider tells you that you are at risk for this type of infection. Your sexual activity has changed since you were last screened, and you are at increased risk for chlamydia or gonorrhea. Ask your health care provider if you are at risk. Ask your health care provider about whether you are at high risk for HIV. Your health care provider may recommend a prescription medicine to help prevent HIV infection. If you choose to take medicine to prevent HIV, you should first get tested for HIV. You should then be tested every 3 months for as long as you are taking the medicine. Pregnancy If you are about to stop having your period (premenopausal) and you may become pregnant, seek counseling before you get pregnant. Take 400 to 800 micrograms (mcg) of folic acid every day if you become pregnant. Ask for birth control (contraception) if you want to prevent pregnancy. Osteoporosis and menopause Osteoporosis is a disease in which the bones lose minerals and strength with aging. This can result in bone fractures. If you are 41 years old or older, or if you are at risk for osteoporosis and fractures, ask your health care provider if you should: Be screened for bone loss. Take a calcium or vitamin D supplement to lower your risk of fractures. Be given hormone replacement therapy (HRT) to treat symptoms of menopause. Follow these instructions at home: Alcohol use Do not drink alcohol if: Your health care provider tells you not to drink. You are pregnant, may be pregnant, or are planning to become  pregnant. If you drink alcohol: Limit how much you have to: 0-1 drink a day. Know how much alcohol is in your drink. In the [place] one drink equals one 12 oz bottle of beer (355 mL), one 5 oz glass of wine (148 mL), or one 1 oz glass of hard liquor (44 mL). Lifestyle Do not use any products that contain nicotine or tobacco. These products include cigarettes, chewing tobacco, and vaping devices, such as e-cigarettes. If you need help quitting, ask your health care provider. Do not use street drugs. Do not share needles. Ask your health care provider for help if you need support or information about quitting drugs. General instructions Schedule regular health, dental, and eye exams. Stay current with your vaccines. Tell your health care provider if: You often feel depressed. You have ever been abused or do not feel safe at home. Summary Adopting a healthy lifestyle and getting preventive care are important in promoting health and wellness. Follow your health care provider's instructions about healthy diet, exercising, and getting tested or screened for diseases. Follow your health care provider's instructions on monitoring your cholesterol and blood pressure. This information is not intended to replace advice given to you by your health care provider. Make sure you discuss any questions  you have with your health care provider. Document Revised: 06/12/2020 Document Reviewed: 06/12/2020 Elsevier Patient Education  [place].

## 2022-01-30 NOTE — Assessment & Plan Note (Signed)
Non pharmacologic treatment recommended for now. Further recommendations will be given according to 10 years CVD risk score and lipid panel numbers. 

## 2022-01-30 NOTE — Assessment & Plan Note (Addendum)
Problem has been well controlled. Continue levothyroxine 88 mcg daily. Further recommendations according to TSH result.

## 2022-01-31 LAB — URINALYSIS W MICROSCOPIC + REFLEX CULTURE
Bacteria, UA: NONE SEEN /HPF
Bilirubin Urine: NEGATIVE
Glucose, UA: NEGATIVE
Hgb urine dipstick: NEGATIVE
Hyaline Cast: NONE SEEN /LPF
Ketones, ur: NEGATIVE
Leukocyte Esterase: NEGATIVE
Nitrites, Initial: NEGATIVE
Protein, ur: NEGATIVE
RBC / HPF: NONE SEEN /HPF (ref 0–2)
Specific Gravity, Urine: 1.015 (ref 1.001–1.035)
Squamous Epithelial / HPF: NONE SEEN /HPF (ref <=5)
WBC, UA: NONE SEEN /HPF (ref 0–5)
pH: 7 (ref 5.0–8.0)

## 2022-01-31 LAB — NO CULTURE INDICATED

## 2022-01-31 NOTE — Assessment & Plan Note (Signed)
We discussed the importance of regular physical activity and healthy diet for prevention of chronic illness and/or complications. Preventive guidelines reviewed. Vaccination up-to-date.  Ca++ and vit D supplementation recommended. Next CPE in a year.       

## 2022-01-31 NOTE — Assessment & Plan Note (Addendum)
One time episode, completed abx treatment for UTI. Currently she is asymptomatic but until a couple days ago she was still having pressure like sensation. UA with reflex Ucx ordered today.

## 2022-02-01 ENCOUNTER — Encounter: Payer: Self-pay | Admitting: Podiatry

## 2022-02-01 ENCOUNTER — Ambulatory Visit (INDEPENDENT_AMBULATORY_CARE_PROVIDER_SITE_OTHER): Payer: Managed Care, Other (non HMO) | Admitting: Podiatry

## 2022-02-01 DIAGNOSIS — L600 Ingrowing nail: Secondary | ICD-10-CM

## 2022-02-01 NOTE — Patient Instructions (Signed)

## 2022-02-01 NOTE — Progress Notes (Signed)
Subjective:   Patient ID: Karen [name], female   DOB: 61 y.o.   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  I infiltrated the left hallux 60 mg like Marcaine mixture sterile prep done and using sterile instrumentation remove the nail exposed matrix applied phenol 5 applications 30 seconds followed by alcohol by sterile dressing and gave instructions on soaks to leave dressing on 24 hours take it off earlier if throbbing were to occur and come in if any questions were to occur

## 2022-03-01 ENCOUNTER — Other Ambulatory Visit: Payer: Self-pay | Admitting: Family Medicine

## 2022-03-01 ENCOUNTER — Encounter: Payer: Self-pay | Admitting: Family

## 2022-03-01 DIAGNOSIS — E039 Hypothyroidism, unspecified: Secondary | ICD-10-CM

## 2022-03-05 ENCOUNTER — Ambulatory Visit (INDEPENDENT_AMBULATORY_CARE_PROVIDER_SITE_OTHER): Payer: BC Managed Care – PPO | Admitting: Podiatry

## 2022-03-05 DIAGNOSIS — M21619 Bunion of unspecified foot: Secondary | ICD-10-CM

## 2022-03-05 DIAGNOSIS — M216X1 Other acquired deformities of right foot: Secondary | ICD-10-CM | POA: Diagnosis not present

## 2022-03-05 DIAGNOSIS — L84 Corns and callosities: Secondary | ICD-10-CM

## 2022-03-05 DIAGNOSIS — M778 Other enthesopathies, not elsewhere classified: Secondary | ICD-10-CM | POA: Diagnosis not present

## 2022-03-05 MED ORDER — CEPHALEXIN 500 MG PO CAPS: 500.0000 mg | ORAL_CAPSULE | Freq: Three times a day (TID) | ORAL | 0 refills | Status: DC

## 2022-03-05 NOTE — Progress Notes (Signed)
Subjective: Chief Complaint  Patient presents with   Callouses    Right foot, ball of foot    62 year old female presents the office today with above concerns.  She recently underwent nail avulsion with Dr. Paulla Dolly she would like to have checked.  She also has a painful callus on the ball of her foot which is causing pain.  She denies any drainage or pus.  No open lesions that she notes.   Objective: AAO x3, NAD DP/PT pulses palpable bilaterally, CRT less than 3 seconds Status post total nail avulsion.  There is granulation wound present.  There is no drainage or pus there is some slight edema still noted.  There is no fluctuance or crepitation.  There is no malodor noted today. Hyperkeratotic lesion submetatarsal without any underlying ulceration drainage or signs of infection.  There is prominence of metatarsal heads.  Decreased range of motion of first MPJ and bunion deformity is noted. No pain with calf compression, swelling, warmth, erythema   Assessment: Hyperkeratotic lesion given prominent metatarsal head  Plan: -All treatment options discussed with the patient including all alternatives, risks, complications.  -Given she still has an open wound on the toe and some mild edema start antibiotics.  Prescribed cephalexin.Recommended continue soaking Epsom salts daily cover with antibiotic ointment and a bandage in the day but leave the area open at nighttime.  Monitor for any signs or symptoms of infection.  She has palpable pulses however if she continues to have healing problems we will order arterial studies. -Debrided hyperkeratotic lesion with any complications or bleeding.  Given the arthritis, bunion as well as prominent metatarsal head to decrease her pain from orthotics to help offload.  Will check orthotic benefits for her. -Patient encouraged to call the office with any questions, concerns, change in symptoms.    Trula Slade DPM

## 2022-03-08 IMAGING — MG DIGITAL SCREENING BILAT W/ TOMO W/ CAD
8 series · 8 of 24 positions shown · non-contrast
Comparison: Previous exam(s).

CLINICAL DATA: Screening.

EXAM:
DIGITAL SCREENING BILATERAL MAMMOGRAM WITH TOMO AND CAD

[R MLO synth-2D]
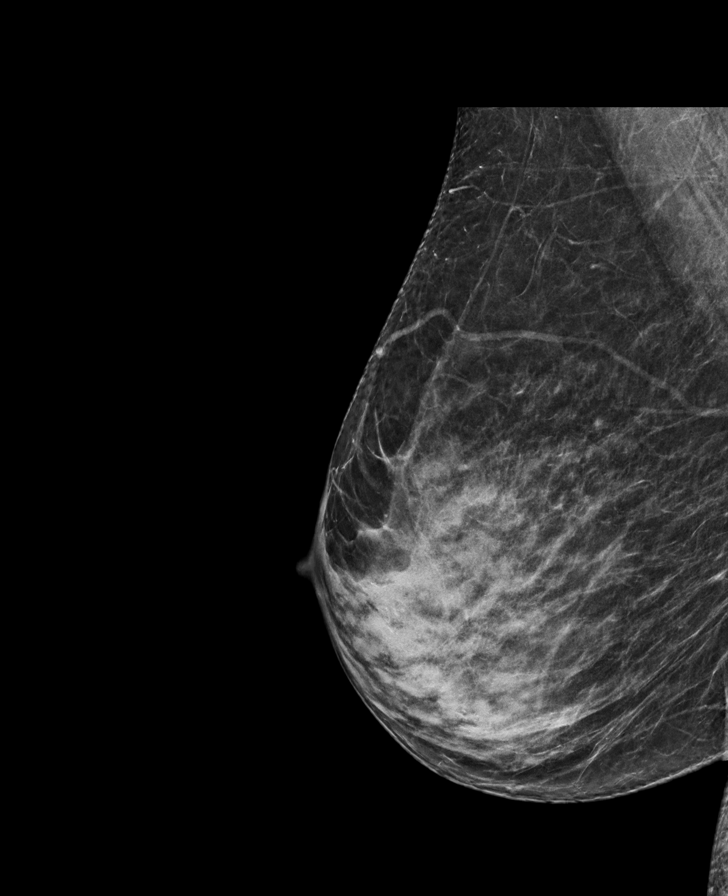

[L CC synth-2D]
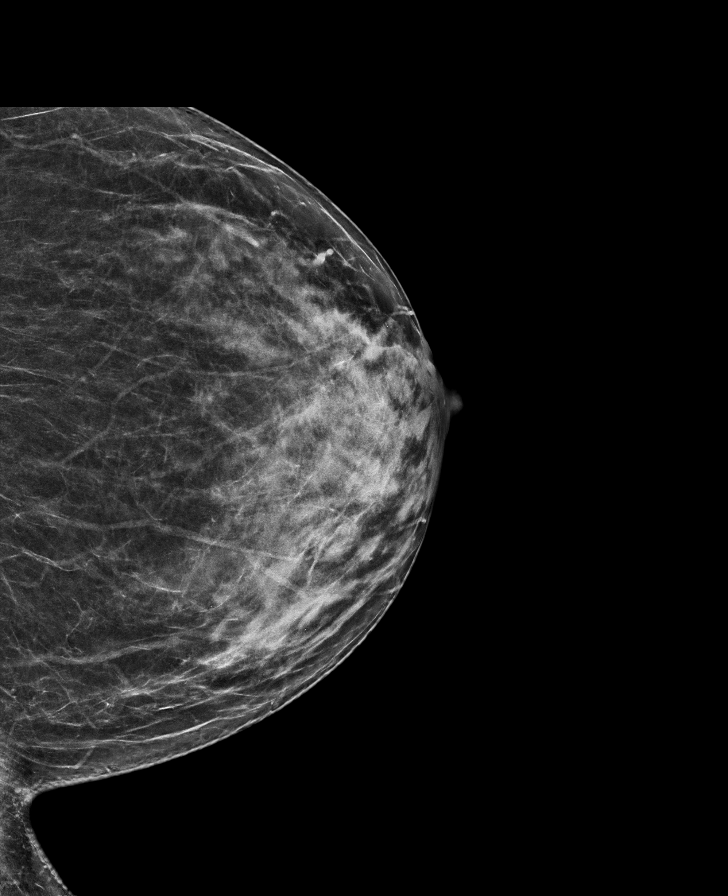

[R CC synth-2D]
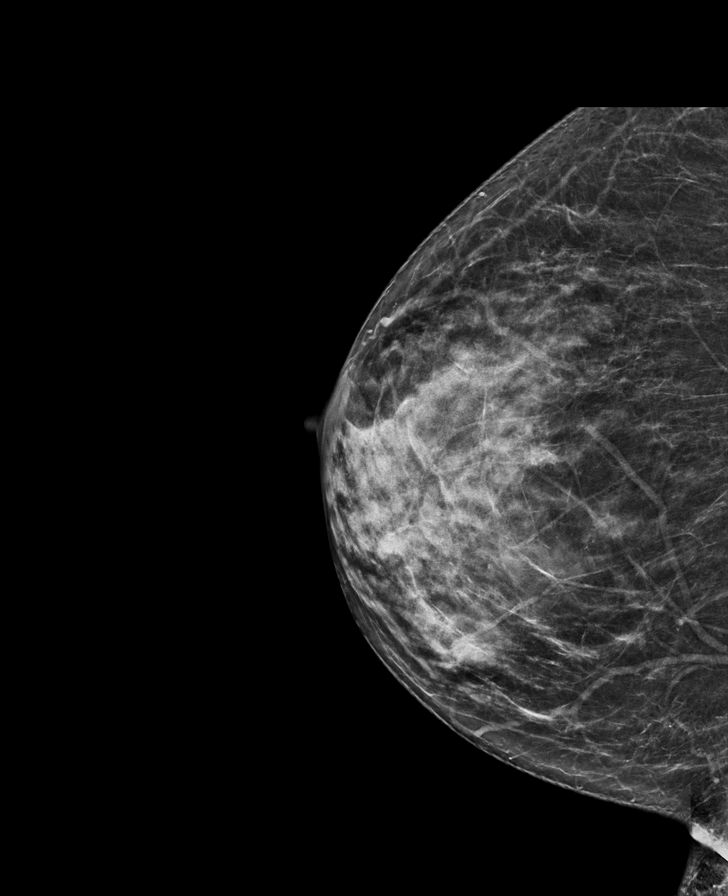

[L MLO synth-2D]
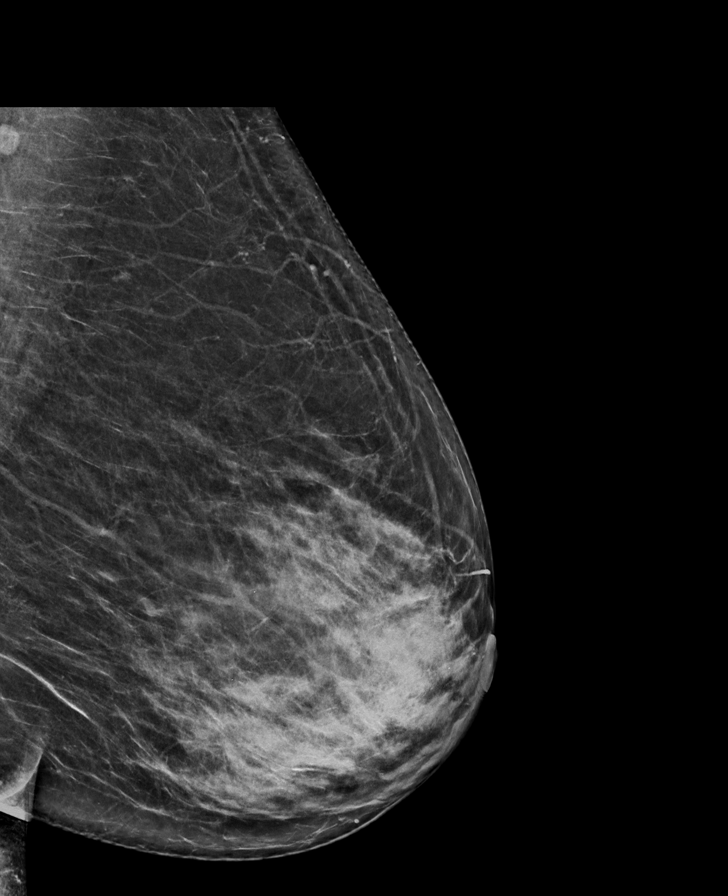

[L CC tomo · tomo slice 35/69.0]
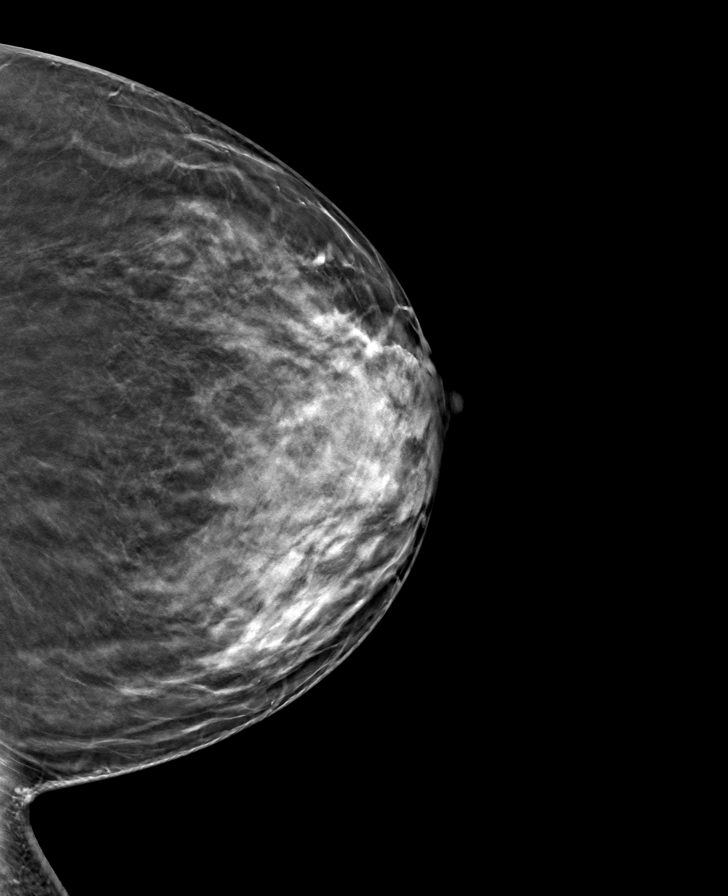

[R MLO tomo · tomo slice 35/69.0]
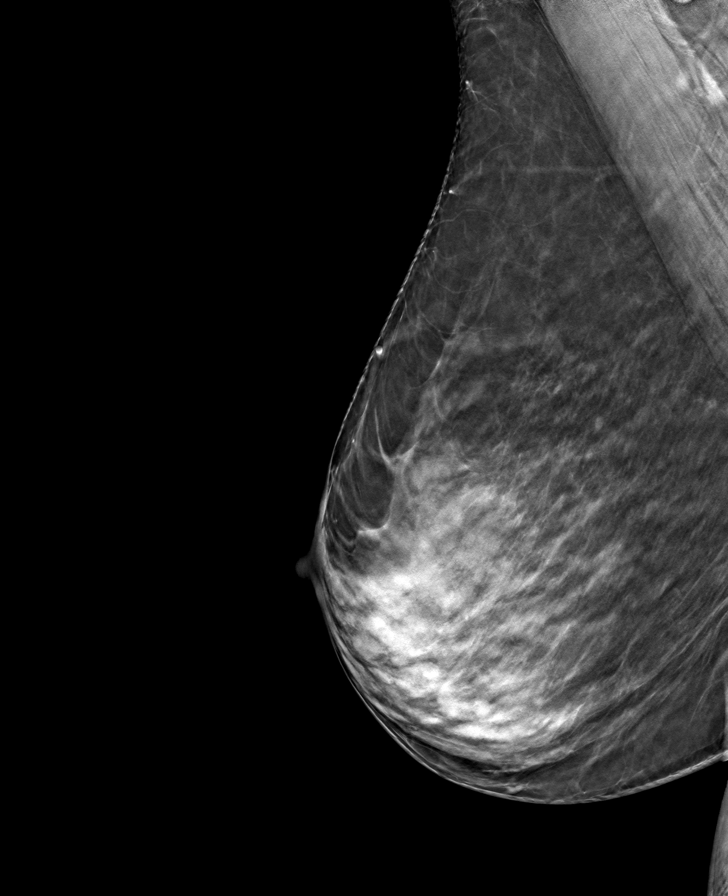

[R CC tomo · tomo slice 33/65.0]
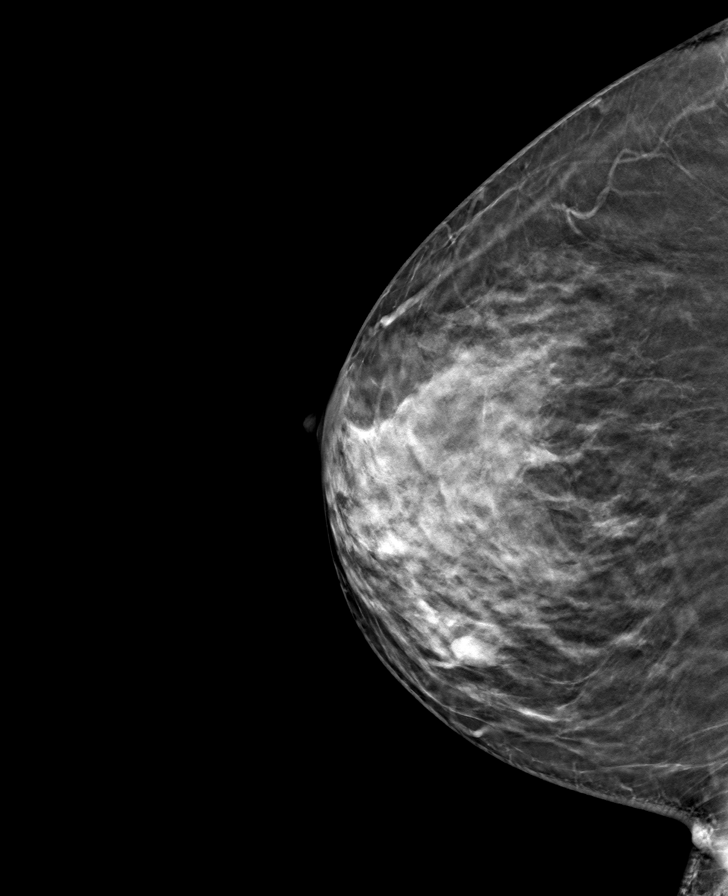

[L MLO tomo · tomo slice 36/71.0]
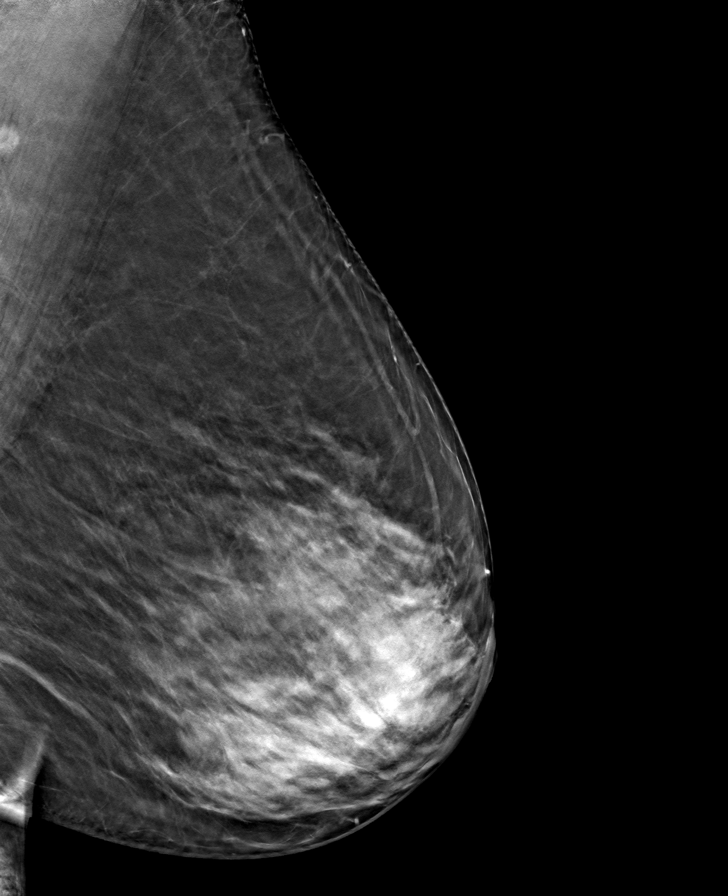

[8 of 24 positions shown; findings below may reference images not displayed]

ACR Breast Density Category c: The breast tissue is heterogeneously
dense, which may obscure small masses.
FINDINGS: There are no findings suspicious for malignancy. Images were
processed with CAD.
IMPRESSION: No mammographic evidence of malignancy. A result letter of this
screening mammogram will be mailed directly to the patient.

RECOMMENDATION:
Screening mammogram in one year. (Code:[id number]-[id number])

BI-RADS CATEGORY  1: Negative.

## 2022-03-12 ENCOUNTER — Telehealth: Payer: Self-pay | Admitting: Podiatry

## 2022-03-12 NOTE — Telephone Encounter (Signed)
Received fax for prior authorization request back  - per [employer] no prior authorization required,  They did not specify how much patient would have to pay, that is based on how much deductible is met.  Sent documentation to the scan center to be scanned in.

## 2022-04-04 ENCOUNTER — Other Ambulatory Visit: Payer: Self-pay | Admitting: Family Medicine

## 2022-04-04 ENCOUNTER — Ambulatory Visit (INDEPENDENT_AMBULATORY_CARE_PROVIDER_SITE_OTHER): Payer: BC Managed Care – PPO | Admitting: Podiatry

## 2022-04-04 ENCOUNTER — Other Ambulatory Visit: Payer: BC Managed Care – PPO

## 2022-04-04 DIAGNOSIS — L600 Ingrowing nail: Secondary | ICD-10-CM

## 2022-04-04 DIAGNOSIS — M216X1 Other acquired deformities of right foot: Secondary | ICD-10-CM | POA: Diagnosis not present

## 2022-04-10 NOTE — Progress Notes (Signed)
Subjective: Chief Complaint  Patient presents with   Follow-up    Toenail check, Patient denies any pain. Patient continues to soak with epsom salt. Patient completed course of antibiotic.    62 year old female presents the office for above concerns.  She has not had any pain with the toe asked any drainage or pus.  She feels that is healing.  There is no other concerns today.   Objective: AAO x3, NAD DP/PT pulses palpable bilaterally, CRT less than 3 seconds Status post nail avulsion which appears to be healing well.  This small scab is present.  There is no change or pus.  There is no fluctuance or crepitus.  No malodor.  No signs of infection.  No pain with calf compression, swelling, warmth, erythema  Assessment: Status post nail avulsion, healing well  Plan: -All treatment options discussed with the patient including all alternatives, risks, complications.  -No other speaking well.  Discussed antibiotic ointment during the day with a bandage and keep it open at nighttime.  Monitor for any signs or symptoms of infection or recurrence -She has been made aware about the orthotics.  She will consider these. -Patient encouraged to call the office with any questions, concerns, change in symptoms.   Trula Slade DPM

## 2022-04-29 ENCOUNTER — Ambulatory Visit (INDEPENDENT_AMBULATORY_CARE_PROVIDER_SITE_OTHER): Payer: 59 | Admitting: Podiatry

## 2022-04-29 ENCOUNTER — Encounter: Payer: Self-pay | Admitting: Podiatry

## 2022-04-29 DIAGNOSIS — B070 Plantar wart: Secondary | ICD-10-CM

## 2022-04-29 NOTE — Progress Notes (Signed)
Subjective:   Patient ID: Karen [name], female   DOB: 63 y.o.   MRN: [id number]   HPI Patient presents with painful lesion also has structural bunion and hammertoe deformity and states that it almost feels like a wart   ROS      Objective:  Physical Exam  Neurovascular status intact with lesion subsecond metatarsal right and also several lesions that have popped up on the left     Assessment:  Probability for verruca plantaris versus plantar callus secondary to bone pressure     Plan:  Sterile prep debrided the areas slight pinpoint bleeding noted apply chemical agent to create immune response sterile dressing explained what to do if blistering occurred reappoint as needed ultimately could require bone work if symptoms persist

## 2022-05-06 ENCOUNTER — Encounter: Payer: Self-pay | Admitting: Family

## 2022-05-11 NOTE — Progress Notes (Signed)
Patient Care Team: [place], [name], MD as PCP - General (Family Medicine)  DIAGNOSIS: No diagnosis found.  SUMMARY OF ONCOLOGIC HISTORY: Oncology History   No history exists.    CHIEF COMPLIANT: Follow-up of anemia   INTERVAL HISTORY: Karen [name] is a 62 y.o. with above-mentioned history of anemia on treatment with Retacrit and Plaquenil. She presents to the clinic today for follow-up.    ALLERGIES:  is allergic to milk-related compounds.  MEDICATIONS:  Current Outpatient Medications  Medication Sig Dispense Refill   buPROPion (WELLBUTRIN XL) 150 MG 24 hr tablet Take 150 mg by mouth daily.     cephALEXin (KEFLEX) 500 MG capsule Take 1 capsule (500 mg total) by mouth 3 (three) times daily. 21 capsule 0   escitalopram (LEXAPRO) 20 MG tablet Take 20 mg by mouth daily.     FOLIC ACID PO Take by mouth daily.     hydroxychloroquine (PLAQUENIL) 200 MG tablet Take 200 mg by mouth 2 (two) times daily.     levothyroxine (SYNTHROID) 88 MCG tablet TAKE 1 TABLET BY MOUTH EVERY DAY. (STOP THE 125 MCG DOSE) 30 tablet 2   omeprazole (PRILOSEC) 20 MG capsule TAKE 1 CAPSULE BY MOUTH EVERY DAY 30 capsule 5   predniSONE (DELTASONE) 10 MG tablet Take by mouth.     risperiDONE (RISPERDAL) 1 MG tablet Take 1 mg by mouth at bedtime.  1   urea (CARMOL) 40 % CREA Apply 1 Application topically daily. 198 g 1   Vitamin D, Ergocalciferol, (DRISDOL) 1.25 MG (50000 UNIT) CAPS capsule TAKE 1 CAPSULE BY MOUTH EVERY 14 (FOURTEEN) DAYS. 6 capsule 2   No current facility-administered medications for this visit.    PHYSICAL EXAMINATION: ECOG PERFORMANCE STATUS: {CHL ONC ECOG PS:[id number]}  There were no vitals filed for this visit. There were no vitals filed for this visit.  BREAST:*** No palpable masses or nodules in either right or left breasts. No palpable axillary supraclavicular or infraclavicular adenopathy no breast tenderness or nipple discharge. (exam performed in the presence of a  chaperone)  LABORATORY DATA:  I have reviewed the data as listed    Latest Ref Rng & Units 01/30/2022   11:56 AM 11/07/2021    2:24 PM 02/19/2021    9:48 AM  CMP  Glucose 70 - 99 mg/dL 85  93  94   BUN 6 - 23 mg/dL 20  27  16    Creatinine 0.40 - 1.20 mg/dL 0.86  5.78  4.69   Sodium 135 - 145 mEq/L 141  138  141   Potassium 3.5 - 5.1 mEq/L 4.4  4.7  4.0   Chloride 96 - 112 mEq/L 106  109  109   CO2 19 - 32 mEq/L 29  23  25    Calcium 8.4 - 10.5 mg/dL 9.1  9.7  8.9   Total Protein 6.0 - 8.3 g/dL 6.7  7.0  6.8   Total Bilirubin 0.2 - 1.2 mg/dL 0.6  1.0  0.4   Alkaline Phos 39 - 117 U/L 42  55  56   AST 0 - 37 U/L 21  56  17   ALT 0 - 35 U/L 14  32  10     Lab Results  Component Value Date   WBC 4.4 11/07/2021   HGB 9.7 (L) 11/07/2021   HCT 30.0 (L) 11/07/2021   MCV 93.5 11/07/2021   PLT 214 11/07/2021   NEUTROABS 3.3 11/07/2021    ASSESSMENT & PLAN:  No problem-specific  Assessment & Plan notes found for this encounter.    No orders of the defined types were placed in this encounter.  The patient has a good understanding of the overall plan. she agrees with it. she will call with any problems that may develop before the next visit here. Total time spent: 30 mins including face to face time and time spent for planning, charting and co-ordination of care   Sherlyn LickDeritra L Alcide Memoli, CMA 05/11/22    I Janan Ridgeeritra, Emoni Whitworth am acting as a Neurosurgeonscribe for [employer] Service[employer] CompanyDr.Vinay Gudena  ***

## 2022-05-13 ENCOUNTER — Inpatient Hospital Stay (HOSPITAL_BASED_OUTPATIENT_CLINIC_OR_DEPARTMENT_OTHER): Payer: BC Managed Care – PPO | Admitting: Hematology and Oncology

## 2022-05-13 ENCOUNTER — Other Ambulatory Visit: Payer: Self-pay | Admitting: *Deleted

## 2022-05-13 ENCOUNTER — Inpatient Hospital Stay: Payer: BC Managed Care – PPO | Attending: Hematology and Oncology

## 2022-05-13 VITALS — BP 124/58 | HR 71 | Temp 97.5°F | Resp 18 | Ht 69.0 in | Wt 180.1 lb

## 2022-05-13 DIAGNOSIS — Z79899 Other long term (current) drug therapy: Secondary | ICD-10-CM | POA: Insufficient documentation

## 2022-05-13 DIAGNOSIS — N189 Chronic kidney disease, unspecified: Secondary | ICD-10-CM | POA: Insufficient documentation

## 2022-05-13 DIAGNOSIS — D631 Anemia in chronic kidney disease: Secondary | ICD-10-CM

## 2022-05-13 LAB — CBC WITH DIFFERENTIAL (CANCER CENTER ONLY)
Abs Immature Granulocytes: 0 10*3/uL (ref 0.00–0.07)
Basophils Absolute: 0 10*3/uL (ref 0.0–0.1)
Basophils Relative: 1 %
Eosinophils Absolute: 0.1 10*3/uL (ref 0.0–0.5)
Eosinophils Relative: 1 %
HCT: 28.1 % — ABNORMAL LOW (ref 36.0–46.0)
Hemoglobin: 9.3 g/dL — ABNORMAL LOW (ref 12.0–15.0)
Immature Granulocytes: 0 %
Lymphocytes Relative: 31 %
Lymphs Abs: 1.1 10*3/uL (ref 0.7–4.0)
MCH: 30 pg (ref 26.0–34.0)
MCHC: 33.1 g/dL (ref 30.0–36.0)
MCV: 90.6 fL (ref 80.0–100.0)
Monocytes Absolute: 0.4 10*3/uL (ref 0.1–1.0)
Monocytes Relative: 12 %
Neutro Abs: 2 10*3/uL (ref 1.7–7.7)
Neutrophils Relative %: 55 %
Platelet Count: 194 10*3/uL (ref 150–400)
RBC: 3.1 MIL/uL — ABNORMAL LOW (ref 3.87–5.11)
RDW: 13 % (ref 11.5–15.5)
WBC Count: 3.6 10*3/uL — ABNORMAL LOW (ref 4.0–10.5)
nRBC: 0 % (ref 0.0–0.2)

## 2022-05-13 NOTE — Assessment & Plan Note (Addendum)
Previous iron infusion: June 2018 ESA therapy: Previously received Aranesp 2-3 times per year from 2014 (with Dr. Myna Hidalgo) Current treatment: Retacrit 20,000 units monthly started 10/02/2018 (2 doses received so far) goal: Hemoglobin of 10 or above Last Retacrit 10/29/2019   Bone marrow biopsy performed because of anemia and leukopenia: Normocellular bone marrow with trilineage hematopoiesis with mild megakaryocytic atypia   Lab review:   07/21/2019: Hemoglobin 9.2, WBC was 5.2 and platelet count 203 09/01/2019: Hb 9, WBC 2.2 ANC 1.2 12/31/2019: Hemoglobin 9.1 (Retacrit was not given because of insurance issues) 02/17/2020: Hemoglobin 9.3 05/17/20: Hemoglobin 10.1, ANC 1.3 11/08/2020: Hemoglobin 9.2 11/08/2021: Hemoglobin 9.7 05/13/2022: Hemoglobin 9.3   Patient is going through treatment for lupus through [facility].  (Hydroxychloroquine and prednisone 1 mg)   We once again discussed the hemoglobin and the symptoms of anemia.  Patient is completely asymptomatic and therefore we decided to watch and monitor with recheck of labs in 6 months and follow-up. She had Retacrit September 2021 and after that she has not had any further injections.  Return to clinic in 6 months with labs and follow-up

## 2022-05-20 ENCOUNTER — Encounter: Payer: Self-pay | Admitting: Family

## 2022-06-07 ENCOUNTER — Other Ambulatory Visit: Payer: Self-pay

## 2022-06-07 DIAGNOSIS — E039 Hypothyroidism, unspecified: Secondary | ICD-10-CM

## 2022-06-07 MED ORDER — LEVOTHYROXINE SODIUM 88 MCG PO TABS
ORAL_TABLET | ORAL | 1 refills | Status: DC
Start: 2022-06-07 — End: 2022-12-09

## 2022-06-12 ENCOUNTER — Other Ambulatory Visit: Payer: Self-pay | Admitting: Family Medicine

## 2022-07-18 ENCOUNTER — Ambulatory Visit (INDEPENDENT_AMBULATORY_CARE_PROVIDER_SITE_OTHER)
Admission: RE | Admit: 2022-07-18 | Discharge: 2022-07-18 | Disposition: A | Discharge status: Home / Self Care | Payer: 59 | Source: Ambulatory Visit | Attending: Family Medicine | Admitting: Family Medicine

## 2022-07-18 DIAGNOSIS — Z780 Asymptomatic menopausal state: Secondary | ICD-10-CM

## 2022-08-30 ENCOUNTER — Encounter: Payer: Self-pay | Admitting: Family Medicine

## 2022-08-30 ENCOUNTER — Ambulatory Visit (INDEPENDENT_AMBULATORY_CARE_PROVIDER_SITE_OTHER): Payer: BC Managed Care – PPO | Admitting: Family Medicine

## 2022-08-30 VITALS — BP 120/80 | HR 60 | Temp 98.3°F | Resp 16 | Ht 69.0 in | Wt 183.2 lb

## 2022-08-30 DIAGNOSIS — S92531D Displaced fracture of distal phalanx of right lesser toe(s), subsequent encounter for fracture with routine healing: Secondary | ICD-10-CM

## 2022-08-30 DIAGNOSIS — F3341 Major depressive disorder, recurrent, in partial remission: Secondary | ICD-10-CM

## 2022-08-30 DIAGNOSIS — M329 Systemic lupus erythematosus, unspecified: Secondary | ICD-10-CM

## 2022-08-30 DIAGNOSIS — M359 Systemic involvement of connective tissue, unspecified: Secondary | ICD-10-CM

## 2022-08-30 DIAGNOSIS — N1831 Chronic kidney disease, stage 3a: Secondary | ICD-10-CM | POA: Insufficient documentation

## 2022-08-30 NOTE — Progress Notes (Unsigned)
HPI: [name] is a 62 y.o. female, who is here today to follow on recent ED visit. Evaluated in the ED on 08/10/2022, diagnosed with closed displaced fracture of proximal phalanx of lesser toe of right foot. She presented to the ED complaining of toe pain after she kicked a chair on accident***, right fourth toe. Toe X ray: Minimally displaced fracture involving the distal metaphysis of the fourth proximal phalanx.   DEXA on 07/19/22:Assessment: By the [facility] Criteria for diagnosis based on bone density, this patient has Low Bone Density       FRAX 10-year fracture risk calculator: 5.6 % for any major fracture and 0.4 % for hip fracture. Pharmacologic therapy is recommended if 10 year fracture risk is >20% for any major osteoporotic fracture or >3% for hip fracture.   Review of Systems See other pertinent positives and negatives in HPI.  Current Outpatient Medications on File Prior to Visit  Medication Sig Dispense Refill   buPROPion (WELLBUTRIN XL) 150 MG 24 hr tablet Take 150 mg by mouth daily.     escitalopram (LEXAPRO) 20 MG tablet Take 20 mg by mouth daily.     FOLIC ACID PO Take by mouth daily.     hydroxychloroquine (PLAQUENIL) 200 MG tablet Take 200 mg by mouth 2 (two) times daily.     levothyroxine (SYNTHROID) 88 MCG tablet TAKE 1 TABLET BY MOUTH EVERY DAY. (STOP THE 125 MCG DOSE) 90 tablet 1   omeprazole (PRILOSEC) 20 MG capsule TAKE 1 CAPSULE BY MOUTH EVERY DAY 30 capsule 5   risperiDONE (RISPERDAL) 1 MG tablet Take 1 mg by mouth at bedtime.  1   urea (CARMOL) 40 % CREA Apply 1 Application topically daily. 198 g 1   Vitamin D, Ergocalciferol, (DRISDOL) 1.25 MG (50000 UNIT) CAPS capsule TAKE 1 CAPSULE BY MOUTH EVERY 14 (FOURTEEN) DAYS. 6 capsule 2   No current facility-administered medications on file prior to visit.    Past Medical History:  Diagnosis Date   Anemia    IDA-not taking meds at this time due to insurance   Barrett esophagus    Depression    hx of    DUB (dysfunctional uterine bleeding)    GERD (gastroesophageal reflux disease)    on meds   Graves disease    Hypertension    on meds   Hypothyroidism    on meds   Allergies  Allergen Reactions   Milk-Related Compounds Other (See Comments)    Intolerance to milk products-causes gas    Social History   Socioeconomic History   Marital status: Married    Spouse name: Not on file   Number of children: 0   Years of education: Not on file   Highest education level: Not on file  Occupational History    Employer: [employer]  Tobacco Use   Smoking status: Former    Current packs/day: 0.00    Average packs/day: 1 pack/day for 20.3 years (20.3 ttl pk-yrs)    Types: Cigarettes    Start date: 03/13/1967    Quit date: 07/14/1987    Years since quitting: 35.1   Smokeless tobacco: Never   Tobacco comments:    quit 26 years ago  Vaping Use   Vaping status: Never Used  Substance and Sexual Activity   Alcohol use: No    Alcohol/week: 0.0 standard drinks of alcohol   Drug use: No   Sexual activity: Not on file  Other Topics Concern   Not on file  Social  History Narrative   Occupation: Estate agent   Regular exercise- yes   0 caffeine drinks    Social Determinants of Corporate investment banker Strain: Not on file  Food Insecurity: Not on file  Transportation Needs: Not on file  Physical Activity: Not on file  Stress: Not on file  Social Connections: Unknown (06/19/2021)   Received from [facility]   Social Network    Social Network: Not on file    Vitals:   08/30/22 1145  BP: 120/80  Pulse: 60  Resp: 16  Temp: 98.3 F (36.8 C)  SpO2: 99%   Body mass index is 27.06 kg/m.  Physical Exam Vitals and nursing note reviewed.  HENT:     Head: Normocephalic and atraumatic.  Eyes:     Conjunctiva/sclera: Conjunctivae normal.  Cardiovascular:     Rate and Rhythm: Normal rate and regular rhythm.     Pulses:          Dorsalis pedis pulses are 2+ on the  right side.       Posterior tibial pulses are 2+ on the right side.     Heart sounds: No murmur heard. Pulmonary:     Effort: Pulmonary effort is normal. No respiratory distress.     Breath sounds: Normal breath sounds.  Musculoskeletal:     Right lower leg: No edema.     Left lower leg: No edema.     Right foot: Normal capillary refill. No tenderness or bony tenderness. Normal pulse.  Neurological:     General: No focal deficit present.     Mental Status: She is alert and oriented to person, place, and time.     Gait: Gait normal.  Psychiatric:        Mood and Affect: Mood and affect normal.     ASSESSMENT AND PLAN:  Karen [name] was seen today for follow-up.  Diagnoses and all orders for this visit:  Closed displaced fracture of distal phalanx of lesser toe of right foot with routine healing, subsequent encounter -     DG Toe 4th Right; Future  Stage 3a chronic kidney disease (HCC)  Connective tissue disease (HCC)    Orders Placed This Encounter  Procedures   DG Toe 4th Right    No problem-specific Assessment & Plan notes found for this encounter.   Return if symptoms worsen or fail to improve, for keep next appointment.  Eris Breck G. [place], MD  [facility]. [place] office.

## 2022-08-30 NOTE — Patient Instructions (Addendum)
A few things to remember from today's visit:  Closed displaced fracture of distal phalanx of lesser toe of right foot with routine healing, subsequent encounter - Plan: DG Toe 4th Right Continue wearing hard shoe. X-ray to be repeated around the 2-3rd  week of August.  In regards to blood work recommended by your psychiatrist, I think we can wait until your next appointment to check liver, blood cells, and blood sugar.  You had blood work done 01/23/2022 and 07/25/2022.  If you need refills for medications you take chronically, please call your pharmacy. Do not use My Chart to request refills or for acute issues that need immediate attention. If you send a my chart message, it may take a few days to be addressed, specially if I am not in the office.  Please be sure medication list is accurate. If a new problem present, please set up appointment sooner than planned today.

## 2022-08-31 ENCOUNTER — Encounter: Payer: Self-pay | Admitting: Family

## 2022-09-23 ENCOUNTER — Other Ambulatory Visit: Payer: 59

## 2022-09-23 ENCOUNTER — Ambulatory Visit (INDEPENDENT_AMBULATORY_CARE_PROVIDER_SITE_OTHER): Payer: BC Managed Care – PPO

## 2022-09-23 DIAGNOSIS — S92531D Displaced fracture of distal phalanx of right lesser toe(s), subsequent encounter for fracture with routine healing: Secondary | ICD-10-CM

## 2022-10-10 ENCOUNTER — Encounter: Payer: Self-pay | Admitting: Family

## 2022-10-22 ENCOUNTER — Encounter: Payer: Self-pay | Admitting: Family

## 2022-10-29 ENCOUNTER — Other Ambulatory Visit: Payer: Self-pay | Admitting: Family Medicine

## 2022-10-29 DIAGNOSIS — Z1231 Encounter for screening mammogram for malignant neoplasm of breast: Secondary | ICD-10-CM

## 2022-10-30 ENCOUNTER — Telehealth: Payer: Self-pay

## 2022-10-30 DIAGNOSIS — S92531D Displaced fracture of distal phalanx of right lesser toe(s), subsequent encounter for fracture with routine healing: Secondary | ICD-10-CM

## 2022-10-30 NOTE — Telephone Encounter (Signed)
-----   Message from [facility] Kien Mirsky A sent at 10/02/2022  4:26 PM EDT ----- Repeat toe x-ray

## 2022-10-30 NOTE — Telephone Encounter (Signed)
I left patient a voicemail letting her know it is time to schedule a repeat toe x-ray. Order is in.

## 2022-11-12 ENCOUNTER — Inpatient Hospital Stay (HOSPITAL_BASED_OUTPATIENT_CLINIC_OR_DEPARTMENT_OTHER): Payer: BC Managed Care – PPO | Admitting: Hematology and Oncology

## 2022-11-12 ENCOUNTER — Inpatient Hospital Stay: Payer: BC Managed Care – PPO | Attending: Hematology and Oncology

## 2022-11-12 VITALS — BP 127/66 | HR 65 | Temp 97.4°F | Resp 18 | Ht 69.0 in | Wt 185.7 lb

## 2022-11-12 DIAGNOSIS — D631 Anemia in chronic kidney disease: Secondary | ICD-10-CM | POA: Diagnosis not present

## 2022-11-12 DIAGNOSIS — N189 Chronic kidney disease, unspecified: Secondary | ICD-10-CM | POA: Insufficient documentation

## 2022-11-12 LAB — CBC WITH DIFFERENTIAL (CANCER CENTER ONLY)
Abs Immature Granulocytes: 0 10*3/uL (ref 0.00–0.07)
Basophils Absolute: 0 10*3/uL (ref 0.0–0.1)
Basophils Relative: 1 %
Eosinophils Absolute: 0.1 10*3/uL (ref 0.0–0.5)
Eosinophils Relative: 2 %
HCT: 29.2 % — ABNORMAL LOW (ref 36.0–46.0)
Hemoglobin: 9.2 g/dL — ABNORMAL LOW (ref 12.0–15.0)
Immature Granulocytes: 0 %
Lymphocytes Relative: 39 %
Lymphs Abs: 1.1 10*3/uL (ref 0.7–4.0)
MCH: 29.6 pg (ref 26.0–34.0)
MCHC: 31.5 g/dL (ref 30.0–36.0)
MCV: 93.9 fL (ref 80.0–100.0)
Monocytes Absolute: 0.4 10*3/uL (ref 0.1–1.0)
Monocytes Relative: 13 %
Neutro Abs: 1.3 10*3/uL — ABNORMAL LOW (ref 1.7–7.7)
Neutrophils Relative %: 45 %
Platelet Count: 183 10*3/uL (ref 150–400)
RBC: 3.11 MIL/uL — ABNORMAL LOW (ref 3.87–5.11)
RDW: 13.2 % (ref 11.5–15.5)
WBC Count: 2.8 10*3/uL — ABNORMAL LOW (ref 4.0–10.5)
nRBC: 0 % (ref 0.0–0.2)

## 2022-11-12 LAB — CMP (CANCER CENTER ONLY)
ALT: 30 U/L (ref 0–44)
AST: 39 U/L (ref 15–41)
Albumin: 4 g/dL (ref 3.5–5.0)
Alkaline Phosphatase: 60 U/L (ref 38–126)
Anion gap: 4 — ABNORMAL LOW (ref 5–15)
BUN: 17 mg/dL (ref 8–23)
CO2: 27 mmol/L (ref 22–32)
Calcium: 9 mg/dL (ref 8.9–10.3)
Chloride: 109 mmol/L (ref 98–111)
Creatinine: 1.64 mg/dL — ABNORMAL HIGH (ref 0.44–1.00)
GFR, Estimated: 35 mL/min — ABNORMAL LOW (ref >60)
Glucose, Bld: 82 mg/dL (ref 70–99)
Potassium: 4 mmol/L (ref 3.5–5.1)
Sodium: 140 mmol/L (ref 135–145)
Total Bilirubin: 0.6 mg/dL (ref 0.3–1.2)
Total Protein: 6.7 g/dL (ref 6.5–8.1)

## 2022-11-12 NOTE — Assessment & Plan Note (Signed)
Previous iron infusion: June 2018 ESA therapy: Previously received Aranesp 2-3 times per year from 2014 (with Dr. Myna Hidalgo) Current treatment: Retacrit 20,000 units monthly started 10/02/2018 (2 doses received so far) goal: Hemoglobin of 10 or above Last Retacrit 10/29/2019   Bone marrow biopsy performed because of anemia and leukopenia: Normocellular bone marrow with trilineage hematopoiesis with mild megakaryocytic atypia   Lab review:   07/21/2019: Hemoglobin 9.2, WBC was 5.2 and platelet count 203 09/01/2019: Hb 9, WBC 2.2 ANC 1.2 12/31/2019: Hemoglobin 9.1 (Retacrit was not given because of insurance issues) 02/17/2020: Hemoglobin 9.3 05/17/20: Hemoglobin 10.1, ANC 1.3 11/08/2020: Hemoglobin 9.2 11/08/2021: Hemoglobin 9.7 05/13/2022: Hemoglobin 9.3   Patient is going through treatment for lupus through [facility].  (Hydroxychloroquine and prednisone 1 mg)

## 2022-11-12 NOTE — Progress Notes (Signed)
Patient Care Team: [place], [name], MD as PCP - General (Family Medicine)  DIAGNOSIS:  Encounter Diagnosis  Name Primary?   Anemia of renal disease Yes   CHIEF COMPLIANT: Follow-up of anemia of chronic kidney disease and chronic inflammation    History of Present Illness   The patient, with a history of lupus, presents for a follow-up visit. She is currently on hydroxychloroquine, which has been associated with a decrease in her white blood cell count. The patient's hemoglobin remains low at 9.2. She reports that her lupus symptoms have not improved with hydroxychloroquine.  The patient also has a history of receiving iron infusions for anemia, but this was a long time ago. She reports that she is not feeling overly tired and has been able to work without difficulty. She is due for a follow-up with her specialist in November.       ALLERGIES:  is allergic to milk-related compounds.  MEDICATIONS:  Current Outpatient Medications  Medication Sig Dispense Refill   buPROPion (WELLBUTRIN XL) 150 MG 24 hr tablet Take 150 mg by mouth daily.     escitalopram (LEXAPRO) 20 MG tablet Take 20 mg by mouth daily.     FOLIC ACID PO Take by mouth daily.     hydroxychloroquine (PLAQUENIL) 200 MG tablet Take 200 mg by mouth 2 (two) times daily.     levothyroxine (SYNTHROID) 88 MCG tablet TAKE 1 TABLET BY MOUTH EVERY DAY. (STOP THE 125 MCG DOSE) 90 tablet 1   omeprazole (PRILOSEC) 20 MG capsule TAKE 1 CAPSULE BY MOUTH EVERY DAY 30 capsule 5   risperiDONE (RISPERDAL) 1 MG tablet Take 1 mg by mouth at bedtime.  1   urea (CARMOL) 40 % CREA Apply 1 Application topically daily. 198 g 1   Vitamin D, Ergocalciferol, (DRISDOL) 1.25 MG (50000 UNIT) CAPS capsule TAKE 1 CAPSULE BY MOUTH EVERY 14 (FOURTEEN) DAYS. 6 capsule 2   No current facility-administered medications for this visit.    PHYSICAL EXAMINATION: ECOG PERFORMANCE STATUS: 1 - Symptomatic but completely ambulatory  Vitals:   11/12/22 1450   BP: 127/66  Pulse: 65  Resp: 18  Temp: (!) 97.4 F (36.3 C)  SpO2: 100%   Filed Weights   11/12/22 1450  Weight: 185 lb 11.2 oz (84.2 kg)     LABORATORY DATA:  I have reviewed the data as listed    Latest Ref Rng & Units 11/12/2022    2:38 PM 01/30/2022   11:56 AM 11/07/2021    2:24 PM  CMP  Glucose 70 - 99 mg/dL 82  85  93   BUN 8 - 23 mg/dL 17  20  27    Creatinine 0.44 - 1.00 mg/dL 2.95  6.21  3.08   Sodium 135 - 145 mmol/L 140  141  138   Potassium 3.5 - 5.1 mmol/L 4.0  4.4  4.7   Chloride 98 - 111 mmol/L 109  106  109   CO2 22 - 32 mmol/L 27  29  23    Calcium 8.9 - 10.3 mg/dL 9.0  9.1  9.7   Total Protein 6.5 - 8.1 g/dL 6.7  6.7  7.0   Total Bilirubin 0.3 - 1.2 mg/dL 0.6  0.6  1.0   Alkaline Phos 38 - 126 U/L 60  42  55   AST 15 - 41 U/L 39  21  56   ALT 0 - 44 U/L 30  14  32     Lab Results  Component  Value Date   WBC 2.8 (L) 11/12/2022   HGB 9.2 (L) 11/12/2022   HCT 29.2 (L) 11/12/2022   MCV 93.9 11/12/2022   PLT 183 11/12/2022   NEUTROABS 1.3 (L) 11/12/2022    ASSESSMENT & PLAN:  Anemia of renal disease Previous iron infusion: June 2018 ESA therapy: Previously received Aranesp 2-3 times per year from 2014 (with Dr. Myna Hidalgo) Current treatment: Retacrit 20,000 units monthly started 10/02/2018 (2 doses received so far) goal: Hemoglobin of 10 or above Last Retacrit 10/29/2019   Bone marrow biopsy performed because of anemia and leukopenia: Normocellular bone marrow with trilineage hematopoiesis with mild megakaryocytic atypia   Lab review:   07/21/2019: Hemoglobin 9.2, WBC was 5.2 and platelet count 203 09/01/2019: Hb 9, WBC 2.2 ANC 1.2 12/31/2019: Hemoglobin 9.1 (Retacrit was not given because of insurance issues) 02/17/2020: Hemoglobin 9.3 05/17/20: Hemoglobin 10.1, ANC 1.3 11/08/2020: Hemoglobin 9.2 11/08/2021: Hemoglobin 9.7 05/13/2022: Hemoglobin 9.3 11/12/2022: Hemoglobin 9.2, ANC 1.3, WBC 2.8   Patient is going through treatment for lupus through [facility].  (Hydroxychloroquine)    Follow-up in 6 months to reassess labs and overall health status.        Orders Placed This Encounter  Procedures   Ferritin    Standing Status:   Future    Standing Expiration Date:   11/12/2023   Iron and Iron Binding Capacity ([facility] only)    Standing Status:   Future    Standing Expiration Date:   11/12/2023   CBC with Differential ([facility] Center Only)    Standing Status:   Future    Standing Expiration Date:   11/12/2023   The patient has a good understanding of the overall plan. she agrees with it. she will call with any problems that may develop before the next visit here. Total time spent: 30 mins including face to face time and time spent for planning, charting and co-ordination of care   Tamsen Meek, MD 11/12/22

## 2022-11-13 ENCOUNTER — Telehealth: Payer: Self-pay | Admitting: Hematology and Oncology

## 2022-11-13 NOTE — Telephone Encounter (Signed)
Per Pamelia Hoit 10/8 los patient is aware of scheduled appointment times/dates

## 2022-11-22 ENCOUNTER — Ambulatory Visit
Admission: RE | Admit: 2022-11-22 | Discharge: 2022-11-22 | Disposition: A | Discharge status: Home / Self Care | Payer: BC Managed Care – PPO | Source: Ambulatory Visit | Attending: Family Medicine | Admitting: Family Medicine

## 2022-11-22 ENCOUNTER — Encounter: Payer: Self-pay | Admitting: Family

## 2022-11-22 DIAGNOSIS — Z1231 Encounter for screening mammogram for malignant neoplasm of breast: Secondary | ICD-10-CM

## 2022-12-08 ENCOUNTER — Other Ambulatory Visit: Payer: Self-pay | Admitting: Family Medicine

## 2022-12-08 DIAGNOSIS — E039 Hypothyroidism, unspecified: Secondary | ICD-10-CM

## 2023-02-03 ENCOUNTER — Encounter: Payer: Self-pay | Admitting: Family

## 2023-02-03 ENCOUNTER — Encounter: Payer: Self-pay | Admitting: Family Medicine

## 2023-02-03 ENCOUNTER — Ambulatory Visit (INDEPENDENT_AMBULATORY_CARE_PROVIDER_SITE_OTHER): Payer: BC Managed Care – PPO | Admitting: Family Medicine

## 2023-02-03 ENCOUNTER — Other Ambulatory Visit (HOSPITAL_COMMUNITY)
Admission: RE | Admit: 2023-02-03 | Discharge: 2023-02-03 | Disposition: A | Discharge status: Home / Self Care | Payer: BC Managed Care – PPO | Source: Ambulatory Visit | Attending: Family Medicine | Admitting: Family Medicine

## 2023-02-03 VITALS — BP 110/78 | HR 55 | Temp 98.2°F | Resp 16 | Ht 70.0 in | Wt 184.2 lb

## 2023-02-03 DIAGNOSIS — Z0000 Encounter for general adult medical examination without abnormal findings: Secondary | ICD-10-CM

## 2023-02-03 DIAGNOSIS — E039 Hypothyroidism, unspecified: Secondary | ICD-10-CM | POA: Diagnosis not present

## 2023-02-03 DIAGNOSIS — I10 Essential (primary) hypertension: Secondary | ICD-10-CM | POA: Diagnosis not present

## 2023-02-03 DIAGNOSIS — Z124 Encounter for screening for malignant neoplasm of cervix: Secondary | ICD-10-CM

## 2023-02-03 DIAGNOSIS — N1831 Chronic kidney disease, stage 3a: Secondary | ICD-10-CM | POA: Diagnosis not present

## 2023-02-03 DIAGNOSIS — D631 Anemia in chronic kidney disease: Secondary | ICD-10-CM

## 2023-02-03 DIAGNOSIS — E785 Hyperlipidemia, unspecified: Secondary | ICD-10-CM

## 2023-02-03 DIAGNOSIS — N189 Chronic kidney disease, unspecified: Secondary | ICD-10-CM

## 2023-02-03 DIAGNOSIS — E559 Vitamin D deficiency, unspecified: Secondary | ICD-10-CM | POA: Diagnosis not present

## 2023-02-03 LAB — LIPID PANEL
Cholesterol: 198 mg/dL (ref 0–200)
HDL: 66.1 mg/dL (ref >39.00)
LDL Cholesterol: 119 mg/dL — ABNORMAL HIGH (ref 0–99)
NonHDL: 131.58
Total CHOL/HDL Ratio: 3
Triglycerides: 63 mg/dL (ref 0.0–149.0)
VLDL: 12.6 mg/dL (ref 0.0–40.0)

## 2023-02-03 LAB — BASIC METABOLIC PANEL
BUN: 22 mg/dL (ref 6–23)
CO2: 28 meq/L (ref 19–32)
Calcium: 9.3 mg/dL (ref 8.4–10.5)
Chloride: 106 meq/L (ref 96–112)
Creatinine, Ser: 1.06 mg/dL (ref 0.40–1.20)
GFR: 55.83 mL/min — ABNORMAL LOW (ref >60.00)
Glucose, Bld: 90 mg/dL (ref 70–99)
Potassium: 4.1 meq/L (ref 3.5–5.1)
Sodium: 141 meq/L (ref 135–145)

## 2023-02-03 LAB — MICROALBUMIN / CREATININE URINE RATIO
Creatinine,U: 90.7 mg/dL
Microalb Creat Ratio: 0.8 mg/g (ref 0.0–30.0)
Microalb, Ur: <0.7 mg/dL (ref 0.0–1.9)

## 2023-02-03 LAB — T4, FREE: Free T4: 0.79 ng/dL (ref 0.60–1.60)

## 2023-02-03 LAB — TSH: TSH: 6.18 u[IU]/mL — ABNORMAL HIGH (ref 0.35–5.50)

## 2023-02-03 LAB — VITAMIN D 25 HYDROXY (VIT D DEFICIENCY, FRACTURES): VITD: 34.2 ng/mL (ref 30.00–100.00)

## 2023-02-03 NOTE — Assessment & Plan Note (Signed)
Problem has been well controlled, last TSH was 1.3 in 01/2022. Continue levothyroxine 88 mcg daily. Further recommendations according to TSH result.

## 2023-02-03 NOTE — Progress Notes (Signed)
HPI: [name] is a 62 y.o. female with a PMHx significant for iron deficiency anemia, CKD III, lupus, depression, vitamin D deficiency, hypothyroidism, and HLD, who is here today for her routine physical.  Last CPE: 01/30/2022   Exercise: She is not exercising regularly but states she is active at work.  Diet: She is eating healthy in general. She is eating vegetables daily, and eats a combination of chicken, fish, and red meats.  Sleep: 5 hours per night.  Alcohol Use: none Smoking: She quit smoking in 1989.  Vision: UTD on routine vision care. Dental: UTD on routine dental care.  She does not follow regularly with a gynecologist. Her last pap smear was in 11/2017.   Immunization History  Administered Date(s) Administered   Influenza Split 11/06/2010, 12/18/2011   Influenza Whole 10/22/2007, 10/28/2008, 11/02/2009   Influenza,inj,Quad PF,6+ Mos 11/25/2012, 12/15/2013, 10/11/2015, 11/22/2016, 11/24/2017, 11/24/2018, 11/22/2019, 12/27/2020   PFIZER(Purple Top)SARS-COV-2 Vaccination 04/03/2019, 04/24/2019, 11/26/2019, 05/27/2020   PPD Test 08/22/2014   Pfizer Covid-19 Vaccine Bivalent Booster 75yrs & up 10/28/2020   Pfizer(Comirnaty)Fall Seasonal Vaccine 12 years and older 10/27/2022   Pneumococcal Polysaccharide-23 11/22/2016   Td 02/04/1998, 10/28/2008   Tdap 11/24/2018   Zoster Recombinant(Shingrix) 11/24/2017, 01/23/2018, 11/22/2019   Health Maintenance  Topic Date Due   Cervical Cancer Screening (HPV/Pap Cotest)  11/25/2022   Colonoscopy  03/11/2023   MAMMOGRAM  11/21/2024   DTaP/Tdap/Td (4 - Td or Tdap) 11/23/2028   INFLUENZA VACCINE  Completed   COVID-19 Vaccine  Completed   Hepatitis C Screening  Completed   HIV Screening  Completed   Zoster Vaccines- Shingrix  Completed   HPV VACCINES  Aged Out   11/24/2017  Cytology - PAP ([place])  ADEQUACY: Satisfactory for evaluation  endocervical/transformation zone component PRESENT. DIAGNOSIS: NEGATIVE FOR  INTRAEPITHELIAL LESIONS OR MALIGNANCY. HPV [facility]): NOT DETECTED MATERIAL SUBMITTED: CervicoVaginal Pap [ThinPrep Imaged]  G0 M?  DEXA on 07/19/22:Assessment: By the [facility] Criteria for diagnosis based on bone density, this patient has Low Bone Density .    FRAX 10-year fracture risk calculator: 5.6 % for any major fracture and 0.4 % for hip fracture.  Chronic medical problems:   Dyslipidemia:She is on non pharmacologic treatment.  Lab Results  Component Value Date   CHOL 167 01/30/2022   HDL 64.80 01/30/2022   LDLCALC 92 01/30/2022   LDLDIRECT 148.9 10/25/2010   TRIG 51.0 01/30/2022   CHOLHDL 3 01/30/2022    Lupus:  She is no longer on hydroxychloroquine. She follows with rheumatology annually.   Anxiety/depression:  Currently on bupropion 150 mg daily and risperidone 1 mg daily. She is no longer taking Lexapro.  She follows with psychiatry Ellis Savage).  She would like FLP to be faxed to her psychiatrist.  Hypothyroidism:  Currently on levothyroxine 88 mcg daily.  Lab Results  Component Value Date   TSH 1.34 01/30/2022   Vitamin D deficiency:  She takes Ergocalciferol 50000 units every two weeks.   Lab Results  Component Value Date   VD25OH 36.89 01/30/2022  She is not taking calcium supplementation.   CKD III: Negative for gross hematuria or foam in urine. HTN on nonpharmacologic treatment.  Lab Results  Component Value Date   NA 140 11/12/2022   CL 109 11/12/2022   K 4.0 11/12/2022   CO2 27 11/12/2022   BUN 17 11/12/2022   CREATININE 1.64 (H) 11/12/2022   GFRNONAA 35 (L) 11/12/2022   CALCIUM 9.0 11/12/2022   ALBUMIN 4.0 11/12/2022   GLUCOSE  82 11/12/2022   Chronic anemia: Follows with Dr Pamelia Hoit q 6 months. She has received iron infusions in the past as well a Aranesp.  Currently she is on Retacrit 20,000 units monthly. Per records: Bone marrow biopsy showed normocellular bone marrow with trilineage hematopoiesis with mild megakaryocytic atypia   Lab Results  Component Value Date   WBC 2.8 (L) 11/12/2022   HGB 9.2 (L) 11/12/2022   HCT 29.2 (L) 11/12/2022   MCV 93.9 11/12/2022   PLT 183 11/12/2022   Closed fracture of distal phalanx of lesser toe of right foot:Dx'ed in 08/2022.  She says her toe is feeling better and she is no longer having pain.   Review of Systems  Constitutional:  Negative for activity change, appetite change, chills and fever.  HENT:  Negative for hearing loss, mouth sores, sore throat and trouble swallowing.   Eyes:  Negative for redness and visual disturbance.  Respiratory:  Negative for cough, shortness of breath and wheezing.   Cardiovascular:  Negative for chest pain and leg swelling.  Gastrointestinal:  Negative for abdominal pain, nausea and vomiting.       No changes in bowel habits.  Endocrine: Negative for cold intolerance, heat intolerance, polydipsia, polyphagia and polyuria.  Genitourinary:  Negative for decreased urine volume, dysuria, vaginal bleeding and vaginal discharge.  Musculoskeletal:  Negative for gait problem and myalgias.  Skin:  Negative for color change and rash.  Allergic/Immunologic: Positive for environmental allergies.  Neurological:  Negative for syncope, weakness and headaches.  Hematological:  Negative for adenopathy. Does not bruise/bleed easily.  Psychiatric/Behavioral:  Negative for confusion and hallucinations.   All other systems reviewed and are negative.  Current Outpatient Medications on File Prior to Visit  Medication Sig Dispense Refill   buPROPion (WELLBUTRIN XL) 150 MG 24 hr tablet Take 150 mg by mouth daily.     FOLIC ACID PO Take by mouth daily.     levothyroxine (SYNTHROID) 88 MCG tablet TAKE 1 TABLET BY MOUTH EVERY DAY *STOP 90 tablet 1   omeprazole (PRILOSEC) 20 MG capsule TAKE 1 CAPSULE BY MOUTH EVERY DAY 90 capsule 1   risperiDONE (RISPERDAL) 1 MG tablet Take 1 mg by mouth at bedtime.  1   Vitamin D, Ergocalciferol, (DRISDOL) 1.25 MG (50000  UNIT) CAPS capsule TAKE 1 CAPSULE BY MOUTH EVERY 14 (FOURTEEN) DAYS. 6 capsule 2   No current facility-administered medications on file prior to visit.   Past Medical History:  Diagnosis Date   Anemia    IDA-not taking meds at this time due to insurance   Barrett esophagus    Depression    hx of   DUB (dysfunctional uterine bleeding)    GERD (gastroesophageal reflux disease)    on meds   Graves disease    Hypertension    on meds   Hypothyroidism    on meds    Past Surgical History:  Procedure Laterality Date   bunionectomy Left    CHOLECYSTECTOMY N/A 07/16/2012   Procedure: LAPAROSCOPIC CHOLECYSTECTOMY WITH INTRAOPERATIVE CHOLANGIOGRAM;  Surgeon: Clovis Pu. Cornett, MD;  Location: [facility] OR;  Service: General;  Laterality: N/A;   COLONOSCOPY  2011   MS-F/V-moveiprep(food)-polypoid   UPPER GASTROINTESTINAL ENDOSCOPY  2019   MS-MAC-recall 3 yrs    Allergies  Allergen Reactions   Milk-Related Compounds Other (See Comments)    Intolerance to milk products-causes gas    Family History  Problem Relation Age of Onset   Hypertension Mother    Asthma Mother  Cancer Father 68       larynx cancer    Breast cancer Maternal Aunt 60   Colon cancer Neg Hx    Colon polyps Neg Hx    Esophageal cancer Neg Hx    Rectal cancer Neg Hx    Stomach cancer Neg Hx     Social History   Socioeconomic History   Marital status: Married    Spouse name: Not on file   Number of children: 0   Years of education: Not on file   Highest education level: Not on file  Occupational History    Employer: [employer]  Tobacco Use   Smoking status: Former    Current packs/day: 0.00    Average packs/day: 1 pack/day for 20.3 years (20.3 ttl pk-yrs)    Types: Cigarettes    Start date: 03/13/1967    Quit date: 07/14/1987    Years since quitting: 35.5   Smokeless tobacco: Never   Tobacco comments:    quit 26 years ago  Vaping Use   Vaping status: Never Used  Substance and Sexual Activity   Alcohol use:  No    Alcohol/week: 0.0 standard drinks of alcohol   Drug use: No   Sexual activity: Not on file  Other Topics Concern   Not on file  Social History Narrative   Occupation: Estate agent   Regular exercise- yes   0 caffeine drinks    Social Drivers of Corporate investment banker Strain: Not on file  Food Insecurity: Not on file  Transportation Needs: Not on file  Physical Activity: Not on file  Stress: Not on file  Social Connections: Unknown (06/19/2021)   [facility]   Social Network    Social Network: Not on file   Vitals:   02/03/23 0915  BP: 110/78  Pulse: (!) 55  Resp: 16  Temp: 98.2 F (36.8 C)  SpO2: 99%   Body mass index is 26.43 kg/m.  Wt Readings from Last 3 Encounters:  02/03/23 184 lb 3.2 oz (83.6 kg)  11/12/22 185 lb 11.2 oz (84.2 kg)  08/30/22 183 lb 4 oz (83.1 kg)   Physical Exam Vitals and nursing note reviewed. Exam conducted with a chaperone present.  Constitutional:      General: She is not in acute distress.    Appearance: She is well-developed.  HENT:     Head: Normocephalic and atraumatic.     Right Ear: Tympanic membrane, ear canal and external ear normal.     Left Ear: External ear normal.     Ears:     Comments: Left ear canal cerumen excess, not able to see TM    Mouth/Throat:     Mouth: Mucous membranes are moist.     Pharynx: Oropharynx is clear. Uvula midline.  Eyes:     Extraocular Movements: Extraocular movements intact.     Conjunctiva/sclera: Conjunctivae normal.     Pupils: Pupils are equal, round, and reactive to light.  Neck:     Thyroid: No thyroid mass or thyromegaly.  Cardiovascular:     Rate and Rhythm: Regular rhythm. Bradycardia present.     Pulses:          Dorsalis pedis pulses are 2+ on the right side and 2+ on the left side.     Heart sounds: No murmur heard. Pulmonary:     Effort: Pulmonary effort is normal. No respiratory distress.     Breath sounds: Normal breath  sounds.  Abdominal:     Palpations: Abdomen is soft. There is no hepatomegaly or mass.     Tenderness: There is no abdominal tenderness.  Genitourinary:    Exam position: Lithotomy position.     Labia:        Right: No rash, tenderness or lesion.        Left: No rash, tenderness or lesion.      Vagina: No signs of injury and foreign body. No vaginal discharge, erythema, tenderness, bleeding or lesions.     Cervix: No cervical motion tenderness, discharge, friability or erythema.     Uterus: Not enlarged and not tender.      Adnexa:        Right: No mass, tenderness or fullness.         Left: No mass, tenderness or fullness.       Comments: Small areas of telangiectasis around cervix. Pap smear collected. Musculoskeletal:     Right lower leg: No edema.     Left lower leg: No edema.     Comments: No signs of synovitis appreciated.  Lymphadenopathy:     Cervical: No cervical adenopathy.     Upper Body:     Right upper body: No supraclavicular adenopathy.     Left upper body: No supraclavicular adenopathy.  Skin:    General: Skin is warm.     Findings: No erythema or rash.  Neurological:     General: No focal deficit present.     Mental Status: She is alert and oriented to person, place, and time.     Cranial Nerves: No cranial nerve deficit.     Sensory: No sensory deficit.     Motor: No weakness.     Coordination: Coordination normal.     Gait: Gait normal.     Deep Tendon Reflexes:     Reflex Scores:      Bicep reflexes are 2+ on the right side and 2+ on the left side.      Patellar reflexes are 2+ on the right side and 2+ on the left side. Psychiatric:        Mood and Affect: Mood and affect normal.    ASSESSMENT AND PLAN:  Ms. Karen [name] was here today for her annual physical examination.  Orders Placed This Encounter  Procedures   Lipid panel   Basic metabolic panel   VITAMIN D 25 Hydroxy (Vit-D Deficiency, Fractures)   TSH   T4, free   Microalbumin /  creatinine urine ratio   Lab Results  Component Value Date   MICROALBUR <0.7 02/03/2023   MICROALBUR <0.7 01/30/2022   Lab Results  Component Value Date   NA 141 02/03/2023   CL 106 02/03/2023   K 4.1 02/03/2023   CO2 28 02/03/2023   BUN 22 02/03/2023   CREATININE 1.06 02/03/2023   GFR 55.83 (L) 02/03/2023   CALCIUM 9.3 02/03/2023   ALBUMIN 4.0 11/12/2022   GLUCOSE 90 02/03/2023   Lab Results  Component Value Date   VD25OH 34.20 02/03/2023   Lab Results  Component Value Date   TSH 6.18 (H) 02/03/2023   Lab Results  Component Value Date   CHOL 198 02/03/2023   HDL 66.10 02/03/2023   LDLCALC 119 (H) 02/03/2023   LDLDIRECT 148.9 10/25/2010   TRIG 63.0 02/03/2023   CHOLHDL 3 02/03/2023   Routine general medical examination at a health care facility Assessment & Plan: We discussed the importance of regular physical activity and healthy diet  for prevention of chronic illness and/or complications. Preventive guidelines reviewed. Vaccination up to date. Ca++ and vit D supplementation recommended. Pap smear collected today. Next CPE in a year.   Hypertension, essential, benign Assessment & Plan: BP adequately controlled. Currently she is on nonpharmacologic treatment, she was on Amlodipine in the past. Monitor BP at home regularly. As far as problem is well-controlled, annual follow-up is appropriate.  Orders: -     Basic metabolic panel; Future  Hypothyroidism, unspecified type Assessment & Plan: Problem has been well controlled, last TSH was 1.3 in 01/2022. Continue levothyroxine 88 mcg daily. Further recommendations according to TSH result.  Orders: -     TSH; Future -     T4, free; Future  Dyslipidemia (high LDL; low HDL) Assessment & Plan: Continue non pharmacologic treatment. Further recommendations will be given according to 10 years CVD risk score and lipid panel numbers.  Orders: -     Lipid panel; Future  Vitamin D deficiency,  unspecified Assessment & Plan: Continue ergocalciferol 50,000 units every 2 weeks. Further recommendation will be given according to 25 OH vitamin D result.  Orders: -     VITAMIN D 25 Hydroxy (Vit-D Deficiency, Fractures); Future  Stage 3a chronic kidney disease (HCC) Assessment & Plan: Problem has been stable, Cr 1.5-1.6 and e GFR 35. Continue adequate hydration, avoidance of NSAID's, and low-salt diet. She is not on ACEI or ARB.  Orders: -     Microalbumin / creatinine urine ratio; Future  Cervical cancer screening -     Cytology - PAP  Anemia of renal disease Assessment & Plan: Follows with hematologist , Dr Pamelia Hoit.   Return in 1 year (on 02/03/2024) for CPE, chronic problems, Labs.  I, Rolla Etienne Wierda, acting as a scribe for Domingue Coltrain [place], MD., have documented all relevant documentation on the behalf of Haseeb Fiallos [place], MD, as directed by  Dowell Hoon [place], MD while in the presence of Lasharn Bufkin [place], MD.   I, Oluwadara Gorman [place], MD, have reviewed all documentation for this visit. The documentation on 02/03/23 for the exam, diagnosis, procedures, and orders are all accurate and complete.  Jakub Debold G. [place], MD  [facility]. [place] office.

## 2023-02-03 NOTE — Assessment & Plan Note (Signed)
BP adequately controlled. Currently she is on nonpharmacologic treatment, she was on Amlodipine in the past. Monitor BP at home regularly. As far as problem is well-controlled, annual follow-up is appropriate.

## 2023-02-03 NOTE — Assessment & Plan Note (Signed)
Continue nonpharmacologic treatment. Further recommendations will be given according to 10 years CVD risk score and lipid panel numbers. 

## 2023-02-03 NOTE — Assessment & Plan Note (Signed)
Problem has been stable, Cr 1.5-1.6 and e GFR 35. Continue adequate hydration, avoidance of NSAID's, and low-salt diet. She is not on ACEI or ARB.

## 2023-02-03 NOTE — Assessment & Plan Note (Signed)
Follows with hematologist , Dr Pamelia Hoit.

## 2023-02-03 NOTE — Assessment & Plan Note (Signed)
We discussed the importance of regular physical activity and healthy diet for prevention of chronic illness and/or complications. Preventive guidelines reviewed. Vaccination up to date. Ca++ and vit D supplementation recommended. Pap smear collected today. Next CPE in a year.

## 2023-02-03 NOTE — Patient Instructions (Addendum)
A few things to remember from today's visit:  Routine general medical examination at a health care facility  Hypertension, essential, benign - Plan: Basic metabolic panel  Hypothyroidism, unspecified type - Plan: TSH, T4, free  Dyslipidemia (high LDL; low HDL) - Plan: Lipid panel  Vitamin D deficiency, unspecified - Plan: VITAMIN D 25 Hydroxy (Vit-D Deficiency, Fractures)  Stage 3a chronic kidney disease (HCC) - Plan: Microalbumin / creatinine urine ratio  Cervical cancer screening - Plan: PAP [[place]]  If you need refills for medications you take chronically, please call your pharmacy. Do not use My Chart to request refills or for acute issues that need immediate attention. If you send a my chart message, it may take a few days to be addressed, specially if I am not in the office.  Please be sure medication list is accurate. If a new problem present, please set up appointment sooner than planned today.  Health Maintenance, Female Adopting a healthy lifestyle and getting preventive care are important in promoting health and wellness. Ask your health care provider about: The right schedule for you to have regular tests and exams. Things you can do on your own to prevent diseases and keep yourself healthy. What should I know about diet, weight, and exercise? Eat a healthy diet  Eat a diet that includes plenty of vegetables, fruits, low-fat dairy products, and lean protein. Do not eat a lot of foods that are high in solid fats, added sugars, or sodium. Maintain a healthy weight Body mass index (BMI) is used to identify weight problems. It estimates body fat based on height and weight. Your health care provider can help determine your BMI and help you achieve or maintain a healthy weight. Get regular exercise Get regular exercise. This is one of the most important things you can do for your health. Most adults should: Exercise for at least 150 minutes each week. The exercise  should increase your heart rate and make you sweat (moderate-intensity exercise). Do strengthening exercises at least twice a week. This is in addition to the moderate-intensity exercise. Spend less time sitting. Even light physical activity can be beneficial. Watch cholesterol and blood lipids Have your blood tested for lipids and cholesterol at 63 years of age, then have this test every 5 years. Have your cholesterol levels checked more often if: Your lipid or cholesterol levels are high. You are older than 63 years of age. You are at high risk for heart disease. What should I know about cancer screening? Depending on your health history and family history, you may need to have cancer screening at various ages. This may include screening for: Breast cancer. Cervical cancer. Colorectal cancer. Skin cancer. Lung cancer. What should I know about heart disease, diabetes, and high blood pressure? Blood pressure and heart disease High blood pressure causes heart disease and increases the risk of stroke. This is more likely to develop in people who have high blood pressure readings or are overweight. Have your blood pressure checked: Every 3-5 years if you are 37-17 years of age. Every year if you are 63 years old or older. Diabetes Have regular diabetes screenings. This checks your fasting blood sugar level. Have the screening done: Once every three years after age 64 if you are at a normal weight and have a low risk for diabetes. More often and at a younger age if you are overweight or have a high risk for diabetes. What should I know about preventing infection? Hepatitis B If you  have a higher risk for hepatitis B, you should be screened for this virus. Talk with your health care provider to find out if you are at risk for hepatitis B infection. Hepatitis C Testing is recommended for: Everyone born from [birth year] through 1966. Anyone with known risk factors for hepatitis C. Sexually  transmitted infections (STIs) Get screened for STIs, including gonorrhea and chlamydia, if: You are sexually active and are younger than 63 years of age. You are older than 63 years of age and your health care provider tells you that you are at risk for this type of infection. Your sexual activity has changed since you were last screened, and you are at increased risk for chlamydia or gonorrhea. Ask your health care provider if you are at risk. Ask your health care provider about whether you are at high risk for HIV. Your health care provider may recommend a prescription medicine to help prevent HIV infection. If you choose to take medicine to prevent HIV, you should first get tested for HIV. You should then be tested every 3 months for as long as you are taking the medicine. Pregnancy If you are about to stop having your period (premenopausal) and you may become pregnant, seek counseling before you get pregnant. Take 400 to 800 micrograms (mcg) of folic acid every day if you become pregnant. Ask for birth control (contraception) if you want to prevent pregnancy. Osteoporosis and menopause Osteoporosis is a disease in which the bones lose minerals and strength with aging. This can result in bone fractures. If you are 29 years old or older, or if you are at risk for osteoporosis and fractures, ask your health care provider if you should: Be screened for bone loss. Take a calcium or vitamin D supplement to lower your risk of fractures. Be given hormone replacement therapy (HRT) to treat symptoms of menopause. Follow these instructions at home: Alcohol use Do not drink alcohol if: Your health care provider tells you not to drink. You are pregnant, may be pregnant, or are planning to become pregnant. If you drink alcohol: Limit how much you have to: 0-1 drink a day. Know how much alcohol is in your drink. In the [place] one drink equals one 12 oz bottle of beer (355 mL), one 5 oz glass of wine (148  mL), or one 1 oz glass of hard liquor (44 mL). Lifestyle Do not use any products that contain nicotine or tobacco. These products include cigarettes, chewing tobacco, and vaping devices, such as e-cigarettes. If you need help quitting, ask your health care provider. Do not use street drugs. Do not share needles. Ask your health care provider for help if you need support or information about quitting drugs. General instructions Schedule regular health, dental, and eye exams. Stay current with your vaccines. Tell your health care provider if: You often feel depressed. You have ever been abused or do not feel safe at home. Summary Adopting a healthy lifestyle and getting preventive care are important in promoting health and wellness. Follow your health care provider's instructions about healthy diet, exercising, and getting tested or screened for diseases. Follow your health care provider's instructions on monitoring your cholesterol and blood pressure. This information is not intended to replace advice given to you by your health care provider. Make sure you discuss any questions you have with your health care provider. Document Revised: 06/12/2020 Document Reviewed: 06/12/2020 Elsevier Patient Education  2024 [employer].

## 2023-02-03 NOTE — Assessment & Plan Note (Signed)
Continue ergocalciferol 50,000 units every 2 weeks. Further recommendation will be given according to 25 OH vitamin D result. 

## 2023-02-06 LAB — CYTOLOGY - PAP
Comment: NEGATIVE
Diagnosis: NEGATIVE
High risk HPV: NEGATIVE

## 2023-02-07 ENCOUNTER — Telehealth: Payer: Self-pay | Admitting: *Deleted

## 2023-02-07 NOTE — Telephone Encounter (Signed)
 Copied from CRM [id number]. Topic: Clinical - Lab/Test Results >> Feb 07, 2023 11:44 AM Cherylynn B wrote: Reason for CRM: Patient called Leah back after missing call to discuss lab results. Successfully relayed lab results message to patient via chart review. Patient stated she would call back to schedule the 2-3 months TSH lab recheck, as she needs her school schedule first to base scheduling around that.

## 2023-04-24 ENCOUNTER — Encounter: Payer: Self-pay | Admitting: Family

## 2023-04-24 ENCOUNTER — Encounter: Payer: Self-pay | Admitting: Internal Medicine

## 2023-04-24 ENCOUNTER — Ambulatory Visit (INDEPENDENT_AMBULATORY_CARE_PROVIDER_SITE_OTHER): Admitting: Internal Medicine

## 2023-04-24 VITALS — BP 130/80 | HR 76 | Temp 97.7°F | Wt 180.3 lb

## 2023-04-24 DIAGNOSIS — R0982 Postnasal drip: Secondary | ICD-10-CM | POA: Diagnosis not present

## 2023-04-24 DIAGNOSIS — J302 Other seasonal allergic rhinitis: Secondary | ICD-10-CM

## 2023-04-24 NOTE — Progress Notes (Signed)
 Established Patient Office Visit     CC/Reason for Visit: Hoarse voice, postnasal drip  HPI: Karen [name] is a 63 y.o. female who is coming in today for the above mentioned reasons.  This sometimes have been ongoing now for about 10 days.  She usually has seasonal allergies and just started taking her Allegra 3 days ago.  No recent travel or sick contacts.   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 Cornett, MD;  Location: [facility] OR;  Service: General;  Laterality: N/A;   COLONOSCOPY  2011   MS-F/V-moveiprep(food)-polypoid   UPPER GASTROINTESTINAL ENDOSCOPY  2019   MS-MAC-recall 3 yrs    Social History:  reports that she quit smoking about 35 years ago. Her smoking use included cigarettes. She started smoking about 56 years ago. She has a 20.3 pack-year smoking history. She has never used smokeless tobacco. She reports that she does not drink alcohol and does not use drugs.  Allergies: Allergies  Allergen Reactions   Milk-Related Compounds Other (See Comments)    Intolerance to milk products-causes gas    Family History:  Family History  Problem Relation Age of Onset   Hypertension Mother    Asthma Mother    Cancer Father 40       larynx cancer    Breast cancer Maternal Aunt 64   Colon cancer Neg Hx    Colon polyps Neg Hx    Esophageal cancer Neg Hx    Rectal cancer Neg Hx    Stomach cancer Neg Hx      Current Outpatient Medications:    buPROPion  (WELLBUTRIN XL) 150 MG 24 hr tablet, Take 150 mg by mouth daily., Disp: , Rfl:    FOLIC ACID PO, Take by mouth daily., Disp: , Rfl:    levothyroxine (SYNTHROID) 88 MCG tablet, TAKE 1 TABLET BY MOUTH EVERY DAY *STOP , Disp: 90 tablet, Rfl: 1   omeprazole (PRILOSEC) 20 MG capsule, TAKE 1 CAPSULE BY MOUTH EVERY DAY, Disp: 90 capsule, Rfl: 1   risperiDONE (RISPERDAL) 1 MG tablet, Take 1 mg by mouth at bedtime., Disp: , Rfl: 1   Vitamin D, Ergocalciferol, (DRISDOL) 1.25 MG (50000 UNIT) CAPS capsule, TAKE 1 CAPSULE BY MOUTH EVERY 14 (FOURTEEN) DAYS., Disp: 6 capsule, Rfl: 2  Review of Systems:  Negative unless indicated in HPI.   Physical Exam: Vitals:   04/24/23 1037  BP: 130/80  Pulse: 76  Temp: 97.7 F (36.5 C)  TempSrc: Oral  SpO2: 98%  Weight: 180 lb 4.8 oz (81.8 kg)    Body mass index is 25.87 kg/m.   Physical Exam Vitals reviewed.  Constitutional:      Appearance: Normal appearance.  HENT:     Mouth/Throat:     Mouth: Mucous membranes are moist.     Pharynx: Posterior oropharyngeal erythema present.  Eyes:     Conjunctiva/sclera: Conjunctivae normal.  Pupils: Pupils are equal, round, and reactive to light.  Cardiovascular:     Rate and Rhythm: Normal rate and regular rhythm.  Pulmonary:     Effort: Pulmonary effort is normal.     Breath sounds: Normal breath sounds.  Neurological:     Mental Status: She is alert.      Impression and Plan:  Seasonal allergies  PND (post-nasal drip)   -All her symptoms likely correlate with spring seasonal allergies.  Have advised continued use of antihistamine and addition of Flonase.  Time spent:22 minutes reviewing chart, interviewing and examining patient and formulating plan of care.     Chaya Jan, MD [facility] at [facility]

## 2023-04-25 ENCOUNTER — Telehealth: Payer: Self-pay

## 2023-04-25 NOTE — Telephone Encounter (Signed)
 Copied from CRM [id number]. Topic: Clinical - Medication Question >> Apr 25, 2023  3:59 PM Elizebeth Brooking wrote: Reason for CRM: Patient called in stating she went to her pharmacy to pick up her Flonase that was suppose to be prescribed from Dr.Hernandez, and they had no record of it

## 2023-04-28 MED ORDER — FLUTICASONE PROPIONATE 50 MCG/ACT NA SUSP: 2.0000 | Freq: Every day | NASAL | 2 refills | Status: DC

## 2023-04-28 NOTE — Addendum Note (Signed)
 Addended by: Kern Reap B on: 04/28/2023 09:00 AM   Modules accepted: Orders

## 2023-04-28 NOTE — Telephone Encounter (Signed)
 Attempted to call the patient, but unable to leave a message. Rx sent.

## 2023-05-02 ENCOUNTER — Other Ambulatory Visit: Payer: Self-pay | Admitting: Family Medicine

## 2023-05-02 NOTE — Telephone Encounter (Signed)
 Copied from CRM [id number]. Topic: Clinical - Medication Refill >> May 02, 2023  3:50 PM Cammy Copa D wrote: Most Recent Primary Care Visit:  Provider: Henderson Cloud  Department: [facility]  Visit Type: ACUTE  Date: 04/24/2023  Medication: Vitamin D, Ergocalciferol, (DRISDOL) 1.25 MG (50000 UNIT) CAPS capsule  Has the patient contacted their pharmacy? Yes (Agent: If no, request that the patient contact the pharmacy for the refill. If patient does not wish to contact the pharmacy document the reason why and proceed with request.) (Agent: If yes, when and what did the pharmacy advise?) Pharmacy said they did not have a refill for her.  Is this the correct pharmacy for this prescription? Yes If no, delete pharmacy and type the correct one.  This is the patient's preferred pharmacy:  [facility] [address], [place] - [address] [place] [address] Phone: [phone] Fax: [phone]   Has the prescription been filled recently? No  Is the patient out of the medication? Yes  Has the patient been seen for an appointment in the last year OR does the patient have an upcoming appointment? Yes  Can we respond through MyChart? No  Agent: Please be advised that Rx refills may take up to 3 business days. We ask that you follow-up with your pharmacy.

## 2023-05-03 ENCOUNTER — Other Ambulatory Visit: Payer: Self-pay | Admitting: Family Medicine

## 2023-05-03 DIAGNOSIS — E039 Hypothyroidism, unspecified: Secondary | ICD-10-CM

## 2023-05-26 ENCOUNTER — Inpatient Hospital Stay: Payer: BC Managed Care – PPO | Attending: Hematology and Oncology

## 2023-05-26 ENCOUNTER — Inpatient Hospital Stay (HOSPITAL_BASED_OUTPATIENT_CLINIC_OR_DEPARTMENT_OTHER): Payer: BC Managed Care – PPO | Admitting: Hematology and Oncology

## 2023-05-26 VITALS — BP 119/72 | HR 71 | Temp 98.1°F | Resp 18 | Ht 70.0 in | Wt 180.2 lb

## 2023-05-26 DIAGNOSIS — N189 Chronic kidney disease, unspecified: Secondary | ICD-10-CM | POA: Diagnosis not present

## 2023-05-26 DIAGNOSIS — D631 Anemia in chronic kidney disease: Secondary | ICD-10-CM | POA: Diagnosis not present

## 2023-05-26 DIAGNOSIS — E539 Vitamin B deficiency, unspecified: Secondary | ICD-10-CM | POA: Diagnosis not present

## 2023-05-26 LAB — CBC WITH DIFFERENTIAL (CANCER CENTER ONLY)
Abs Immature Granulocytes: 0 10*3/uL (ref 0.00–0.07)
Basophils Absolute: 0 10*3/uL (ref 0.0–0.1)
Basophils Relative: 0 %
Eosinophils Absolute: 0 10*3/uL (ref 0.0–0.5)
Eosinophils Relative: 1 %
HCT: 27.1 % — ABNORMAL LOW (ref 36.0–46.0)
Hemoglobin: 8.8 g/dL — ABNORMAL LOW (ref 12.0–15.0)
Immature Granulocytes: 0 %
Lymphocytes Relative: 25 %
Lymphs Abs: 0.9 10*3/uL (ref 0.7–4.0)
MCH: 29.4 pg (ref 26.0–34.0)
MCHC: 32.5 g/dL (ref 30.0–36.0)
MCV: 90.6 fL (ref 80.0–100.0)
Monocytes Absolute: 0.3 10*3/uL (ref 0.1–1.0)
Monocytes Relative: 9 %
Neutro Abs: 2.3 10*3/uL (ref 1.7–7.7)
Neutrophils Relative %: 65 %
Platelet Count: 194 10*3/uL (ref 150–400)
RBC: 2.99 MIL/uL — ABNORMAL LOW (ref 3.87–5.11)
RDW: 13.8 % (ref 11.5–15.5)
WBC Count: 3.5 10*3/uL — ABNORMAL LOW (ref 4.0–10.5)
nRBC: 0 % (ref 0.0–0.2)

## 2023-05-26 LAB — FERRITIN: Ferritin: 49 ng/mL (ref 11–307)

## 2023-05-26 LAB — IRON AND IRON BINDING CAPACITY (CC-WL,HP ONLY)
Iron: 44 ug/dL (ref 28–170)
Saturation Ratios: 15 % (ref 10.4–31.8)
TIBC: 295 ug/dL (ref 250–450)
UIBC: 251 ug/dL (ref 148–442)

## 2023-05-26 NOTE — Progress Notes (Signed)
 Patient Care Team: [place], [name], MD as PCP - General (Family Medicine)  DIAGNOSIS:  Encounter Diagnosis  Name Primary?   Anemia of renal disease Yes     CHIEF COMPLIANT:   HISTORY OF PRESENT ILLNESS:  History of Present Illness The patient, with a history of anemia, presents for a routine follow-up. She reports feeling well and denies fatigue. She has been off hydroxychloroquine for lupus since her last visit, as the rheumatologist did not believe she had lupus. She has not seen the rheumatologist since and was advised to follow-up in a year if needed.  The patient's white blood cell count has improved since stopping the hydroxychloroquine. However, she remains anemic with a hemoglobin of 8.8, the lowest it has been in a long time. She has been in the range of 9-10 for the past few years. The patient received Retacrit  injections for anemia over three years ago, but it did not seem to make a significant difference in her hemoglobin levels.  The patient is also preparing to retire from her job at [employer], but will continue working at [employer]. She denies any difficulty with her work due to anemia.     ALLERGIES:  is allergic to milk-related compounds.  MEDICATIONS:  Current Outpatient Medications  Medication Sig Dispense Refill   buPROPion  (WELLBUTRIN  XL) 150 MG 24 hr tablet Take 150 mg by mouth daily.     fluticasone  (FLONASE ) 50 MCG/ACT nasal spray Place 2 sprays into both nostrils daily. 16 g 2   FOLIC ACID PO Take by mouth daily.     levothyroxine  (SYNTHROID ) 88 MCG tablet TAKE 1 TABLET BY MOUTH EVERY DAY *STOP 125MCG 90 tablet 2   omeprazole  (PRILOSEC ) 20 MG capsule TAKE 1 CAPSULE BY MOUTH EVERY DAY 90 capsule 2   risperiDONE (RISPERDAL) 1 MG tablet Take 1 mg by mouth at bedtime.  1   Vitamin D , Ergocalciferol , (DRISDOL ) 1.25 MG (50000 UNIT) CAPS capsule TAKE 1 CAPSULE BY MOUTH EVERY 14 DAYS. 6 capsule 2   No current facility-administered medications for this  visit.    PHYSICAL EXAMINATION: ECOG PERFORMANCE STATUS: 1 - Symptomatic but completely ambulatory  Vitals:   05/26/23 1400  BP: 119/72  Pulse: 71  Resp: 18  Temp: 98.1 F (36.7 C)  SpO2: 100%   Filed Weights   05/26/23 1400  Weight: 180 lb 3.2 oz (81.7 kg)      LABORATORY DATA:  I have reviewed the data as listed    Latest Ref Rng & Units 02/03/2023    9:55 AM 11/12/2022    2:38 PM 01/30/2022   11:56 AM  CMP  Glucose 70 - 99 mg/dL 90  82  85   BUN 6 - 23 mg/dL 22  17  20    Creatinine 0.40 - 1.20 mg/dL 4.09  8.11  9.14   Sodium 135 - 145 mEq/L 141  140  141   Potassium 3.5 - 5.1 mEq/L 4.1  4.0  4.4   Chloride 96 - 112 mEq/L 106  109  106   CO2 19 - 32 mEq/L 28  27  29    Calcium 8.4 - 10.5 mg/dL 9.3  9.0  9.1   Total Protein 6.5 - 8.1 g/dL  6.7  6.7   Total Bilirubin 0.3 - 1.2 mg/dL  0.6  0.6   Alkaline Phos 38 - 126 U/L  60  42   AST 15 - 41 U/L  39  21   ALT 0 - 44  U/L  30  14     Lab Results  Component Value Date   WBC 3.5 (L) 05/26/2023   HGB 8.8 (L) 05/26/2023   HCT 27.1 (L) 05/26/2023   MCV 90.6 05/26/2023   PLT 194 05/26/2023   NEUTROABS 2.3 05/26/2023    ASSESSMENT & PLAN:  Anemia of renal disease Previous iron infusion: June 2018 ESA therapy: Previously received Aranesp  2-3 times per year from 2014 (with Dr. Maria Shiner) Current treatment: Observation goal: Hemoglobin of 10 or above Last Retacrit  10/29/2019   Bone marrow biopsy performed because of anemia and leukopenia: Normocellular bone marrow with trilineage hematopoiesis with mild megakaryocytic atypia   Lab review:   07/21/2019: Hemoglobin 9.2, WBC was 5.2 and platelet count 203 09/01/2019: Hb 9, WBC 2.2 ANC 1.2 12/31/2019: Hemoglobin 9.1 (Retacrit  was not given because of insurance issues) 02/17/2020: Hemoglobin 9.3 05/17/20: Hemoglobin 10.1, ANC 1.3 11/08/2020: Hemoglobin 9.2 11/08/2021: Hemoglobin 9.7 05/13/2022: Hemoglobin 9.3 11/12/2022: Hemoglobin 9.2, ANC 1.3, WBC 2.8 05/26/2023:  Hemoglobin 8.8, ANC 2.3, WBC 3.5   She discontinued hydroxychloroquine in October 2024.  This was being given at [facility]. In spite of her hemoglobin being 8.8 she is completely asymptomatic.  She tells me that she feels great and that she is retiring from her job and is looking forward to her retirement.   Follow-up in 6 months to reassess labs and overall health status.  ------------------------------------- Assessment and Plan Assessment & Plan Anemia of renal disease Chronic anemia with hemoglobin at 8.8 g/dL, likely due to renal disease. Iron deficiency, B12 deficiency, and bone marrow disorders excluded. No significant response to prior ESA treatment. Asymptomatic. ESA reconsideration if hemoglobin <8 g/dL. - Monitor hemoglobin every six months. - Check iron levels and address issues. - Consider ESA therapy if hemoglobin <8 g/dL.  Lupus Rheumatologist at [facility] questions lupus diagnosis. Hydroxychloroquine discontinued. White blood cell count improved post-discontinuation. - No current treatment for lupus.      No orders of the defined types were placed in this encounter.  The patient has a good understanding of the overall plan. she agrees with it. she will call with any problems that may develop before the next visit here. Total time spent: 30 mins including face to face time and time spent for planning, charting and co-ordination of care   Viinay K Faige Seely, MD 05/26/23

## 2023-05-26 NOTE — Assessment & Plan Note (Signed)
 Previous iron infusion: June 2018 ESA therapy: Previously received Aranesp  2-3 times per year from 2014 (with Dr. Maria Shiner) Current treatment: Retacrit  20,000 units monthly started 10/02/2018 (2 doses received so far) goal: Hemoglobin of 10 or above Last Retacrit  10/29/2019   Bone marrow biopsy performed because of anemia and leukopenia: Normocellular bone marrow with trilineage hematopoiesis with mild megakaryocytic atypia   Lab review:   07/21/2019: Hemoglobin 9.2, WBC was 5.2 and platelet count 203 09/01/2019: Hb 9, WBC 2.2 ANC 1.2 12/31/2019: Hemoglobin 9.1 (Retacrit  was not given because of insurance issues) 02/17/2020: Hemoglobin 9.3 05/17/20: Hemoglobin 10.1, ANC 1.3 11/08/2020: Hemoglobin 9.2 11/08/2021: Hemoglobin 9.7 05/13/2022: Hemoglobin 9.3 11/12/2022: Hemoglobin 9.2, ANC 1.3, WBC 2.8 05/26/2023:   Patient is going through treatment for lupus through [facility].  (Hydroxychloroquine)     Follow-up in 6 months to reassess labs and overall health status.

## 2023-06-03 ENCOUNTER — Encounter: Payer: Self-pay | Admitting: Family

## 2023-06-03 ENCOUNTER — Telehealth: Payer: Self-pay | Admitting: Hematology and Oncology

## 2023-06-03 NOTE — Telephone Encounter (Signed)
 Left patient a vm regarding upcoming appointment

## 2023-06-09 ENCOUNTER — Telehealth: Payer: Self-pay

## 2023-06-09 ENCOUNTER — Inpatient Hospital Stay: Admitting: Hematology and Oncology

## 2023-06-09 NOTE — Assessment & Plan Note (Deleted)
 Previous iron infusion: June 2018 ESA therapy: Previously received Aranesp  2-3 times per year from 2014 (with Dr. Maria Shiner) Current treatment: Observation goal: Hemoglobin of 10 or above Last Retacrit  10/29/2019   Bone marrow biopsy performed because of anemia and leukopenia: Normocellular bone marrow with trilineage hematopoiesis with mild megakaryocytic atypia   Lab review:   07/21/2019: Hemoglobin 9.2, WBC was 5.2 and platelet count 203 09/01/2019: Hb 9, WBC 2.2 ANC 1.2 12/31/2019: Hemoglobin 9.1 (Retacrit  was not given because of insurance issues) 02/17/2020: Hemoglobin 9.3 05/17/20: Hemoglobin 10.1, ANC 1.3 11/08/2020: Hemoglobin 9.2 11/08/2021: Hemoglobin 9.7 05/13/2022: Hemoglobin 9.3 11/12/2022: Hemoglobin 9.2, ANC 1.3, WBC 2.8 05/26/2023: Hemoglobin 8.8, ANC 2.3, WBC 3.5   She discontinued hydroxychloroquine in October 2024.  This was being given at [facility]. In spite of her hemoglobin being 8.8 she is completely asymptomatic.  She tells me that she feels great and that she is retiring from her job and is looking forward to her retirement.   Lupus Rheumatologist at [facility] questions lupus diagnosis. Hydroxychloroquine discontinued. White blood cell count improved post-discontinuation. - No current treatment for lupus.  Follow-up in 6 months to reassess labs and overall health status.

## 2023-06-09 NOTE — Telephone Encounter (Signed)
 Called pt and LVM to let her know we are cancelling her phone visit with Dr Lee Public today. This appt was made in error and pt is to f/u in 6 mos.

## 2023-07-01 ENCOUNTER — Encounter: Payer: Self-pay | Admitting: Family

## 2023-07-04 ENCOUNTER — Ambulatory Visit: Payer: Self-pay | Admitting: Family Medicine

## 2023-07-04 ENCOUNTER — Ambulatory Visit (INDEPENDENT_AMBULATORY_CARE_PROVIDER_SITE_OTHER): Admitting: Family Medicine

## 2023-07-04 ENCOUNTER — Encounter: Payer: Self-pay | Admitting: Family Medicine

## 2023-07-04 VITALS — BP 126/74 | HR 76 | Temp 98.3°F | Resp 12 | Ht 70.0 in | Wt 178.4 lb

## 2023-07-04 DIAGNOSIS — I10 Essential (primary) hypertension: Secondary | ICD-10-CM

## 2023-07-04 DIAGNOSIS — Z1211 Encounter for screening for malignant neoplasm of colon: Secondary | ICD-10-CM

## 2023-07-04 DIAGNOSIS — D638 Anemia in other chronic diseases classified elsewhere: Secondary | ICD-10-CM

## 2023-07-04 DIAGNOSIS — E039 Hypothyroidism, unspecified: Secondary | ICD-10-CM | POA: Diagnosis not present

## 2023-07-04 LAB — TSH: TSH: 1.07 u[IU]/mL (ref 0.35–5.50)

## 2023-07-04 LAB — CBC
HCT: 29.4 % — ABNORMAL LOW (ref 36.0–46.0)
Hemoglobin: 9.5 g/dL — ABNORMAL LOW (ref 12.0–15.0)
MCHC: 32.3 g/dL (ref 30.0–36.0)
MCV: 91.4 fl (ref 78.0–100.0)
Platelets: 187 10*3/uL (ref 150.0–400.0)
RBC: 3.22 Mil/uL — ABNORMAL LOW (ref 3.87–5.11)
RDW: 13.3 % (ref 11.5–15.5)
WBC: 2.9 10*3/uL — ABNORMAL LOW (ref 4.0–10.5)

## 2023-07-04 NOTE — Patient Instructions (Addendum)
 A few things to remember from today's visit:  Hypertension, essential, benign - Plan: CBC  Hypothyroidism, unspecified type - Plan: TSH  Anemia of chronic disease - Plan: CBC, TSH  Colon cancer screening - Plan: Ambulatory referral to Gastroenterology  No changes today.  If you need refills for medications you take chronically, please call your pharmacy. Do not use My Chart to request refills or for acute issues that need immediate attention. If you send a my chart message, it may take a few days to be addressed, specially if I am not in the office.  Please be sure medication list is accurate. If a new problem present, please set up appointment sooner than planned today.

## 2023-07-04 NOTE — Assessment & Plan Note (Signed)
 Last TSH mildly abnormal, 6.1 in 01/2023. Continue levothyroxine  88 mcg daily. Further recommendations according to TSH result.

## 2023-07-04 NOTE — Progress Notes (Signed)
 Chief Complaint  Patient presents with   Medical Management of Chronic Issues    Pt reports she is here to get her lab. Would like to get hemoglobin. Was told to be seen before getting lab. Won't be seeing cancer center in 6 months.     HPI: Karen [name] is a 63 y.o. female with a PMHx significant for iron deficiency anemia, CKD III, lupus, depression, vitamin D  deficiency, hypothyroidism, and HLD, who is here today for chronic disease management.  Last seen on 02/03/2023.  She would like to follow on anemia today. Follows with hematologist every 6 months but concerned about worsening problem. Problem thought to be related to CKD. Extensive work up, including bone marrow bx.  She has been taking OTC iron supplementation.  Received Retacrit  injections for anemia for a couple years but it did not seem to help with H/H numbers.   She mentions she falls asleep frequently when she sits down at the end of the day after work, but denies increased fatigue.  Also denies bruising, nosebleed, gum bleeds, blood in stool, blood in urine, or pica. Last colonoscopy 03/2020.  Lab Results  Component Value Date   WBC 3.5 (L) 05/26/2023   HGB 8.8 (L) 05/26/2023   HCT 27.1 (L) 05/26/2023   MCV 90.6 05/26/2023   PLT 194 05/26/2023   Lupus:  She is no longer following with rheumatology.  She has not noticed any difference since stopping plaquenil. Also no longer taking prednisone  or plaquenil.    HTN and CKD III: On non pharmacologic treatment. Negative for chest pain, dyspnea, palpitation, gross hematuria,foam in urine, focal weakness, or edema.  Lab Results  Component Value Date   NA 141 02/03/2023   CL 106 02/03/2023   K 4.1 02/03/2023   CO2 28 02/03/2023   BUN 22 02/03/2023   CREATININE 1.06 02/03/2023   GFR 55.83 (L) 02/03/2023   CALCIUM 9.3 02/03/2023   ALBUMIN 4.0 11/12/2022   GLUCOSE 90 02/03/2023   Hypothyroidism:  Currently on levothyroxine  88 mcg daily.  Lab Results   Component Value Date   TSH 6.18 (H) 02/03/2023   Depression/bipolar disorder:  Currently on bupropion  150 mg daily and risperidone 1 mg daily.   Still following regularly with psychiatry.   Review of Systems  Constitutional:  Negative for activity change, appetite change, fever and unexpected weight change.  Respiratory:  Negative for cough and wheezing.   Gastrointestinal:  Negative for abdominal pain, nausea and vomiting.  Endocrine: Negative for cold intolerance and heat intolerance.  Genitourinary:  Negative for decreased urine volume, dysuria and hematuria.  Musculoskeletal:  Negative for gait problem and myalgias.  Skin:  Negative for rash.  Neurological:  Negative for syncope, weakness and headaches.  Psychiatric/Behavioral:  Negative for confusion.   See other pertinent positives and negatives in HPI.  Current Outpatient Medications on File Prior to Visit  Medication Sig Dispense Refill   buPROPion  (WELLBUTRIN  XL) 150 MG 24 hr tablet Take 150 mg by mouth daily.     fluticasone  (FLONASE ) 50 MCG/ACT nasal spray Place 2 sprays into both nostrils daily. 16 g 2   FOLIC ACID PO Take by mouth daily.     levothyroxine  (SYNTHROID ) 88 MCG tablet TAKE 1 TABLET BY MOUTH EVERY DAY *STOP 90 tablet 2   omeprazole  (PRILOSEC ) 20 MG capsule TAKE 1 CAPSULE BY MOUTH EVERY DAY 90 capsule 2   risperiDONE (RISPERDAL) 1 MG tablet Take 1 mg by mouth at bedtime.  1  Vitamin D , Ergocalciferol , (DRISDOL ) 1.25 MG (50000 UNIT) CAPS capsule TAKE 1 CAPSULE BY MOUTH EVERY 14 DAYS. 6 capsule 2   No current facility-administered medications on file prior to visit.   Past Medical History:  Diagnosis Date   Anemia    IDA-not taking meds at this time due to insurance   Barrett esophagus    Depression    hx of   DUB (dysfunctional uterine bleeding)    GERD (gastroesophageal reflux disease)    on meds   Graves disease    Hypertension    on meds   Hypothyroidism    on meds   Allergies   Allergen Reactions   Milk-Related Compounds Other (See Comments)    Intolerance to milk products-causes gas   Social History   Socioeconomic History   Marital status: Married    Spouse name: Not on file   Number of children: 0   Years of education: Not on file   Highest education level: Not on file  Occupational History    Employer: [employer]  Tobacco Use   Smoking status: Former    Current packs/day: 0.00    Average packs/day: 1 pack/day for 20.3 years (20.3 ttl pk-yrs)    Types: Cigarettes    Start date: 03/13/1967    Quit date: 07/14/1987    Years since quitting: 35.9   Smokeless tobacco: Never   Tobacco comments:    quit 26 years ago  Vaping Use   Vaping status: Never Used  Substance and Sexual Activity   Alcohol use: No    Alcohol/week: 0.0 standard drinks of alcohol   Drug use: No   Sexual activity: Not on file  Other Topics Concern   Not on file  Social History Narrative   Occupation: Estate agent   Regular exercise- yes   0 caffeine drinks    Social Drivers of Corporate investment banker Strain: Not on file  Food Insecurity: Not on file  Transportation Needs: Not on file  Physical Activity: Not on file  Stress: Not on file  Social Connections: Unknown (06/19/2021)   Received from [facility]   Social Network    Social Network: Not on file   Today's Vitals   07/04/23 1342  BP: 126/74  Pulse: 76  Resp: 12  Temp: 98.3 F (36.8 C)  SpO2: 98%  Weight: 178 lb 6.4 oz (80.9 kg)   Body mass index is 25.6 kg/m.  Physical Exam Vitals and nursing note reviewed.  Constitutional:      General: She is not in acute distress.    Appearance: She is well-developed.  HENT:     Head: Normocephalic and atraumatic.  Eyes:     Conjunctiva/sclera: Conjunctivae normal.  Cardiovascular:     Rate and Rhythm: Normal rate and regular rhythm.     Pulses:          Dorsalis pedis pulses are 2+ on the right side and 2+ on the left side.     Heart  sounds: No murmur heard. Pulmonary:     Effort: Pulmonary effort is normal. No respiratory distress.     Breath sounds: Normal breath sounds.  Abdominal:     Palpations: Abdomen is soft. There is no mass.     Tenderness: There is no abdominal tenderness.  Musculoskeletal:     Right lower leg: No edema.     Left lower leg: No edema.  Skin:    General: Skin is warm.  Findings: No erythema or rash.  Neurological:     General: No focal deficit present.     Mental Status: She is alert and oriented to person, place, and time.     Cranial Nerves: No cranial nerve deficit.     Gait: Gait normal.  Psychiatric:        Mood and Affect: Mood and affect normal.    ASSESSMENT AND PLAN:  Ms. Pierrelouis was seen today for chronic disease management.   Lab Results  Component Value Date   TSH 1.07 07/04/2023   Lab Results  Component Value Date   WBC 2.9 (L) 07/04/2023   HGB 9.5 (L) 07/04/2023   HCT 29.4 (L) 07/04/2023   MCV 91.4 07/04/2023   PLT 187.0 07/04/2023   Hypothyroidism, unspecified type Assessment & Plan: Last TSH mildly abnormal, 6.1 in 01/2023. Continue levothyroxine  88 mcg daily. Further recommendations according to TSH result.  Orders: -     TSH; Future  Anemia of chronic disease CKD and hx of lupus, neither one on pharmacologic treatment. Asymptomatic. Usually Hg 9-10 but last one 8.8 (05/26/23). She is concerned about worsening problem and would like to have CBC repeated. Follows with hematologist , Dr Lee Public. She is overdue for colonoscopy.  -     CBC; Future -     TSH; Future  Hypertension, essential, benign Assessment & Plan: BP adequately controlled. Currently she is on nonpharmacologic treatment. Monitor BP at home regularly.  Orders: -     CBC; Future  Colon cancer screening -     Ambulatory referral to Gastroenterology   Return if symptoms worsen or fail to improve, for keep next appointment.  I, Fritz Jewel Wierda, acting as a scribe for  Erico Stan [place], MD., have documented all relevant documentation on the behalf of Corin Formisano [place], MD, as directed by  Daniah Zaldivar [place], MD while in the presence of Shirlie Enck [place], MD.   I, Eyob Godlewski [place], MD, have reviewed all documentation for this visit. The documentation on 07/04/23 for the exam, diagnosis, procedures, and orders are all accurate and complete.  Ecko Beasley G. [place], MD  [facility] office.

## 2023-07-04 NOTE — Assessment & Plan Note (Signed)
 BP adequately controlled. Currently she is on nonpharmacologic treatment. Monitor BP at home regularly.

## 2023-07-13 ENCOUNTER — Other Ambulatory Visit: Payer: Self-pay | Admitting: Internal Medicine

## 2023-07-17 ENCOUNTER — Encounter: Payer: Self-pay | Admitting: Family

## 2023-08-21 ENCOUNTER — Encounter: Payer: Self-pay | Admitting: Family

## 2023-08-25 ENCOUNTER — Encounter: Payer: Self-pay | Admitting: Family

## 2023-08-28 ENCOUNTER — Encounter: Payer: Self-pay | Admitting: Gastroenterology

## 2023-08-28 ENCOUNTER — Ambulatory Visit (AMBULATORY_SURGERY_CENTER)

## 2023-08-28 VITALS — Ht 70.0 in | Wt 170.0 lb

## 2023-08-28 DIAGNOSIS — Z860100 Personal history of colon polyps, unspecified: Secondary | ICD-10-CM

## 2023-08-28 MED ORDER — NA SULFATE-K SULFATE-MG SULF 17.5-3.13-1.6 GM/177ML PO SOLN: 1.0000 | Freq: Once | ORAL | 0 refills | Status: AC

## 2023-08-28 NOTE — Progress Notes (Signed)

## 2023-09-16 ENCOUNTER — Ambulatory Visit: Admitting: Gastroenterology

## 2023-09-16 ENCOUNTER — Telehealth: Payer: Self-pay | Admitting: Gastroenterology

## 2023-09-16 VITALS — BP 132/73 | HR 58 | Temp 97.1°F | Ht 70.0 in | Wt 170.0 lb

## 2023-09-16 DIAGNOSIS — Z860100 Personal history of colon polyps, unspecified: Secondary | ICD-10-CM

## 2023-09-16 MED ORDER — NA SULFATE-K SULFATE-MG SULF 17.5-3.13-1.6 GM/177ML PO SOLN: 1.0000 | Freq: Once | ORAL | 0 refills | Status: AC

## 2023-09-16 MED ORDER — SODIUM CHLORIDE 0.9 % IV SOLN: 500.0000 mL | Freq: Once | INTRAVENOUS | Status: DC

## 2023-09-16 MED ORDER — FLEET ENEMA RE ENEM
1.0000 | ENEMA | Freq: Once | RECTAL | Status: AC
  Administered 2023-09-16 (×2): 1 via RECTAL

## 2023-09-16 NOTE — Progress Notes (Signed)
 Patient to be schedule to come back at latter date,. Patient forgot to drink second half of prep.

## 2023-09-16 NOTE — Telephone Encounter (Signed)
 Patient called this AM. She woke up and had forgotten to take AM preparation. Her stools are loose without any sediment per report. She is scheduled for arrival at 930AM. Patient is willing to come in for procedure and even perform enema if necessary. Patient was offered potential rescheduling, but I think it is reasonable for her to come in. She knows that if there is any significant sediment or solid stool that is found on her next bowel movement, or if she has significant inadequate preparation during her colonoscopy that she may need a repeat colonoscopy. She agrees with moving forward.  I will forward this to the admitting team and Dr. Legrand.   Aloha Finner, MD [facility] Office # [phone]

## 2023-09-18 ENCOUNTER — Ambulatory Visit (AMBULATORY_SURGERY_CENTER): Admitting: Gastroenterology

## 2023-09-18 ENCOUNTER — Encounter: Payer: Self-pay | Admitting: Gastroenterology

## 2023-09-18 VITALS — BP 129/62 | HR 70 | Temp 97.4°F | Resp 19 | Ht 70.0 in | Wt 170.0 lb

## 2023-09-18 DIAGNOSIS — K635 Polyp of colon: Secondary | ICD-10-CM | POA: Diagnosis not present

## 2023-09-18 DIAGNOSIS — K5730 Diverticulosis of large intestine without perforation or abscess without bleeding: Secondary | ICD-10-CM | POA: Diagnosis not present

## 2023-09-18 DIAGNOSIS — D120 Benign neoplasm of cecum: Secondary | ICD-10-CM

## 2023-09-18 DIAGNOSIS — Z1211 Encounter for screening for malignant neoplasm of colon: Secondary | ICD-10-CM

## 2023-09-18 DIAGNOSIS — Z860101 Personal history of adenomatous and serrated colon polyps: Secondary | ICD-10-CM | POA: Diagnosis not present

## 2023-09-18 DIAGNOSIS — D122 Benign neoplasm of ascending colon: Secondary | ICD-10-CM

## 2023-09-18 DIAGNOSIS — Z860100 Personal history of colon polyps, unspecified: Secondary | ICD-10-CM

## 2023-09-18 MED ORDER — SODIUM CHLORIDE 0.9 % IV SOLN: 500.0000 mL | INTRAVENOUS | Status: DC

## 2023-09-18 NOTE — Progress Notes (Signed)
 History and Physical:  This patient presents for endoscopic testing for: Encounter Diagnosis  Name Primary?   Hx of colonic polyps Yes    63 year old woman here today for a surveillance colonoscopy. Last colonoscopy February 2022 (Dr. Aneita) -6 subcentimeter tubular adenomas and hyperplastic polyps Patient denies chronic abdominal pain, rectal bleeding, constipation or diarrhea.   Patient is otherwise without complaints or active issues today.   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 Cornett, MD;  Location: [facility] OR;  Service: General;  Laterality: N/A;   COLONOSCOPY  2011   MS-F/V-moveiprep(food)-polypoid   UPPER GASTROINTESTINAL ENDOSCOPY  2019   MS-MAC-recall 3 yrs    Allergies: Allergies  Allergen Reactions   Milk-Related Compounds Other (See Comments)    Intolerance to milk products-causes gas    Outpatient Meds: Current Outpatient Medications  Medication Sig Dispense Refill   buPROPion  (WELLBUTRIN  XL) 150 MG 24 hr tablet Take 150 mg by mouth daily.     fluticasone  (FLONASE ) 50 MCG/ACT nasal spray SPRAY 2 SPRAYS INTO EACH NOSTRIL EVERY DAY 48 mL 1   FOLIC ACID PO Take by mouth daily.     levothyroxine  (SYNTHROID ) 88 MCG tablet TAKE 1 TABLET BY MOUTH EVERY DAY *STOP 125MCG 90 tablet 2   omeprazole  (PRILOSEC ) 20 MG capsule TAKE 1 CAPSULE BY MOUTH EVERY DAY 90 capsule 2   risperiDONE (RISPERDAL) 1 MG tablet Take 1 mg by mouth at bedtime.  1    Vitamin D , Ergocalciferol , (DRISDOL ) 1.25 MG (50000 UNIT) CAPS capsule TAKE 1 CAPSULE BY MOUTH EVERY 14 DAYS. 6 capsule 2   Current Facility-Administered Medications  Medication Dose Route Frequency Provider Last Rate Last Admin   0.9 %  sodium chloride  infusion  500 mL Intravenous Continuous Danis, Victory CROME III, MD          ___________________________________________________________________ Objective   Exam:  BP 132/73   Pulse (!) 58   Temp (!) 97.4 F (36.3 C)   Ht 5' 10 (1.778 m)   Wt 170 lb (77.1 kg)   SpO2 97%   BMI 24.39 kg/m   CV: regular , S1/S2 Resp: clear to auscultation bilaterally, normal RR and effort noted GI: soft, no tenderness, with active bowel sounds.   Assessment: Encounter Diagnosis  Name Primary?   Hx of colonic polyps Yes     Plan: Colonoscopy   The benefits and risks of the planned procedure(s) were described in detail with the patient or (when appropriate) their health care proxy.  Risks were outlined as including, but not limited to, bleeding, infection, perforation, adverse medication reaction leading to cardiac or pulmonary decompensation, pancreatitis (if ERCP).  The limitation of incomplete mucosal visualization was also discussed.  No guarantees or warranties were given.  The patient is appropriate for an endoscopic procedure in the ambulatory setting.   - Victory Brand, MD

## 2023-09-18 NOTE — Progress Notes (Signed)
 Report given to PACU, vss

## 2023-09-18 NOTE — Patient Instructions (Signed)
 YOU HAD AN ENDOSCOPIC PROCEDURE TODAY AT THE [facility]:   Refer to the procedure report that was given to you for any specific questions about what was found during the examination.  If the procedure report does not answer your questions, please call your gastroenterologist to clarify.  If you requested that your care partner not be given the details of your procedure findings, then the procedure report has been included in a sealed envelope for you to review at your convenience later.  YOU SHOULD EXPECT: Some feelings of bloating in the abdomen. Passage of more gas than usual.  Walking can help get rid of the air that was put into your GI tract during the procedure and reduce the bloating. If you had a lower endoscopy (such as a colonoscopy or flexible sigmoidoscopy) you may notice spotting of blood in your stool or on the toilet paper. If you underwent a bowel prep for your procedure, you may not have a normal bowel movement for a few days.  Please Note:  You might notice some irritation and congestion in your nose or some drainage.  This is from the oxygen used during your procedure.  There is no need for concern and it should clear up in a day or so.  SYMPTOMS TO REPORT IMMEDIATELY:  Following lower endoscopy (colonoscopy or flexible sigmoidoscopy):  Excessive amounts of blood in the stool  Significant tenderness or worsening of abdominal pains  Swelling of the abdomen that is new, acute  Fever of 100F or higher   Resume previous diet Continue present medications Await pathology results Handouts on polyps and diverticulosis given    For urgent or emergent issues, a gastroenterologist can be reached at any hour by calling [phone]. Do not use MyChart messaging for urgent concerns.    DIET:  We do recommend a small meal at first, but then you may proceed to your regular diet.  Drink plenty of fluids but you should avoid alcoholic beverages for 24 hours.  ACTIVITY:   You should plan to take it easy for the rest of today and you should NOT DRIVE or use heavy machinery until tomorrow (because of the sedation medicines used during the test).    FOLLOW UP: Our staff will call the number listed on your records the next business day following your procedure.  We will call around 7:15- 8:00 am to check on you and address any questions or concerns that you may have regarding the information given to you following your procedure. If we do not reach you, we will leave a message.     If any biopsies were taken you will be contacted by phone or by letter within the next 1-3 weeks.  Please call us  at [phone] if you have not heard about the biopsies in 3 weeks.    SIGNATURES/CONFIDENTIALITY: You and/or your care partner have signed paperwork which will be entered into your electronic medical record.  These signatures attest to the fact that that the information above on your After Visit Summary has been reviewed and is understood.  Full responsibility of the confidentiality of this discharge information lies with you and/or your care-partner.

## 2023-09-18 NOTE — Op Note (Signed)
 [facility] Patient Name: Karen [name] Procedure Date: 09/18/2023 8:37 AM MRN: [id number] Endoscopist: Victory L. Legrand , MD, [id number] Age: 63 Referring MD:  Date of Birth: April 11, 1960 Gender: Female Account #: [id number] Procedure:                Colonoscopy Indications:              Surveillance: Personal history of adenomatous                            polyps on last colonoscopy > 3 years ago                           6 subcentimeter tubular adenoma and hyperplastic                            polyps in February 2022 (Dr. Aneita) Medicines:                Monitored Anesthesia Care Procedure:                Pre-Anesthesia Assessment:                           - Prior to the procedure, a History and Physical                            was performed, and patient medications and                            allergies were reviewed. The patient's tolerance of                            previous anesthesia was also reviewed. The risks                            and benefits of the procedure and the sedation                            options and risks were discussed with the patient.                            All questions were answered, and informed consent                            was obtained. Prior Anticoagulants: The patient has                            taken no anticoagulant or antiplatelet agents. ASA                            Grade Assessment: II - A patient with mild systemic                            disease. After reviewing the risks and benefits,  the patient was deemed in satisfactory condition to                            undergo the procedure.                           After obtaining informed consent, the colonoscope                            was passed under direct vision. Throughout the                            procedure, the patient's blood pressure, pulse, and                            oxygen saturations were monitored  continuously. The                            Olympus Scope SN: [id number] was introduced through                            the anus and advanced to the the cecum, identified                            by appendiceal orifice and ileocecal valve. The                            colonoscopy was somewhat difficult due to a                            redundant colon. Successful completion of the                            procedure was aided by using manual pressure and                            straightening and shortening the scope to obtain                            bowel loop reduction. The patient tolerated the                            procedure well. The quality of the bowel                            preparation was good. The ileocecal valve,                            appendiceal orifice, and rectum were photographed. Scope In: 8:43:20 AM Scope Out: 9:02:26 AM Scope Withdrawal Time: 0 hours 13 minutes 42 seconds  Total Procedure Duration: 0 hours 19 minutes 6 seconds  Findings:                 The perianal and digital rectal examinations were  normal.                           Repeat examination of right colon under NBI                            performed.                           A diminutive polyp was found in the cecum (adjacent                            to the AO). The polyp was sessile. The polyp was                            removed with a cold snare. Resection and retrieval                            were complete. (Jar 1)                           A 6-8 mm polyp was found in the proximal ascending                            colon. The polyp was multi-lobulated and sessile.                            The polyp was removed with a cold snare. Resection                            and retrieval were complete.                           A few diverticula were found in the left colon.                           The exam was otherwise without abnormality  on                            direct and retroflexion views. Complications:            No immediate complications. Estimated Blood Loss:     Estimated blood loss was minimal. Impression:               - One diminutive polyp in the cecum, removed with a                            cold snare. Resected and retrieved.                           - One 6-8 mm polyp in the proximal ascending colon,                            removed with a cold snare. Resected and retrieved.                           -  Diverticulosis in the left colon.                           - The examination was otherwise normal on direct                            and retroflexion views. Recommendation:           - Patient has a contact number available for                            emergencies. The signs and symptoms of potential                            delayed complications were discussed with the                            patient. Return to normal activities tomorrow.                            Written discharge instructions were provided to the                            patient.                           - Resume previous diet.                           - Continue present medications.                           - Await pathology results.                           - Repeat colonoscopy is recommended for                            surveillance. The colonoscopy date will be                            determined after pathology results from today's                            exam become available for review. Von Inscoe L. Legrand, MD 09/18/2023 9:07:41 AM This report has been signed electronically.

## 2023-09-18 NOTE — Progress Notes (Signed)
 Pt's states no medical or surgical changes since previsit or office visit.

## 2023-09-18 NOTE — Progress Notes (Signed)
 Called to room to assist during endoscopic procedure.  Patient ID and intended procedure confirmed with present staff. Received instructions for my participation in the procedure from the performing physician.

## 2023-09-19 ENCOUNTER — Telehealth: Payer: Self-pay

## 2023-09-19 NOTE — Telephone Encounter (Signed)
 No answer after follow up call. Voice message left.

## 2023-09-22 LAB — SURGICAL PATHOLOGY

## 2023-09-23 ENCOUNTER — Ambulatory Visit: Payer: Self-pay | Admitting: Gastroenterology

## 2023-11-04 ENCOUNTER — Other Ambulatory Visit: Payer: Self-pay | Admitting: Family Medicine

## 2023-11-04 DIAGNOSIS — Z1231 Encounter for screening mammogram for malignant neoplasm of breast: Secondary | ICD-10-CM

## 2023-11-24 ENCOUNTER — Ambulatory Visit
Admission: RE | Admit: 2023-11-24 | Discharge: 2023-11-24 | Disposition: A | Source: Ambulatory Visit | Attending: Family Medicine | Admitting: Family Medicine

## 2023-11-24 DIAGNOSIS — Z1231 Encounter for screening mammogram for malignant neoplasm of breast: Secondary | ICD-10-CM

## 2023-11-25 ENCOUNTER — Other Ambulatory Visit: Payer: Self-pay | Admitting: Hematology and Oncology

## 2023-11-25 ENCOUNTER — Inpatient Hospital Stay

## 2023-11-25 ENCOUNTER — Inpatient Hospital Stay: Attending: Hematology and Oncology | Admitting: Hematology and Oncology

## 2023-11-25 DIAGNOSIS — D631 Anemia in chronic kidney disease: Secondary | ICD-10-CM | POA: Insufficient documentation

## 2023-11-25 DIAGNOSIS — N189 Chronic kidney disease, unspecified: Secondary | ICD-10-CM

## 2023-11-25 LAB — CBC WITH DIFFERENTIAL (CANCER CENTER ONLY)
Abs Immature Granulocytes: 0 K/uL (ref 0.00–0.07)
Basophils Absolute: 0 K/uL (ref 0.0–0.1)
Basophils Relative: 0 %
Eosinophils Absolute: 0 K/uL (ref 0.0–0.5)
Eosinophils Relative: 1 %
HCT: 32.7 % — ABNORMAL LOW (ref 36.0–46.0)
Hemoglobin: 10.5 g/dL — ABNORMAL LOW (ref 12.0–15.0)
Immature Granulocytes: 0 %
Lymphocytes Relative: 41 %
Lymphs Abs: 1.1 K/uL (ref 0.7–4.0)
MCH: 29.2 pg (ref 26.0–34.0)
MCHC: 32.1 g/dL (ref 30.0–36.0)
MCV: 91.1 fL (ref 80.0–100.0)
Monocytes Absolute: 0.2 K/uL (ref 0.1–1.0)
Monocytes Relative: 7 %
Neutro Abs: 1.4 K/uL — ABNORMAL LOW (ref 1.7–7.7)
Neutrophils Relative %: 51 %
Platelet Count: 209 K/uL (ref 150–400)
RBC: 3.59 MIL/uL — ABNORMAL LOW (ref 3.87–5.11)
RDW: 12.8 % (ref 11.5–15.5)
WBC Count: 2.8 K/uL — ABNORMAL LOW (ref 4.0–10.5)
nRBC: 0 % (ref 0.0–0.2)

## 2023-11-25 LAB — RETIC PANEL
Immature Retic Fract: 9.8 % (ref 2.3–15.9)
RBC.: 3.5 MIL/uL — ABNORMAL LOW (ref 3.87–5.11)
Retic Count, Absolute: 32.2 K/uL (ref 19.0–186.0)
Retic Ct Pct: 0.9 % (ref 0.4–3.1)
Reticulocyte Hemoglobin: 32.1 pg (ref >27.9)

## 2023-11-25 LAB — CMP (CANCER CENTER ONLY)
ALT: 16 U/L (ref 0–44)
AST: 26 U/L (ref 15–41)
Albumin: 4.3 g/dL (ref 3.5–5.0)
Alkaline Phosphatase: 82 U/L (ref 38–126)
Anion gap: 6 (ref 5–15)
BUN: 17 mg/dL (ref 8–23)
CO2: 27 mmol/L (ref 22–32)
Calcium: 10.2 mg/dL (ref 8.9–10.3)
Chloride: 105 mmol/L (ref 98–111)
Creatinine: 1.64 mg/dL — ABNORMAL HIGH (ref 0.44–1.00)
GFR, Estimated: 35 mL/min — ABNORMAL LOW (ref >60)
Glucose, Bld: 124 mg/dL — ABNORMAL HIGH (ref 70–99)
Potassium: 4.9 mmol/L (ref 3.5–5.1)
Sodium: 138 mmol/L (ref 135–145)
Total Bilirubin: 0.6 mg/dL (ref 0.0–1.2)
Total Protein: 7.8 g/dL (ref 6.5–8.1)

## 2023-11-25 LAB — IRON AND IRON BINDING CAPACITY (CC-WL,HP ONLY)
Iron: 70 ug/dL (ref 28–170)
Saturation Ratios: 22 % (ref 10.4–31.8)
TIBC: 316 ug/dL (ref 250–450)
UIBC: 246 ug/dL (ref 148–442)

## 2023-11-25 LAB — VITAMIN B12: Vitamin B-12: 1257 pg/mL — ABNORMAL HIGH (ref 180–914)

## 2023-11-25 LAB — FERRITIN: Ferritin: 163 ng/mL (ref 11–307)

## 2023-11-25 NOTE — Progress Notes (Signed)
 Patient Care Team: [place], [name], MD as PCP - General (Family Medicine)  DIAGNOSIS:  Encounter Diagnosis  Name Primary?   Anemia of renal disease     CHIEF COMPLIANT: Follow-up of anemia of chronic kidney disease  HISTORY OF PRESENT ILLNESS:  History of Present Illness Karen [name] is a 63 year old female with anemia who presents for follow-up of her blood counts.  She is actively working on improving her hemoglobin levels by incorporating leafy green vegetables into her diet and taking an iron supplement called Optimal Iron. She initially took one tablet in the morning and one at night, but has increased the dose to two tablets twice a day. Her hemoglobin has improved from 8.8 to 10.5 over the past year.  Her white blood cell count remains at around 2.8, with no recent infections.  Her kidney function has shown some fluctuation over the past two years, with a current creatinine level of 1.64, similar to previous levels. She does not take blood pressure medication and has stopped taking hydrochlorothiazide. She denies the use of NSAIDs and drinks plenty of water.     ALLERGIES:  is allergic to milk-related compounds.  MEDICATIONS:  Current Outpatient Medications  Medication Sig Dispense Refill   buPROPion  (WELLBUTRIN  XL) 150 MG 24 hr tablet Take 150 mg by mouth daily.     fluticasone  (FLONASE ) 50 MCG/ACT nasal spray SPRAY 2 SPRAYS INTO EACH NOSTRIL EVERY DAY 48 mL 1   FOLIC ACID PO Take by mouth daily.     levothyroxine  (SYNTHROID ) 88 MCG tablet TAKE 1 TABLET BY MOUTH EVERY DAY *STOP 90 tablet 2   omeprazole  (PRILOSEC ) 20 MG capsule TAKE 1 CAPSULE BY MOUTH EVERY DAY 90 capsule 2   risperiDONE (RISPERDAL) 1 MG tablet Take 1 mg by mouth at bedtime.  1   Vitamin D , Ergocalciferol , (DRISDOL ) 1.25 MG (50000 UNIT) CAPS capsule TAKE 1 CAPSULE BY MOUTH EVERY 14 DAYS. 6 capsule 2   No current facility-administered medications for this visit.    PHYSICAL  EXAMINATION: ECOG PERFORMANCE STATUS: 1 - Symptomatic but completely ambulatory  Vitals:   11/25/23 1340  BP: 132/65  Pulse: 84  Resp: 16  Temp: 98 F (36.7 C)  SpO2: 99%   Filed Weights   11/25/23 1340  Weight: 176 lb 14.4 oz (80.2 kg)    LABORATORY DATA:  I have reviewed the data as listed    Latest Ref Rng & Units 11/25/2023    1:24 PM 02/03/2023    9:55 AM 11/12/2022    2:38 PM  CMP  Glucose 70 - 99 mg/dL 875  90  82   BUN 8 - 23 mg/dL 17  22  17    Creatinine 0.44 - 1.00 mg/dL 8.35  8.93  8.35   Sodium 135 - 145 mmol/L 138  141  140   Potassium 3.5 - 5.1 mmol/L 4.9  4.1  4.0   Chloride 98 - 111 mmol/L 105  106  109   CO2 22 - 32 mmol/L 27  28  27    Calcium 8.9 - 10.3 mg/dL 89.7  9.3  9.0   Total Protein 6.5 - 8.1 g/dL 7.8   6.7   Total Bilirubin 0.0 - 1.2 mg/dL 0.6   0.6   Alkaline Phos 38 - 126 U/L 82   60   AST 15 - 41 U/L 26   39   ALT 0 - 44 U/L 16   30     Lab  Results  Component Value Date   WBC 2.8 (L) 11/25/2023   HGB 10.5 (L) 11/25/2023   HCT 32.7 (L) 11/25/2023   MCV 91.1 11/25/2023   PLT 209 11/25/2023   NEUTROABS 1.4 (L) 11/25/2023    ASSESSMENT & PLAN:  Anemia of renal disease Previous iron infusion: June 2018 ESA therapy: Previously received Aranesp  2-3 times per year from 2014 (with Dr. Timmy) Current treatment: Observation goal: Hemoglobin of 10 or above Last Retacrit  10/29/2019   Bone marrow biopsy performed because of anemia and leukopenia: Normocellular bone marrow with trilineage hematopoiesis with mild megakaryocytic atypia   Lab review:   07/21/2019: Hemoglobin 9.2, WBC was 5.2 and platelet count 203 09/01/2019: Hb 9, WBC 2.2 ANC 1.2 12/31/2019: Hemoglobin 9.1 (Retacrit  was not given because of insurance issues) 02/17/2020: Hemoglobin 9.3 05/17/20: Hemoglobin 10.1, ANC 1.3 11/08/2020: Hemoglobin 9.2 11/08/2021: Hemoglobin 9.7 05/13/2022: Hemoglobin 9.3 11/12/2022: Hemoglobin 9.2, ANC 1.3, WBC 2.8 11/05/2023: Hemoglobin 10.5, ANC  1.4, WBC 2.8, platelets 219 She is doing extremely well without requiring any injections.  Therefore we will continue to watch and monitor.    She discontinued hydroxychloroquine in October 2024.  This was being given at [facility]  She has retired from her job and has taken photography classes Renal dysfunction: Patient will discuss with her PCP about referring to nephrology.  Follow-up in 1 year to reassess labs and overall health status.   Assessment & Plan Anemia of chronic kidney disease Hemoglobin improved to 10.5 g/dL. Iron supplementation and diet effective. WBC count at 2.8, no infections noted. - Continue leafy greens and iron supplementation. - Monitor hemoglobin annually.  Chronic kidney disease, unspecified Creatinine stable at 1.64 mg/dL. Fluctuation cause unclear, no NSAIDs or antihypertensives used. - Discuss kidney function with primary care physician. - Consider nephrology referral if necessary.      No orders of the defined types were placed in this encounter.  The patient has a good understanding of the overall plan. she agrees with it. she will call with any problems that may develop before the next visit here.  I personally spent a total of 30 minutes in the care of the patient today including preparing to see the patient, getting/reviewing separately obtained history, performing a medically appropriate exam/evaluation, counseling and educating, placing orders, referring and communicating with other health care professionals, documenting clinical information in the EHR, independently interpreting results, communicating results, and coordinating care.   Viinay K Abdoul Encinas, MD 11/25/23

## 2023-11-25 NOTE — Assessment & Plan Note (Signed)
 Previous iron infusion: June 2018 ESA therapy: Previously received Aranesp  2-3 times per year from 2014 (with Dr. Timmy) Current treatment: Observation goal: Hemoglobin of 10 or above Last Retacrit  10/29/2019   Bone marrow biopsy performed because of anemia and leukopenia: Normocellular bone marrow with trilineage hematopoiesis with mild megakaryocytic atypia   Lab review:   07/21/2019: Hemoglobin 9.2, WBC was 5.2 and platelet count 203 09/01/2019: Hb 9, WBC 2.2 ANC 1.2 12/31/2019: Hemoglobin 9.1 (Retacrit  was not given because of insurance issues) 02/17/2020: Hemoglobin 9.3 05/17/20: Hemoglobin 10.1, ANC 1.3 11/08/2020: Hemoglobin 9.2 11/08/2021: Hemoglobin 9.7 05/13/2022: Hemoglobin 9.3 11/12/2022: Hemoglobin 9.2, ANC 1.3, WBC 2.8 05/26/2023: Hemoglobin 8.8, ANC 2.3, WBC 3.5   She discontinued hydroxychloroquine in October 2024.  This was being given at [facility]. In spite of her hemoglobin being 8.8 she is completely asymptomatic.  She tells me that she feels great and that she is retiring from her job and is looking forward to her retirement.   Follow-up in 6 months to reassess labs and overall health status.

## 2023-12-27 ENCOUNTER — Other Ambulatory Visit: Payer: Self-pay | Admitting: Family Medicine

## 2023-12-30 ENCOUNTER — Encounter: Payer: Self-pay | Admitting: Family

## 2024-01-07 ENCOUNTER — Ambulatory Visit: Admitting: Podiatry

## 2024-01-07 ENCOUNTER — Encounter: Payer: Self-pay | Admitting: Podiatry

## 2024-01-07 DIAGNOSIS — D2371 Other benign neoplasm of skin of right lower limb, including hip: Secondary | ICD-10-CM | POA: Diagnosis not present

## 2024-01-07 NOTE — Progress Notes (Signed)
  Subjective:  Patient ID: Karen [name], female    DOB: 03-31-1960,   MRN: [id number]  Chief Complaint  Patient presents with   Callouses    I have a callus that keeps coming back every four to six weeks.  I been to several places.  I just want to find out if I can do something to fix it.  I am tired of dealing with it. I'm tired of hurting. (Plantar lateral right)    63 y.o. female presents for concern as above. She has seen several doctors and it keeps coming back and very painful. Has limited her activity.  . Denies any other pedal complaints. Denies n/v/f/c.   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 CFT <3 seconds. Normal hair growth on digits. No edema.  Skin. No lacerations or abrasions bilateral feet. Hyperkeratotic cored lesion noted sub fifth metatarsal base on right foot.   Musculoskeletal: MMT 5/5 bilateral lower extremities in DF, PF, Inversion and Eversion. Deceased ROM in DF of ankle joint.  Neurological: Sensation intact to light touch.   Assessment:   1. Benign neoplasm of skin of right foot      Plan:  Patient was evaluated and treated and all questions answered. -Discussed benign skin lesions with patient and treatment options.  -Hyperkeratotic tissue was debrided with chisel without incident.  -Applied salycylic acid treatment to area with dressing. Advised to remove bandaging tomorrow.  -Encouraged daily moisturizing -Discussed use of pumice stone -Advised good supportive shoes and inserts -Discussed trying procedure to remove the lesion with punch biopsy. Patient is in agreement with this plan no guarnatee made.   -Patient to return to office in 3 weeks for procedure.    Asberry Failing, DPM

## 2024-01-27 ENCOUNTER — Ambulatory Visit: Admitting: Podiatry

## 2024-02-09 ENCOUNTER — Encounter: Payer: Self-pay | Admitting: Family Medicine

## 2024-02-09 ENCOUNTER — Ambulatory Visit (INDEPENDENT_AMBULATORY_CARE_PROVIDER_SITE_OTHER): Admitting: Family Medicine

## 2024-02-09 ENCOUNTER — Ambulatory Visit: Payer: Self-pay | Admitting: Family Medicine

## 2024-02-09 VITALS — BP 118/78 | HR 71 | Temp 97.7°F | Resp 12 | Ht 69.5 in | Wt 181.2 lb

## 2024-02-09 DIAGNOSIS — N1831 Chronic kidney disease, stage 3a: Secondary | ICD-10-CM

## 2024-02-09 DIAGNOSIS — E039 Hypothyroidism, unspecified: Secondary | ICD-10-CM

## 2024-02-09 DIAGNOSIS — I10 Essential (primary) hypertension: Secondary | ICD-10-CM | POA: Diagnosis not present

## 2024-02-09 DIAGNOSIS — Z0000 Encounter for general adult medical examination without abnormal findings: Secondary | ICD-10-CM

## 2024-02-09 DIAGNOSIS — E785 Hyperlipidemia, unspecified: Secondary | ICD-10-CM

## 2024-02-09 DIAGNOSIS — E559 Vitamin D deficiency, unspecified: Secondary | ICD-10-CM

## 2024-02-09 LAB — LIPID PANEL
Cholesterol: 162 mg/dL (ref 28–200)
HDL: 55.8 mg/dL
LDL Cholesterol: 93 mg/dL (ref 10–99)
NonHDL: 106.69
Total CHOL/HDL Ratio: 3
Triglycerides: 70 mg/dL (ref 10.0–149.0)
VLDL: 14 mg/dL (ref 0.0–40.0)

## 2024-02-09 LAB — BASIC METABOLIC PANEL WITH GFR
BUN: 14 mg/dL (ref 6–23)
CO2: 28 meq/L (ref 19–32)
Calcium: 9.4 mg/dL (ref 8.4–10.5)
Chloride: 101 meq/L (ref 96–112)
Creatinine, Ser: 1.14 mg/dL (ref 0.40–1.20)
GFR: 50.8 mL/min — ABNORMAL LOW
Glucose, Bld: 86 mg/dL (ref 70–99)
Potassium: 4.1 meq/L (ref 3.5–5.1)
Sodium: 137 meq/L (ref 135–145)

## 2024-02-09 LAB — VITAMIN D 25 HYDROXY (VIT D DEFICIENCY, FRACTURES): VITD: 44.84 ng/mL (ref 30.00–100.00)

## 2024-02-09 LAB — T4, FREE: Free T4: 0.93 ng/dL (ref 0.60–1.60)

## 2024-02-09 LAB — TSH: TSH: 0.9 u[IU]/mL (ref 0.35–5.50)

## 2024-02-09 NOTE — Assessment & Plan Note (Addendum)
 Otherwise problem has been stable, Cr 1.0-1.6 and e GFR 35 for the past 1-2 years. According to patient, her oncology has been concerned about renal function.  She is not interested in consultation with nephrologist at this time but will be recommended if eGFR declines. Continue adequate hydration, avoidance of NSAID's, and low-salt diet to prevent progression.

## 2024-02-09 NOTE — Patient Instructions (Addendum)
 A few things to remember from today's visit:  Routine general medical examination at a health care facility  Hypothyroidism, unspecified type  Dyslipidemia (high LDL; low HDL)  Hypertension, essential, benign  Stage 3a chronic kidney disease (HCC)  If you need refills for medications you take chronically, please call your pharmacy. Do not use My Chart to request refills or for acute issues that need immediate attention. If you send a my chart message, it may take a few days to be addressed, specially if I am not in the office.  Please be sure medication list is accurate. If a new problem present, please set up appointment sooner than planned today.  Health Maintenance, Female Adopting a healthy lifestyle and getting preventive care are important in promoting health and wellness. Ask your health care provider about: The right schedule for you to have regular tests and exams. Things you can do on your own to prevent diseases and keep yourself healthy. What should I know about diet, weight, and exercise? Eat a healthy diet  Eat a diet that includes plenty of vegetables, fruits, low-fat dairy products, and lean protein. Do not eat a lot of foods that are high in solid fats, added sugars, or sodium. Maintain a healthy weight Body mass index (BMI) is used to identify weight problems. It estimates body fat based on height and weight. Your health care provider can help determine your BMI and help you achieve or maintain a healthy weight. Get regular exercise Get regular exercise. This is one of the most important things you can do for your health. Most adults should: Exercise for at least 150 minutes each week. The exercise should increase your heart rate and make you sweat (moderate-intensity exercise). Do strengthening exercises at least twice a week. This is in addition to the moderate-intensity exercise. Spend less time sitting. Even light physical activity can be beneficial. Watch  cholesterol and blood lipids Have your blood tested for lipids and cholesterol at 65 years of age, then have this test every 5 years. Have your cholesterol levels checked more often if: Your lipid or cholesterol levels are high. You are older than 65 years of age. You are at high risk for heart disease. What should I know about cancer screening? Depending on your health history and family history, you may need to have cancer screening at various ages. This may include screening for: Breast cancer. Cervical cancer. Colorectal cancer. Skin cancer. Lung cancer. What should I know about heart disease, diabetes, and high blood pressure? Blood pressure and heart disease High blood pressure causes heart disease and increases the risk of stroke. This is more likely to develop in people who have high blood pressure readings or are overweight. Have your blood pressure checked: Every 3-5 years if you are 75-60 years of age. Every year if you are 80 years old or older. Diabetes Have regular diabetes screenings. This checks your fasting blood sugar level. Have the screening done: Once every three years after age 80 if you are at a normal weight and have a low risk for diabetes. More often and at a younger age if you are overweight or have a high risk for diabetes. What should I know about preventing infection? Hepatitis B If you have a higher risk for hepatitis B, you should be screened for this virus. Talk with your health care provider to find out if you are at risk for hepatitis B infection. Hepatitis C Testing is recommended for: Everyone born from [birth year] through 1966.  Anyone with known risk factors for hepatitis C. Sexually transmitted infections (STIs) Get screened for STIs, including gonorrhea and chlamydia, if: You are sexually active and are younger than 65 years of age. You are older than 65 years of age and your health care provider tells you that you are at risk for this type of  infection. Your sexual activity has changed since you were last screened, and you are at increased risk for chlamydia or gonorrhea. Ask your health care provider if you are at risk. Ask your health care provider about whether you are at high risk for HIV. Your health care provider may recommend a prescription medicine to help prevent HIV infection. If you choose to take medicine to prevent HIV, you should first get tested for HIV. You should then be tested every 3 months for as long as you are taking the medicine. Pregnancy If you are about to stop having your period (premenopausal) and you may become pregnant, seek counseling before you get pregnant. Take 400 to 800 micrograms (mcg) of folic acid every day if you become pregnant. Ask for birth control (contraception) if you want to prevent pregnancy. Osteoporosis and menopause Osteoporosis is a disease in which the bones lose minerals and strength with aging. This can result in bone fractures. If you are 43 years old or older, or if you are at risk for osteoporosis and fractures, ask your health care provider if you should: Be screened for bone loss. Take a calcium or vitamin D  supplement to lower your risk of fractures. Be given hormone replacement therapy (HRT) to treat symptoms of menopause. Follow these instructions at home: Alcohol use Do not drink alcohol if: Your health care provider tells you not to drink. You are pregnant, may be pregnant, or are planning to become pregnant. If you drink alcohol: Limit how much you have to: 0-1 drink a day. Know how much alcohol is in your drink. In the [place] one drink equals one 12 oz bottle of beer (355 mL), one 5 oz glass of wine (148 mL), or one 1 oz glass of hard liquor (44 mL). Lifestyle Do not use any products that contain nicotine or tobacco. These products include cigarettes, chewing tobacco, and vaping devices, such as e-cigarettes. If you need help quitting, ask your health care  provider. Do not use street drugs. Do not share needles. Ask your health care provider for help if you need support or information about quitting drugs. General instructions Schedule regular health, dental, and eye exams. Stay current with your vaccines. Tell your health care provider if: You often feel depressed. You have ever been abused or do not feel safe at home. Summary Adopting a healthy lifestyle and getting preventive care are important in promoting health and wellness. Follow your health care provider's instructions about healthy diet, exercising, and getting tested or screened for diseases. Follow your health care provider's instructions on monitoring your cholesterol and blood pressure. This information is not intended to replace advice given to you by your health care provider. Make sure you discuss any questions you have with your health care provider. Document Revised: 06/12/2020 Document Reviewed: 06/12/2020 Elsevier Patient Education  2024 [employer].

## 2024-02-09 NOTE — Assessment & Plan Note (Signed)
 Currently on levothyroxine  88 mcg daily. Further recommendation will be given according to TSI result.

## 2024-02-09 NOTE — Assessment & Plan Note (Signed)
Continue ergocalciferol 50,000 units every 2 weeks. Further recommendation will be given according to 25 OH vitamin D result. 

## 2024-02-09 NOTE — Assessment & Plan Note (Signed)
 Continue nonpharmacologic treatment. Further recommendation will be given according to lipid panel result.

## 2024-02-09 NOTE — Assessment & Plan Note (Signed)
 BP has been adequately controlled on nonpharmacologic treatment. Continue low-salt diet.

## 2024-02-09 NOTE — Assessment & Plan Note (Addendum)
 We discussed the importance of regular physical activity and healthy diet for prevention of chronic illness and/or complications. Preventive guidelines reviewed. Vaccination up to date. Pap smear done in 01/2023. DEXA on 07/19/2022 showed osteopenia. Next CPE in a year.

## 2024-02-09 NOTE — Progress Notes (Signed)
 "   Chief Complaint  Patient presents with   Annual Exam   Discussed the use of AI scribe software for clinical note transcription with the patient, who gave verbal consent to proceed.  History of Present Illness Karen [name] is a 64 year old femalewith a PMHx significant for iron deficiency anemia, CKD III, SLE, depression, vitamin D  deficiency, hypothyroidism, and HLD, who is here today for her routine physical. Last CPE 02/03/23.  She does not exercise regularly but she is active though work. She cooks her meals at home, loves vegetables. She does not consume alcohol or smoke.  She retired from one of her jobs in June 2025. She sleeps from 11 PM to 5 AM.  Immunization History  Administered Date(s) Administered   Influenza Split 11/06/2010, 12/18/2011   Influenza Whole 10/22/2007, 10/28/2008, 11/02/2009   Influenza,inj,Quad PF,6+ Mos 11/25/2012, 12/15/2013, 10/11/2015, 11/22/2016, 11/24/2017, 11/24/2018, 11/22/2019, 12/27/2020   PFIZER(Purple Top)SARS-COV-2 Vaccination 04/03/2019, 04/24/2019, 11/26/2019, 05/27/2020   PPD Test 08/22/2014   Pfizer Covid-19 Vaccine Bivalent Booster 28yrs & up 10/28/2020   Pfizer(Comirnaty)Fall Seasonal Vaccine 12 years and older 10/21/2023   Pneumococcal Polysaccharide-23 11/22/2016   Td 02/04/1998, 10/28/2008   Tdap 11/24/2018   Zoster Recombinant(Shingrix ) 11/24/2017, 01/23/2018, 11/22/2019   Health Maintenance  Topic Date Due   COVID-19 Vaccine (7 - Pfizer risk 2025-26 season) 04/19/2024   Mammogram  11/23/2025   Colonoscopy  09/18/2026   Cervical Cancer Screening (HPV/Pap Cotest)  02/03/2028   DTaP/Tdap/Td (4 - Td or Tdap) 11/23/2028   Pneumococcal Vaccine: 50+ Years  Completed   Influenza Vaccine  Completed   Hepatitis C Screening  Completed   HIV Screening  Completed   Zoster Vaccines- Shingrix   Completed   Hepatitis B Vaccines 19-59 Average Risk  Aged Out   HPV VACCINES  Aged Out   Meningococcal B Vaccine  Aged Out   02/03/2023   PAP [[place]]  HIGH RISK HPV ([place]): Negative ADEQUACY: Satisfactory for evaluation. The presence or absence of an ADEQUACY: endocervical/transformation zone component cannot be determined because ADEQUACY: of atrophy. DIAGNOSIS: - Negative for intraepithelial lesion or malignancy (NILM) COMMENT (MOLECULAR): Normal Reference Range HPV - Negative   DEXA on 07/19/22:Assessment: By the [facility] Criteria for diagnosis based on bone density, this patient has Low Bone Density .    FRAX 10-year fracture risk calculator: 5.6 % for any major fracture and 0.4 % for hip fracture. She is currently taking vitamin D  supplementation and calcium through her diet.  Dyslipidemia:She is on non pharmacologic treatment.  Lab Results  Component Value Date   CHOL 198 02/03/2023   HDL 66.10 02/03/2023   LDLCALC 119 (H) 02/03/2023   LDLDIRECT 148.9 10/25/2010   TRIG 63.0 02/03/2023   CHOLHDL 3 02/03/2023   Anxiety/depression: Currently on bupropion  150 mg daily and risperidone 1 mg daily.  She follows with psychiatrist.  Hypothyroidism: Currently on levothyroxine  88 mcg daily.  Lab Results  Component Value Date   TSH 1.07 07/04/2023   Vitamin D  deficiency: She takes Ergocalciferol  50000 units every two weeks.   Lab Results  Component Value Date   VD25OH 34.20 02/03/2023  She is not taking calcium supplementation.   CKD III: Negative for gross hematuria or foam in urine.Cr has fluctuated between 1.0 to 1.6 and e GFR in the mid 30's.  HTN on nonpharmacologic treatment.  Lab Results  Component Value Date   NA 138 11/25/2023   CL 105 11/25/2023   K 4.9 11/25/2023   CO2 27 11/25/2023  BUN 17 11/25/2023   CREATININE 1.64 (H) 11/25/2023   GFRNONAA 35 (L) 11/25/2023   CALCIUM 10.2 11/25/2023   ALBUMIN 4.3 11/25/2023   GLUCOSE 124 (H) 11/25/2023   Chronic anemia:Follows with Dr Odean. Reports that H/H improved with increasing intake of vegetables/greens. Per records: Bone marrow biopsy  showed normocellular bone marrow with trilineage hematopoiesis with mild megakaryocytic atypia  Lab Results  Component Value Date   WBC 2.8 (L) 11/25/2023   HGB 10.5 (L) 11/25/2023   HCT 32.7 (L) 11/25/2023   MCV 91.1 11/25/2023   PLT 209 11/25/2023   Review of Systems  Constitutional:  Negative for activity change, appetite change, chills and fever.  HENT:  Negative for mouth sores, sore throat and trouble swallowing.   Eyes:  Negative for redness and visual disturbance.  Respiratory:  Negative for cough, shortness of breath and wheezing.   Cardiovascular:  Negative for chest pain and leg swelling.  Gastrointestinal:  Negative for abdominal pain, nausea and vomiting.  Endocrine: Negative for cold intolerance, heat intolerance, polydipsia, polyphagia and polyuria.  Genitourinary:  Negative for decreased urine volume and dysuria.  Musculoskeletal:  Negative for gait problem and myalgias.  Skin:  Negative for color change and rash.  Allergic/Immunologic: Positive for environmental allergies.  Neurological:  Negative for syncope, weakness and headaches.  Hematological:  Negative for adenopathy. Does not bruise/bleed easily.  Psychiatric/Behavioral:  Negative for confusion and hallucinations.   All other systems reviewed and are negative.  Current Outpatient Medications on File Prior to Visit  Medication Sig Dispense Refill   buPROPion  (WELLBUTRIN  XL) 150 MG 24 hr tablet Take 150 mg by mouth daily.     fluticasone  (FLONASE ) 50 MCG/ACT nasal spray SPRAY 2 SPRAYS INTO EACH NOSTRIL EVERY DAY 48 mL 1   FOLIC ACID PO Take by mouth daily.     levothyroxine  (SYNTHROID ) 88 MCG tablet TAKE 1 TABLET BY MOUTH EVERY DAY *STOP 90 tablet 2   omeprazole  (PRILOSEC ) 20 MG capsule TAKE 1 CAPSULE BY MOUTH EVERY DAY 90 capsule 2   risperiDONE (RISPERDAL) 1 MG tablet Take 1 mg by mouth at bedtime.  1   Vitamin D , Ergocalciferol , (DRISDOL ) 1.25 MG (50000 UNIT) CAPS capsule TAKE 1 CAPSULE BY MOUTH  EVERY 14 DAYS. 6 capsule 2   No current facility-administered medications on file prior to visit.   Past Medical History:  Diagnosis Date   Anemia    IDA-not taking meds at this time due to insurance   Barrett esophagus    Depression    hx of   DUB (dysfunctional uterine bleeding)    GERD (gastroesophageal reflux disease)    on meds   Graves disease    Hypertension    on meds   Hypothyroidism    on meds    Past Surgical History:  Procedure Laterality Date   bunionectomy Left    CHOLECYSTECTOMY N/A 07/16/2012   Procedure: LAPAROSCOPIC CHOLECYSTECTOMY WITH INTRAOPERATIVE CHOLANGIOGRAM;  Surgeon: Debby LABOR. Cornett, MD;  Location: [facility] OR;  Service: General;  Laterality: N/A;   COLONOSCOPY  2011   MS-F/V-moveiprep(food)-polypoid   UPPER GASTROINTESTINAL ENDOSCOPY  2019   MS-MAC-recall 3 yrs    Allergies  Allergen Reactions   Milk-Related Compounds Other (See Comments)    Intolerance to milk products-causes gas    Family History  Problem Relation Age of Onset   Hypertension Mother    Asthma Mother    Cancer Father 70       larynx cancer    Breast  cancer Maternal Aunt 60   Colon cancer Neg Hx    Colon polyps Neg Hx    Esophageal cancer Neg Hx    Rectal cancer Neg Hx    Stomach cancer Neg Hx     Social History   Socioeconomic History   Marital status: Married    Spouse name: Not on file   Number of children: 0   Years of education: Not on file   Highest education level: Not on file  Occupational History    Employer: [employer]  Tobacco Use   Smoking status: Former    Current packs/day: 0.00    Average packs/day: 1 pack/day for 20.3 years (20.3 ttl pk-yrs)    Types: Cigarettes    Start date: 03/13/1967    Quit date: 07/14/1987    Years since quitting: 36.6   Smokeless tobacco: Never   Tobacco comments:    quit 26 years ago  Vaping Use   Vaping status: Never Used  Substance and Sexual Activity   Alcohol use: No    Alcohol/week: 0.0 standard drinks of alcohol    Drug use: No   Sexual activity: Not on file  Other Topics Concern   Not on file  Social History Narrative   Occupation: Estate agent   Regular exercise- yes   0 caffeine drinks    Social Drivers of Health   Tobacco Use: Medium Risk (02/09/2024)   Patient History    Smoking Tobacco Use: Former    Smokeless Tobacco Use: Never    Passive Exposure: Not on Actuary Strain: Not on file  Food Insecurity: Not on file  Transportation Needs: Not on file  Physical Activity: Not on file  Stress: Not on file  Social Connections: Unknown (06/19/2021)   [facility]   Social Network    Social Network: Not on file  Depression (PHQ2-9): Low Risk (02/09/2024)   Depression (PHQ2-9)    PHQ-2 Score: 0  Alcohol Screen: Not on file  Housing: Not on file  Utilities: Not on file  Health Literacy: Not on file   Vitals:   02/09/24 1352  BP: 118/78  Pulse: 71  Resp: 12  Temp: 97.7 F (36.5 C)  SpO2: 96%   Body mass index is 26.37 kg/m.  Wt Readings from Last 3 Encounters:  02/09/24 181 lb 3.2 oz (82.2 kg)  11/25/23 176 lb 14.4 oz (80.2 kg)  09/18/23 170 lb (77.1 kg)   Physical Exam Vitals and nursing note reviewed.  Constitutional:      General: She is not in acute distress.    Appearance: She is well-developed.  HENT:     Head: Normocephalic and atraumatic.     Right Ear: Tympanic membrane, ear canal and external ear normal.     Left Ear: Tympanic membrane, ear canal and external ear normal.     Mouth/Throat:     Mouth: Mucous membranes are moist.     Pharynx: Oropharynx is clear. Uvula midline.  Eyes:     Extraocular Movements: Extraocular movements intact.     Conjunctiva/sclera: Conjunctivae normal.     Pupils: Pupils are equal, round, and reactive to light.  Neck:     Thyroid : No thyroid  mass or thyromegaly (palpable, no masses).  Cardiovascular:     Rate and Rhythm: Normal rate and regular rhythm.     Pulses:          Dorsalis pedis  pulses are 2+ on the right side and 2+ on the  left side.     Heart sounds: No murmur heard. Pulmonary:     Effort: Pulmonary effort is normal. No respiratory distress.     Breath sounds: Normal breath sounds.  Abdominal:     Palpations: Abdomen is soft. There is no hepatomegaly or mass.     Tenderness: There is no abdominal tenderness.  Genitourinary:    Comments: No concerns today. Musculoskeletal:     Right lower leg: No edema.     Left lower leg: No edema.     Comments: No signs of synovitis appreciated.  Lymphadenopathy:     Cervical: No cervical adenopathy.     Upper Body:     Right upper body: No supraclavicular adenopathy.     Left upper body: No supraclavicular adenopathy.  Skin:    General: Skin is warm.     Findings: No erythema or rash.  Neurological:     General: No focal deficit present.     Mental Status: She is alert and oriented to person, place, and time.     Cranial Nerves: No cranial nerve deficit.     Sensory: No sensory deficit.     Motor: No weakness.     Coordination: Coordination normal.     Gait: Gait normal.     Deep Tendon Reflexes:     Reflex Scores:      Bicep reflexes are 2+ on the right side and 2+ on the left side.      Patellar reflexes are 2+ on the right side and 2+ on the left side. Psychiatric:        Mood and Affect: Mood and affect normal.    ASSESSMENT AND PLAN:  Karen [name] was here today for her annual physical examination.  Orders Placed This Encounter  Procedures   Basic metabolic panel with GFR   Lipid panel   VITAMIN D  25 Hydroxy (Vit-D Deficiency, Fractures)   T4, free   TSH   Lab Results  Component Value Date   TSH 0.90 02/09/2024   Lab Results  Component Value Date   CHOL 162 02/09/2024   HDL 55.80 02/09/2024   LDLCALC 93 02/09/2024   LDLDIRECT 148.9 10/25/2010   TRIG 70.0 02/09/2024   CHOLHDL 3 02/09/2024   Lab Results  Component Value Date   VD25OH 44.84 02/09/2024   Lab Results  Component  Value Date   NA 137 02/09/2024   CL 101 02/09/2024   K 4.1 02/09/2024   CO2 28 02/09/2024   BUN 14 02/09/2024   CREATININE 1.14 02/09/2024   GFR 50.80 (L) 02/09/2024   CALCIUM 9.4 02/09/2024   ALBUMIN 4.3 11/25/2023   GLUCOSE 86 02/09/2024   Routine general medical examination at a health care facility Assessment & Plan: We discussed the importance of regular physical activity and healthy diet for prevention of chronic illness and/or complications. Preventive guidelines reviewed. Vaccination up to date. Pap smear done in 01/2023. DEXA on 07/19/2022 showed osteopenia. Next CPE in a year.   Hypothyroidism, unspecified type Assessment & Plan: Currently on levothyroxine  88 mcg daily. Further recommendation will be given according to TSI result.  Orders: -     T4, free; Future -     TSH; Future  Dyslipidemia (high LDL; low HDL) Assessment & Plan: Continue nonpharmacologic treatment. Further recommendation will be given according to lipid panel result.  Orders: -     Lipid panel; Future  Hypertension, essential, benign Assessment & Plan: BP has been adequately controlled on nonpharmacologic  treatment. Continue low-salt diet.  Orders: -     Basic metabolic panel with GFR; Future  Stage 3a chronic kidney disease (HCC) Assessment & Plan: Otherwise problem has been stable, Cr 1.0-1.6 and e GFR 35 for the past 1-2 years. According to patient, her oncology has been concerned about renal function.  She is not interested in consultation with nephrologist at this time but will be recommended if eGFR declines. Continue adequate hydration, avoidance of NSAID's, and low-salt diet to prevent progression.   Vitamin D  deficiency, unspecified Assessment & Plan: Continue ergocalciferol  50,000 units every 2 weeks. Further recommendation will be given according to 25 [place] vitamin D  result.  Orders: -     VITAMIN D  25 Hydroxy (Vit-D Deficiency, Fractures); Future  Return in about 6  months (around 08/08/2024) for chronic problems.  Kenora Spayd G. Addley Ballinger, MD  [facility]. [place] office. "

## 2024-02-10 NOTE — Telephone Encounter (Signed)
 Labs have been mailed.

## 2024-02-11 ENCOUNTER — Ambulatory Visit: Admitting: Podiatry

## 2024-02-11 ENCOUNTER — Encounter: Payer: Self-pay | Admitting: Podiatry

## 2024-02-11 DIAGNOSIS — D2371 Other benign neoplasm of skin of right lower limb, including hip: Secondary | ICD-10-CM | POA: Diagnosis not present

## 2024-02-11 DIAGNOSIS — D2372 Other benign neoplasm of skin of left lower limb, including hip: Secondary | ICD-10-CM

## 2024-02-11 NOTE — Progress Notes (Signed)
"  °  Subjective:  Patient ID: Karen [name], female    DOB: 1960-10-02,   MRN: [id number]  Chief Complaint  Patient presents with   Callouses    It's back.  It's both feet.    64 y.o. female presents for concern as above. She has seen several doctors and it keeps coming back and very painful. Has limited her activity.  Here for attempted procedure removal. . Denies any other pedal complaints. Denies n/v/f/c.   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 CFT <3 seconds. Normal hair growth on digits. No edema.  Skin. No lacerations or abrasions bilateral feet. Hyperkeratotic cored lesion noted sub fifth metatarsal base on right foot.   Musculoskeletal: MMT 5/5 bilateral lower extremities in DF, PF, Inversion and Eversion. Deceased ROM in DF of ankle joint.  Neurological: Sensation intact to light touch.   Assessment:   1. Benign neoplasm of skin of right foot   2. Benign neoplasm of skin of foot, left       Plan:  Patient was evaluated and treated and all questions answered. -Discussed benign skin lesions with patient and treatment options.  -Hyperkeratotic tissue removed with punch biopsy.  Procedure below. -Patient to return to office in 1 week for recheck  Discussed ingrown toenails etiology and treatment options including procedure for removal vs conservative care.  Patient requesting removal of ingrown nail today. Procedure below.  Discussed procedure and post procedure care and patient expressed understanding. No guarantees given.  Will follow-up in 2 weeks for nail check or sooner if any problems arise.    Procedure:  Procedure:  Removal of benign lesion of right foot Surgeon: Asberry Failing, DPM  Pre-op Dx: Painful benign lesion Post-op: Same  Place of Surgery: Office exam room.  Indications for surgery: Painful recalcitrant right foot lesion   The patient is requesting removal of right foot lesion with punch biopsy risks and complications were discussed with the patient for which they understand and written consent was obtained. Under sterile conditions a total of 3 mL of  1% lidocaine  plain was infiltrated in the area of the lesion once anesthetized, the skin was prepped in sterile fashion.  A  4 mm punch biopsy was then used to remove lesion in total. Addiotnal lesion just proximal also removed with 4 mm punch.   Area copiously irrigated with alcohol.  Silvadene was applied. A dry sterile dressing was applied.  The patient tolerated the procedure well without any complications. Post procedure instructions were discussed the patient for which he verbally understood. Follow-up in on week for  check or sooner if any problems are to arise. Discussed signs/symptoms of infection and directed to call the office immediately should any occur or go directly to the emergency room. In the meantime, encouraged to call the office with any questions, concerns, changes symptoms.    Asberry Failing, DPM    "

## 2024-02-18 ENCOUNTER — Encounter: Payer: Self-pay | Admitting: Podiatry

## 2024-02-18 ENCOUNTER — Ambulatory Visit: Admitting: Podiatry

## 2024-02-18 DIAGNOSIS — D2371 Other benign neoplasm of skin of right lower limb, including hip: Secondary | ICD-10-CM

## 2024-02-18 NOTE — Progress Notes (Signed)
"  °  Subjective:  Patient ID: Karen [name], female    DOB: May 18, 1960,   MRN: [id number]  Chief Complaint  Patient presents with   Routine Post Op    POV#1 DOS 02/11/2024 REMOVAL SKIN LESION RIGHT It's still a little sore when I put pressure on it.  I been taking Tylenol .  My pain level is about a five when I get up and start walking on it.  Once I'm up on it, I'm good.    64 y.o. female presents for follow-up after punch biopsy procedure. Relates still sore and has been taking tylenol  which helps.  Denies any other pedal complaints. Denies n/v/f/c.   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 CFT <3 seconds. Normal hair growth on digits. No edema.  Skin. No lacerations or abrasions bilateral feet. Plantar right foot punch incsions healing well. No erythema edema or purulence noted.  Musculoskeletal: MMT 5/5 bilateral lower extremities in DF, PF, Inversion and Eversion. Deceased ROM in DF of ankle joint.  Neurological: Sensation intact to light touch.   Assessment:   1. Benign neoplasm of skin of right foot        Plan:  Patient was evaluated and treated and all questions answered. -Discussed benign skin lesions with patient and treatment options.  -Doing well post procedure.  -Advised to keep bandage and neosporin on until drainage stops.  -Continue tyelnol as needed.  -Patient to return to office as needed      Asberry Failing, DPM    "

## 2024-11-24 ENCOUNTER — Inpatient Hospital Stay

## 2024-11-24 ENCOUNTER — Inpatient Hospital Stay: Admitting: Hematology and Oncology
